# Patient Record
Sex: Male | Born: 1950 | Race: White | Hispanic: No | Smoking: Current some day smoker
Health system: Southern US, Community
[De-identification: ages and names within clinical notes are randomized; demographics above are authoritative.]

## PROBLEM LIST (undated history)

## (undated) DIAGNOSIS — M545 Low back pain, unspecified: Secondary | ICD-10-CM

## (undated) DIAGNOSIS — I2510 Atherosclerotic heart disease of native coronary artery without angina pectoris: Secondary | ICD-10-CM

## (undated) DIAGNOSIS — E7800 Pure hypercholesterolemia, unspecified: Secondary | ICD-10-CM

## (undated) DIAGNOSIS — N390 Urinary tract infection, site not specified: Secondary | ICD-10-CM

## (undated) DIAGNOSIS — E663 Overweight: Secondary | ICD-10-CM

## (undated) DIAGNOSIS — M25569 Pain in unspecified knee: Secondary | ICD-10-CM

## (undated) DIAGNOSIS — G4730 Sleep apnea, unspecified: Secondary | ICD-10-CM

## (undated) HISTORY — DX: Sleep apnea, unspecified: G47.30

## (undated) HISTORY — DX: Pain in unspecified knee: M25.569

## (undated) HISTORY — DX: Pure hypercholesterolemia, unspecified: E78.00

## (undated) HISTORY — DX: Urinary tract infection, site not specified: N39.0

## (undated) HISTORY — DX: Low back pain, unspecified: M54.50

## (undated) HISTORY — DX: Overweight: E66.3

## (undated) HISTORY — DX: Atherosclerotic heart disease of native coronary artery without angina pectoris: I25.10

## (undated) HISTORY — DX: Low back pain: M54.5

---

## 2017-07-15 DIAGNOSIS — E7800 Pure hypercholesterolemia, unspecified: Secondary | ICD-10-CM | POA: Diagnosis not present

## 2017-07-15 DIAGNOSIS — Z23 Encounter for immunization: Secondary | ICD-10-CM | POA: Diagnosis not present

## 2017-07-15 DIAGNOSIS — R0789 Other chest pain: Secondary | ICD-10-CM | POA: Diagnosis not present

## 2017-07-15 DIAGNOSIS — G4730 Sleep apnea, unspecified: Secondary | ICD-10-CM | POA: Diagnosis not present

## 2017-07-15 DIAGNOSIS — M545 Low back pain: Secondary | ICD-10-CM | POA: Diagnosis not present

## 2017-07-15 DIAGNOSIS — M542 Cervicalgia: Secondary | ICD-10-CM | POA: Diagnosis not present

## 2017-07-15 DIAGNOSIS — F17210 Nicotine dependence, cigarettes, uncomplicated: Secondary | ICD-10-CM | POA: Diagnosis not present

## 2017-07-15 DIAGNOSIS — E663 Overweight: Secondary | ICD-10-CM | POA: Diagnosis not present

## 2017-07-15 DIAGNOSIS — I2510 Atherosclerotic heart disease of native coronary artery without angina pectoris: Secondary | ICD-10-CM | POA: Diagnosis not present

## 2017-09-13 DIAGNOSIS — R112 Nausea with vomiting, unspecified: Secondary | ICD-10-CM | POA: Diagnosis not present

## 2017-09-13 DIAGNOSIS — R319 Hematuria, unspecified: Secondary | ICD-10-CM | POA: Diagnosis not present

## 2017-09-13 DIAGNOSIS — Z7982 Long term (current) use of aspirin: Secondary | ICD-10-CM | POA: Diagnosis not present

## 2017-09-13 DIAGNOSIS — Z9049 Acquired absence of other specified parts of digestive tract: Secondary | ICD-10-CM | POA: Diagnosis not present

## 2017-09-13 DIAGNOSIS — Z79899 Other long term (current) drug therapy: Secondary | ICD-10-CM | POA: Diagnosis not present

## 2017-09-13 DIAGNOSIS — K649 Unspecified hemorrhoids: Secondary | ICD-10-CM | POA: Diagnosis not present

## 2017-09-13 DIAGNOSIS — I2510 Atherosclerotic heart disease of native coronary artery without angina pectoris: Secondary | ICD-10-CM | POA: Diagnosis not present

## 2017-09-13 DIAGNOSIS — R1012 Left upper quadrant pain: Secondary | ICD-10-CM | POA: Diagnosis not present

## 2017-09-13 DIAGNOSIS — R3129 Other microscopic hematuria: Secondary | ICD-10-CM | POA: Diagnosis not present

## 2017-09-13 DIAGNOSIS — I252 Old myocardial infarction: Secondary | ICD-10-CM | POA: Diagnosis not present

## 2017-09-13 DIAGNOSIS — R197 Diarrhea, unspecified: Secondary | ICD-10-CM | POA: Diagnosis not present

## 2017-09-13 DIAGNOSIS — F17210 Nicotine dependence, cigarettes, uncomplicated: Secondary | ICD-10-CM | POA: Diagnosis not present

## 2017-09-30 ENCOUNTER — Other Ambulatory Visit: Payer: Self-pay

## 2017-09-30 NOTE — Patient Outreach (Signed)
[facility] Care Management  09/30/2017  Stephen Huerta Jan 07, 1951 [id number]   Telephone call to review health risk assessment and screen for care management needs for  [employer]. Member states he is still working and he is to see his primary care MD in March for a physical.  States he is still smoking but would like some information on the [employer] program. Plan to mail information sheet on [employer] program to member  Member assessed with no further case management needs identified. Dudley MajorMelissa Briona Korpela RN, [facility]N,CCM Care Management Coordinator [facility] Care [facility] [phone]

## 2017-10-01 DIAGNOSIS — N5201 Erectile dysfunction due to arterial insufficiency: Secondary | ICD-10-CM | POA: Diagnosis not present

## 2017-10-01 DIAGNOSIS — R3129 Other microscopic hematuria: Secondary | ICD-10-CM | POA: Diagnosis not present

## 2017-10-01 DIAGNOSIS — Z125 Encounter for screening for malignant neoplasm of prostate: Secondary | ICD-10-CM | POA: Diagnosis not present

## 2017-10-29 DIAGNOSIS — R3129 Other microscopic hematuria: Secondary | ICD-10-CM | POA: Diagnosis not present

## 2017-12-02 DIAGNOSIS — R351 Nocturia: Secondary | ICD-10-CM | POA: Insufficient documentation

## 2017-12-02 DIAGNOSIS — R3916 Straining to void: Secondary | ICD-10-CM | POA: Diagnosis not present

## 2017-12-02 DIAGNOSIS — R310 Gross hematuria: Secondary | ICD-10-CM | POA: Diagnosis not present

## 2018-01-02 DIAGNOSIS — I252 Old myocardial infarction: Secondary | ICD-10-CM | POA: Diagnosis not present

## 2018-01-02 DIAGNOSIS — N400 Enlarged prostate without lower urinary tract symptoms: Secondary | ICD-10-CM | POA: Diagnosis not present

## 2018-01-02 DIAGNOSIS — N50812 Left testicular pain: Secondary | ICD-10-CM | POA: Diagnosis not present

## 2018-01-02 DIAGNOSIS — G4730 Sleep apnea, unspecified: Secondary | ICD-10-CM | POA: Diagnosis not present

## 2018-01-02 DIAGNOSIS — F17210 Nicotine dependence, cigarettes, uncomplicated: Secondary | ICD-10-CM | POA: Diagnosis not present

## 2018-01-02 DIAGNOSIS — R9431 Abnormal electrocardiogram [ECG] [EKG]: Secondary | ICD-10-CM | POA: Diagnosis not present

## 2018-01-02 DIAGNOSIS — R1032 Left lower quadrant pain: Secondary | ICD-10-CM | POA: Diagnosis not present

## 2018-01-02 DIAGNOSIS — Z808 Family history of malignant neoplasm of other organs or systems: Secondary | ICD-10-CM | POA: Diagnosis not present

## 2018-01-02 DIAGNOSIS — Z9049 Acquired absence of other specified parts of digestive tract: Secondary | ICD-10-CM | POA: Diagnosis not present

## 2018-01-02 DIAGNOSIS — Z8349 Family history of other endocrine, nutritional and metabolic diseases: Secondary | ICD-10-CM | POA: Diagnosis not present

## 2018-01-02 DIAGNOSIS — Z8719 Personal history of other diseases of the digestive system: Secondary | ICD-10-CM | POA: Diagnosis not present

## 2018-01-02 DIAGNOSIS — Z833 Family history of diabetes mellitus: Secondary | ICD-10-CM | POA: Diagnosis not present

## 2018-01-02 DIAGNOSIS — I2510 Atherosclerotic heart disease of native coronary artery without angina pectoris: Secondary | ICD-10-CM | POA: Diagnosis not present

## 2018-01-02 DIAGNOSIS — N200 Calculus of kidney: Secondary | ICD-10-CM | POA: Diagnosis not present

## 2018-01-07 DIAGNOSIS — R310 Gross hematuria: Secondary | ICD-10-CM | POA: Diagnosis not present

## 2018-01-27 DIAGNOSIS — M545 Low back pain: Secondary | ICD-10-CM | POA: Diagnosis not present

## 2018-01-27 DIAGNOSIS — G4730 Sleep apnea, unspecified: Secondary | ICD-10-CM | POA: Diagnosis not present

## 2018-01-27 DIAGNOSIS — I2510 Atherosclerotic heart disease of native coronary artery without angina pectoris: Secondary | ICD-10-CM | POA: Diagnosis not present

## 2018-01-27 DIAGNOSIS — Z125 Encounter for screening for malignant neoplasm of prostate: Secondary | ICD-10-CM | POA: Diagnosis not present

## 2018-01-27 DIAGNOSIS — E7800 Pure hypercholesterolemia, unspecified: Secondary | ICD-10-CM | POA: Diagnosis not present

## 2018-01-27 DIAGNOSIS — M25569 Pain in unspecified knee: Secondary | ICD-10-CM | POA: Diagnosis not present

## 2018-01-27 DIAGNOSIS — F17210 Nicotine dependence, cigarettes, uncomplicated: Secondary | ICD-10-CM | POA: Diagnosis not present

## 2018-01-27 DIAGNOSIS — N390 Urinary tract infection, site not specified: Secondary | ICD-10-CM | POA: Diagnosis not present

## 2018-02-03 DIAGNOSIS — M25561 Pain in right knee: Secondary | ICD-10-CM | POA: Diagnosis not present

## 2018-02-03 DIAGNOSIS — M545 Low back pain: Secondary | ICD-10-CM | POA: Diagnosis not present

## 2018-02-05 DIAGNOSIS — M545 Low back pain: Secondary | ICD-10-CM | POA: Diagnosis not present

## 2018-02-07 DIAGNOSIS — M545 Low back pain: Secondary | ICD-10-CM | POA: Diagnosis not present

## 2018-02-11 DIAGNOSIS — M545 Low back pain: Secondary | ICD-10-CM | POA: Diagnosis not present

## 2018-02-17 DIAGNOSIS — M545 Low back pain: Secondary | ICD-10-CM | POA: Diagnosis not present

## 2018-02-19 DIAGNOSIS — M545 Low back pain: Secondary | ICD-10-CM | POA: Diagnosis not present

## 2018-02-24 DIAGNOSIS — M545 Low back pain: Secondary | ICD-10-CM | POA: Diagnosis not present

## 2018-02-25 ENCOUNTER — Ambulatory Visit: Payer: PPO | Admitting: Physician Assistant

## 2018-02-25 ENCOUNTER — Encounter: Payer: Self-pay | Admitting: Physician Assistant

## 2018-02-25 ENCOUNTER — Other Ambulatory Visit: Payer: Self-pay

## 2018-02-25 ENCOUNTER — Encounter (INDEPENDENT_AMBULATORY_CARE_PROVIDER_SITE_OTHER): Payer: Self-pay

## 2018-02-25 ENCOUNTER — Ambulatory Visit (HOSPITAL_COMMUNITY): Payer: PPO | Attending: Cardiovascular Disease

## 2018-02-25 VITALS — Ht 70.5 in

## 2018-02-25 DIAGNOSIS — Z720 Tobacco use: Secondary | ICD-10-CM | POA: Insufficient documentation

## 2018-02-25 DIAGNOSIS — G4733 Obstructive sleep apnea (adult) (pediatric): Secondary | ICD-10-CM | POA: Insufficient documentation

## 2018-02-25 DIAGNOSIS — I2510 Atherosclerotic heart disease of native coronary artery without angina pectoris: Secondary | ICD-10-CM

## 2018-02-25 DIAGNOSIS — R0600 Dyspnea, unspecified: Secondary | ICD-10-CM | POA: Diagnosis not present

## 2018-02-25 DIAGNOSIS — R600 Localized edema: Secondary | ICD-10-CM | POA: Diagnosis not present

## 2018-02-25 DIAGNOSIS — E785 Hyperlipidemia, unspecified: Secondary | ICD-10-CM | POA: Insufficient documentation

## 2018-02-25 LAB — ECHOCARDIOGRAM COMPLETE: Height: 70.5 in

## 2018-02-25 MED ORDER — FUROSEMIDE 20 MG PO TABS: 20.0000 mg | ORAL_TABLET | Freq: Every day | ORAL | 1 refills | Status: DC | PRN

## 2018-02-25 MED ORDER — ATORVASTATIN CALCIUM 40 MG PO TABS: 40.0000 mg | ORAL_TABLET | Freq: Every day | ORAL | 3 refills | Status: DC

## 2018-02-25 MED ORDER — ATORVASTATIN CALCIUM 80 MG PO TABS: 80.0000 mg | ORAL_TABLET | Freq: Every day | ORAL | 3 refills | Status: DC

## 2018-02-25 MED ORDER — NITROGLYCERIN 0.4 MG SL SUBL: 0.4000 mg | SUBLINGUAL_TABLET | SUBLINGUAL | 3 refills | Status: AC | PRN

## 2018-02-25 NOTE — Progress Notes (Signed)
Cardiology Office Note    Date:  02/25/2018   ID:  Stephen Huerta, DOB 06/18/1951, MRN [id number]  PCP:  Stephen Dills, MD Cardiologist:  New to Dr. Elberta Huerta   Chief Complaint: Establish cardiac care  History of Present Illness:   Stephen Huerta is a 67 y.o. male with hx of CAD, HLD, OSA not on CPAP, obesity and ongoing tobacco smoking referred by Dr. Nehemiah Huerta to establish cardiac care.   Personally reviewed recent lab work. Will scan. LDL 130. HDL 23 (patient says this is non fasting).  Takes lipitor 40mg  qd and ASA 81mg  qd daily.   Here today to establish cardiac care. Hx of MI x 2. First occurred in 10/2007 s/p stenting x 1 at [place]. Second 10/2012 s/p stenting x 3 at [facility]. Says mild heart muscle damage from prior MI. Records requested. No regular exercise due to R knee pain and bulging disk. Followed by Dr. Eulah Pont. Likely upcoming surgery. No chest pain, shortens of breath, palpitations, dizziness, orthopnea. He suddenly wakes up at night not able to breath. Denies edema but noted by exam. Compliant with low sodium diet. He will get CPAP next week. Has day time tiredness and fatigue. Denies symptoms similar to prior angina. Moved to [facility] 03/2017. 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   No family hx of CAD. 2 sister has hx of HLD. Currently smokes 1-1 1/2 pack cigarette/day. Had tried to quit multiple times but not successful.   Past Medical History:  Diagnosis Date  . CAD (coronary artery disease)    STENT X2  . High cholesterol   . Knee pain   . Low back pain   . Lower urinary tract infectious disease   . Overweight   . Pure hypercholesterolemia   . Sleep apnea     History reviewed. No pertinent surgical history.  Current Medications: Prior to Admission medications   Medication Sig Start Date End Date Taking? Authorizing Provider  aspirin EC 81 MG tablet Take 81 mg by mouth daily.    [provider]  atorvastatin  (LIPITOR) 40 MG tablet Take 40 mg by mouth daily.    [provider]    Allergies:   Patient has no known allergies.   Social History   Socioeconomic History  . Marital status: Divorced    Spouse name: Not on file  . Number of children: Not on file  . Years of education: Not on file  . Highest education level: Not on file  Occupational History  . Not on file  Social Needs  . Financial resource strain: Not on file  . Food insecurity:    Worry: Not on file    Inability: Not on file  . Transportation needs:    Medical: Not on file    Non-medical: Not on file  Tobacco Use  . Smoking status: Current Some Day Smoker  . Smokeless tobacco: Never Used  Substance and Sexual Activity  . Alcohol use: Yes  . Drug use: Not on file  . Sexual activity: Not on file  Lifestyle  . Physical activity:    Days per week: Not on file    Minutes per session: Not on file  . Stress: Not on file  Relationships  . Social connections:    Talks on phone: Not on file    Gets together: Not on file    Attends religious service: Not on file    Active member of club or organization: Not  on file    Attends meetings of clubs or organizations: Not on file    Relationship status: Not on file  Other Topics Concern  . Not on file  Social History Narrative  . Not on file     Family History:  The patient's family history includes Aneurysm in his father; Dementia in his mother; Hypertension in his sister. as above.   ROS:   Please see the history of present illness.    ROS All other systems reviewed and are negative.   PHYSICAL EXAM:   VS:  Ht 5' 10.5" (1.791 m)    GEN: Well nourished, well developed, in no acute distress  HEENT: normal  Neck: no JVD, carotid bruits, or masses Cardiac: RRR; no murmurs, rubs, or gallops,no edema  Respiratory:  clear to auscultation bilaterally, normal work of breathing GI: soft, nontender, nondistended, + BS MS: no deformity or atrophy  Skin: warm and  dry, no rash Neuro:  Alert and Oriented x 3, Strength and sensation are intact Psych: euthymic mood, full affect  Wt Readings from Last 3 Encounters:  No data found for Wt     Studies/Labs Reviewed:   EKG:  EKG is ordered today.  The ekg ordered today demonstrates NSR at rate of 63 bpm. TWI in lead V5 & V6.   Recent Labs: No results found for requested labs within last 8760 hours.   Lipid Panel No results found for: CHOL, TRIG, HDL, CHOLHDL, VLDL, LDLCALC, LDLDIRECT  Additional studies/ records that were reviewed today include:   As above  ASSESSMENT & PLAN:    1. CAD with prior to hx of MI in 2009 & 2014 - Denies any angina or CHF symptoms. EKG with TWI in V5 & V6. No prior EKG to compare. Given lack of angina symptoms will defer ischemic evaluation currently. Continue ASA and statin.   2. HLD - recent non fasting LDL was 130. LDL goal less than 70. Continue Lipitor 40mg  qd for now. Check fasting lipids.   3. OSA - Waiting for CPAP. He is symptomatic.   4. PND/LE edema  - Could be due to sleep apnea vs volume overload. Can not r/o diastolic dysfunction given body habituate. Will get echo. PRN lasix. Compliant with low sodium diet. Elevate legs.   5. Tobacco smoking - Previously unsuccessful on multiple trial with different regimen. Encouraged cut back and cessation. He will try.  Plan reviewed with DOD Dr. Elberta Fortisamnitz. Will send message to team MD for long term follow up.    Medication Adjustments/Labs and Tests Ordered: Current medicines are reviewed at length with the patient today.  Concerns regarding medicines are outlined above.  Medication changes, Labs and Tests ordered today are listed in the Patient Instructions below. Patient Instructions  Medication Instructions:  1.  START Lasix 20 mg taking only as needed for edema 2.  START Nitroglycerin 0.4 s/l tables USE AS DIRECTED ON THE BOTTLE  Labwork: 02/26/18:   FASTING LIPID    Testing/Procedures: Your  physician has requested that you have an echocardiogram TODAY:  ARRIVE AT 2:45 FOR REGISTRATION.  Echocardiography is a painless test that uses sound waves to create images of your heart. It provides your doctor with information about the size and shape of your heart and how well your heart's chambers and valves are working. This procedure takes approximately one hour. There are no restrictions for this procedure.   Follow-Up: Your physician recommends that you schedule a follow-up appointment in: WE WILL CONTACT YOU  Any Other Special Instructions Will Be Listed Below (If Applicable).  Echocardiogram An echocardiogram, or echocardiography, uses sound waves (ultrasound) to produce an image of your heart. The echocardiogram is simple, painless, obtained within a short period of time, and offers valuable information to your health care provider. The images from an echocardiogram can provide information such as:  Evidence of coronary artery disease (CAD).  Heart size.  Heart muscle function.  Heart valve function.  Aneurysm detection.  Evidence of a past heart attack.  Fluid buildup around the heart.  Heart muscle thickening.  Assess heart valve function.  Tell a health care provider about:  Any allergies you have.  All medicines you are taking, including vitamins, herbs, eye drops, creams, and over-the-counter medicines.  Any problems you or family members have had with anesthetic medicines.  Any blood disorders you have.  Any surgeries you have had.  Any medical conditions you have.  Whether you are pregnant or may be pregnant. What happens before the procedure? No special preparation is needed. Eat and drink normally. What happens during the procedure?  In order to produce an image of your heart, gel will be applied to your chest and a wand-like tool (transducer) will be moved over your chest. The gel will help transmit the sound waves from the transducer. The sound  waves will harmlessly bounce off your heart to allow the heart images to be captured in real-time motion. These images will then be recorded.  You may need an IV to receive a medicine that improves the quality of the pictures. What happens after the procedure? You may return to your normal schedule including diet, activities, and medicines, unless your health care provider tells you otherwise. This information is not intended to replace advice given to you by your health care provider. Make sure you discuss any questions you have with your health care provider. Document Released: 08/10/2000 Document Revised: 03/31/2016 Document Reviewed: 04/20/2013 Elsevier Interactive Patient Education  2017 [employer].   If you need a refill on your cardiac medications before your next appointment, please call your pharmacy.      Lorelei Pont, [place]  02/25/2018 10:57 AM    [facility] [address] [place], [place], [place]  [address] Phone: [phone]; Fax: [phone]

## 2018-02-25 NOTE — Patient Instructions (Addendum)
Medication Instructions:  1.  START Lasix 20 mg taking only as needed for edema 2.  START Nitroglycerin 0.4 s/l tables USE AS DIRECTED ON THE BOTTLE  Labwork: 02/26/18:   FASTING LIPID    Testing/Procedures: Your physician has requested that you have an echocardiogram TODAY:  ARRIVE AT 2:45 FOR REGISTRATION.  Echocardiography is a painless test that uses sound waves to create images of your heart. It provides your doctor with information about the size and shape of your heart and how well your heart's chambers and valves are working. This procedure takes approximately one hour. There are no restrictions for this procedure.   Follow-Up: Your physician recommends that you schedule a follow-up appointment in: WE WILL CONTACT YOU   Any Other Special Instructions Will Be Listed Below (If Applicable).  Echocardiogram An echocardiogram, or echocardiography, uses sound waves (ultrasound) to produce an image of your heart. The echocardiogram is simple, painless, obtained within a short period of time, and offers valuable information to your health care provider. The images from an echocardiogram can provide information such as:  Evidence of coronary artery disease (CAD).  Heart size.  Heart muscle function.  Heart valve function.  Aneurysm detection.  Evidence of a past heart attack.  Fluid buildup around the heart.  Heart muscle thickening.  Assess heart valve function.  Tell a health care provider about:  Any allergies you have.  All medicines you are taking, including vitamins, herbs, eye drops, creams, and over-the-counter medicines.  Any problems you or family members have had with anesthetic medicines.  Any blood disorders you have.  Any surgeries you have had.  Any medical conditions you have.  Whether you are pregnant or may be pregnant. What happens before the procedure? No special preparation is needed. Eat and drink normally. What happens during the  procedure?  In order to produce an image of your heart, gel will be applied to your chest and a wand-like tool (transducer) will be moved over your chest. The gel will help transmit the sound waves from the transducer. The sound waves will harmlessly bounce off your heart to allow the heart images to be captured in real-time motion. These images will then be recorded.  You may need an IV to receive a medicine that improves the quality of the pictures. What happens after the procedure? You may return to your normal schedule including diet, activities, and medicines, unless your health care provider tells you otherwise. This information is not intended to replace advice given to you by your health care provider. Make sure you discuss any questions you have with your health care provider. Document Released: 08/10/2000 Document Revised: 03/31/2016 Document Reviewed: 04/20/2013 Elsevier Interactive Patient Education  2017 [employer]   If you need a refill on your cardiac medications before your next appointment, please call your pharmacy.

## 2018-02-26 ENCOUNTER — Other Ambulatory Visit: Payer: Self-pay

## 2018-02-26 ENCOUNTER — Other Ambulatory Visit: Payer: PPO | Admitting: *Deleted

## 2018-02-26 DIAGNOSIS — E785 Hyperlipidemia, unspecified: Secondary | ICD-10-CM

## 2018-02-26 LAB — LIPID PANEL
CHOL/HDL RATIO: 6 ratio — AB (ref 0.0–5.0)
CHOLESTEROL TOTAL: 145 mg/dL (ref 100–199)
HDL: 24 mg/dL — AB (ref >39)
LDL CALC: 99 mg/dL (ref 0–99)
TRIGLYCERIDES: 110 mg/dL (ref 0–149)
VLDL Cholesterol Cal: 22 mg/dL (ref 5–40)

## 2018-02-26 MED ORDER — ATORVASTATIN CALCIUM 80 MG PO TABS: 80.0000 mg | ORAL_TABLET | Freq: Every day | ORAL | 3 refills | Status: AC

## 2018-02-26 NOTE — Progress Notes (Signed)
I can see him

## 2018-02-27 DIAGNOSIS — G4733 Obstructive sleep apnea (adult) (pediatric): Secondary | ICD-10-CM | POA: Diagnosis not present

## 2018-03-05 DIAGNOSIS — M545 Low back pain: Secondary | ICD-10-CM | POA: Diagnosis not present

## 2018-03-06 DIAGNOSIS — H02889 Meibomian gland dysfunction of unspecified eye, unspecified eyelid: Secondary | ICD-10-CM | POA: Diagnosis not present

## 2018-03-06 DIAGNOSIS — H35362 Drusen (degenerative) of macula, left eye: Secondary | ICD-10-CM | POA: Diagnosis not present

## 2018-03-06 DIAGNOSIS — H2513 Age-related nuclear cataract, bilateral: Secondary | ICD-10-CM | POA: Diagnosis not present

## 2018-03-06 DIAGNOSIS — H5203 Hypermetropia, bilateral: Secondary | ICD-10-CM | POA: Diagnosis not present

## 2018-03-06 DIAGNOSIS — H35361 Drusen (degenerative) of macula, right eye: Secondary | ICD-10-CM | POA: Diagnosis not present

## 2018-03-10 DIAGNOSIS — M545 Low back pain: Secondary | ICD-10-CM | POA: Diagnosis not present

## 2018-03-13 DIAGNOSIS — G4733 Obstructive sleep apnea (adult) (pediatric): Secondary | ICD-10-CM | POA: Diagnosis not present

## 2018-03-17 DIAGNOSIS — M25562 Pain in left knee: Secondary | ICD-10-CM | POA: Diagnosis not present

## 2018-03-17 DIAGNOSIS — M25561 Pain in right knee: Secondary | ICD-10-CM | POA: Diagnosis not present

## 2018-03-19 ENCOUNTER — Telehealth: Payer: Self-pay | Admitting: *Deleted

## 2018-03-19 DIAGNOSIS — M25561 Pain in right knee: Secondary | ICD-10-CM | POA: Diagnosis not present

## 2018-03-19 NOTE — Telephone Encounter (Signed)
   [facility] Pre-operative Risk Assessment    Request for surgical clearance:  1. What type of surgery is being performed? Right knee scope   2. When is this surgery scheduled? pending   3. What type of clearance is required (medical clearance vs. Pharmacy clearance to hold med vs. Both)? Medical  4. Are there any medications that need to be held prior to surgery and how long?n/a   5. Practice name and name of physician performing surgery? [facility] Percell Miller  6. What is your office phone number [phone] ext [phone] Sherri    7.   What is your office fax number [phone] Atn Sherri  8.   Anesthesia type (None, local, MAC, general) ? general   Please send recent notes EKG and labs    Janan Halter 03/19/2018, 5:00 PM  _________________________________________________________________   (provider comments below)  '

## 2018-03-21 NOTE — Telephone Encounter (Signed)
I spoke with Stephen Huerta- no cardiac complaints. OK to hold ASA pre op if needed.  [name] Vue Pavon PA-C 03/21/2018 11:16 AM

## 2018-03-21 NOTE — Telephone Encounter (Signed)
   Primary Cardiologist:  [facility]  Chart reviewed as part of pre-operative protocol coverage. Given past medical history and time since last visit, based on ACC/AHA guidelines, Stephen Huerta would be at acceptable risk for the planned procedure without further cardiovascular testing.   I will route this recommendation to the requesting party via Epic fax function and remove from pre-op pool.  Please call with questions.  [name] Nikolaos Maddocks, PA-C 03/21/2018, 11:10 AM

## 2018-03-27 DIAGNOSIS — H02889 Meibomian gland dysfunction of unspecified eye, unspecified eyelid: Secondary | ICD-10-CM | POA: Diagnosis not present

## 2018-03-27 DIAGNOSIS — M47816 Spondylosis without myelopathy or radiculopathy, lumbar region: Secondary | ICD-10-CM | POA: Diagnosis not present

## 2018-03-27 DIAGNOSIS — M5136 Other intervertebral disc degeneration, lumbar region: Secondary | ICD-10-CM | POA: Diagnosis not present

## 2018-03-27 DIAGNOSIS — M545 Low back pain: Secondary | ICD-10-CM | POA: Diagnosis not present

## 2018-03-27 DIAGNOSIS — H04129 Dry eye syndrome of unspecified lacrimal gland: Secondary | ICD-10-CM | POA: Diagnosis not present

## 2018-03-28 ENCOUNTER — Telehealth: Payer: Self-pay | Admitting: *Deleted

## 2018-03-28 DIAGNOSIS — M545 Low back pain: Secondary | ICD-10-CM | POA: Diagnosis not present

## 2018-03-28 NOTE — Telephone Encounter (Signed)
Called pt to let him know that it was ok'd by Chelsea AusVin Bhagat, PA-C for him to take Meloxicam 7.5 mg daily. Pt thanked me for the call.

## 2018-03-28 NOTE — Telephone Encounter (Signed)
-----   Message from MeromBhavinkumar Bhagat, [place] sent at 03/28/2018  1:52 PM EDT ----- Regarding: RE: new medication He can take Meloxicam.  ----- Message ----- From: Elliot CousinWitty, Tamkia Temples, RMA Sent: 03/28/2018   1:20 PM To: Manson PasseyBhavinkumar Bhagat, [place] Subject: FW: new medication                               ----- Message ----- From: Royann ShiversWalters, Larrie L Sent: 03/28/2018   1:02 PM To: Lendon KaMichalene Wilson, RN, Elliot CousinJennifer Ranetta Armacost, RMA Subject: new medication                                 Mr. Marco Colliestorino has seen Mr. Iver NestleBhagat but has not seen his new cardiologist, Dr. Tenny Crawoss.  His orthopaedic doctor, Dr. Maurice SmallIbazebo would like to know if it is okay for patient to begin taking Meloxicam 7.5mg , 1 p.o qd for 30 to 60 days.   Please f/u w/ Dr. Maurice SmallIbazebo and patient regarding this.

## 2018-04-13 DIAGNOSIS — G4733 Obstructive sleep apnea (adult) (pediatric): Secondary | ICD-10-CM | POA: Diagnosis not present

## 2018-04-17 DIAGNOSIS — G4733 Obstructive sleep apnea (adult) (pediatric): Secondary | ICD-10-CM | POA: Diagnosis not present

## 2018-04-24 ENCOUNTER — Telehealth: Payer: Self-pay | Admitting: Internal Medicine

## 2018-04-24 NOTE — Telephone Encounter (Signed)
New Message       Patient needs to clarify if he needs this appointment on  04/30/2018 for labs? He is suppose to be a new patient for Dr. Tenny Crawoss on 05/30/2018, he has also seen "Bhagat in July of this year, Pls advise.

## 2018-04-24 NOTE — Telephone Encounter (Signed)
Called patient back to let him know that his appt in September is only for lab work to check his lipid and liver due to increase in Lipitor. Encouraged patient to keep appt with Dr. Tenny Crawoss in October. Patient verbalized understanding.

## 2018-04-29 ENCOUNTER — Other Ambulatory Visit: Payer: PPO | Admitting: *Deleted

## 2018-04-29 DIAGNOSIS — R0981 Nasal congestion: Secondary | ICD-10-CM | POA: Diagnosis not present

## 2018-04-29 DIAGNOSIS — J209 Acute bronchitis, unspecified: Secondary | ICD-10-CM | POA: Diagnosis not present

## 2018-04-29 DIAGNOSIS — E785 Hyperlipidemia, unspecified: Secondary | ICD-10-CM | POA: Diagnosis not present

## 2018-04-29 DIAGNOSIS — F17210 Nicotine dependence, cigarettes, uncomplicated: Secondary | ICD-10-CM | POA: Diagnosis not present

## 2018-04-29 LAB — HEPATIC FUNCTION PANEL
ALT: 33 IU/L (ref 0–44)
AST: 20 IU/L (ref 0–40)
Albumin: 4 g/dL (ref 3.6–4.8)
Alkaline Phosphatase: 72 IU/L (ref 39–117)
BILIRUBIN TOTAL: 0.3 mg/dL (ref 0.0–1.2)
BILIRUBIN, DIRECT: 0.11 mg/dL (ref 0.00–0.40)
TOTAL PROTEIN: 6.6 g/dL (ref 6.0–8.5)

## 2018-04-29 LAB — LIPID PANEL
CHOL/HDL RATIO: 5.5 ratio — AB (ref 0.0–5.0)
CHOLESTEROL TOTAL: 116 mg/dL (ref 100–199)
HDL: 21 mg/dL — AB (ref >39)
LDL Calculated: 65 mg/dL (ref 0–99)
Triglycerides: 148 mg/dL (ref 0–149)
VLDL CHOLESTEROL CAL: 30 mg/dL (ref 5–40)

## 2018-05-08 DIAGNOSIS — S83251A Bucket-handle tear of lateral meniscus, current injury, right knee, initial encounter: Secondary | ICD-10-CM | POA: Diagnosis not present

## 2018-05-08 DIAGNOSIS — Y999 Unspecified external cause status: Secondary | ICD-10-CM | POA: Diagnosis not present

## 2018-05-08 DIAGNOSIS — S83241A Other tear of medial meniscus, current injury, right knee, initial encounter: Secondary | ICD-10-CM | POA: Diagnosis not present

## 2018-05-08 DIAGNOSIS — S83221A Peripheral tear of medial meniscus, current injury, right knee, initial encounter: Secondary | ICD-10-CM | POA: Diagnosis not present

## 2018-05-08 DIAGNOSIS — S83281A Other tear of lateral meniscus, current injury, right knee, initial encounter: Secondary | ICD-10-CM | POA: Diagnosis not present

## 2018-05-08 DIAGNOSIS — X58XXXA Exposure to other specified factors, initial encounter: Secondary | ICD-10-CM | POA: Diagnosis not present

## 2018-05-08 DIAGNOSIS — G8918 Other acute postprocedural pain: Secondary | ICD-10-CM | POA: Diagnosis not present

## 2018-05-08 DIAGNOSIS — M94261 Chondromalacia, right knee: Secondary | ICD-10-CM | POA: Diagnosis not present

## 2018-05-14 DIAGNOSIS — G4733 Obstructive sleep apnea (adult) (pediatric): Secondary | ICD-10-CM | POA: Diagnosis not present

## 2018-05-19 DIAGNOSIS — S83241D Other tear of medial meniscus, current injury, right knee, subsequent encounter: Secondary | ICD-10-CM | POA: Diagnosis not present

## 2018-05-19 DIAGNOSIS — G4733 Obstructive sleep apnea (adult) (pediatric): Secondary | ICD-10-CM | POA: Diagnosis not present

## 2018-05-19 DIAGNOSIS — S83281D Other tear of lateral meniscus, current injury, right knee, subsequent encounter: Secondary | ICD-10-CM | POA: Diagnosis not present

## 2018-05-27 DIAGNOSIS — M545 Low back pain: Secondary | ICD-10-CM | POA: Diagnosis not present

## 2018-05-29 ENCOUNTER — Other Ambulatory Visit: Payer: Self-pay | Admitting: Neurological Surgery

## 2018-05-29 DIAGNOSIS — M545 Low back pain, unspecified: Secondary | ICD-10-CM

## 2018-05-30 ENCOUNTER — Ambulatory Visit: Payer: PPO | Admitting: Internal Medicine

## 2018-06-01 ENCOUNTER — Ambulatory Visit
Admission: RE | Admit: 2018-06-01 | Discharge: 2018-06-01 | Disposition: A | Discharge status: Home / Self Care | Payer: PPO | Source: Ambulatory Visit | Attending: Neurological Surgery | Admitting: Neurological Surgery

## 2018-06-01 DIAGNOSIS — M545 Low back pain, unspecified: Secondary | ICD-10-CM

## 2018-06-02 ENCOUNTER — Other Ambulatory Visit: Payer: PPO

## 2018-06-12 DIAGNOSIS — M25551 Pain in right hip: Secondary | ICD-10-CM | POA: Diagnosis not present

## 2018-06-12 DIAGNOSIS — M25552 Pain in left hip: Secondary | ICD-10-CM | POA: Diagnosis not present

## 2018-06-12 DIAGNOSIS — M545 Low back pain: Secondary | ICD-10-CM | POA: Diagnosis not present

## 2018-06-17 DIAGNOSIS — G4733 Obstructive sleep apnea (adult) (pediatric): Secondary | ICD-10-CM | POA: Diagnosis not present

## 2018-06-19 ENCOUNTER — Telehealth: Payer: Self-pay | Admitting: Nurse Practitioner

## 2018-06-19 ENCOUNTER — Other Ambulatory Visit: Payer: Self-pay | Admitting: Neurological Surgery

## 2018-06-19 DIAGNOSIS — S83282A Other tear of lateral meniscus, current injury, left knee, initial encounter: Secondary | ICD-10-CM | POA: Diagnosis not present

## 2018-06-19 DIAGNOSIS — M545 Low back pain, unspecified: Secondary | ICD-10-CM

## 2018-06-19 DIAGNOSIS — Y999 Unspecified external cause status: Secondary | ICD-10-CM | POA: Diagnosis not present

## 2018-06-19 DIAGNOSIS — S83252A Bucket-handle tear of lateral meniscus, current injury, left knee, initial encounter: Secondary | ICD-10-CM | POA: Diagnosis not present

## 2018-06-19 DIAGNOSIS — M94262 Chondromalacia, left knee: Secondary | ICD-10-CM | POA: Diagnosis not present

## 2018-06-19 DIAGNOSIS — S83232A Complex tear of medial meniscus, current injury, left knee, initial encounter: Secondary | ICD-10-CM | POA: Diagnosis not present

## 2018-06-19 DIAGNOSIS — G8918 Other acute postprocedural pain: Secondary | ICD-10-CM | POA: Diagnosis not present

## 2018-06-19 DIAGNOSIS — S83242A Other tear of medial meniscus, current injury, left knee, initial encounter: Secondary | ICD-10-CM | POA: Diagnosis not present

## 2018-06-19 DIAGNOSIS — G8929 Other chronic pain: Secondary | ICD-10-CM

## 2018-06-19 DIAGNOSIS — X58XXXA Exposure to other specified factors, initial encounter: Secondary | ICD-10-CM | POA: Diagnosis not present

## 2018-06-19 NOTE — Telephone Encounter (Signed)
Phone call to patient to verify medication list and allergies for myelogram appointment. Pt aware he will not need to hold any medications for this procedure.

## 2018-06-23 ENCOUNTER — Encounter: Payer: Self-pay | Admitting: Internal Medicine

## 2018-06-30 DIAGNOSIS — S83242D Other tear of medial meniscus, current injury, left knee, subsequent encounter: Secondary | ICD-10-CM | POA: Diagnosis not present

## 2018-06-30 DIAGNOSIS — S83282D Other tear of lateral meniscus, current injury, left knee, subsequent encounter: Secondary | ICD-10-CM | POA: Diagnosis not present

## 2018-07-01 ENCOUNTER — Ambulatory Visit
Admission: RE | Admit: 2018-07-01 | Discharge: 2018-07-01 | Disposition: A | Discharge status: Home / Self Care | Payer: PPO | Source: Ambulatory Visit | Attending: Neurological Surgery | Admitting: Neurological Surgery

## 2018-07-01 DIAGNOSIS — G8929 Other chronic pain: Secondary | ICD-10-CM

## 2018-07-01 DIAGNOSIS — M48061 Spinal stenosis, lumbar region without neurogenic claudication: Secondary | ICD-10-CM | POA: Diagnosis not present

## 2018-07-01 DIAGNOSIS — M545 Low back pain, unspecified: Secondary | ICD-10-CM

## 2018-07-01 MED ORDER — DIAZEPAM 5 MG PO TABS
5.0000 mg | ORAL_TABLET | Freq: Once | ORAL | Status: AC
  Administered 2018-07-01: 5 mg via ORAL

## 2018-07-01 MED ORDER — IOHEXOL 180 MG/ML  SOLN
20.0000 mL | Freq: Once | INTRAMUSCULAR | Status: AC | PRN
  Administered 2018-07-01: 20 mL via INTRATHECAL

## 2018-07-01 NOTE — Discharge Instructions (Signed)

## 2018-07-07 DIAGNOSIS — M25551 Pain in right hip: Secondary | ICD-10-CM | POA: Diagnosis not present

## 2018-07-07 DIAGNOSIS — I10 Essential (primary) hypertension: Secondary | ICD-10-CM | POA: Diagnosis not present

## 2018-07-07 DIAGNOSIS — Z6841 Body Mass Index (BMI) 40.0 and over, adult: Secondary | ICD-10-CM | POA: Diagnosis not present

## 2018-07-08 ENCOUNTER — Other Ambulatory Visit: Payer: Self-pay | Admitting: Neurological Surgery

## 2018-07-08 DIAGNOSIS — M25551 Pain in right hip: Secondary | ICD-10-CM

## 2018-07-18 DIAGNOSIS — G4733 Obstructive sleep apnea (adult) (pediatric): Secondary | ICD-10-CM | POA: Diagnosis not present

## 2018-08-11 ENCOUNTER — Ambulatory Visit
Admission: RE | Admit: 2018-08-11 | Discharge: 2018-08-11 | Disposition: A | Discharge status: Home / Self Care | Payer: PPO | Source: Ambulatory Visit | Attending: Neurological Surgery | Admitting: Neurological Surgery

## 2018-08-11 DIAGNOSIS — M25551 Pain in right hip: Secondary | ICD-10-CM

## 2018-08-11 DIAGNOSIS — M47817 Spondylosis without myelopathy or radiculopathy, lumbosacral region: Secondary | ICD-10-CM | POA: Diagnosis not present

## 2018-08-11 MED ORDER — IOPAMIDOL (ISOVUE-M 200) INJECTION 41%
1.0000 mL | Freq: Once | INTRAMUSCULAR | Status: AC
  Administered 2018-08-11: 1 mL via EPIDURAL

## 2018-08-11 MED ORDER — METHYLPREDNISOLONE ACETATE 40 MG/ML INJ SUSP (RADIOLOG
120.0000 mg | Freq: Once | INTRAMUSCULAR | Status: AC
  Administered 2018-08-11: 120 mg via EPIDURAL

## 2018-08-11 NOTE — Discharge Instructions (Signed)

## 2018-08-17 DIAGNOSIS — G4733 Obstructive sleep apnea (adult) (pediatric): Secondary | ICD-10-CM | POA: Diagnosis not present

## 2018-09-17 DIAGNOSIS — G4733 Obstructive sleep apnea (adult) (pediatric): Secondary | ICD-10-CM | POA: Diagnosis not present

## 2018-09-22 ENCOUNTER — Telehealth: Payer: Self-pay | Admitting: Internal Medicine

## 2018-09-22 NOTE — Telephone Encounter (Signed)
New Message         Patient would like labs done before his appt. He was scheduled as a new patient however; he was seen by Vin in July 2019, so therefore the patient would not be new. Can you get Dr. Tenny Craw to put in a lab order please and thank you.

## 2018-09-23 NOTE — Telephone Encounter (Signed)
Spoke with patient.  Adv his lipids were check in Sept and were at goal. Adv to wait until after visit with Dr. Tenny Craw to see if she recommends other labs at that time. Pt in agreement with this plan.

## 2018-09-29 ENCOUNTER — Encounter (INDEPENDENT_AMBULATORY_CARE_PROVIDER_SITE_OTHER): Payer: Self-pay

## 2018-09-29 ENCOUNTER — Encounter: Payer: Self-pay | Admitting: Internal Medicine

## 2018-09-29 ENCOUNTER — Ambulatory Visit: Payer: PPO | Admitting: Internal Medicine

## 2018-09-29 VITALS — BP 122/66 | HR 80 | Ht 72.0 in | Wt 304.2 lb

## 2018-09-29 DIAGNOSIS — I10 Essential (primary) hypertension: Secondary | ICD-10-CM | POA: Diagnosis not present

## 2018-09-29 DIAGNOSIS — I2510 Atherosclerotic heart disease of native coronary artery without angina pectoris: Secondary | ICD-10-CM | POA: Diagnosis not present

## 2018-09-29 DIAGNOSIS — G4733 Obstructive sleep apnea (adult) (pediatric): Secondary | ICD-10-CM | POA: Diagnosis not present

## 2018-09-29 DIAGNOSIS — E785 Hyperlipidemia, unspecified: Secondary | ICD-10-CM | POA: Diagnosis not present

## 2018-09-29 NOTE — Patient Instructions (Signed)
Medication Instructions:  No changes If you need a refill on your cardiac medications before your next appointment, please call your pharmacy.   Lab work: None today If you have labs (blood work) drawn today and your tests are completely normal, you will receive your results only by: Marland Kitchen MyChart Message (if you have MyChart) OR . A paper copy in the mail If you have any lab test that is abnormal or we need to change your treatment, we will call you to review the results.  Testing/Procedures: none  Follow-Up: Addendum: Pt is planning a move out of town.   If that does not occur, Dr. Tenny Craw will see him back as needed.  Any Other Special Instructions Will Be Listed Below (If Applicable).

## 2018-09-29 NOTE — Progress Notes (Signed)
Cardiology Office Note   Date:  09/29/2018   ID:  Ruffin FrederickDavid J Schillo, DOB 14-Jan-1951, MRN [id number]  PCP:  Renford DillsPolite, Ronald, MD  Cardiologist:   Dietrich PatesPaula Arrion Burruel, MD   F/U of CAD and HTN     History of Present Illness: Ruffin FrederickDavid J Killman is a 68 y.o. male with a history of CAD, HL, OSA (not on CPAP), tobacco abuse,  Seen by B Bhagat in July     Patient has a history of MI x2 first in March 2009.  He is status post [place].  Second heart attack in March 2014, status [place]  Records were not available when he saw B Bhagat at in July.  At that July visit he was started on Lasix as needed (20 mg) for edema.  Again, only as needed.  An echocardiogram was also scheduled.  LVEF was normal  Mild diastolic dysfunction   Lipd panel in Sepbterm LDL 65  HDL was 21    Since seen he has done well from a cardiac standpoint.  Breathing is okay.  He denies chest pain.  The patient lost about 29 pounds in the fall.  Over the Christmas holidays, he regained most of this weight.  He says he feels hungry but always "full".  He says that [place] and is planning to move in the next few weeks.  He will need to establish with cardiology there. Current Meds  Medication Sig  . aspirin EC 81 MG tablet Take 81 mg by mouth daily.  Marland Kitchen. atorvastatin (LIPITOR) 80 MG tablet Take 1 tablet (80 mg total) by mouth daily.     Allergies:   Patient has no known allergies.   Past Medical History:  Diagnosis Date  . CAD (coronary artery disease)    STENT X2  . High cholesterol   . Knee pain   . Low back pain   . Lower urinary tract infectious disease   . Overweight   . Pure hypercholesterolemia   . Sleep apnea     No past surgical history on file.   Social History:  The patient  reports that he has been smoking. He has never used smokeless tobacco. He reports current alcohol use.   Family History:  The patient's family history includes Aneurysm in  his father; Dementia in his mother; Hypertension in his sister.    ROS:  Please see the history of present illness. All other systems are reviewed and  Negative to the above problem except as noted.    PHYSICAL EXAM: VS:  BP 122/66   Pulse 80   Ht 6' (1.829 m)   Wt (!) 304 lb 3.2 oz (138 kg)   SpO2 94%   BMI 41.26 kg/m   GEN: Morbidly obese and in no acute distress  HEENT: normal  Neck: JVP does not appear elevated  No, carotid bruits, or masses Cardiac: RRR; no murmurs, rubs, or gallops,no edema  Respiratory:  clear to auscultation bilaterally, normal work of breathing GI: soft, nontender, nondistended, + BS  No hepatomegaly  MS: no deformity Moving all extremities   Skin: warm and dry, no rash Neuro:  Strength and sensation are intact Psych: euthymic mood, full affect   EKG:  EKG is not  ordered today.   Lipid Panel    Component Value Date/Time   CHOL 116 04/29/2018 0832   TRIG 148 04/29/2018 0832   HDL 21 (L) 04/29/2018 25360832  CHOLHDL 5.5 (H) 04/29/2018 0832   LDLCALC 65 04/29/2018 0832      Wt Readings from Last 3 Encounters:  09/29/18 (!) 304 lb 3.2 oz (138 kg)      ASSESSMENT AND PLAN:  1.  CAD.  The patient is doing well.  I am not convinced of any signs or symptoms to suggest ischemia.  We will still try to get the records from [place].  [facility] in [place].  I can forward these when he calls with his cardiologist near [facility].  2 dyslipidemia.  Last lipid panel LDL was excellent at 60.  HDL was still low at 21 recommended he try to increase his physical activity  3 HTN   BP is good  4  morbid obesity patient dieted this past fall and had a good response.  Unfortunately his habits went back to normal and he regained the weight.  I encouraged him to increase his physical activity through swimming.  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  5  OSA   Keep up with CPAP  Follow-up will be as needed since he is planning to  move.  If things change and he stays locally we will set to see him in the future.   Current medicines are reviewed at length with the patient today.  The patient does not have concerns regarding medicines.  Signed, Dietrich Pates, MD  09/29/2018 10:49 AM    [facility] [address] [place], [place], [place]  [address] Phone: [phone]; Fax: [phone]

## 2018-10-18 DIAGNOSIS — G4733 Obstructive sleep apnea (adult) (pediatric): Secondary | ICD-10-CM | POA: Diagnosis not present

## 2018-10-28 ENCOUNTER — Other Ambulatory Visit: Payer: Self-pay | Admitting: Student

## 2018-10-28 DIAGNOSIS — G8929 Other chronic pain: Secondary | ICD-10-CM

## 2018-10-28 DIAGNOSIS — M545 Low back pain, unspecified: Secondary | ICD-10-CM

## 2018-10-29 ENCOUNTER — Ambulatory Visit
Admission: RE | Admit: 2018-10-29 | Discharge: 2018-10-29 | Disposition: A | Discharge status: Home / Self Care | Payer: PPO | Source: Ambulatory Visit | Attending: Student | Admitting: Student

## 2018-10-29 ENCOUNTER — Other Ambulatory Visit: Payer: Self-pay | Admitting: Student

## 2018-10-29 DIAGNOSIS — M545 Low back pain: Principal | ICD-10-CM

## 2018-10-29 DIAGNOSIS — G8929 Other chronic pain: Secondary | ICD-10-CM

## 2018-10-29 DIAGNOSIS — M5126 Other intervertebral disc displacement, lumbar region: Secondary | ICD-10-CM | POA: Diagnosis not present

## 2018-10-29 MED ORDER — IOPAMIDOL (ISOVUE-M 200) INJECTION 41%: 1.0000 mL | Freq: Once | INTRAMUSCULAR | Status: DC

## 2018-10-29 MED ORDER — METHYLPREDNISOLONE ACETATE 40 MG/ML INJ SUSP (RADIOLOG: 120.0000 mg | Freq: Once | INTRAMUSCULAR | Status: DC

## 2018-10-29 NOTE — Discharge Instructions (Signed)

## 2018-11-19 ENCOUNTER — Other Ambulatory Visit: Payer: Self-pay | Admitting: Neurological Surgery

## 2018-11-19 DIAGNOSIS — M545 Low back pain, unspecified: Secondary | ICD-10-CM

## 2019-01-02 DIAGNOSIS — Z1389 Encounter for screening for other disorder: Secondary | ICD-10-CM | POA: Diagnosis not present

## 2019-01-02 DIAGNOSIS — M48061 Spinal stenosis, lumbar region without neurogenic claudication: Secondary | ICD-10-CM | POA: Diagnosis not present

## 2019-01-02 DIAGNOSIS — R69 Illness, unspecified: Secondary | ICD-10-CM | POA: Diagnosis not present

## 2019-01-02 DIAGNOSIS — G4733 Obstructive sleep apnea (adult) (pediatric): Secondary | ICD-10-CM | POA: Diagnosis not present

## 2019-01-02 DIAGNOSIS — Z0000 Encounter for general adult medical examination without abnormal findings: Secondary | ICD-10-CM | POA: Diagnosis not present

## 2019-01-02 DIAGNOSIS — M545 Low back pain: Secondary | ICD-10-CM | POA: Diagnosis not present

## 2019-01-02 DIAGNOSIS — E7800 Pure hypercholesterolemia, unspecified: Secondary | ICD-10-CM | POA: Diagnosis not present

## 2019-01-02 DIAGNOSIS — R319 Hematuria, unspecified: Secondary | ICD-10-CM | POA: Diagnosis not present

## 2019-01-02 DIAGNOSIS — I2510 Atherosclerotic heart disease of native coronary artery without angina pectoris: Secondary | ICD-10-CM | POA: Diagnosis not present

## 2019-02-05 DIAGNOSIS — M5416 Radiculopathy, lumbar region: Secondary | ICD-10-CM | POA: Diagnosis not present

## 2019-02-05 DIAGNOSIS — M545 Low back pain: Secondary | ICD-10-CM | POA: Diagnosis not present

## 2019-02-13 DIAGNOSIS — M47896 Other spondylosis, lumbar region: Secondary | ICD-10-CM | POA: Diagnosis not present

## 2019-02-13 DIAGNOSIS — M5137 Other intervertebral disc degeneration, lumbosacral region: Secondary | ICD-10-CM | POA: Diagnosis not present

## 2019-02-13 DIAGNOSIS — M5136 Other intervertebral disc degeneration, lumbar region: Secondary | ICD-10-CM | POA: Diagnosis not present

## 2019-02-13 DIAGNOSIS — M5126 Other intervertebral disc displacement, lumbar region: Secondary | ICD-10-CM | POA: Diagnosis not present

## 2019-02-13 DIAGNOSIS — M48061 Spinal stenosis, lumbar region without neurogenic claudication: Secondary | ICD-10-CM | POA: Diagnosis not present

## 2019-02-13 DIAGNOSIS — M47816 Spondylosis without myelopathy or radiculopathy, lumbar region: Secondary | ICD-10-CM | POA: Diagnosis not present

## 2019-02-17 DIAGNOSIS — M47817 Spondylosis without myelopathy or radiculopathy, lumbosacral region: Secondary | ICD-10-CM | POA: Diagnosis not present

## 2019-07-18 IMAGING — CT CT L SPINE W/ CM
1 of 14 series · 1 of 14 positions shown, 2 images · non-contrast
Comparison: [HOSPITAL] MRI lumbar spine 06/01/2018. [facility], MRI lumbar spine 01/31/2016.

CLINICAL DATA: Low back pain. Symptoms worse with walking. Pain
radiates to groins and upper legs.
TECHNIQUE: Contiguous axial images were obtained through the Lumbar spine after
the intrathecal infusion of infusion. Coronal and sagittal
reconstructions were obtained of the axial image sets.

[Series 602: disc · axial · 0.45mm/px · z∈[-100,-100]mm · 1 of 17 slices shown, 2 images]
[im 1/17  soft-tissue]
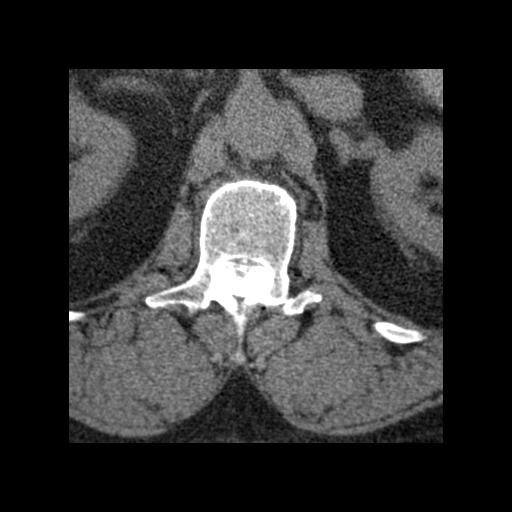
[im 1/17  bone]
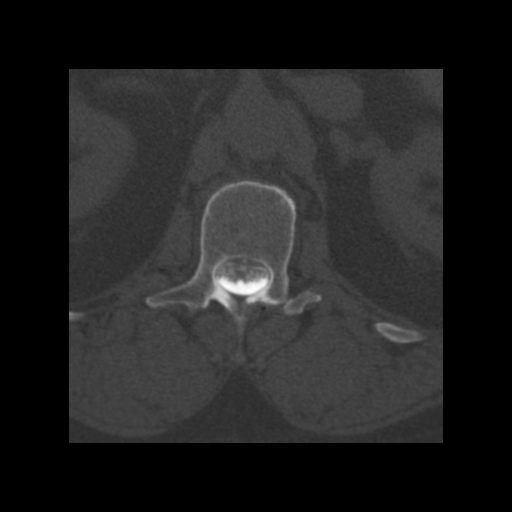

[1 of 14 positions shown; findings below may reference images not displayed]

EXAM:
LUMBAR MYELOGRAM

FLUOROSCOPY TIME:  50 seconds corresponding to a Dose Area Product
of 537.17 Gy*m2

PROCEDURE:
After thorough discussion of risks and benefits of the procedure
including bleeding, infection, injury to nerves, blood vessels,
adjacent structures as well as headache and CSF leak, written and
oral informed consent was obtained. Consent was obtained by Dr. Jose Francisco
Jourdan. Time out form was completed.

Patient was positioned prone on the fluoroscopy table. Local
anesthesia was provided with 1% lidocaine without epinephrine after
prepped and draped in the usual sterile fashion. Puncture was
performed at L3-L4 using a 5 inch 22-gauge spinal needle via midline
approach. Using a single pass through the dura, the needle was
placed within the thecal sac, with return of clear CSF. 15 mL of
Isovue B-IJJ was injected into the thecal sac, with normal
opacification of the nerve roots and cauda equina consistent with
free flow within the subarachnoid space.

I personally performed the lumbar puncture and administered the
intrathecal contrast. I also personally supervised acquisition of
the myelogram images.
FINDINGS: LUMBAR MYELOGRAM FINDINGS:

Good opacification lumbar subarachnoid space. The patient has
undergone previous LEFT L4-5 laminectomy and discectomy. The
alignment is anatomic from L3 through S1. At L1-2 and L2-3, there is
2 mm of facet mediated retrolisthesis. No worrisome osseous lesion.

LEFT L3-4 extradural defect, affecting the LEFT L4 nerve root.
Similar extradural defect at L4-5 on the RIGHT seen only on upright
radiograph. Shallow ventral defect most prominent L3-L4. Calcified
protrusion with osseous spurring projects posteriorly from L1-L2.

With patient upright, the 2 mm of retrolisthesis at L1-2 and L2-3
does not worsen between flexion or extension. However due to
ligamentum flavum infolding, there is significant stenosis at L2-3
and L3-4, and to a lesser degree at L1-2, with patient upright in
extension.

CT LUMBAR MYELOGRAM FINDINGS:

Segmentation: Normal.

Alignment: 2 mm retrolisthesis at L1-2 and L2-3 is redemonstrated
with patient recumbent for CT.

Vertebrae: No worrisome osseous lesion.

Conus medullaris: Normal in size and location, terminating opposite
L1.

Paraspinal tissues: Aortic atherosclerosis. No aneurysm formation.
Transverse diameter 29 mm.

Disc levels:

PLEASE NOTE, THE PATIENT WAS SCANNED BOTH PRONE AND SUPINE TO MORE
ACCURATELY ASSESS VENTRAL PATHOLOGY IN THE UPPER LUMBAR REGIONS.

T12-L1: Unremarkable disc space. Anomalous facet anatomy to the
RIGHT appears to represent a congenital anomaly, not an old
fracture. No subluxation. See axial series 4, image 7 and 8.

L1-L2: Mild disc space narrowing. 2 mm retrolisthesis. Central and
rightward protrusion, partially calcified, minimally eccentric to
the RIGHT. Mild to moderate stenosis. Effacement of the ventral
subarachnoid space. Borderline RIGHT L2 neural impingement.

L2-L3: Mild disc space narrowing. Annular bulge. 2 mm
retrolisthesis. Posterior element hypertrophy. Mild to moderate
stenosis. BILATERAL L3 neural impingement is likely.

L3-L4: Mild disc space narrowing. Vacuum phenomenon. Posterior
element hypertrophy. Mild to moderate stenosis. RIGHT greater than
LEFT L4 and L3 neural impingement. Moderately prominent epidural fat
at L3-4, without features of epidural lipomatosis.

L4-L5: LEFT laminectomy. Preserved disc height. Mild epidural
fibrosis. Facet arthropathy. A RIGHT-sided disc protrusion is not
observed.

L5-S1: Vacuum phenomenon. Preserved disc height. Vacuum phenomenon.
Facet arthropathy. No subarticular zone narrowing. LEFT greater than
RIGHT foraminal narrowing could affect either L5 nerve root.

Correlating with previous MR in 8204, which was a good quality
study, there appears to be progressive stenosis at L1-2 and L2-3.
Significant pathology was present at that time at L3-4, which
remains, but has not worsened or improved. Unchanged appearance at
L4-5 and L5-S1.
IMPRESSION: LUMBAR MYELOGRAM IMPRESSION:

Lumbar myelogram demonstrates slight retrolisthesis at L1-2 and
L2-3, facet mediated.

With patient standing, in extension, ligamentum flavum infolding
contributes to functional dynamic instability, leading to moderate,
to borderline severe stenosis at the L2-3 and L3-4 levels; mild to
moderate stenosis is observed in lateral extension at L1-2.

CT LUMBAR MYELOGRAM IMPRESSION:

Progressive mild-to-moderate stenosis [place]1-2 and L2-3 compared to
previous lumbar MRI imaging in 8204. This is multifactorial, related
to retrolisthesis, posterior element hypertrophy, and
bulging/protruding disc material.

Mild to moderate stenosis at L3-4, central and to the RIGHT, not
significantly changed from priors, but with significant/persistent
RIGHT greater than LEFT L4 and L3 neural impingement.

No significant recurrent disc pathology at L4-5.

Aortic Atherosclerosis (27RA3-1K2.2). Ectatic abdominal aorta
cm, at risk for aneurysm development. Recommend followup by
ultrasound in 5 years. This recommendation follows ACR consensus
guidelines: White Paper of the ACR Incidental Findings Committee II

## 2019-09-29 ENCOUNTER — Encounter: Admit: 2019-09-29 | Payer: PRIVATE HEALTH INSURANCE

## 2019-10-09 ENCOUNTER — Encounter: Admit: 2019-10-09 | Payer: PRIVATE HEALTH INSURANCE

## 2019-10-22 ENCOUNTER — Encounter: Admit: 2019-10-22 | Payer: PRIVATE HEALTH INSURANCE

## 2019-11-04 ENCOUNTER — Encounter: Admit: 2019-11-04 | Payer: PRIVATE HEALTH INSURANCE

## 2019-11-18 ENCOUNTER — Encounter: Admit: 2019-11-18 | Payer: PRIVATE HEALTH INSURANCE

## 2019-11-25 ENCOUNTER — Encounter: Admit: 2019-11-25 | Payer: PRIVATE HEALTH INSURANCE

## 2019-12-24 ENCOUNTER — Encounter: Admit: 2019-12-24 | Payer: PRIVATE HEALTH INSURANCE

## 2020-01-05 ENCOUNTER — Encounter: Admit: 2020-01-05 | Payer: PRIVATE HEALTH INSURANCE

## 2020-01-06 ENCOUNTER — Encounter: Admit: 2020-01-06 | Payer: PRIVATE HEALTH INSURANCE

## 2020-01-07 ENCOUNTER — Encounter: Admit: 2020-01-07 | Payer: PRIVATE HEALTH INSURANCE

## 2020-01-07 DIAGNOSIS — F419 Anxiety disorder, unspecified: Secondary | ICD-10-CM

## 2020-01-07 DIAGNOSIS — E291 Testicular hypofunction: Secondary | ICD-10-CM

## 2020-01-07 DIAGNOSIS — M4800 Spinal stenosis, site unspecified: Secondary | ICD-10-CM

## 2020-01-07 DIAGNOSIS — E119 Type 2 diabetes mellitus without complications: Secondary | ICD-10-CM

## 2020-01-07 DIAGNOSIS — I10 Essential (primary) hypertension: Secondary | ICD-10-CM

## 2020-01-07 DIAGNOSIS — I2510 Atherosclerotic heart disease of native coronary artery without angina pectoris: Secondary | ICD-10-CM

## 2020-01-07 DIAGNOSIS — Z720 Tobacco use: Secondary | ICD-10-CM

## 2020-01-07 DIAGNOSIS — E559 Vitamin D deficiency, unspecified: Secondary | ICD-10-CM

## 2020-01-07 DIAGNOSIS — C3490 Malignant neoplasm of unspecified part of unspecified bronchus or lung: Secondary | ICD-10-CM

## 2020-01-07 DIAGNOSIS — K219 Gastro-esophageal reflux disease without esophagitis: Secondary | ICD-10-CM

## 2020-01-07 DIAGNOSIS — E785 Hyperlipidemia, unspecified: Secondary | ICD-10-CM

## 2020-01-07 DIAGNOSIS — N400 Enlarged prostate without lower urinary tract symptoms: Secondary | ICD-10-CM

## 2020-01-07 MED ORDER — ALBUTEROL SULFATE HFA 90 MCG/ACTUATION AEROSOL INHALER
90 mcg/actuation | Status: AC
Start: 2020-01-07 — End: 2020-03-25

## 2020-01-07 MED ORDER — GABAPENTIN 300 MG CAPSULE
300 mg | Status: AC
Start: 2020-01-07 — End: 2020-04-14

## 2020-01-07 MED ORDER — ATORVASTATIN 80 MG TABLET
80 mg | Freq: Every evening | ORAL | Status: AC
Start: 2020-01-07 — End: 2020-04-08

## 2020-01-07 MED ORDER — ESCITALOPRAM 10 MG TABLET
10 mg | Status: AC
Start: 2020-01-07 — End: 2020-03-25

## 2020-01-07 MED ORDER — ANORO ELLIPTA 62.5 MCG-25 MCG/ACTUATION POWDER FOR INHALATION
Status: AC
Start: 2020-01-07 — End: 2020-03-25

## 2020-01-08 ENCOUNTER — Telehealth: Admit: 2020-01-08 | Payer: PRIVATE HEALTH INSURANCE

## 2020-01-08 NOTE — Telephone Encounter
Diagnosis: Squamous cell lung cancer, transfer New consult with: TBDReferring provider:  Self, Dr. [place] Henderson [facility], [place] [phone]) I called Paul Hunter's sister in law, Paul Hunter who is coordinating appointments in [place] on his behalf.  Introduced myself and explained my role.    Paul Hunter is a 69 yo M (+smoler) who currently lives in [place] with plans to move back to [place] to be near family. He will move into his family member's in law apartment in [place] while undergoing cancer treatment.  He's currently self sufficient and independent.Records obtained from [facility]. Outside films ( chest, PET), pathology slides from EBUS, and reports & images of MRI brain performed on 01/05/20 have been requested.According to progress notes, Paul Hunter developed shortness of breath which prompted a CXR on 09/29/19 which showed pulmonary nodules. Additional imaging including  chest on 10/22/19 and PET on 10/1019 showed numerous pulmonary nodules and adenopathy suspicious for metastatic disease.  EBUS with LN biopsy on 11/18/19 revealed squamous cell carcinoma, sample insufficient for PDL1 testing. Mediport placed. Patient received C1D1 carbo/paclitaxel/pembro on 12/25/19. He plans on receiving C2 in [place] on 5/21 then moving up to  around 5/24. He would like to establish care with a [facility] oncologist prior to C3.Paul Hunter initially requested an appointment at [facility] in [facility] and I offered to schedule an appointment with Dr Paul Hunter.  Paul Hunter would prefer a male oncologist and a friend recommended Dr. Lehman Hunter. I will send the referral and records to Dr. Luanne Hunter office for review. Paul Hunter will call Dr. Luanne Hunter office on Monday and request an appointment prior to C3. Encouraged her to call me back if she has any further questions. Verbalizes understanding.

## 2020-01-12 ENCOUNTER — Encounter: Admit: 2020-01-12 | Payer: PRIVATE HEALTH INSURANCE | Attending: Vascular and Interventional Radiology

## 2020-01-18 ENCOUNTER — Encounter: Admit: 2020-01-18 | Payer: PRIVATE HEALTH INSURANCE | Attending: Hematology

## 2020-01-18 ENCOUNTER — Inpatient Hospital Stay: Admit: 2020-01-18 | Discharge: 2020-01-18 | Payer: PRIVATE HEALTH INSURANCE

## 2020-01-18 ENCOUNTER — Ambulatory Visit: Admit: 2020-01-18 | Payer: PRIVATE HEALTH INSURANCE | Attending: Hematology

## 2020-01-18 ENCOUNTER — Telehealth: Admit: 2020-01-18 | Payer: PRIVATE HEALTH INSURANCE | Attending: Cardiovascular Disease

## 2020-01-18 DIAGNOSIS — M4800 Spinal stenosis, site unspecified: Secondary | ICD-10-CM

## 2020-01-18 DIAGNOSIS — K219 Gastro-esophageal reflux disease without esophagitis: Secondary | ICD-10-CM

## 2020-01-18 DIAGNOSIS — E119 Type 2 diabetes mellitus without complications: Secondary | ICD-10-CM

## 2020-01-18 DIAGNOSIS — F17200 Nicotine dependence, unspecified, uncomplicated: Secondary | ICD-10-CM

## 2020-01-18 DIAGNOSIS — E559 Vitamin D deficiency, unspecified: Secondary | ICD-10-CM

## 2020-01-18 DIAGNOSIS — F419 Anxiety disorder, unspecified: Secondary | ICD-10-CM

## 2020-01-18 DIAGNOSIS — I2510 Atherosclerotic heart disease of native coronary artery without angina pectoris: Secondary | ICD-10-CM

## 2020-01-18 DIAGNOSIS — N400 Enlarged prostate without lower urinary tract symptoms: Secondary | ICD-10-CM

## 2020-01-18 DIAGNOSIS — Z720 Tobacco use: Secondary | ICD-10-CM

## 2020-01-18 DIAGNOSIS — E785 Hyperlipidemia, unspecified: Secondary | ICD-10-CM

## 2020-01-18 DIAGNOSIS — C3490 Malignant neoplasm of unspecified part of unspecified bronchus or lung: Secondary | ICD-10-CM

## 2020-01-18 DIAGNOSIS — E291 Testicular hypofunction: Secondary | ICD-10-CM

## 2020-01-18 DIAGNOSIS — I10 Essential (primary) hypertension: Secondary | ICD-10-CM

## 2020-01-18 NOTE — Telephone Encounter
Jess called from Dr Luanne Bras office referral for NP, patient to start chemo on 6/10, requesting sooner appt then July. Please advise.

## 2020-01-20 MED ORDER — PROCHLORPERAZINE MALEATE 10 MG TABLET
10 mg | ORAL_TABLET | Freq: Four times a day (QID) | ORAL | 2 refills | Status: SS | PRN
Start: 2020-01-20 — End: 2020-05-10

## 2020-01-21 ENCOUNTER — Ambulatory Visit: Admit: 2020-01-21 | Payer: PRIVATE HEALTH INSURANCE | Attending: Hematology

## 2020-01-29 ENCOUNTER — Encounter: Admit: 2020-01-29 | Payer: PRIVATE HEALTH INSURANCE | Attending: Hematology

## 2020-01-29 DIAGNOSIS — E119 Type 2 diabetes mellitus without complications: Secondary | ICD-10-CM

## 2020-01-29 DIAGNOSIS — N400 Enlarged prostate without lower urinary tract symptoms: Secondary | ICD-10-CM

## 2020-01-29 DIAGNOSIS — Z720 Tobacco use: Secondary | ICD-10-CM

## 2020-01-29 DIAGNOSIS — E785 Hyperlipidemia, unspecified: Secondary | ICD-10-CM

## 2020-01-29 DIAGNOSIS — K219 Gastro-esophageal reflux disease without esophagitis: Secondary | ICD-10-CM

## 2020-01-29 DIAGNOSIS — I2510 Atherosclerotic heart disease of native coronary artery without angina pectoris: Secondary | ICD-10-CM

## 2020-01-29 DIAGNOSIS — F419 Anxiety disorder, unspecified: Secondary | ICD-10-CM

## 2020-01-29 DIAGNOSIS — E559 Vitamin D deficiency, unspecified: Secondary | ICD-10-CM

## 2020-01-29 DIAGNOSIS — I10 Essential (primary) hypertension: Secondary | ICD-10-CM

## 2020-01-29 DIAGNOSIS — E291 Testicular hypofunction: Secondary | ICD-10-CM

## 2020-01-29 DIAGNOSIS — M4800 Spinal stenosis, site unspecified: Secondary | ICD-10-CM

## 2020-01-29 NOTE — Progress Notes
Oncology Initial Consult Note    Current Presentation:     HPI:  Mr. Schrecengost is a 69yo gentleman with 75py smoking history, history of CAD s/p MIx2 and PCI, who presents for transfer of care from [place] to [place] for his stage IV NSCLC.      He noticed worsening DOE around November and December 2020.  Chest Xray done in 09/2019 showed bilateral pulmonary nodules and chest [place] done shortly after showed numerous bilateral nodules with dominant left lung nodule measuring 1.4cm, as well as bilateral axillary, hilar and mediastinal nodes.  PET [place] 11/04/19 showed hyperavid lung nodules as well as diffuse adenopathy in neck, chest and upper abdomen.      He underwent ultrasound guided biopsy of infraclavicular lymph node on 11/18/19, which showed focal atypical cells concerning for carcinoma.   He subsequently underwent EBUS with biopsy of station 7 node, which was positive for metastatic squamous cell carcinoma.  There was insufficient material for molecular testing or PD-L1 staining.      Port was placed and he was started on treatment with Carbo/Taxol/Pembrolizumab on 12/25/19 and has now completed 2 cycles.  He reports that he has tolerated treatment well thus far,  He has noticed some neuropathy in his fingers following the last cycle, which has made it slightly difficult to button his shirt, though it is not painful.  He has fatigue and DOE which has perhaps worsened slightly since starting treatment.  He has trouble walking up a flight of stairs, but can walk for moderate distances on flat ground.   He is currently undergoing cardiac workup to see if underlying heart disease may also be playing a part in his symptoms.  He has chronic back pain, which is unchanged.  He continues to smoke 1.5ppd.      Review of Systems:  Negative except as noted above in HPI.     History:     Past Medical History:  Past Medical History:   Diagnosis Date   ? Anxiety    ? CAD (coronary artery disease)    ? Diabetes mellitus (HC Code)    ? GERD (gastroesophageal reflux disease)    ? Hyperlipidemia    ? Hypertension    ? Hypertrophy of prostate without urinary obstruction and other lower urinary tract symptoms (LUTS)    ? Other testicular hypofunction    ? Spinal stenosis 09/28/2018   ? Tobacco abuse    ? Vitamin D deficiency    -OSA on CPAP    Patient reports no history of diabetes  Patient reports no history of prostate problems or testicular hypofunction     Past Surgical History:  Past Surgical History:   Procedure Laterality Date   ? CAROTID STENT  08/28/2003   ? CHOLECYSTECTOMY  11/07/2016   ? KNEE SURGERY      torn meniscus   ? LAMINECTOMY  10/07/2008   ? LUMBAR DISC SURGERY      herniated disc     Family History:  No family history of cancer     Social History:  Mr. Macchia is retired, but was in the air[employer] previously and also worked as an Hotel manager.  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  He is divorced and has 2 children who live in the area.  He also has siblings in the area as well as one in [place].  He has smoked 1.5ppd for >50 years  and continues to smoke.  Minimal EtOH, no illicits.       Medications:    Current Outpatient Medications:   ?  albuterol sulfate,   ?  Anoro Ellipta,   ?  aspirin, Take 81 mg by mouth daily.  ?  atorvastatin, TAKE 1 TABLET BY MOUTH ONCE DAILY FOR 90 DAYS  ?  escitalopram oxalate, TAKE 1 TABLET BY MOUTH ONCE DAILY FOR 90 DAYS  ?  gabapentin, TAKE 1 CAPSULE BY MOUTH ONCE DAILY AT BEDTIME  ?  prochlorperazine, Take 1 tablet (10 mg total) by mouth every 6 (six) hours as needed for nausea.    Physical Exam:     Vitals:  Vitals:    01/18/20 1309   BP: (!) 143/85   Pulse: 84   Resp: 20   Temp: 97.4 ?F (36.3 ?C)         General:  Well  Appearing gentleman, sitting in chair, in NAD  HEENT: mmm, no oropharyngeal lesions  Chest: CTAB, good air movement, no wheezing    Cor: RRR, no MRG  Abd: Soft, nontender, nondistended, +BS  Ext: Warm, no edema  GU: Deferred  Skin: No rashes  Lymph Nodes: No palpable lymphadenopathy in the cervical, supraclavicular chains  Neuro: AOx3, otherwise grossly nonfocal  Data:     Labs:  09/30/19:  Electrolytes wnl   BUN/Cre 21/1.4  LFTs wnl  CBC: 11.3>13.2<283       Pathology:  11/18/19 Infraclavicular LN biopsy:  Focal area of atypical cells seen in some slides, suspicious for carcinoma     11/25/19 EBUS station 7 LN:  Neoplastic cells which stain positive for p63, negative for TTF-1.  Consistent with metastatic squamous cell carcinoma.  Insufficient material for PD-L1 staining or molecular testing      Imaging:  [place] Chest 10/22/19:  Multifocal adenopathy and pulmonary nodules. Findings almost certainly represent metastatic disease    PET [place] 11/04/19  Head And Neck:   Prominent suprathalamic FDG activity is nonspecific, possibly first-slice artifact.  ?  Bilateral palatine and lingual tonsillar FDG activity is indeterminate (SUV max 8.4).  ?  Multiple bilateral cervical lymph nodes are variably hypermetabolic, for example:  *  Right level 5B (SUV max 6).  *  Left supraclavicular and scalene (SUV max 6.9, up to 2.2 x 1.3 cm).  *  Bilateral level 2 (SUV max 3.2).  ?  CHEST:  Multiple hypermetabolic lymph nodes are highly suspicious for metastases, for example:  *  Right lower paratracheal (up to 3.9 x 2.6 cm, SUV max 13)  *  Aortopulmonary window/left paratracheal (SUV max 8.9)  *  Prevascular (SUV max 5.5)  *  Left axillary (SUV max 7.1, 2 2.5 x 1.7 cm)  *  Left subpectoral (1.4 x 1.2 cm, SUV max 11).  *  Right retrocrural (SUV max 8.7).  ?  Multiple bilateral pulmonary nodules are suspicious for metastases, some of which are of sufficient size to be hypermetabolic, for example:  *  Right upper lobe (1.3 cm, SUV max 8 point, image 97)  *  Right lower lobe (1.3 cm, SUV max 6.8)  *  Left lower lobe (1.4 cm, SUV max 3.6, image 130)  *  Left upper lobe (1.4 x 1.4 cm, SUV max 6.6)  ?  Abdomen/pelvis:  Spleen is diffusely mildly hypermetabolic in relation to liver (SUV max 4.4, versus 4 in the liver). Diffuse gastric body and fundus FDG activity is nonspecific, commonly inflammatory (SUV max 5.5).  ?  Several hypermetabolic retroperitoneal lymph nodes are consistent with malignancy, for example:  *  Left para-aortic (cluster of nodes 2.4 x 1 cm, SUV max 12)  *  Aortocaval (SUV max 4.2).  *  Portacaval (SUV max 3.3).  ?  Anorectal FDG activity and fullness is nonspecific (SUV max 8.1).  ?  Small nodule in the lateral limb of the right adrenal is not overtly hypermetabolic, although below resolution of PET (image 158, approximately 0.8 cm). Mild left adrenal FDG activity is within physiologic norm (SUV max 2.9).  ?  Gallbladder has been resected. Right kidney contains a cyst. Normal appearance of the liver, pancreas, small bowel, colon.  ?  Musculoskeletal:   Right acromial hypodensities demonstrate sclerotic rim, and are likely degenerative, without hypermetabolism. No suspicious osseous lesion is identified.  ?  IMPRESSION:  1.  Numerous bilateral hypermetabolic pulmonary nodules are consistent with metastases.  2.  Numerous cervical, mediastinal, left axillary/subpectoral, and retroperitoneal hypermetabolic lymph nodes are consistent with malignancy, either metastases, or superimposed hematologic malignancy.  3.  Spleen is diffusely mildly hypermetabolic in relation to the liver; this may represent activation or involvement by hematologic process.  4.  Anorectal FDG activity and fullness is nonspecific. Consider direct visualization.  5.  Bilateral palatine and lingual tonsillar FDG activity is indeterminate. Consider direct visualization.    MRI Brain 01/05/20:  No evidence of metastatic disease     Assessment and Plan:     Mr. Urton is a 69yo gentleman with 75py smoking history, history of CAD s/p MIx2 and PCI, who presents for transfer of care from [place] to [place] for his stage IV NSCLC.     Mr. Dirico was diagnosed with metastatic SCC of the lung in 10/2019.  Unfortunately his first biopsy was nondiagnostic and his second only with a very small amount of material.  As such, his PD-L1 status is unknown.  He was started on treatment with Carbo/Taxol/Pembrolizumab on 12/25/19 and has undergone 2 cycles.  He has tolerated relatively well overall, but has developed some neuropathy following second cycle.      #NSCLC:  Due for 3rd cycle of Carbo/Taxol/Pem on 02/05/20. We will monitor his neuropathy and if not resolved prior to next cycle would consider dose reducing taxol versus switching to abraxane.  Plan for re-imaging follow next cycle.      #CAD with hx of MI:  Is undergoing cardiac workup in [place] to see if any contribution of heart disease to his DOE.  Stress test from 2/21 with large irreversible perfusion defect consistent with past MI and EF 39%.  He does not appear grossly volume overloaded on exam, but would likely benefit from optimization of his cardiac management (he is not on an ACEi or a BB currently).  We will refer him to cardiology at [facility].    #Spinal stenosis:  Chronic back pain which has been managed with steroid injections.  Will refer to ortho spine for ongoing management.  Continue neurontin.      #Smoking cessation:  Has been unsuccesful in the past, but wants to quit.  Will refer to smoking cessation program.     Patient was seen and discussed with Dr. Bary Castilla. Steffanie Dunn, MD  Hematology/Oncology Fellow [facility]         Attending Statement   I have seen/evaluated the patient and agree with the H&P as documented by the Fellow Dr. Steffanie Dunn.  Mr. Hoefling was recently diagnosed  with metastatic squamous cell carcinoma of the lung, unknown PD-L1 status, now s/p 2 cycles of carboplatin/taxol/pembrolizumab.  His main side effect has been neuropathy of his fingers over the last few weeks.  We will plan to proceed with cycle 3 in 2 weeks although may need to make adjustments to the taxane if neuropathy persists.  Plan for repeat imaging before the 4 cycles.  He is new to the area so we will refer him to cardiology and ortho to establish care for chronic medical issues.    Nikki Dom

## 2020-02-04 ENCOUNTER — Inpatient Hospital Stay: Admit: 2020-02-04 | Discharge: 2020-02-04 | Payer: PRIVATE HEALTH INSURANCE

## 2020-02-04 ENCOUNTER — Encounter: Admit: 2020-02-04 | Payer: PRIVATE HEALTH INSURANCE | Attending: Hematology

## 2020-02-04 ENCOUNTER — Ambulatory Visit: Admit: 2020-02-04 | Payer: PRIVATE HEALTH INSURANCE | Attending: Hematology

## 2020-02-04 DIAGNOSIS — I2510 Atherosclerotic heart disease of native coronary artery without angina pectoris: Secondary | ICD-10-CM

## 2020-02-04 DIAGNOSIS — C771 Secondary and unspecified malignant neoplasm of intrathoracic lymph nodes: Secondary | ICD-10-CM

## 2020-02-04 DIAGNOSIS — F17210 Nicotine dependence, cigarettes, uncomplicated: Secondary | ICD-10-CM

## 2020-02-04 DIAGNOSIS — J439 Emphysema, unspecified: Secondary | ICD-10-CM

## 2020-02-04 DIAGNOSIS — R0600 Dyspnea, unspecified: Secondary | ICD-10-CM

## 2020-02-04 DIAGNOSIS — T451X5A Adverse effect of antineoplastic and immunosuppressive drugs, initial encounter: Secondary | ICD-10-CM

## 2020-02-04 DIAGNOSIS — M4800 Spinal stenosis, site unspecified: Secondary | ICD-10-CM

## 2020-02-04 DIAGNOSIS — I509 Heart failure, unspecified: Secondary | ICD-10-CM

## 2020-02-04 DIAGNOSIS — F419 Anxiety disorder, unspecified: Secondary | ICD-10-CM

## 2020-02-04 DIAGNOSIS — C3490 Malignant neoplasm of unspecified part of unspecified bronchus or lung: Secondary | ICD-10-CM

## 2020-02-04 DIAGNOSIS — I110 Hypertensive heart disease with heart failure: Secondary | ICD-10-CM

## 2020-02-04 DIAGNOSIS — I10 Essential (primary) hypertension: Secondary | ICD-10-CM

## 2020-02-04 DIAGNOSIS — C7802 Secondary malignant neoplasm of left lung: Secondary | ICD-10-CM

## 2020-02-04 DIAGNOSIS — Z720 Tobacco use: Secondary | ICD-10-CM

## 2020-02-04 DIAGNOSIS — Z79899 Other long term (current) drug therapy: Secondary | ICD-10-CM

## 2020-02-04 DIAGNOSIS — C7801 Secondary malignant neoplasm of right lung: Secondary | ICD-10-CM

## 2020-02-04 DIAGNOSIS — N400 Enlarged prostate without lower urinary tract symptoms: Secondary | ICD-10-CM

## 2020-02-04 DIAGNOSIS — Z955 Presence of coronary angioplasty implant and graft: Secondary | ICD-10-CM

## 2020-02-04 DIAGNOSIS — K219 Gastro-esophageal reflux disease without esophagitis: Secondary | ICD-10-CM

## 2020-02-04 DIAGNOSIS — C770 Secondary and unspecified malignant neoplasm of lymph nodes of head, face and neck: Secondary | ICD-10-CM

## 2020-02-04 DIAGNOSIS — Z7982 Long term (current) use of aspirin: Secondary | ICD-10-CM

## 2020-02-04 DIAGNOSIS — E119 Type 2 diabetes mellitus without complications: Secondary | ICD-10-CM

## 2020-02-04 DIAGNOSIS — E785 Hyperlipidemia, unspecified: Secondary | ICD-10-CM

## 2020-02-04 DIAGNOSIS — I252 Old myocardial infarction: Secondary | ICD-10-CM

## 2020-02-04 DIAGNOSIS — G620 Drug-induced polyneuropathy: Secondary | ICD-10-CM

## 2020-02-04 DIAGNOSIS — E291 Testicular hypofunction: Secondary | ICD-10-CM

## 2020-02-04 DIAGNOSIS — E559 Vitamin D deficiency, unspecified: Secondary | ICD-10-CM

## 2020-02-04 LAB — COMPREHENSIVE METABOLIC PANEL
BKR A/G RATIO: 1.3 (ref 1.0–2.2)
BKR ALANINE AMINOTRANSFERASE (ALT): 21 U/L (ref 9–59)
BKR ALBUMIN: 3.9 g/dL (ref 3.6–4.9)
BKR ALKALINE PHOSPHATASE: 90 U/L (ref 9–122)
BKR ANION GAP: 9 (ref 7–17)
BKR ASPARTATE AMINOTRANSFERASE (AST): 15 U/L (ref 10–35)
BKR AST/ALT RATIO: 0.7
BKR BILIRUBIN TOTAL: 0.2 mg/dL (ref <=1.2)
BKR BLOOD UREA NITROGEN: 14 mg/dL (ref 8–23)
BKR BUN / CREAT RATIO: 10.4 (ref 8.0–23.0)
BKR CALCIUM: 8.7 mg/dL — ABNORMAL LOW (ref 8.8–10.2)
BKR CHLORIDE: 105 mmol/L (ref 98–107)
BKR CO2: 24 mmol/L (ref 20–30)
BKR CREATININE: 1.35 mg/dL — ABNORMAL HIGH (ref 0.40–1.30)
BKR EGFR (AFR AMER): >60 mL/min/{1.73_m2} (ref >60)
BKR EGFR (NON AFRICAN AMERICAN): 53 mL/min/{1.73_m2} (ref >60)
BKR GLOBULIN: 3.1 g/dL (ref 2.3–3.5)
BKR GLUCOSE: 97 mg/dL (ref 70–100)
BKR POTASSIUM: 4.1 mmol/L (ref 3.3–5.1)
BKR PROTEIN TOTAL: 7 g/dL (ref 6.6–8.7)
BKR SODIUM: 138 mmol/L (ref 136–144)

## 2020-02-04 LAB — CBC WITH AUTO DIFFERENTIAL
BKR WAM ABSOLUTE IMMATURE GRANULOCYTES: 0 x 1000/ÂµL (ref 0.0–0.3)
BKR WAM ABSOLUTE LYMPHOCYTE COUNT: 2 x 1000/ÂµL (ref 1.0–4.0)
BKR WAM ABSOLUTE NRBC: 0 x 1000/ÂµL (ref 0.0–0.0)
BKR WAM ANALYZER ANC: 3.5 x 1000/ÂµL (ref 1.0–11.0)
BKR WAM BASOPHIL ABSOLUTE COUNT: 0 x 1000/ÂµL (ref 0.0–0.0)
BKR WAM BASOPHILS: 0.3 % (ref 0.0–4.0)
BKR WAM EOSINOPHIL ABSOLUTE COUNT: 0 x 1000/ÂµL (ref 0.0–1.0)
BKR WAM EOSINOPHILS: 0.5 % (ref 0.0–7.0)
BKR WAM HEMATOCRIT: 35.9 % — ABNORMAL LOW (ref 37.0–52.0)
BKR WAM HEMOGLOBIN: 11.7 g/dL — ABNORMAL LOW (ref 12.0–18.0)
BKR WAM IMMATURE GRANULOCYTES: 0.5 % (ref 0.0–3.0)
BKR WAM LYMPHOCYTES: 32.6 % (ref 8.0–49.0)
BKR WAM MCH (PG): 29.3 pg (ref 27.0–31.0)
BKR WAM MCHC: 32.6 g/dL (ref 31.0–36.0)
BKR WAM MCV: 89.8 fL (ref 78.0–94.0)
BKR WAM MONOCYTE ABSOLUTE COUNT: 0.5 x 1000/ÂµL (ref 0.0–2.0)
BKR WAM MONOCYTES: 7.8 % (ref 4.0–15.0)
BKR WAM MPV: 9.3 fL (ref 6.0–11.0)
BKR WAM NEUTROPHILS: 58.3 % (ref 37.0–84.0)
BKR WAM NUCLEATED RED BLOOD CELLS: 0 % (ref 0.0–1.0)
BKR WAM PLATELETS: 174 x1000/ÂµL (ref 140–440)
BKR WAM RDW-CV: 15.6 % — ABNORMAL HIGH (ref 11.5–14.5)
BKR WAM RED BLOOD CELL COUNT: 4 M/ÂµL (ref 3.8–5.9)
BKR WAM WHITE BLOOD CELL COUNT: 6.1 x1000/ÂµL (ref 4.0–10.0)

## 2020-02-04 LAB — CK     (BH GH L LMW YH): BKR CREATINE KINASE TOTAL: 72 U/L (ref 11–204)

## 2020-02-04 LAB — TSH W/REFLEX TO FT4     (BH GH LMW Q YH): BKR THYROID STIMULATING HORMONE: 2.03 u[IU]/mL

## 2020-02-04 MED ORDER — IOHEXOL 350 MG IODINE/ML INTRAVENOUS SOLUTION
350 mg iodine/mL | Freq: Once | INTRAVENOUS | Status: CP | PRN
Start: 2020-02-04 — End: ?
  Administered 2020-02-04: 16:00:00 350 mL via INTRAVENOUS

## 2020-02-04 MED ORDER — HEPARIN, PORCINE (PF) 100 UNIT/ML IN 0.9% SODIUM CHLORIDE IV SYRINGE
100 unit/mL | Status: DC | PRN
Start: 2020-02-04 — End: 2020-02-09
  Administered 2020-02-04: 16:00:00 100 mL

## 2020-02-04 MED ORDER — SODIUM CHLORIDE 0.9 % (FLUSH) INJECTION SYRINGE
0.9 % | Status: DC | PRN
Start: 2020-02-04 — End: 2020-02-09
  Administered 2020-02-04: 16:00:00 0.9 mL

## 2020-02-04 MED ORDER — SODIUM CHLORIDE 0.9 % BOLUS (NEW BAG)
0.9 % | Freq: Once | INTRAVENOUS | Status: DC
Start: 2020-02-04 — End: 2020-02-09

## 2020-02-04 NOTE — Progress Notes
Thoracic Oncology Note    Current Presentation:     HPI:  [name] is a 69yo gentleman with 75py smoking history, history of CAD s/p MIx2 and PCI, who presents for transfer of care from [place] to [place] for his stage IV NSCLC.      He noticed worsening DOE around November and December 2020.  Chest Xray done in 09/2019 showed bilateral pulmonary nodules and chest [place] done shortly after showed numerous bilateral nodules with dominant left lung nodule measuring 1.4cm, as well as bilateral axillary, hilar and mediastinal nodes.  PET [place] 11/04/19 showed hyperavid lung nodules as well as diffuse adenopathy in neck, chest and upper abdomen.      He underwent ultrasound guided biopsy of infraclavicular lymph node on 11/18/19, which showed focal atypical cells concerning for carcinoma.   He subsequently underwent EBUS with biopsy of station 7 node, which was positive for metastatic squamous cell carcinoma.  There was insufficient material for molecular testing or PD-L1 staining.      Port was placed and he was started on treatment with Carbo/Taxol/Pembrolizumab on 12/25/19 and has now completed 2 cycles.     INTERIM HISTORY: [name] with his sister-in-law for followup and consideration of cycle 3 treatment.  He has had more fatigue recently, needs a 2 hour nap every day which is unusual for him.  He also notes worsening dyspnea on exertion, particularly with stairs, no improvement with albuterol.  Has a smokers cough, intermittent and unchanged from baseline.  No leg pain or edema.  Continues to have numbness in his fingers.  Still smoking but less than usual.  Feels that his legs have gotten weaker.  No back pain.  Was getting spine injections in [place], last time was in April.    Review of Systems:  Negative except as noted above in HPI.     Social History:  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  He is divorced and has 2 children who live in the area.  He also has siblings in the area as well as one in [place].  He has smoked 1.5ppd for >50 years and continues to smoke.  Minimal EtOH, no illicits.       Medications:    Current Outpatient Medications:   ?  albuterol sulfate,   ?  Anoro Ellipta,   ?  aspirin, Take 81 mg by mouth daily.  ?  atorvastatin, TAKE 1 TABLET BY MOUTH ONCE DAILY FOR 90 DAYS  ?  escitalopram oxalate, TAKE 1 TABLET BY MOUTH ONCE DAILY FOR 90 DAYS  ?  gabapentin, TAKE 1 CAPSULE BY MOUTH ONCE DAILY AT BEDTIME  ?  prochlorperazine, Take 1 tablet (10 mg total) by mouth every 6 (six) hours as needed for nausea.    Physical Exam:     Vitals:  Vitals:    02/04/20 0850   BP: (!) 141/73   Pulse:    Resp:    Temp: 97.5 ?F (36.4 ?C)       97% at rest, 94% on RA with ambulation  General:  Well appearing man in NAD  HEENT: No cervical or supraclavicular adenopathy  Chest: CTAB  Cor: RRR, no MRG  Abd: Soft, nontender, nondistended, +BS  Ext: Warm, no edema or calf tenderness  Skin: No rashes  Neuro: AOx3,  grossly nonfocal    Data:     Complete Blood Count:  Lab Results   Component Value Date    WBC 6.1 02/04/2020    RBC 4.0 02/04/2020    HGB 11.7 (L) 02/04/2020    HCT 35.9 (L) 02/04/2020    MCV 89.8 02/04/2020    MCH 29.3 02/04/2020    MCHC 32.6 02/04/2020    PLT 174 02/04/2020    MPV 9.3 02/04/2020     Comprehensive Metabolic Panel:  Lab Results   Component Value Date    GLU 97 02/04/2020    BUN 14 02/04/2020    CREATININE 1.35 (H) 02/04/2020    NA 138 02/04/2020    K 4.1 02/04/2020    CL 105 02/04/2020    CO2 24 02/04/2020    ALBUMIN 3.9 02/04/2020    PROT 7.0 02/04/2020    BILITOT 0.2 02/04/2020    ALKPHOS 90 02/04/2020    ALT 21 02/04/2020    ALT 14 08/14/2011    GLOB 3.1 02/04/2020    CALCIUM 8.7 (L) 02/04/2020    EGFR >60 08/14/2011         Assessment and Plan:     [name] is a 69 year old man with 75py smoking history, history of CAD s/p MIx2 and PCI, who presents for transfer of care from [place] to [place] for his stage IV NSCLC.     [name] was diagnosed with metastatic SCC of the lung in 10/2019.  His first biopsy was nondiagnostic and his second only with a very small amount of material.  As such, his PD-L1 status is unknown.  He was started on treatment with Carbo/Taxol/Pembrolizumab on 12/25/19 and has received 2 cycles.  He has tolerated treatment relatively well overall, but has developed some neuropathy following the second cycle.  He does have worsening dyspnea on exertion although has good O2 sats.  We will hold treatment today while we work this up further, particularly to rule out an inflammatory etiology.      -Hold treatment today for dyspnea  -Chest [place]/CTA  -Echo if no explanation for dyspnea on [place]  -Check CK and TSH for muscle weakness  -If workup is unremarkable will plan for treatment next week with dose reduction of taxol for neuropathy  -Refer to ortho for spinal stenosis  -Refer to cardio-oncology for history of CAD and CHF

## 2020-02-08 ENCOUNTER — Inpatient Hospital Stay: Admit: 2020-02-08 | Discharge: 2020-02-08 | Payer: PRIVATE HEALTH INSURANCE

## 2020-02-08 DIAGNOSIS — I77810 Thoracic aortic ectasia: Secondary | ICD-10-CM

## 2020-02-08 DIAGNOSIS — R0600 Dyspnea, unspecified: Secondary | ICD-10-CM

## 2020-02-08 MED ORDER — PERFLUTREN LIPID MICROSPHERES 1.3 ML/10 ML NS INJ (IN
Freq: Once | INTRAVENOUS | Status: CP | PRN
Start: 2020-02-08 — End: ?
  Administered 2020-02-08: 20:00:00 10.000 mL via INTRAVENOUS

## 2020-02-11 ENCOUNTER — Encounter: Admit: 2020-02-11 | Payer: PRIVATE HEALTH INSURANCE | Attending: Acute Care

## 2020-02-11 ENCOUNTER — Inpatient Hospital Stay: Admit: 2020-02-11 | Discharge: 2020-02-11 | Payer: PRIVATE HEALTH INSURANCE

## 2020-02-11 ENCOUNTER — Ambulatory Visit: Admit: 2020-02-11 | Payer: PRIVATE HEALTH INSURANCE | Attending: Acute Care

## 2020-02-11 DIAGNOSIS — C7802 Secondary malignant neoplasm of left lung: Secondary | ICD-10-CM

## 2020-02-11 DIAGNOSIS — F17200 Nicotine dependence, unspecified, uncomplicated: Secondary | ICD-10-CM

## 2020-02-11 DIAGNOSIS — E291 Testicular hypofunction: Secondary | ICD-10-CM

## 2020-02-11 DIAGNOSIS — I509 Heart failure, unspecified: Secondary | ICD-10-CM

## 2020-02-11 DIAGNOSIS — F419 Anxiety disorder, unspecified: Secondary | ICD-10-CM

## 2020-02-11 DIAGNOSIS — I2510 Atherosclerotic heart disease of native coronary artery without angina pectoris: Secondary | ICD-10-CM

## 2020-02-11 DIAGNOSIS — G629 Polyneuropathy, unspecified: Secondary | ICD-10-CM

## 2020-02-11 DIAGNOSIS — Z5111 Encounter for antineoplastic chemotherapy: Secondary | ICD-10-CM

## 2020-02-11 DIAGNOSIS — I252 Old myocardial infarction: Secondary | ICD-10-CM

## 2020-02-11 DIAGNOSIS — R7989 Other specified abnormal findings of blood chemistry: Secondary | ICD-10-CM

## 2020-02-11 DIAGNOSIS — C7801 Secondary malignant neoplasm of right lung: Secondary | ICD-10-CM

## 2020-02-11 DIAGNOSIS — E785 Hyperlipidemia, unspecified: Secondary | ICD-10-CM

## 2020-02-11 DIAGNOSIS — E119 Type 2 diabetes mellitus without complications: Secondary | ICD-10-CM

## 2020-02-11 DIAGNOSIS — C778 Secondary and unspecified malignant neoplasm of lymph nodes of multiple regions: Secondary | ICD-10-CM

## 2020-02-11 DIAGNOSIS — R0609 Other forms of dyspnea: Secondary | ICD-10-CM

## 2020-02-11 DIAGNOSIS — Z7982 Long term (current) use of aspirin: Secondary | ICD-10-CM

## 2020-02-11 DIAGNOSIS — Z5112 Encounter for antineoplastic immunotherapy: Secondary | ICD-10-CM

## 2020-02-11 DIAGNOSIS — M4800 Spinal stenosis, site unspecified: Secondary | ICD-10-CM

## 2020-02-11 DIAGNOSIS — C3412 Malignant neoplasm of upper lobe, left bronchus or lung: Secondary | ICD-10-CM

## 2020-02-11 DIAGNOSIS — C3490 Malignant neoplasm of unspecified part of unspecified bronchus or lung: Secondary | ICD-10-CM

## 2020-02-11 DIAGNOSIS — Z79899 Other long term (current) drug therapy: Secondary | ICD-10-CM

## 2020-02-11 DIAGNOSIS — N400 Enlarged prostate without lower urinary tract symptoms: Secondary | ICD-10-CM

## 2020-02-11 DIAGNOSIS — Z720 Tobacco use: Secondary | ICD-10-CM

## 2020-02-11 DIAGNOSIS — I10 Essential (primary) hypertension: Secondary | ICD-10-CM

## 2020-02-11 DIAGNOSIS — E559 Vitamin D deficiency, unspecified: Secondary | ICD-10-CM

## 2020-02-11 DIAGNOSIS — K219 Gastro-esophageal reflux disease without esophagitis: Secondary | ICD-10-CM

## 2020-02-11 LAB — CBC WITH AUTO DIFFERENTIAL
BKR WAM ABSOLUTE IMMATURE GRANULOCYTES: 0 x 1000/ÂµL (ref 0.0–0.3)
BKR WAM ABSOLUTE LYMPHOCYTE COUNT: 1.7 x 1000/ÂµL (ref 1.0–4.0)
BKR WAM ABSOLUTE NRBC: 0 x 1000/ÂµL (ref 0.0–0.0)
BKR WAM ANALYZER ANC: 3.7 x 1000/ÂµL (ref 1.0–11.0)
BKR WAM BASOPHIL ABSOLUTE COUNT: 0 x 1000/ÂµL (ref 0.0–0.0)
BKR WAM BASOPHILS: 0.7 % (ref 0.0–4.0)
BKR WAM EOSINOPHIL ABSOLUTE COUNT: 0.1 x 1000/ÂµL (ref 0.0–1.0)
BKR WAM EOSINOPHILS: 1 % (ref 0.0–7.0)
BKR WAM HEMATOCRIT: 36.6 % — ABNORMAL LOW (ref 37.0–52.0)
BKR WAM HEMOGLOBIN: 11.9 g/dL — ABNORMAL LOW (ref 12.0–18.0)
BKR WAM IMMATURE GRANULOCYTES: 0.2 % (ref 0.0–3.0)
BKR WAM LYMPHOCYTES: 28.8 % (ref 8.0–49.0)
BKR WAM MCH (PG): 29.5 pg (ref 27.0–31.0)
BKR WAM MCHC: 32.5 g/dL (ref 31.0–36.0)
BKR WAM MCV: 90.6 fL (ref 78.0–94.0)
BKR WAM MONOCYTE ABSOLUTE COUNT: 0.5 x 1000/ÂµL (ref 0.0–2.0)
BKR WAM MONOCYTES: 7.5 % (ref 4.0–15.0)
BKR WAM MPV: 9.1 fL (ref 6.0–11.0)
BKR WAM NEUTROPHILS: 61.8 % (ref 37.0–84.0)
BKR WAM NUCLEATED RED BLOOD CELLS: 0 % (ref 0.0–1.0)
BKR WAM PLATELETS: 220 x1000/ÂµL (ref 140–440)
BKR WAM RDW-CV: 16.2 % — ABNORMAL HIGH (ref 11.5–14.5)
BKR WAM RED BLOOD CELL COUNT: 4 M/ÂµL (ref 3.8–5.9)
BKR WAM WHITE BLOOD CELL COUNT: 6 x1000/ÂµL (ref 4.0–10.0)

## 2020-02-11 LAB — COMPREHENSIVE METABOLIC PANEL
BKR A/G RATIO: 1.1 (ref 1.0–2.2)
BKR ALANINE AMINOTRANSFERASE (ALT): 19 U/L (ref 9–59)
BKR ALBUMIN: 3.7 g/dL (ref 3.6–4.9)
BKR ALKALINE PHOSPHATASE: 92 U/L (ref 9–122)
BKR ANION GAP: 8 (ref 7–17)
BKR ASPARTATE AMINOTRANSFERASE (AST): 13 U/L (ref 10–35)
BKR AST/ALT RATIO: 0.7
BKR BILIRUBIN TOTAL: 0.3 mg/dL (ref <=1.2)
BKR BLOOD UREA NITROGEN: 14 mg/dL (ref 8–23)
BKR BUN / CREAT RATIO: 9.9 (ref 8.0–23.0)
BKR CALCIUM: 9.2 mg/dL (ref 8.8–10.2)
BKR CHLORIDE: 106 mmol/L (ref 98–107)
BKR CO2: 26 mmol/L (ref 20–30)
BKR CREATININE: 1.42 mg/dL — ABNORMAL HIGH (ref 0.40–1.30)
BKR EGFR (AFR AMER): 60 mL/min/{1.73_m2} (ref >60)
BKR EGFR (NON AFRICAN AMERICAN): 50 mL/min/{1.73_m2} (ref >60)
BKR GLOBULIN: 3.3 g/dL (ref 2.3–3.5)
BKR GLUCOSE: 104 mg/dL — ABNORMAL HIGH (ref 70–100)
BKR POTASSIUM: 4.2 mmol/L (ref 3.3–5.1)
BKR PROTEIN TOTAL: 7 g/dL (ref 6.6–8.7)
BKR SODIUM: 140 mmol/L (ref 136–144)

## 2020-02-11 LAB — T4, FREE: BKR FREE T4: 1.04 ng/dL

## 2020-02-11 LAB — TSH W/REFLEX TO FT4     (BH GH LMW Q YH): BKR THYROID STIMULATING HORMONE: 4.48 u[IU]/mL — ABNORMAL HIGH

## 2020-02-11 MED ORDER — APREPITANT 7.2 MG/ML INTRAVENOUS EMULSION
7.2 mg/mL | Freq: Once | INTRAVENOUS | Status: CP
Start: 2020-02-11 — End: ?
  Administered 2020-02-11: 13:00:00 7.2 mL via INTRAVENOUS

## 2020-02-11 MED ORDER — DIPHENHYDRAMINE 25 MG CAPSULE
25 mg | Freq: Once | ORAL | Status: CP
Start: 2020-02-11 — End: ?
  Administered 2020-02-11: 13:00:00 25 mg via ORAL

## 2020-02-11 MED ORDER — HYDROCORTISONE SODIUM SUCCINATE 100 MG SOLUTION FOR INJECTION
100 mg | Freq: Once | INTRAVENOUS | Status: DC | PRN
Start: 2020-02-11 — End: 2020-02-16

## 2020-02-11 MED ORDER — PEMBROLIZUMAB INFUSION
Freq: Once | INTRAVENOUS | Status: CP
Start: 2020-02-11 — End: ?
  Administered 2020-02-11: 13:00:00 100.000 mL/h via INTRAVENOUS

## 2020-02-11 MED ORDER — CARBOPLATIN INFUSION (BY AUC) (DOSE>=125MG)
Freq: Once | INTRAVENOUS | Status: CP
Start: 2020-02-11 — End: ?
  Administered 2020-02-11: 17:00:00 250.000 mL/h via INTRAVENOUS

## 2020-02-11 MED ORDER — PACLITAXEL NON-RANGED DOSE INFUSION
Freq: Once | INTRAVENOUS | Status: CP
Start: 2020-02-11 — End: ?
  Administered 2020-02-11: 14:00:00 500.000 mL/h via INTRAVENOUS

## 2020-02-11 MED ORDER — FAMOTIDINE 4 MG/ML IN STERILE WATER (ADULT)
Freq: Once | INTRAVENOUS | Status: DC | PRN
Start: 2020-02-11 — End: 2020-02-16

## 2020-02-11 MED ORDER — MEPERIDINE 25 MG/2.5 ML IN 0.9% SODIUM CHLORIDE
Freq: Once | INTRAVENOUS | Status: DC | PRN
Start: 2020-02-11 — End: 2020-02-16

## 2020-02-11 MED ORDER — FAMOTIDINE 20 MG TABLET
20 mg | Freq: Once | ORAL | Status: CP
Start: 2020-02-11 — End: ?
  Administered 2020-02-11: 13:00:00 20 mg via ORAL

## 2020-02-11 MED ORDER — PALONOSETRON 0.25 MG/5 ML INTRAVENOUS SOLUTION
0.25 mg/5 mL | Freq: Once | INTRAVENOUS | Status: CP
Start: 2020-02-11 — End: ?
  Administered 2020-02-11: 13:00:00 0.25 mL via INTRAVENOUS

## 2020-02-11 MED ORDER — DIPHENHYDRAMINE 50 MG/ML INJECTION SOLUTION
50 mg/mL | Freq: Once | INTRAVENOUS | Status: DC | PRN
Start: 2020-02-11 — End: 2020-02-16

## 2020-02-11 MED ORDER — DEXAMETHASONE SODIUM PHOSPHATE (PF) 10 MG/ML INJECTION SOLUTION
10 mg/mL | Freq: Once | INTRAVENOUS | Status: CP
Start: 2020-02-11 — End: ?
  Administered 2020-02-11: 13:00:00 10 mL via INTRAVENOUS

## 2020-02-11 MED ORDER — SODIUM CHLORIDE 0.9 % BOLUS (NEW BAG)
0.9 % | Freq: Once | INTRAVENOUS | Status: DC | PRN
Start: 2020-02-11 — End: 2020-02-16

## 2020-02-11 MED ORDER — EPINEPHRINE 0.3 MG/0.3 ML INJECTION, AUTO-INJECTOR
0.3 mg/ mL | INTRAMUSCULAR | Status: DC | PRN
Start: 2020-02-11 — End: 2020-02-16

## 2020-02-11 NOTE — Progress Notes
Pt arrived for C1 D1 of Pembrolizumab + Paclitaxel + Carboplatin.  Pt was seen in the lab, port accessed and labs drawn.  Pt was seen and cleared by K Medow APRN.  Consent signed.  Pt denies any complaints at this time, baseline is SOBOE, no home oxygen; CPAP at night.  Reviewed medications, pre medications, schedule of day, number to call, temp 100.4 or greater, side effects of medications; pt states he has not had any yet.  Support provided.He has received 2 cycles in [place].Pre medicated with po Benadryl and Pepcid.  IVP of Dexamethasone, Aloxi, and Cinvanti.Pembrolizumab 200 mg given onver 30 minutes, flushed per policy.  Paclitaxel 336 mg given over 3 hours.Carboplatin 500 mg given over 30 minutes.  Pt tolerated all infusions without incident, good blood return noted pre and post all infusions.Pt declined AVS, uses My Chart.  Instructed to call clinic with any changes in status or questions, verbalized understanding.

## 2020-02-11 NOTE — Other
PHARMACIST: CHEMOTHERAPY NOTEPatient Diagnosis: Stage IV NSCLC, SquamousAttending: Dr. GoldbergProtocol/Regimen Name: Cycle:  3 (First two cycles administered at outside facility)Treatment plan medications: Pembrolizumab 200mg  IV over 30 minutes (d1)Paclitaxel 130mg /m2 IV over 3 hours (d1)Carboplatin AUC 5 IV over 30 minutes (d1)Cycle repeats every 21 daysSupportive Care:?	Emesis prophylaxis: (high risk) - aprepitant 130mg  IV, palonosetron 0.25mg  IV, dexamethasone as below?	Hypersensitivity reaction prophylaxis:o	Prior to first two doses of paclitaxel: dexamethasone 10mg  IV, diphenhydramine 25mg  PO, famotidine 20mg  POo	Prior to subsequent doses if no reaction: dexamethasone 8mg  PO?	Growth factors: Primary prophylaxis not indicatedCurrent Medications Medication Sig ? albuterol sulfate 90 mcg/actuation HFA aerosol inhaler  ? ANORO ELLIPTA 62.5-25 mcg/actuation blister powder for inhalation  ? aspirin 81 MG EC tablet Take 81 mg by mouth daily. ? atorvastatin (LIPITOR) 80 mg tablet TAKE 1 TABLET BY MOUTH ONCE DAILY FOR 90 DAYS ? escitalopram oxalate (LEXAPRO) 10 mg tablet TAKE 1 TABLET BY MOUTH ONCE DAILY FOR 90 DAYS ? gabapentin (NEURONTIN) 300 mg capsule TAKE 1 CAPSULE BY MOUTH ONCE DAILY AT BEDTIME ? prochlorperazine (COMPAZINE) 10 mg tablet Take 1 tablet (10 mg total) by mouth every 6 (six) hours as needed for nausea. Drug Interaction assessment: no drug-drug interactions identified with current list of outpatient medications and chemotherapy regimen Signed consent:  Obtained 02/11/20, to be scanned into media Prior Auth: approved through 11/27/21Patient Description: 69 yo MaleWeight: 131.8 kg                Height: 182.9 cmBSA: 2.59 m2BMI: 39.40 kg/m2Comments regarding dose calculations: Actual body weight used to calculate dose of paclitaxel; Adjusted body weight used to calculate dose of carboplatin per [employer].Monitoring ParametersCBC and CMP reviewedCBC:Recent Results (from the past 8736 hour(s)) CBC auto differential  Collection Time: 02/11/20  8:09 AM Result Value Ref Range  WBC 6.0 4.0 - 10.0 x1000/?L  RBC 4.0 3.8 - 5.9 M/?L  Hemoglobin 11.9 (L) 12.0 - 18.0 g/dL  Hematocrit 16.1 (L) 09.6 - 52.0 %  MCV 90.6 78.0 - 94.0 fL  MCHC 32.5 31.0 - 36.0 g/dL  RDW-CV 04.5 (H) 40.9 - 14.5 %  Platelets 220 140 - 440 x1000/?L  MPV 9.1 6.0 - 11.0 fL  ANC (Abs Neutrophil Count) 3.7 1.0 - 11.0 x 1000/?L  Neutrophils 61.8 37.0 - 84.0 %  Lymphocytes 28.8 8.0 - 49.0 %  Absolute Lymphocyte Count 1.7 1.0 - 4.0 x 1000/?L  Monocytes 7.5 4.0 - 15.0 %  Monocyte Absolute Count 0.5 0.0 - 2.0 x 1000/?L  Eosinophils 1.0 0.0 - 7.0 %  Eosinophil Absolute Count 0.1 0.0 - 1.0 x 1000/?L  Basophil 0.7 0.0 - 4.0 %  Basophil Absolute Count 0.0 0.0 - 0.0 x 1000/?L  Immature Granulocytes 0.2 0.0 - 3.0 %  Absolute Immature Granulocyte Count 0.0 0.0 - 0.3 x 1000/?L  nRBC 0.0 0.0 - 1.0 %  Absolute nRBC 0.0 0.0 - 0.0 x 1000/?L  MCH 29.5 27.0 - 31.0 pg [id number] Results (from the past 8736 hour(s)) Comprehensive metabolic panel  Collection Time: 02/11/20  8:09 AM Result Value Ref Range  Sodium 140 136 - 144 mmol/L  Potassium 4.2 3.3 - 5.1 mmol/L  Chloride 106 98 - 107 mmol/L  CO2 26 20 - 30 mmol/L  Anion Gap 8 7 - 17  Glucose 104 (H) 70 - 100 mg/dL  BUN 14 8 - 23 mg/dL  Creatinine 9.56 (H) 2.13 - 1.30 mg/dL  Calcium 9.2 8.8 - 08.6 mg/dL  BUN/Creatinine Ratio 9.9 8.0 - 23.0  Total Protein 7.0 6.6 - 8.7 g/dL  Albumin  3.7 3.6 - 4.9 g/dL  Total Bilirubin 0.3 <=5.7 mg/dL  Alkaline Phosphatase 92 9 - 122 U/L  Alanine Aminotransferase (ALT) 19 9 - 59 U/L  Aspartate Aminotransferase (AST) 13 10 - 35 U/L  Globulin 3.3 2.3 - 3.5 g/dL  A/G Ratio 1.1 1.0 - 2.2  AST/ALT Ratio 0.7 See Comment  eGFR (Afr Amer) 60 >60 mL/min/1.43m2  eGFR (NON African-American) 50 >60 mL/min/1.78m2 eClCr =  70.6 ml/min (Cockcroft-Gault equation, wt =  99 kg (adjusted BW), SCr =  1.42mg /dL) Oncology Treatment History: - 06/2019: Noticed worsening DOE- 09/2019: CXR showed B/L pulmonary nodules and chest [place] also with dominant L lung nodule and B/L axillary, hilar and mediastinal nodes- 11/04/19: PET  with diffuse adenopathy in neck, chest, and upper abdomen- 10/2019: EBUS positive for metastatic squamous cell carcinoma- 12/25/19: s/p 2 Cycles Carboplatin/Paclitaxel + Pembrolizumab at outside facility- 02/11/20: Cycle 3 Carboplatin/Paclitaxel + PembrolizumabChemotherapy note completed [id number] Signed by Julieanne Cotton, PharmD, BCPS, BCOPJune 17, 2021

## 2020-02-11 NOTE — Patient Instructions
Carboplatin injectionWhat is this medicine?CARBOPLATIN (KAR boe pla tin) is a chemotherapy drug. It targets fast dividing cells, like cancer cells, and causes these cells to die. This medicine is used to treat ovarian cancer and many other cancers.This medicine may be used for other purposes; ask your health care provider or pharmacist if you have questions.COMPaclitaxel injectionWhat is this medicine?PACLITAXEL (PAK li TAX el) is a chemotherapy drug. It targets fast dividing cells, like cancer cells, and causes these cells to die. This medicine is used to treat ovarian cancer, breast cancer, lung cancer, Kaposi's sarcoma, and other cancers.This medicine may be used for other purposes; ask your health care provider or pharmacist if you have questions.COMMON BRAND NAME(S): Onxol, TaxolWhat should I tell my health care provider before I take this medicine?They need to know if you have any of these conditions:?	history of irregular heartbeat?	liver disease?	low blood counts, like low white cell, platelet, or red cell counts?	lung or breathing disease, like asthma?	tingling of the fingers or toes, or other nerve disorder?	an unusual or allergic reaction to paclitaxel, alcohol, polyoxyethylated castor oil, other chemotherapy, other medicines, foods, dyes, or preservatives?	pregnant or trying to get pregnant?	breast-feedingHow should I use this medicine?This drug is given as an infusion into a vein. It is administered in a hospital or clinic by a specially trained health care professional.Talk to your pediatrician regarding the use of this medicine in children. Special care may be needed.Overdosage: If you think you have taken too much of this medicine contact a poison control center or emergency room at once.NOTE: This medicine is only for you. Do not share this medicine with others.What if I miss a dose?It is important not to miss your dose. Call your doctor or health care professional if you are unable to keep an appointment.What may interact with this medicine?Do not take this medicine with any of the following medications:?	disulfiram?	metronidazoleThis medicine may also interact with the following medications:?	antiviral medicines for hepatitis, HIV or AIDS?	certain antibiotics like erythromycin and clarithromycin?	certain medicines for fungal infections like ketoconazole and itraconazole?	certain medicines for seizures like carbamazepine, phenobarbital, phenytoin?	gemfibrozil?	nefazodone?	rifampin?	St. John's wortThis list may not describe all possible interactions. Give your health care provider a list of all the medicines, herbs, non-prescription drugs, or dietary supplements you use. Also tell them if you smoke, drink alcohol, or use illegal drugs. Some items may interact with your medicine.What should I watch for while using this medicine?Your condition will be monitored carefully while you are receiving this medicine. You will need important blood work done while you are taking this medicine.This medicine can cause serious allergic reactions. To reduce your risk you will need to take other medicine(s) before treatment with this medicine. If you experience allergic reactions like skin rash, itching or hives, swelling of the face, lips, or tongue, tell your doctor or health care professional right away.In some cases, you may be given additional medicines to help with side effects. Follow all directions for their use.This drug may make you feel generally unwell. This is not uncommon, as chemotherapy can affect healthy cells as well as cancer cells. Report any side effects. Continue your course of treatment even though you feel ill unless your doctor tells you to stop.Call your doctor or health care professional for advice if you get a fever, chills or sore throat, or other symptoms of a cold or flu. Do not treat yourself. This drug decreases your body's ability to fight infections. Try to avoid being around people who are sick.This medicine may increase your risk to bruise or bleed. Call your doctor  or health care professional if you notice any unusual bleeding.Be careful brushing and flossing your teeth or using a toothpick because you may get an infection or bleed more easily. If you have any dental work done, tell your dentist you are receiving this medicine.Avoid taking products that contain aspirin, acetaminophen, ibuprofen, naproxen, or ketoprofen unless instructed by your doctor. These medicines may hide a fever.Do not become pregnant while taking this medicine. Women should inform their doctor if they wish to become pregnant or think they might be pregnant. There is a potential for serious side effects to an unborn child. Talk to your health care professional or pharmacist for more information. Do not breast-feed an infant while taking this medicine.Men are advised not to father a child while receiving this medicine.This product may contain alcohol. Ask your pharmacist or healthcare provider if this medicine contains alcohol. Be sure to tell all healthcare providers you are taking this medicine. Certain medicines, like metronidazole and disulfiram, can cause an unpleasant reaction when taken with alcohol. The reaction includes flushing, headache, nausea, vomiting, sweating, and increased thirst. The reaction can last from 30 minutes to several hours.What side effects may I notice from receiving this medicine?Side effects that you should report to your doctor or health care professional as soon as possible:?	allergic reactions like skin rash, itching or hives, swelling of the face, lips, or tongue?	breathing problems?	changes in vision?	fast, irregular heartbeat?	high or low blood pressure?	mouth sores?	pain, tingling, numbness in the hands or feet?	signs of decreased platelets or bleeding - bruising, pinpoint red spots on the skin, black, tarry stools, blood in the urine?	signs of decreased red blood cells - unusually weak or tired, feeling faint or lightheaded, falls?	signs of infection - fever or chills, cough, sore throat, pain or difficulty passing urine?	signs and symptoms of liver injury like dark yellow or brown urine; general ill feeling or flu-like symptoms; light-colored stools; loss of appetite; nausea; right upper belly pain; unusually weak or tired; yellowing of the eyes or skin?	swelling of the ankles, feet, hands?	unusually slow heartbeatSide effects that usually do not require medical attention (report to your doctor or health care professional if they continue or are bothersome):?	diarrhea?	hair loss?	loss of appetite?	muscle or joint pain?	nausea, vomiting?	pain, redness, or irritation at site where injected?	tirednessThis list may not describe all possible side effects. Call your doctor for medical advice about side effects. You may report side effects to FDA at 1-800-FDA-1088.Where should I keep my medicine?This drug is given in a hospital or clinic and will not be stored at home.NOTE: This sheet is a summary. It may not cover all possible information. If you have questions about this medicine, talk to your doctor, pharmacist, or health care provider.? 2020 [employer] Standard (2017-04-16 13:14:55)MON BRAND NAME(S): ParaplatinWhat should I tell my health care provider before I take this medicine?They need to know if you have any of these conditions:?	blood disorders?	hearing problems?	kidney disease?	recent or ongoing radiation therapy?	an unusual or allergic reaction to carboplatin, cisplatin, other chemotherapy, other medicines, foods, dyes, or preservatives?	pregnant or trying to get pregnant?	breast-feedingHow should I use this medicine?This drug is usually given as an infusion into a vein. It is administered in a hospital or clinic by a specially trained health care professional.Talk to your pediatrician regarding the use of this medicine in children. Special care may be needed.Overdosage: If you think you have taken too much of this medicine contact a poison control center or emergency room at once.NOTE: This medicine is only for you. Do not share this medicine with others.What if I miss a dose?It is important not  to miss a dose. Call your doctor or health care professional if you are unable to keep an appointment.What may interact with this medicine??	medicines for seizures?	medicines to increase blood counts like filgrastim, pegfilgrastim, sargramostim?	some antibiotics like amikacin, gentamicin, neomycin, streptomycin, tobramycin?	vaccinesTalk to your doctor or health care professional before taking any of these medicines:?	acetaminophen?	aspirin?	ibuprofen?	ketoprofen?	naproxenThis list may not describe all possible interactions. Give your health care provider a list of all the medicines, herbs, non-prescription drugs, or dietary supplements you use. Also tell them if you smoke, drink alcohol, or use illegal drugs. Some items may interact with your medicine.What should I watch for while using this medicine?Your condition will be monitored carefully while you are receiving this medicine. You will need important blood work done while you are taking this medicine.This drug may make you feel generally unwell. This is not uncommon, as chemotherapy can affect healthy cells as well as cancer cells. Report any side effects. Continue your course of treatment even though you feel ill unless your doctor tells you to stop.In some cases, you may be given additional medicines to help with side effects. Follow all directions for their use.Call your doctor or health care professional for advice if you get a fever, chills or sore throat, or other symptoms of a cold or flu. Do not treat yourself. This drug decreases your body's ability to fight infections. Try to avoid being around people who are sick.This medicine may increase your risk to bruise or bleed. Call your doctor or health care professional if you notice any unusual bleeding.Be careful brushing and flossing your teeth or using a toothpick because you may get an infection or bleed more easily. If you have any dental work done, tell your dentist you are receiving this medicine.Avoid taking products that contain aspirin, acetaminophen, ibuprofen, naproxen, or ketoprofen unless instructed by your doctor. These medicines may hide a fever.Do not become pregnant while taking this medicine. Women should inform their doctor if they wish to become pregnant or think they might be pregnant. There is a potential for serious side effects to an unborn child. Talk to your health care professional or pharmacist for more information. Do not breast-feed an infant while taking this medicine.What side effects may I notice from receiving this medicine?Side effects that you should report to your doctor or health care professional as soon as possible:?	allergic reactions like skin rash, itching or hives, swelling of the face, lips, or tongue?	signs of infection - fever or chills, cough, sore throat, pain or difficulty passing urine?	signs of decreased platelets or bleeding - bruising, pinpoint red spots on the skin, black, tarry stools, nosebleeds?	signs of decreased red blood cells - unusually weak or tired, fainting spells, lightheadedness?	breathing problems?	changes in hearing?	changes in vision?	chest pain?	high blood pressure?	low blood counts - This drug may decrease the number of white blood cells, red blood cells and platelets. You may be at increased risk for infections and bleeding.?	nausea and vomiting?	pain, swelling, redness or irritation at the injection site?	pain, tingling, numbness in the hands or feet?	problems with balance, talking, walking?	trouble passing urine or change in the amount of urineSide effects that usually do not require medical attention (report to your doctor or health care professional if they continue or are bothersome):?	hair loss?	loss of appetite?	metallic taste in the mouth or changes in tasteThis list may not describe all possible side effects. Call your doctor for medical advice about side effects. You may report side effects to FDA at 1-800-FDA-1088.Where should I keep my medicine?This drug is given in a hospital or clinic and will not be stored  at home.NOTE: This sheet is a summary. It may not cover all possible information. If you have questions about this medicine, talk to your doctor, pharmacist, or health care provider.? 2020 [employer] Standard (2007-11-18 14:38:05)Pembrolizumab injectionWhat is this medicine?PEMBROLIZUMAB (pem broe liz ue mab) is used to treat certain types of melanoma, a skin cancer. It targets specific cancer cells and stops the cancer cells from growing.This medicine may be used for other purposes; ask your health care provider or pharmacist if you have questions.COMMON BRAND NAME(S): Deedra Ehrich should I tell my health care provider before I take this medicine?They need to know if you have any of these conditions:-diabetes-immune system problems-inflammatory bowel disease-liver disease-lung or breathing disease-lupus-an unusual or allergic reaction to pembrolizumab, other medicines, foods, dyes, or preservatives-pregnant or trying to get pregnant-breast-feedingHow should I use this medicine?This medicine is for infusion into a vein. It is given by a health care professional in a hospital or clinic setting.A special MedGuide will be given to you before each treatment. Be sure to read this information carefully each time.Talk to your pediatrician regarding the use of this medicine in children. Special care may be needed.Overdosage: If you think you have taken too much of this medicine contact a poison control center or emergency room at once.NOTE: This medicine is only for you. Do not share this medicine with others.What if I miss a dose?It is important not to miss your dose. Call your doctor or health care professional if you are unable to keep an appointment.What may interact with this medicine?Interactions have not been studied.Give your health care provider a list of all the medicines, herbs, non-prescription drugs, or dietary supplements you use. Also tell them if you smoke, drink alcohol, or use illegal drugs. Some items may interact with your medicine.This list may not describe all possible interactions. Give your health care provider a list of all the medicines, herbs, non-prescription drugs, or dietary supplements you use. Also tell them if you smoke, drink alcohol, or use illegal drugs. Some items may interact with your medicine.What should I watch for while using this medicine?Your condition will be monitored carefully while you are receiving this medicine.You may need blood work done while you are taking this medicine.Do not become pregnant while taking this medicine or for 4 months after stopping it. Women should inform their doctor if they wish to become pregnant or think they might be pregnant. There is a potential for serious side effects to an unborn child. Talk to your health care professional or pharmacist for more information. Do not breast-feed an infant while taking this medicine.What side effects may I notice from receiving this medicine?Side effects that you should report to your doctor or health care professional as soon as possible:-allergic reactions like skin rash, itching or hives, swelling of the face, lips, or tongue-bloody or black, tarry stools-breathing problems-change in the amount of urine-changes in vision-chest pain-chills-dark urine-dizziness or feeling faint or lightheaded-fast or irregular heartbeat-fever-flushing-hair loss-muscle pain-muscle weakness-persistent headache-signs and symptoms of high blood sugar such as dizziness; dry mouth; dry skin; fruity breath; nausea; stomach pain; increased hunger or thirst; increased urination-signs and symptoms of liver injury like dark urine, light-colored stools, loss of appetite, nausea, right upper belly pain, yellowing of the eyes or skin-stomach pain-weight lossSide effects that usually do not require medical attention (Report these to your doctor or health care professional if they continue or are bothersome.):constipation-cough-diarrhea-joint pain-tirednessThis list may not describe all possible side effects. Call your doctor for medical advice about side effects. You may report  side effects to FDA at 1-800-FDA-1088.Where should I keep my medicine?This drug is given in a hospital or clinic and will not be stored at home.NOTE: This sheet is a summary. It may not cover all possible information. If you have questions about this medicine, talk to your doctor, pharmacist, or health care provider.? 2015, [employer] Standard. (2014-02-22 08:44:09)Famotidine tablets or gelcapsWhat is this medicine?FAMOTIDINE (fa MOE ti deen) is a type of antihistamine that blocks the release of stomach acid. It is used to treat stomach or intestinal ulcers. It can also relieve heartburn from acid reflux.This medicine may be used for other purposes; ask your health care provider or pharmacist if you have questions.COMMON BRAND NAME(S): Heartburn Relief, Pepcid, Pepcid AC, Pepcid AC Maximum StrengthWhat should I tell my health care provider before I take this medicine?They need to know if you have any of these conditions:?	kidney or liver disease?	trouble swallowing?	an unusual or allergic reaction to famotidine, other medicines, foods, dyes, or preservatives?	pregnant or trying to get pregnant?	breast-feedingHow should I use this medicine?Take this medicine by mouth with a glass of water. Follow the directions on the prescription label. If you only take this medicine once a day, take it at bedtime. Take your doses at regular intervals. Do not take your medicine more often than directed.Talk to your pediatrician regarding the use of this medicine in children. Special care may be needed.Overdosage: If you think you have taken too much of this medicine contact a poison control center or emergency room at once.NOTE: This medicine is only for you. Do not share this medicine with others.What if I miss a dose?If you miss a dose, take it as soon as you can. If it is almost time for your next dose, take only that dose. Do not take double or extra doses.What may interact with this medicine??	delavirdine?	itraconazole?	ketoconazoleThis list may not describe all possible interactions. Give your health care provider a list of all the medicines, herbs, non-prescription drugs, or dietary supplements you use. Also tell them if you smoke, drink alcohol, or use illegal drugs. Some items may interact with your medicine.What should I watch for while using this medicine?Tell your doctor or health care professional if your condition does not start to get better or if it gets worse. Finish the full course of tablets prescribed, even if you feel better.Do not take with aspirin, ibuprofen or other antiinflammatory medicines. These can make your condition worse.Do not smoke cigarettes or drink alcohol. These cause irritation in your stomach and can increase the time it will take for ulcers to heal.If you get black, tarry stools or vomit up what looks like coffee grounds, call your doctor or health care professional at once. You may have a bleeding ulcer.This medicine may cause a decrease in vitamin B12. You should make sure that you get enough vitamin B12 while you are taking this medicine. Discuss the foods you eat and the vitamins you take with your health care professional.What side effects may I notice from receiving this medicine?Side effects that you should report to your doctor or health care professional as soon as possible:?	agitation, nervousness?	confusion?	hallucinations?	skin rash, itchingSide effects that usually do not require medical attention (report to your doctor or health care professional if they continue or are bothersome):?	constipation?	diarrhea?	dizziness?	headacheThis list may not describe all possible side effects. Call your doctor for medical advice about side effects. You may report side effects to FDA at 1-800-FDA-1088.Where should I keep my medicine?Keep out of the reach of children.Store at room temperature between 15 and 30 degrees C (59 and 86  degrees F). Do not freeze. Throw away any unused medicine after the expiration date.NOTE: This sheet is a summary. It may not cover all possible information. If you have questions about this medicine, talk to your doctor, pharmacist, or health care provider.? 2020 [employer] Standard (2017-03-29 13:17:50)Diphenhydramine chewable tabletWhat is this medicine?DIPHENHYDRAMINE (dye fen HYE dra meen) is an antihistamine. It is used to treat the symptoms of an allergic reaction.This medicine may be used for other purposes; ask your health care provider or pharmacist if you have questions.COMMON BRAND NAME(S): Benadryl Allergy, Benadryl Children's Allergy, DiphenMax, Dytan, Uni-TannWhat should I tell my health care provider before I take this medicine?They need to know if you have any of these conditions:?	glaucoma?	high blood pressure?	heart disease?	liver disease?	lung or breathing disease, like asthma?	pain or difficulty passing urine?	phenylketonuria?	prostate trouble?	ulcers or other stomach problems?	an unusual or allergic reaction to diphenhydramine, sulfites, other medicines foods, dyes, or preservatives?	pregnant or trying to get pregnant?	breast-feedingHow should I use this medicine?Take this medicine by mouth. Chew it completely before swallowing. Follow the directions on the prescription label. Take your doses at regular intervals. Do not take your medicine more often than directed.Talk to your pediatrician regarding the use of this medicine in children. While this drug may be prescribed for children as young as 25 years old for selected conditions, precautions do apply.Patients over 67 years old may have a stronger reaction and need a smaller dose.Overdosage: If you think you have taken too much of this medicine contact a poison control center or emergency room at once.NOTE: This medicine is only for you. Do not share this medicine with others.What if I miss a dose?If you miss a dose, take it as soon as you can. If it is almost time for your next dose, take only that dose. Do not take double or extra doses.What may interact with this medicine?Do not take this medicine with any of the following medications:?	MAOIs like Carbex, Eldepryl, Marplan, Nardil, and ParnateThis medicine may also interact with the following medications:?	alcohol?	barbiturates, like phenobarbital?	medicines for bladder spasm like oxybutynin, tolterodine?	medicines for blood pressure?	medicines for depression, anxiety, or psychotic disturbances?	medicines for movement abnormalities or Parkinson's disease?	medicines for sleep?	other medicines for cold, cough or allergy?	some medicines for the stomach like chlordiazepoxide, dicyclomineThis list may not describe all possible interactions. Give your health care provider a list of all the medicines, herbs, non-prescription drugs, or dietary supplements you use. Also tell them if you smoke, drink alcohol, or use illegal drugs. Some items may interact with your medicine.What should I watch for while using this medicine?Visit your doctor or health care professional for regular check ups. Tell your doctor or healthcare professional if your symptoms do not start to get better or if they get worse.Your mouth may get dry. Chewing sugarless gum or sucking hard candy, and drinking plenty of water may help. Contact your doctor if the problem does not go away or is severe.This medicine may cause dry eyes and blurred vision. If you wear contact lenses you may feel some discomfort. Lubricating drops may help. See your eye doctor if the problem does not go away or is severe.You may get drowsy or dizzy. Do not drive, use machinery, or do anything that needs mental alertness until you know how this medicine affects you. Do not stand or sit up quickly, especially if you are an older patient. This reduces the risk of dizzy or fainting spells. Alcohol may interfere with the effect of this medicine. Avoid alcoholic drinks.What side effects may I notice from receiving this medicine?Side effects that you should report  to your doctor or health care professional as soon as possible:?	allergic reactions like skin rash, itching or hives, swelling of the face, lips, or tongue?	changes in vision?	confused, agitated, nervous?	irregular or fast heartbeat?	tremor?	trouble passing urine?	unusual bleeding or bruising?	unusually weak or tiredSide effects that usually do not require medical attention (report to your doctor or health care professional if they continue or are bothersome):?	constipation, diarrhea?	drowsy?	headache?	loss of appetite?	stomach upset, vomiting?	thick mucousThis list may not describe all possible side effects. Call your doctor for medical advice about side effects. You may report side effects to FDA at 1-800-FDA-1088.Where should I keep my medicine?Keep out of the reach of children.Store at room temperature between 15 and 30 degrees C (59 and 86 degrees F). Keep container closed tightly. Throw away any unused medicine after the expiration date.NOTE: This sheet is a summary. It may not cover all possible information. If you have questions about this medicine, talk to your doctor, pharmacist, or health care provider.? 2020 Elsevier/Gold Standard (2007-12-12 16:11:38)Dexamethasone injectionWhat is this medicine?DEXAMETHASONE (dex a METH a sone) is a corticosteroid. It is used to treat inflammation of the skin, joints, lungs, and other organs. Common conditions treated include asthma, allergies, and arthritis. It is also used for other conditions, like blood disorders and diseases of the adrenal glands.This medicine may be used for other purposes; ask your health care provider or pharmacist if you have questions.COMMON BRAND NAME(S): Decadron, DoubleDex, Simplist Dexamethasone, SolurexWhat should I tell my health care provider before I take this medicine?They need to know if you have any of these conditions:?	blood clotting problems?	Cushing's syndrome?	diabetes?	glaucoma?	heart problems or disease?	high blood pressure?	infection like herpes, measles, tuberculosis, or chickenpox?	kidney disease?	liver disease?	mental problems?	myasthenia gravis?	osteoporosis?	previous heart attack?	seizures?	stomach, ulcer or intestine disease including colitis and diverticulitis?	thyroid problem?	an unusual or allergic reaction to dexamethasone, corticosteroids, other medicines, lactose, foods, dyes, or preservatives?	pregnant or trying to get pregnant?	breast-feedingHow should I use this medicine?This medicine is for injection into a muscle, joint, lesion, soft tissue, or vein. It is given by a health care professional in a hospital or clinic setting.Talk to your pediatrician regarding the use of this medicine in children. Special care may be needed.Overdosage: If you think you have taken too much of this medicine contact a poison control center or emergency room at once.NOTE: This medicine is only for you. Do not share this medicine with others.What if I miss a dose?This may not apply. If you are having a series of injections over a prolonged period, try not to miss an appointment. Call your doctor or health care professional to reschedule if you are unable to keep an appointment.What may interact with this medicine?Do not take this medicine with any of the following medications:?	mifepristone, RU-486?	vaccinesThis medicine may also interact with the following medications:?	amphotericin B?	antibiotics like clarithromycin, erythromycin, and troleandomycin?	aspirin and aspirin-like drugs?	barbiturates like phenobarbital?	carbamazepine?	cholestyramine?	cholinesterase inhibitors like donepezil, galantamine, rivastigmine, and tacrine?	cyclosporine?	digoxin?	diuretics?	ephedrine?	male hormones, like estrogens or progestins and birth control pills?	indinavir?	isoniazid?	ketoconazole?	medicines for diabetes?	medicines that improve muscle tone or strength for conditions like myasthenia gravis?	NSAIDs, medicines for pain and inflammation, like ibuprofen or naproxen?	phenytoin?	rifampin?	thalidomide?	warfarinThis list may not describe all possible interactions. Give your health care provider a list of all the medicines, herbs, non-prescription drugs, or dietary supplements you use. Also tell them if you smoke, drink alcohol, or use illegal drugs. Some items may interact with your medicine.What should I watch for while using this medicine?Your condition will be monitored carefully while you are receiving this medicine.If you are taking this medicine for a long time, carry an identification card with your name and address, the type and dose of your medicine, and your doctor's  name and address.This medicine may increase your risk of getting an infection. Stay away from people who are sick. Tell your doctor or health care professional if you are around anyone with measles or chickenpox. Talk to your health care provider before you get any vaccines that you take this medicine.If you are going to have surgery, tell your doctor or health care professional that you have taken this medicine within the last twelve months.Ask your doctor or health care professional about your diet. You may need to lower the amount of salt you eat.This medicine may increase blood sugar. Ask your healthcare provider if changes in diet or medicines are needed if you have diabetes.What side effects may I notice from receiving this medicine?Side effects that you should report to your doctor or health care professional as soon as possible:?	allergic reactions like skin rash, itching or hives, swelling of the face, lips, or tongue?	black or tarry stools?	change in the amount of urine?	confusion, excitement, restlessness, a false sense of well-being?	fever, sore throat, sneezing, cough, or other signs of infection, wounds that will not heal?	hallucinations?	mental depression, mood swings, mistaken feelings of self importance or of being mistreated?	pain in hips, back, ribs, arms, shoulders, or legs?	pain, redness, or irritation at the injection site?	redness, blistering, peeling or loosening of the skin, including inside the mouth?	rounding out of face?	signs and symptoms of high blood sugar such as being more thirsty or hungry or having to urinate more than normal. You may also feel very tired or have blurry vision.?	swelling of feet or lower legs?	unusual bleeding or bruising?	wounds that do not healSide effects that usually do not require medical attention (report to your doctor or health care professional if they continue or are bothersome):?	diarrhea or constipation?	change in taste?	headache?	nausea, vomiting?	skin problems, acne, thin and shiny skin?	trouble sleeping?	unusual growth of hair on the face or body?	weight gainThis list may not describe all possible side effects. Call your doctor for medical advice about side effects. You may report side effects to FDA at 1-800-FDA-1088.Where should I keep my medicine?This drug is given in a hospital or clinic and will not be stored at home.NOTE: This sheet is a summary. It may not cover all possible information. If you have questions about this medicine, talk to your doctor, pharmacist, or health care provider.? 2020 Elsevier/Gold Standard (2018-05-14 10:35:53)Palonosetron InjectionWhat is this medicine?PALONOSETRON (pal oh NOE se tron) is used to prevent nausea and vomiting caused by chemotherapy. It also helps prevent delayed nausea and vomiting that may occur a few days after your treatment.This medicine may be used for other purposes; ask your health care provider or pharmacist if you have questions.COMMON BRAND NAME(S): AloxiWhat should I tell my health care provider before I take this medicine?They need to know if you have any of these conditions:?	an unusual or allergic reaction to palonosetron, dolasetron, granisetron, ondansetron, other medicines, foods, dyes, or preservatives?	pregnant or trying to get pregnant?	breast-feedingHow should I use this medicine?This medicine is for infusion into a vein. It is given by a health care professional in a hospital or clinic setting.Talk to your pediatrician regarding the use of this medicine in children. While this drug may be prescribed for children as young as 1 month for selected conditions, precautions do apply.Overdosage: If you think you have taken too much of this medicine contact a poison control center or emergency room at once.NOTE: This medicine is only for you. Do not share this medicine with others.What if I miss a dose?This does not apply.What may interact with this medicine??	certain medicines for depression, anxiety, or psychotic disturbances?	fentanyl?	linezolid?	MAOIs like Carbex,  Eldepryl, Marplan, Nardil, and Parnate?	methylene blue (injected into a vein)?	tramadolThis list may not describe all possible interactions. Give your health care provider a list of all the medicines, herbs, non-prescription drugs, or dietary supplements you use. Also tell them if you smoke, drink alcohol, or use illegal drugs. Some items may interact with your medicine.What should I watch for while using this medicine?Your condition will be monitored carefully while you are receiving this medicine.What side effects may I notice from receiving this medicine?Side effects that you should report to your doctor or health care professional as soon as possible:?	allergic reactions like skin rash, itching or hives, swelling of the face, lips, or tongue?	breathing problems?	confusion?	dizziness?	fast, irregular heartbeat?	fever and chills?	loss of balance or coordination?	seizures?	sweating?	swelling of the hands and feet?	tremors?	unusually weak or tiredSide effects that usually do not require medical attention (report to your doctor or health care professional if they continue or are bothersome):?	constipation or diarrhea?	headacheThis list may not describe all possible side effects. Call your doctor for medical advice about side effects. You may report side effects to FDA at 1-800-FDA-1088.Where should I keep my medicine?This drug is given in a hospital or clinic and will not be stored at home.NOTE: This sheet is a summary. It may not cover all possible information. If you have questions about this medicine, talk to your doctor, pharmacist, or health care provider.? 2020 Elsevier/Gold Standard (2013-06-19 10:38:36)Aprepitant Emulsion for injectionWhat is this medicine?APREPITANT (ap RE pi tant) is used together with other medicines to prevent and treat nausea and vomiting caused by cancer treatment (chemotherapy).This medicine may be used for other purposes; ask your health care provider or pharmacist if you have questions.COMMON BRAND NAME(S): CINVANTIWhat should I tell my health care provider before I take this medicine?They need to know if you have any of these conditions:?	liver disease?	an unusual or allergic reaction to aprepitant, fosaprepitant, other medicines, foods, dyes, or preservatives?	pregnant or trying to get pregnant?	breast-feedingHow should I use this medicine?This medicine is for injection or infusion into a vein. It is given by a health care professional in a hospital or clinic setting.Talk to your pediatrician regarding the use of this medicine in children. Special care may be needed.Overdosage: If you think you have taken too much of this medicine contact a poison control center or emergency room at once.NOTE: This medicine is only for you. Do not share this medicine with others.What if I miss a dose?This does not apply.What may interact with this medicine?Do not take this medicine with any of these medicines:?	cisapride?	flibanserin?	lomitapide?	pimozideThis medicine may also interact with the following medications:?	diltiazem?	male hormones, like estrogens or progestins and birth control pills?	medicines for fungal infections like ketoconazole and itraconazole?	medicines for HIV?	medicines for seizures or to control epilepsy like carbamazepine or phenytoin?	medicines used for sleep or anxiety disorders like alprazolam, diazepam, or midazolam?	nefazodone?	paroxetine?	ranolazine?	rifampin?	some chemotherapy medications like etoposide, ifosfamide, vinblastine, vincristine?	some antibiotics like clarithromycin, erythromycin, troleandomycin?	steroid medicines like dexamethasone or methylprednisolone?	tolbutamide?	warfarinThis list may not describe all possible interactions. Give your health care provider a list of all the medicines, herbs, non-prescription drugs, or dietary supplements you use. Also tell them if you smoke, drink alcohol, or use illegal drugs. Some items may interact with your medicine.What should I watch for while using this medicine?Do not take this medicine if you already have nausea and vomiting. Ask your health care provider what to do if you already have nausea.Birth control pills and other methods of hormonal contraception (for example, IUD or patch) may not work properly while you are taking this medicine. Use an extra method of birth control during treatment and for 1 month  after your last dose of aprepitant.This medicine should not be used continuously for a long time.Visit your doctor or health care professional for regular check-ups. This medicine may change your liver function blood test results.What side effects may I notice from receiving this medicine?Side effects that you should report to your doctor or health care professional as soon as possible:?	allergic reactions like skin rash, itching or hives, swelling of the face, lips, or tongue?	breathing problems?	changes in heart rhythm?	high or low blood pressure?	pain, redness, or irritation at site where injected?	rectal bleeding?	serious dizziness or disorientation, confusion?	sharp or severe stomach pain?	sharp pain in your legSide effects that usually do not require medical attention (report these to your doctor or health care professional if they continue or are bothersome):?	constipation or diarrhea?	hair loss?	headache?	hiccups?	loss of appetite?	nausea?	upset stomach?	tirednessThis list may not describe all possible side effects. Call your doctor for medical advice about side effects. You may report side effects to FDA at 1-800-FDA-1088.Where should I keep my medicine?This drug is given in a hospital or clinic and will not be stored at home.NOTE: This sheet is a summary. It may not cover all possible information. If you have questions about this medicine, talk to your doctor, pharmacist, or health care provider.? 2020 Elsevier/Gold Standard (2018-06-19 16:40:43)

## 2020-02-11 NOTE — Progress Notes
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 Mcleary is a 69 y.o. gentleman with 75py smoking history, history of CAD s/p MIx2 and PCI, who has now transfered care from [place] to [place] for his stage IV NSCLC.  ?He noticed worsening DOE around November and December 2020.  Chest Xray done in 09/2019 showed bilateral pulmonary nodules and chest Norco done shortly after showed numerous bilateral nodules with dominant left lung nodule measuring 1.4cm, as well as bilateral axillary, hilar and mediastinal nodes.  PET [place] 11/04/19 showed hyperavid lung nodules as well as diffuse adenopathy in neck, chest and upper abdomen.  ?He underwent ultrasound guided biopsy of infraclavicular lymph node on 11/18/19, which showed focal atypical cells concerning for carcinoma.   He subsequently underwent EBUS with biopsy of station 7 node, which was positive for metastatic squamous cell carcinoma.  There was insufficient material for molecular testing or PD-L1 staining.  - 11/28/19: Carbo/Taxol/Pembro - received 2 cycles in [place] prior to transitioning care.Current Treatment:  Carbo/Taxol/Pembro q21 days Interim History: Agam presents today for consideration of cycle 3 carbo/taxol/pembro.  Treatment was held last week for increased dyspnea.  He had echo performed with LVEF of 62%, and CTA which was unremarkable.  Today he is here with his sister-in-law, [name].  He reports that his dyspnea is no better or worse as compared to last week.  Feels it most going up stairs.  None at rest.  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  Has apt with smoking cessation tomorrow.  Continues to endorse fatigue; takes a 2h nap daily.  No leg pain or edema. Normal BMs.  Review of Systems: Pertinent items are noted in HPI.  A 12-point review of systems was reviewed and is otherwise negative except as stated in the HPI.Allergies: Patient has no known allergies.Medications: Current Outpatient Medications Medication Sig ? albuterol sulfate 90 mcg/actuation HFA aerosol inhaler  ? ANORO ELLIPTA 62.5-25 mcg/actuation blister powder for inhalation  ? aspirin 81 MG EC tablet Take 81 mg by mouth daily. ? atorvastatin (LIPITOR) 80 mg tablet TAKE 1 TABLET BY MOUTH ONCE DAILY FOR 90 DAYS ? escitalopram oxalate (LEXAPRO) 10 mg tablet TAKE 1 TABLET BY MOUTH ONCE DAILY FOR 90 DAYS ? gabapentin (NEURONTIN) 300 mg capsule TAKE 1 CAPSULE BY MOUTH ONCE DAILY AT BEDTIME ? prochlorperazine (COMPAZINE) 10 mg tablet Take 1 tablet (10 mg total) by mouth every 6 (six) hours as needed for nausea. No current facility-administered medications for this visit.  Exam: ECOG Performance Status: 1Vitals:Temp:  [97.9 ?F (36.6 ?C)] 97.9 ?F (36.6 ?C)Pulse:  [60] 60Resp:  [20] 20BP: (151)/(78) 151/78SpO2:  [98 %] 98 %Wt: 02/11/20 131.8 kg 02/04/20 131.5 kg 01/18/20 132.9 kg 08/14/11 125.6 kg 03/30/11 123.4 kg  General:  Well appearing man in NADHEENT: No cervical or supraclavicular adenopathyChest: CTABCor: RRR, no MRGAbd: Soft, nontender, nondistended, +BSExt: Warm, no edema or calf tendernessSkin: No rashesNeuro: AOx3, grossly nonfocalLaboratory Data: Results for orders placed or performed during the hospital encounter of 02/11/20 Comprehensive metabolic panel Result Value Ref Range  Sodium 140 136 - 144 mmol/L  Potassium 4.2 3.3 - 5.1 mmol/L  Chloride 106 98 - 107 mmol/L  CO2 26 20 - 30 mmol/L  Anion Gap 8 7 - 17  Glucose 104 (H) 70 - 100 mg/dL  BUN 14 8 - 23 mg/dL  Creatinine 8.84 (H) 1.66 - 1.30 mg/dL  Calcium 9.2 8.8 - 10.2 mg/dL  BUN/Creatinine Ratio 9.9 8.0 - 23.0  Total Protein 7.0 6.6 - 8.7 g/dL  Albumin 3.7 3.6 - 4.9 g/dL  Total Bilirubin 0.3 <=0.9 mg/dL  Alkaline Phosphatase 92 9 - 122 U/L  Alanine Aminotransferase (ALT) 19 9 - 59 U/L  Aspartate Aminotransferase (AST) 13 10 - 35 U/L  Globulin 3.3 2.3 - 3.5 g/dL  A/G Ratio 1.1 1.0 - 2.2  AST/ALT Ratio 0.7 See Comment  eGFR (Afr Amer) 60 >60 mL/min/1.45m2  eGFR (NON African-American) 50 >60 mL/min/1.40m2 CBC auto differential Result Value Ref Range  WBC 6.0 4.0 - 10.0 x1000/?L  RBC 4.0 3.8 - 5.9 M/?L  Hemoglobin 11.9 (L) 12.0 - 18.0 g/dL  Hematocrit 81.1 (L) 91.4 - 52.0 %  MCV 90.6 78.0 - 94.0 fL  MCHC 32.5 31.0 - 36.0 g/dL  RDW-CV 78.2 (H) 95.6 - 14.5 %  Platelets 220 140 - 440 x1000/?L  MPV 9.1 6.0 - 11.0 fL  ANC (Abs Neutrophil Count) 3.7 1.0 - 11.0 x 1000/?L  Neutrophils 61.8 37.0 - 84.0 %  Lymphocytes 28.8 8.0 - 49.0 %  Absolute Lymphocyte Count 1.7 1.0 - 4.0 x 1000/?L  Monocytes 7.5 4.0 - 15.0 %  Monocyte Absolute Count 0.5 0.0 - 2.0 x 1000/?L  Eosinophils 1.0 0.0 - 7.0 %  Eosinophil Absolute Count 0.1 0.0 - 1.0 x 1000/?L  Basophil 0.7 0.0 - 4.0 %  Basophil Absolute Count 0.0 0.0 - 0.0 x 1000/?L  Immature Granulocytes 0.2 0.0 - 3.0 %  Absolute Immature Granulocyte Count 0.0 0.0 - 0.3 x 1000/?L  nRBC 0.0 0.0 - 1.0 %  Absolute nRBC 0.0 0.0 - 0.0 x 1000/?L  MCH 29.5 27.0 - 31.0 pg  Diagnostic Imaging: None for this visit Impression: Mr. Pickerill is a 69 y.o. man with 75py smoking history, history of CAD s/p MIx2 and PCI, diagnosed with stage IV NSCLC in the fall of 2020. ?He was started on treatment with Carbo/Taxol/Pembrolizumab on 12/25/19 in [place] where he received 2 cycles.  He has tolerated treatment relatively well overall, but has developed some neuropathy following the second cycle.  Will proceed with 20% dose reduction of taxol for neuropathy.   Progressive dyspnea of unclear etiology.  Have referred to cardiology previously and will refer to pulmonology today to establish care.  Plan: - SCC: Proceed today with cycle 3 (taxol with 20% dose reduction).  Will plan on restaging after cycle 4.  RTC in 4 weeks for cycle 4 (one week delay due to planned vacation).      - Dyspnea: CTA and echo unrevealing.  Referred to pulm today.  Deconditioning may be playing a role; have encouraged increased physical activity as tolerated, and recommended that he try to limit daily napping as he is able.    - Elevated Cr: 1.42, appears around his baseline.  - Spinal stenosis: referred to ortho at last visit, scheduled for 9/8.  - h/o CAD and CHF: referred to cardiology at last visit, scheduled for 8/13.  He knows to call the office with any new or worsening symptoms.  Deidre Ala, APRN

## 2020-02-12 ENCOUNTER — Ambulatory Visit: Admit: 2020-02-12 | Payer: PRIVATE HEALTH INSURANCE

## 2020-02-12 ENCOUNTER — Encounter: Admit: 2020-02-12 | Payer: PRIVATE HEALTH INSURANCE

## 2020-02-12 MED ORDER — NICOTINE 10 MG INHALATION CARTRIDGE
10 mg | 4 refills | Status: AC
Start: 2020-02-12 — End: ?

## 2020-02-12 MED ORDER — NICOTINE 21 MG/24 HR DAILY TRANSDERMAL PATCH
21 mg | MEDICATED_PATCH | 3 refills | Status: AC
Start: 2020-02-12 — End: ?

## 2020-02-12 NOTE — Progress Notes
[facility] Tobacco CessationEncounter Date: 02/12/2020 Primary Care Provider: Loura Back, PAReferring/Attending Physician: Layla Barter MDNicotine CessationRecommendation based on medical history and patient preference: Nicotine Inhaler and Nicotine Patches.Paul Hunter has 55 yr smoking history with 1~2 packs a day, most recently 1 pack.  He's tried chantix, patches, hypnosis, acupunture etc etc.  With recent diagnosis of lung cancer, he's about 7~8 out of 10 to quit smoking.   He has 2 kids and 2 grand kids that he wants to spend more time with.  Based on our conversation, we've decided on using nicotine patches for long term nicotine replacement along with nicotine inhalers for acute craving.  We've established that he'll try to work on replacing morning coffee/tobacco with inhaler.  If he can, he'll try to replace after meal tobacco with inhaler also.   He used to enjoy backyard activities, but shortness or breath excludes outside enjoyment.   Jarryn will try to come up with how he can be mindful about boredom smoking for our follow up conversation.Follow-Up: in 2 week(s)Judi Jaffe Mickel Fuchs, PharmD Clinical Pharmacist3:22 PM 02/12/2020

## 2020-02-16 ENCOUNTER — Encounter: Admit: 2020-02-16 | Payer: PRIVATE HEALTH INSURANCE | Attending: Pulmonary Disease

## 2020-02-16 DIAGNOSIS — J449 Chronic obstructive pulmonary disease, unspecified: Secondary | ICD-10-CM

## 2020-02-25 ENCOUNTER — Encounter: Admit: 2020-02-25 | Payer: PRIVATE HEALTH INSURANCE | Attending: Hematology

## 2020-02-26 ENCOUNTER — Ambulatory Visit: Admit: 2020-02-26 | Payer: PRIVATE HEALTH INSURANCE

## 2020-02-26 ENCOUNTER — Encounter: Admit: 2020-02-26 | Payer: PRIVATE HEALTH INSURANCE

## 2020-02-26 DIAGNOSIS — C3490 Malignant neoplasm of unspecified part of unspecified bronchus or lung: Secondary | ICD-10-CM

## 2020-02-26 NOTE — Progress Notes
Oncology Nutrition AssessmentLocation of visit: [facility] NHType of visit: Initial phone Reason for consult: Pt questionsReferred by: Dr. Riley Kill: NSCLCTreatment plan: Carbo/taxol/pembroPast Medical History: reviewed available data Past Medical History: Diagnosis Date ? Anxiety  ? CAD (coronary artery disease)  ? Diabetes mellitus (HC Code)  ? GERD (gastroesophageal reflux disease)  ? Hyperlipidemia  ? Hypertension  ? Hypertrophy of prostate without urinary obstruction and other lower urinary tract symptoms (LUTS)  ? Other testicular hypofunction  ? Spinal stenosis 09/28/2018 ? Tobacco abuse  ? Vitamin D deficiency   Nutritionally relevant medications/supplements: reviewedNutritionally relevant labs: reviewed from 02/11/20.BMI: Estimated body mass index is 39.4 kg/m? as calculated from the following:  Height as of 02/04/20: 6' (1.829 m).  Weight as of 02/11/20: 131.8 kg.   IBW: 89kg/195.8# ([place]+10%)Usual body weight: 310-315# (140.9-143kg) as of Nov 2020.Weight changes and assessment: Pt estimates 18# unintentional loss (5.8%) x 6-8 months, not significant for timeframe. Current wt reflects 93% UBW. Limited Epic wt hx.Wt: 02/11/20 131.8 kg 02/04/20 131.5 kg 01/18/20 132.9 kg 08/14/11 125.6 kg 03/30/11 123.4 kg Nutrition Focused Physical Findings: Unable to assess by phone.Nutritional Needs: 2225-2670 calories (25-30kcal/kg), 107-133g pro (1.2-1.5g protein/kg ) Based on CA with treatment, desired wt loss, IBW 89kg ([place] +10%). Diet history: Typical intake reviewed, includes yogurt with fresh fruit and homemade granola, pasta with meat sauce and small salad, enjoys desserts like ice cream sundaes and italian cakes; Diet appears adequate for wt maintenance.Food Allergies/intolerances or aversions: NKFA, avoids milk to drink for suspected intolerance (Lactaid, yogurt, ice cream okay).Cultural/lifestyle/family needs: Discussed [place] food heritage. Support system: Sons, family present and supportiveActivity level and/or physical limitations: Ambulatory but limited by dyspnea, muscle weakness, fatigue.Nutrition assessment: Met with pt by phone. Pt denies most side effects during chemo, but reports reduced appetite, dyspnea, and fatigue that interfere with intake ~6 months. Pt reports 2 substantial meals/day (late breakfast and dinner) and occasional snacks. Pt admits inadequate vegetable intake, and describes enjoying frequent desserts after dinner. Pt drinks >6 cups/day caffeinated coffee (denies interference with sleep), seltzer water, vitamin waters. Pt has really enjoyed smoothies in the past. Pt c/o muscle weakness in legs that interferes with taking walks or stairs, increasing activity level, although knows this may help improve energy and appetite per discussions with Dr Layla Barter and team. Discussed estimated kcal and pro needs, optimal diet during tx, all questions addressed. Nutrition Diagnosis: Food and nutrition-related knowledge deficit related to healthy appropriate diet during tx aeb pt questions.Nutrition Intervention: General/healthful diet, Modify distribution, type, or amount of food and nutrients, Specific foods/beverages or groups, Commercial beverage, Nutrition relationship to health/disease.- Provided daily targets for estimated kcal and pro needs per pt request.- Discussed benefits of trialing slightly lower carbohydrate dietary pattern to see if it helps reduce respiratory effort (lower respiratory quotient), reviewed examples using MyPlate method to reduce portions of starches and desserts, and increase portions of lean proteins, healthy fats, vegetables and fruits, pt receptive.- Discussed smoothies with protein as nutrient dense snack to incorporate as alternative to skipped meals iso low appetite per pt preferences.- Suggested protein waters like Isopure or Protein 2o to promote hydration and protein intake with fewer carbohydrates vs juice, other protein shakes. - Reinforced importance of nutrition and hydration to maintain wt, preserve muscle mass, and tolerate treatment. Goals by next nutrition visit:- Pt to trial nutrient dense smoothie with protein on 4 of 7 days of the week.- Pt to name +3 nutrient dense protein sources in next recall.Education Materials provided: [employer].Nutrition monitoring and  evaluation: Liquid meal replacement or supplement , Amount of food, Weight.Learning assessment barriers: none identifiedComprehension level: good, pt demonstrated via questions, teachback, and engaged in discussion.Expected compliance: good, as symptoms allow, and pt verbalized understanding of above plan.Follow up:As needed: Provided contact information and encouraged to call with questions and consult via team.Length of time spent with pt:  	45 minutesSeen ZO:[id number] Signed by Chancy Milroy, RD, July 2, 2021DESK [phone]

## 2020-03-10 ENCOUNTER — Ambulatory Visit: Admit: 2020-03-10 | Payer: PRIVATE HEALTH INSURANCE | Attending: Hematology

## 2020-03-10 ENCOUNTER — Encounter: Admit: 2020-03-10 | Payer: PRIVATE HEALTH INSURANCE | Attending: Hematology

## 2020-03-10 ENCOUNTER — Ambulatory Visit: Admit: 2020-03-10 | Payer: PRIVATE HEALTH INSURANCE

## 2020-03-10 ENCOUNTER — Inpatient Hospital Stay: Admit: 2020-03-10 | Discharge: 2020-03-10 | Payer: PRIVATE HEALTH INSURANCE

## 2020-03-10 DIAGNOSIS — Z5111 Encounter for antineoplastic chemotherapy: Secondary | ICD-10-CM

## 2020-03-10 DIAGNOSIS — I2510 Atherosclerotic heart disease of native coronary artery without angina pectoris: Secondary | ICD-10-CM

## 2020-03-10 DIAGNOSIS — Z79899 Other long term (current) drug therapy: Secondary | ICD-10-CM

## 2020-03-10 DIAGNOSIS — Z5112 Encounter for antineoplastic immunotherapy: Secondary | ICD-10-CM

## 2020-03-10 DIAGNOSIS — C3490 Malignant neoplasm of unspecified part of unspecified bronchus or lung: Secondary | ICD-10-CM

## 2020-03-10 DIAGNOSIS — N400 Enlarged prostate without lower urinary tract symptoms: Secondary | ICD-10-CM

## 2020-03-10 DIAGNOSIS — I509 Heart failure, unspecified: Secondary | ICD-10-CM

## 2020-03-10 DIAGNOSIS — M4800 Spinal stenosis, site unspecified: Secondary | ICD-10-CM

## 2020-03-10 DIAGNOSIS — Z87891 Personal history of nicotine dependence: Secondary | ICD-10-CM

## 2020-03-10 DIAGNOSIS — E559 Vitamin D deficiency, unspecified: Secondary | ICD-10-CM

## 2020-03-10 DIAGNOSIS — E119 Type 2 diabetes mellitus without complications: Secondary | ICD-10-CM

## 2020-03-10 DIAGNOSIS — F419 Anxiety disorder, unspecified: Secondary | ICD-10-CM

## 2020-03-10 DIAGNOSIS — G629 Polyneuropathy, unspecified: Secondary | ICD-10-CM

## 2020-03-10 DIAGNOSIS — Z720 Tobacco use: Secondary | ICD-10-CM

## 2020-03-10 DIAGNOSIS — Z716 Tobacco abuse counseling: Secondary | ICD-10-CM

## 2020-03-10 DIAGNOSIS — R0600 Dyspnea, unspecified: Secondary | ICD-10-CM

## 2020-03-10 DIAGNOSIS — M19049 Primary osteoarthritis, unspecified hand: Secondary | ICD-10-CM

## 2020-03-10 DIAGNOSIS — E785 Hyperlipidemia, unspecified: Secondary | ICD-10-CM

## 2020-03-10 DIAGNOSIS — Z7951 Long term (current) use of inhaled steroids: Secondary | ICD-10-CM

## 2020-03-10 DIAGNOSIS — I252 Old myocardial infarction: Secondary | ICD-10-CM

## 2020-03-10 DIAGNOSIS — E291 Testicular hypofunction: Secondary | ICD-10-CM

## 2020-03-10 DIAGNOSIS — M2550 Pain in unspecified joint: Secondary | ICD-10-CM

## 2020-03-10 DIAGNOSIS — I10 Essential (primary) hypertension: Secondary | ICD-10-CM

## 2020-03-10 DIAGNOSIS — Z7982 Long term (current) use of aspirin: Secondary | ICD-10-CM

## 2020-03-10 DIAGNOSIS — K219 Gastro-esophageal reflux disease without esophagitis: Secondary | ICD-10-CM

## 2020-03-10 LAB — CBC WITH AUTO DIFFERENTIAL
BKR WAM ABSOLUTE IMMATURE GRANULOCYTES: 0 x 1000/??L (ref 0.0–0.3)
BKR WAM ABSOLUTE LYMPHOCYTE COUNT: 1.7 x 1000/??L (ref 1.0–4.0)
BKR WAM ABSOLUTE NRBC: 0 x 1000/ÂµL (ref 0.0–0.0)
BKR WAM ANALYZER ANC: 5 x 1000/ÂµL (ref 1.0–11.0)
BKR WAM BASOPHIL ABSOLUTE COUNT: 0.1 x 1000/ÂµL — ABNORMAL HIGH (ref 0.0–0.0)
BKR WAM EOSINOPHIL ABSOLUTE COUNT: 0.1 x 1000/??L (ref 0.0–1.0)
BKR WAM EOSINOPHILS: 1.2 % (ref 0.0–7.0)
BKR WAM HEMATOCRIT: 34.6 % — ABNORMAL LOW (ref 37.0–52.0)
BKR WAM HEMOGLOBIN: 11.8 g/dL — ABNORMAL LOW (ref 12.0–18.0)
BKR WAM IMMATURE GRANULOCYTES: 0.3 % (ref 0.0–3.0)
BKR WAM LYMPHOCYTES: 22.9 % (ref 8.0–49.0)
BKR WAM MCH (PG): 31.1 pg — ABNORMAL HIGH (ref 27.0–31.0)
BKR WAM MCHC: 34.1 g/dL (ref 31.0–36.0)
BKR WAM MCV: 91.1 fL (ref 78.0–94.0)
BKR WAM MONOCYTE ABSOLUTE COUNT: 0.7 x 1000/ÂµL (ref 0.0–2.0)
BKR WAM MONOCYTES: 9 % (ref 4.0–15.0)
BKR WAM MPV: 10.4 fL (ref 6.0–11.0)
BKR WAM NEUTROPHILS: 65.9 % — ABNORMAL LOW (ref 37.0–84.0)
BKR WAM NUCLEATED RED BLOOD CELLS: 0 % (ref 0.0–1.0)
BKR WAM RDW-CV: 17 % — ABNORMAL HIGH (ref 11.5–14.5)
BKR WAM RED BLOOD CELL COUNT: 3.8 M/??L (ref 3.8–5.9)
BKR WAM WHITE BLOOD CELL COUNT: 7.6 x1000/??L (ref 4.0–10.0)

## 2020-03-10 LAB — COMPREHENSIVE METABOLIC PANEL
BKR A/G RATIO: 1.2 % (ref 1.0–2.2)
BKR ALANINE AMINOTRANSFERASE (ALT): 19 U/L (ref 9–59)
BKR ALBUMIN: 3.8 g/dL (ref 3.6–4.9)
BKR ALKALINE PHOSPHATASE: 85 U/L (ref 9–122)
BKR ANION GAP: 8 % — ABNORMAL HIGH (ref 7–17)
BKR ASPARTATE AMINOTRANSFERASE (AST): 13 U/L (ref 10–35)
BKR AST/ALT RATIO: 0.7
BKR BILIRUBIN TOTAL: 0.2 mg/dL (ref <=1.2)
BKR BLOOD UREA NITROGEN: 18 mg/dL (ref 8–23)
BKR BUN / CREAT RATIO: 12.9 % (ref 8.0–23.0)
BKR CALCIUM: 8.5 mg/dL — ABNORMAL LOW (ref 8.8–10.2)
BKR CHLORIDE: 104 mmol/L (ref 98–107)
BKR CO2: 23 mmol/L (ref 20–30)
BKR CREATININE: 1.39 mg/dL — ABNORMAL HIGH (ref 0.40–1.30)
BKR EGFR (AFR AMER): >60 mL/min/{1.73_m2} (ref >60)
BKR EGFR (NON AFRICAN AMERICAN): 51 mL/min/{1.73_m2} (ref >60)
BKR GLOBULIN: 3.2 g/dL — ABNORMAL HIGH (ref 2.3–3.5)
BKR GLUCOSE: 103 mg/dL — ABNORMAL HIGH (ref 70–100)
BKR POTASSIUM: 4.1 mmol/L — ABNORMAL LOW (ref 3.3–5.1)
BKR PROTEIN TOTAL: 7 g/dL (ref 6.6–8.7)
BKR SODIUM: 135 mmol/L — ABNORMAL LOW (ref 136–144)
BKR WAM BASOPHILS: 13 U/L (ref 10–35)
BKR WAM PLATELETS: 103 mg/dL — ABNORMAL HIGH (ref 70–100)

## 2020-03-10 MED ORDER — LOPERAMIDE 2 MG CAPSULE
2 mg | Freq: Once | ORAL | Status: DC
Start: 2020-03-10 — End: 2020-03-13

## 2020-03-10 MED ORDER — PALONOSETRON 0.25 MG/5 ML INTRAVENOUS SOLUTION
0.25 mg/5 mL | Freq: Once | INTRAVENOUS | Status: CP
Start: 2020-03-10 — End: ?
  Administered 2020-03-10: 14:00:00 0.25 mL via INTRAVENOUS

## 2020-03-10 MED ORDER — MULTIVITAMIN CAPSULE
Freq: Every day | ORAL | Status: AC
Start: 2020-03-10 — End: 2020-06-03

## 2020-03-10 MED ORDER — DIPHENHYDRAMINE 50 MG/ML INJECTION SOLUTION
50 mg/mL | Freq: Once | INTRAVENOUS | Status: DC | PRN
Start: 2020-03-10 — End: 2020-03-10

## 2020-03-10 MED ORDER — MEPERIDINE 25 MG/2.5 ML IN 0.9% SODIUM CHLORIDE
Freq: Once | INTRAVENOUS | Status: DC | PRN
Start: 2020-03-10 — End: 2020-03-10

## 2020-03-10 MED ORDER — FAMOTIDINE 4 MG/ML IN STERILE WATER (ADULT)
Freq: Once | INTRAVENOUS | Status: DC | PRN
Start: 2020-03-10 — End: 2020-03-10

## 2020-03-10 MED ORDER — PEMBROLIZUMAB INFUSION
Freq: Once | INTRAVENOUS | Status: CP
Start: 2020-03-10 — End: ?
  Administered 2020-03-10: 14:00:00 100.000 mL/h via INTRAVENOUS

## 2020-03-10 MED ORDER — CARBOPLATIN INFUSION (BY AUC) (DOSE>=125MG)
Freq: Once | INTRAVENOUS | Status: CP
Start: 2020-03-10 — End: ?
  Administered 2020-03-10: 18:00:00 250.000 mL/h via INTRAVENOUS

## 2020-03-10 MED ORDER — PACLITAXEL NON-RANGED DOSE INFUSION
Freq: Once | INTRAVENOUS | Status: CP
Start: 2020-03-10 — End: ?
  Administered 2020-03-10: 15:00:00 500.000 mL/h via INTRAVENOUS

## 2020-03-10 MED ORDER — DIPHENHYDRAMINE 25 MG CAPSULE
25 mg | Freq: Once | ORAL | Status: CP
Start: 2020-03-10 — End: ?
  Administered 2020-03-10: 14:00:00 25 mg via ORAL

## 2020-03-10 MED ORDER — EPINEPHRINE 0.3 MG/0.3 ML INJECTION, AUTO-INJECTOR
0.30.3 mg/ mL | INTRAMUSCULAR | Status: DC | PRN
Start: 2020-03-10 — End: 2020-03-10

## 2020-03-10 MED ORDER — FAMOTIDINE 20 MG TABLET
20 mg | Freq: Once | ORAL | Status: CP
Start: 2020-03-10 — End: ?
  Administered 2020-03-10: 14:00:00 20 mg via ORAL

## 2020-03-10 MED ORDER — SODIUM CHLORIDE 0.9 % BOLUS (NEW BAG)
0.9 % | Freq: Once | INTRAVENOUS | Status: DC | PRN
Start: 2020-03-10 — End: 2020-03-10

## 2020-03-10 MED ORDER — APREPITANT 7.2 MG/ML INTRAVENOUS EMULSION
7.2 mg/mL | Freq: Once | INTRAVENOUS | Status: CP
Start: 2020-03-10 — End: ?
  Administered 2020-03-10: 14:00:00 7.2 mL via INTRAVENOUS

## 2020-03-10 MED ORDER — TUMERIC 100 MG-GINGER 150 MG-OLIVE 50 MG-OREG 150 MG-CAPRYLATE CAPSULE
100 mg-150 mg- 50 mg-150 mg | ORAL | Status: AC
Start: 2020-03-10 — End: 2020-05-19

## 2020-03-10 MED ORDER — DEXAMETHASONE SODIUM PHOSPHATE (PF) 10 MG/ML INJECTION SOLUTION
10 mg/mL | Freq: Once | INTRAVENOUS | Status: CP
Start: 2020-03-10 — End: ?
  Administered 2020-03-10: 14:00:00 10 mL via INTRAVENOUS

## 2020-03-10 MED ORDER — HYDROCORTISONE SODIUM SUCCINATE 100 MG SOLUTION FOR INJECTION
100 mg | Freq: Once | INTRAVENOUS | Status: DC | PRN
Start: 2020-03-10 — End: 2020-03-10

## 2020-03-10 NOTE — Progress Notes
Pt arrived for C4 D1 of Pembrolizumab + Paclitaxel + Carboplatin.  Pt was seen in the lab, port accessed and labs drawn.  Pt was seen and cleared by provider..  Pt denies any complaints at this time, baseline is SOBOE, no home oxygen; CPAP at night.  Pre medicated with po Benadryl and Pepcid.  IVP of Dexamethasone, Aloxi, and Cinvanti.Pembrolizumab 200 mg given onver 30 minutes, flushed per policy.  Paclitaxel 336 mg given over 3 hours.Carboplatin 500 mg given over 30 minutes.  Pt tolerated all infusions without incident, good blood return noted pre and post all infusions.Pt declined AVS, uses My Chart.  Instructed to call clinic with any changes in status or questions, verbalized understanding.?

## 2020-03-11 ENCOUNTER — Encounter: Admit: 2020-03-11 | Payer: PRIVATE HEALTH INSURANCE | Attending: Family

## 2020-03-11 ENCOUNTER — Telehealth: Admit: 2020-03-11 | Payer: PRIVATE HEALTH INSURANCE | Attending: Hematology

## 2020-03-11 DIAGNOSIS — Z01818 Encounter for other preprocedural examination: Secondary | ICD-10-CM

## 2020-03-11 MED ORDER — TRAMADOL 50 MG TABLET
50 mg | ORAL_TABLET | Freq: Four times a day (QID) | ORAL | 1 refills | Status: SS | PRN
Start: 2020-03-11 — End: 2020-05-10

## 2020-03-11 NOTE — Telephone Encounter
RTC to patient.  He states that he has extreme muscle and joint pain.  He states that it happens after every treatment, but the SE's came quicker this time.  He is extremely exhausted along with the pain.  He is unable to take Tylenol or Motrin secondary to his kidney function and has no other pain medication at home.  Discussed with Dr. Layla Barter and Gabriel Rung, APRN.  Decision was made to have the patient take Clariten to start and if this does not help he can take Tramadol.  Prescription was sent in for the Tramadol.  Patient is in agreement with the plan of care.  Counseled to call if he does not get relief or if he has any other issues or concerns.  Informed him that there is an on call person 24/7 .

## 2020-03-11 NOTE — Telephone Encounter
Onalee Hua called, he had chemo yesterday & has some concerns, please adviseProvider is Dr Lehman Prom

## 2020-03-13 ENCOUNTER — Encounter: Admit: 2020-03-13 | Payer: PRIVATE HEALTH INSURANCE | Attending: Hematology

## 2020-03-13 DIAGNOSIS — E785 Hyperlipidemia, unspecified: Secondary | ICD-10-CM

## 2020-03-13 DIAGNOSIS — F419 Anxiety disorder, unspecified: Secondary | ICD-10-CM

## 2020-03-13 DIAGNOSIS — Z720 Tobacco use: Secondary | ICD-10-CM

## 2020-03-13 DIAGNOSIS — E559 Vitamin D deficiency, unspecified: Secondary | ICD-10-CM

## 2020-03-13 DIAGNOSIS — I10 Essential (primary) hypertension: Secondary | ICD-10-CM

## 2020-03-13 DIAGNOSIS — M4800 Spinal stenosis, site unspecified: Secondary | ICD-10-CM

## 2020-03-13 DIAGNOSIS — N400 Enlarged prostate without lower urinary tract symptoms: Secondary | ICD-10-CM

## 2020-03-13 DIAGNOSIS — E291 Testicular hypofunction: Secondary | ICD-10-CM

## 2020-03-13 DIAGNOSIS — E119 Type 2 diabetes mellitus without complications: Secondary | ICD-10-CM

## 2020-03-13 DIAGNOSIS — K219 Gastro-esophageal reflux disease without esophagitis: Secondary | ICD-10-CM

## 2020-03-13 DIAGNOSIS — I2510 Atherosclerotic heart disease of native coronary artery without angina pectoris: Secondary | ICD-10-CM

## 2020-03-13 NOTE — Progress Notes
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 gentleman with 75py smoking history, history of CAD s/p MIx2 and PCI, who has now transfered care from [place] to [place] for his stage IV NSCLC.  ?He noticed worsening DOE around November and December 2020.  Chest Xray done in 09/2019 showed bilateral pulmonary nodules and chest Allen done shortly after showed numerous bilateral nodules with dominant left lung nodule measuring 1.4cm, as well as bilateral axillary, hilar and mediastinal nodes.  PET [facility] 11/04/19 showed hyperavid lung nodules as well as diffuse adenopathy in neck, chest and upper abdomen.  ?He underwent ultrasound guided biopsy of infraclavicular lymph node on 11/18/19, which showed focal atypical cells concerning for carcinoma.   He subsequently underwent EBUS with biopsy of station 7 node, which was positive for metastatic squamous cell carcinoma.  There was insufficient material for molecular testing or PD-L1 staining.  - 11/28/19: Carbo/Taxol/Pembro - received 2 cycles in [place] prior to transitioning care.Current Treatment:  Carbo/Taxol/Pembro q21 days Interim History: Paul Hunter presents today for consideration of cycle 4 carbo/taxol/pembro. He is accompanied by his sister-in-law, [name]. He is overall feeling pretty well. He states that in the week following his last cycle, he experienced intermittent, severe full-body bone pains, which subsequently resolved after 4-5 days. The pain initially started the day following chemotherapy, peaked in intensity on day 3, then, then self-resolved around day 5. He did not take any pain medications for the pains. He does continue to tire easily, particularly in the week following therapy. His shortness of breath with exertion is overall stable. He continues to use his CPAP nightly. 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 He does feel his fingers throbbing by the end of the day. He was seen by the Smoking Cessation team, though states that there was a $500 copay with NRTs. He is awaiting a callback about whether further insurance coverage/financial assistance was approved. Down to 1.5>1ppd on his own. Also met with Nutrition, and has modified his diet for weight management. Denies anorexia, nausea, vomiting, CP, abdominal pain, constipation, diarrhea, or rashes. Review of Systems: Pertinent items are noted in HPI.  A 12-point review of systems was reviewed and is otherwise negative except as stated in the HPI.Allergies: Patient has no known allergies.Medications: Current Outpatient Medications Medication Sig ? aspirin 81 MG EC tablet Take 81 mg by mouth daily. ? atorvastatin (LIPITOR) 80 mg tablet TAKE 1 TABLET BY MOUTH ONCE DAILY FOR 90 DAYS ? escitalopram oxalate (LEXAPRO) 10 mg tablet TAKE 1 TABLET BY MOUTH ONCE DAILY FOR 90 DAYS ? gabapentin (NEURONTIN) 300 mg capsule TAKE 1 CAPSULE BY MOUTH ONCE DAILY AT BEDTIME ? multivitamin capsule Take 1 capsule by mouth daily. ? tumeric-ging-olive-oreg-capryl 100 mg-150 mg- 50 mg-150 mg Cap Take by mouth. ? albuterol sulfate 90 mcg/actuation HFA aerosol inhaler  ? ANORO ELLIPTA 62.5-25 mcg/actuation blister powder for inhalation  ? nicotine (NICODERM CQ) 21 mg transdermal patch Apply 1 patch daily. Rotate patch sites. Leave on for 24 hours unless otherwise directed. (Patient not taking: Reported on 03/10/2020) ? nicotine (NICOTROL) 10 mg inhaler Use 1 cartridge every 1-2 hours as needed. DON'T INHALE INTO LUNGS. Continuously puff in short breaths for 20 minutes. Max 16 cartridges/day (Patient not taking: Reported on 03/10/2020) ? prochlorperazine (COMPAZINE) 10 mg tablet Take 1 tablet (  10 mg total) by mouth every 6 (six) hours as needed for nausea. (Patient not taking: Reported on 03/10/2020) No current facility-administered medications for this visit.  Exam: ECOG Performance Status: 1Vitals:Temp:  [97.8 ?F (36.6 ?C)] 97.8 ?F (36.6 ?C)Pulse:  [71] 71Resp:  [18] 18BP: (154)/(95) 154/95SpO2:  [97 %] 97 %Wt: 03/10/20 132 kg 02/11/20 131.8 kg 02/04/20 131.5 kg 01/18/20 132.9 kg 08/14/11 125.6 kg 03/30/11 123.4 kg Physical ExamConstitutional:     General: He is not in acute distress.   Appearance: Normal appearance. He is obese. He is not ill-appearing. Eyes:    General: No scleral icterus.   Extraocular Movements: Extraocular movements intact. Cardiovascular:    Rate and Rhythm: Normal rate and regular rhythm.    Heart sounds: No murmur. Pulmonary:    Effort: Pulmonary effort is normal. No respiratory distress.    Breath sounds: Normal breath sounds. No stridor. No wheezing or rhonchi. Abdominal:    General: There is no distension.    Palpations: There is no mass.    Tenderness: There is no abdominal tenderness. Musculoskeletal:       General: No swelling. Neurological:    General: No focal deficit present.    Mental Status: He is alert.    Gait: Gait abnormal.    Comments: Mild antalgic gait when transferring from chair to table; ambulates with cane Laboratory Data: Results for orders placed or performed during the hospital encounter of 03/10/20 CBC auto differential Result Value Ref Range  WBC 7.6 4.0 - 10.0 x1000/?L  RBC 3.8 3.8 - 5.9 M/?L  Hemoglobin 11.8 (L) 12.0 - 18.0 g/dL  Hematocrit 65.7 (L) 84.6 - 52.0 %  MCV 91.1 78.0 - 94.0 fL  MCHC 34.1 31.0 - 36.0 g/dL  RDW-CV 96.2 (H) 95.2 - 14.5 %  Platelets 163 140 - 440 x1000/?L  MPV 10.4 6.0 - 11.0 fL  ANC (Abs Neutrophil Count) 5.0 1.0 - 11.0 x 1000/?L  Neutrophils 65.9 37.0 - 84.0 %  Lymphocytes 22.9 8.0 - 49.0 % Absolute Lymphocyte Count 1.7 1.0 - 4.0 x 1000/?L  Monocytes 9.0 4.0 - 15.0 %  Monocyte Absolute Count 0.7 0.0 - 2.0 x 1000/?L  Eosinophils 1.2 0.0 - 7.0 %  Eosinophil Absolute Count 0.1 0.0 - 1.0 x 1000/?L  Basophil 0.7 0.0 - 4.0 %  Basophil Absolute Count 0.1 (H) 0.0 - 0.0 x 1000/?L  Immature Granulocytes 0.3 0.0 - 3.0 %  Absolute Immature Granulocyte Count 0.0 0.0 - 0.3 x 1000/?L  nRBC 0.0 0.0 - 1.0 %  Absolute nRBC 0.0 0.0 - 0.0 x 1000/?L  MCH 31.1 (H) 27.0 - 31.0 pg Comprehensive metabolic panel Result Value Ref Range  Sodium 135 (L) 136 - 144 mmol/L  Potassium 4.1 3.3 - 5.1 mmol/L  Chloride 104 98 - 107 mmol/L  CO2 23 20 - 30 mmol/L  Anion Gap 8 7 - 17  Glucose 103 (H) 70 - 100 mg/dL  BUN 18 8 - 23 mg/dL  Creatinine 8.41 (H) 3.24 - 1.30 mg/dL  Calcium 8.5 (L) 8.8 - 10.2 mg/dL  BUN/Creatinine Ratio 40.1 8.0 - 23.0  Total Protein 7.0 6.6 - 8.7 g/dL  Albumin 3.8 3.6 - 4.9 g/dL  Total Bilirubin 0.2 <=0.2 mg/dL  Alkaline Phosphatase 85 9 - 122 U/L  Alanine Aminotransferase (ALT) 19 9 - 59 U/L  Aspartate Aminotransferase (AST) 13 10 - 35 U/L  Globulin 3.2 2.3 - 3.5 g/dL  A/G Ratio 1.2 1.0 - 2.2  AST/ALT Ratio 0.7 See Comment  eGFR (Afr Amer) >  60 >60 mL/min/1.4m2  eGFR (NON African-American) 51 >60 mL/min/1.38m2  Diagnostic Imaging: None for this visit Impression: Mr. Chabot is a 69 y.o. man with 75py smoking history, history of CAD s/p MIx2 and PCI, diagnosed with stage IV NSCLC in the fall of 2020. ?He was started on treatment with Carbo/Taxol/Pembrolizumab on 12/25/19 in [place] where he received 2 cycles.  He has tolerated treatment relatively well overall, but has developed some neuropathy following the second cycle. Taxol was dose reduced by 20% starting with cycle 3 due to neuropathy.He has overall tolerated chemotherapy well. He is scheduled to see Pulmonary to evaluate his ongoing exertional dyspnea, which has remained stable. His prior cardiac work-up was unrevealing. The etiology of his self-limited, full body bone pains are unclear as they are not typical of his systemic therapy regimen. His hand neuropathy was improved with the taxol dose reduction and remains tolerable. He does also describe a separate hand pain, which may be treatment related (?autoimmune arthritis). If progressive or persistent, we discussed a trial of low-dose prednisone.We will plan to proceed with cycle 4 carbo/taxol/pembro today and will repeat imaging prior to his next visit. Plan: - SCC: Proceed today with cycle 4 (taxol with 20% dose reduction). 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 Currently grade 1. Can trial low-dose prednisone if persistent wor worsening, though patient deferred at this time. - Bone pains, self-limited: Unclear etiology and atypical for carbo/taxol/pembro. If recurs with this cycle, patient knows to call for further evaluation. - Tobacco cessation: Seen by Smoking Cessation. Has not yet started NRT due to high copays but has made progress with behavioral modifications. Will ask Smoking Cessation team to follow-up on progress of financial support application to cover NRT. - Dyspnea: CTA and echo unrevealing. PFTs and Pulm consult scheduled.  - Spinal stenosis: Referred to ortho, currently scheduled for 9/8. Will see if the appointment can be moved up, as he was previously receiving q56mo epidural CSI and was due at the end of June. - h/o CAD and CHF: referred to cardiology at last visit, scheduled for 8/13.  He knows to call the office with any new or worsening symptoms.  Seen and discussed with attending, Dr. Zykiria Bruening.___________________Benjamin Jeannie Fend. Lu, MDClinical Fellow, PGY-6Division of Medical OncologySmilow Cancer HospitalAttending Statement I have seen/evaluated the patient and agree with the H&P as documented by the Fellow Dr. Reynolds Bowl.  Mr. Guadamuz is feeling well today although has mild ongoing pain in his hands, worse by the end of the day, and different from the neuropathy that he developed after the first 2 cycles of chemo (which has improved).  May be related to [facility], can consider low-dose pred if it worsens.  He also describes severe diffuse pain for a few days after the last treatment with self-resolved without intervention. Etiology of this is unclear but may be from taxol.  If it recurs with this cycle could consider opioids as needed.  We will proceed with cycle 4 treatment and he will return in 3 weeks for pembro alone with repeat scans prior.Nikki Dom

## 2020-03-15 ENCOUNTER — Telehealth: Admit: 2020-03-15 | Payer: PRIVATE HEALTH INSURANCE | Attending: Pain Medicine

## 2020-03-15 NOTE — Telephone Encounter
Patient is scheduled for an appointment with Dr. Margaretann Loveless for back pain. No recent imaging found in chart. Previous imaging was completed at [facility] and will be requested by this RN. Imaging to be sent to [facility] via powershare. Reports to be faxed to office.

## 2020-03-16 ENCOUNTER — Telehealth: Admit: 2020-03-16 | Payer: PRIVATE HEALTH INSURANCE | Attending: Hematology

## 2020-03-16 NOTE — Telephone Encounter
RTC to pt who is c/o of increased neuropathies.  Pt is currently receiving taxol/carbo.  Pt is on gabapentin for spinal stenosis.  Pt also c/o dizziness per pt he feels like he is drinking enough and stating, I have to be very careful.  I offered pt a visit tomorrow and he agreed.  Pt will have B/W with IV start and see APRN, I have also requested a hydration chair.

## 2020-03-16 NOTE — Telephone Encounter
Recvd call from [place]. Says that his neuropathy has now traveled down to him feet primly the balls of his feet and a little in the toes. Says he is also getting very dizzy when he goes from sitting to standing. He is not sure if its from the Claritin or is med related.Marland Kitchen Has not taking any of the pain meds prescribed. Also questioning meds ordered .Would like a call back.Dvid can be reached [phone]

## 2020-03-17 ENCOUNTER — Inpatient Hospital Stay: Admit: 2020-03-17 | Discharge: 2020-03-17 | Payer: PRIVATE HEALTH INSURANCE

## 2020-03-17 ENCOUNTER — Encounter: Admit: 2020-03-17 | Payer: PRIVATE HEALTH INSURANCE | Attending: Acute Care

## 2020-03-17 ENCOUNTER — Ambulatory Visit: Admit: 2020-03-17 | Payer: PRIVATE HEALTH INSURANCE | Attending: Acute Care

## 2020-03-17 ENCOUNTER — Telehealth: Admit: 2020-03-17 | Payer: PRIVATE HEALTH INSURANCE | Attending: Oncology

## 2020-03-17 DIAGNOSIS — Z7982 Long term (current) use of aspirin: Secondary | ICD-10-CM

## 2020-03-17 DIAGNOSIS — I2510 Atherosclerotic heart disease of native coronary artery without angina pectoris: Secondary | ICD-10-CM

## 2020-03-17 DIAGNOSIS — R630 Anorexia: Secondary | ICD-10-CM

## 2020-03-17 DIAGNOSIS — R944 Abnormal results of kidney function studies: Secondary | ICD-10-CM

## 2020-03-17 DIAGNOSIS — E871 Hypo-osmolality and hyponatremia: Secondary | ICD-10-CM

## 2020-03-17 DIAGNOSIS — R42 Dizziness and giddiness: Secondary | ICD-10-CM

## 2020-03-17 DIAGNOSIS — E559 Vitamin D deficiency, unspecified: Secondary | ICD-10-CM

## 2020-03-17 DIAGNOSIS — R197 Diarrhea, unspecified: Secondary | ICD-10-CM

## 2020-03-17 DIAGNOSIS — I509 Heart failure, unspecified: Secondary | ICD-10-CM

## 2020-03-17 DIAGNOSIS — K219 Gastro-esophageal reflux disease without esophagitis: Secondary | ICD-10-CM

## 2020-03-17 DIAGNOSIS — N400 Enlarged prostate without lower urinary tract symptoms: Secondary | ICD-10-CM

## 2020-03-17 DIAGNOSIS — C3490 Malignant neoplasm of unspecified part of unspecified bronchus or lung: Secondary | ICD-10-CM

## 2020-03-17 DIAGNOSIS — E119 Type 2 diabetes mellitus without complications: Secondary | ICD-10-CM

## 2020-03-17 DIAGNOSIS — E785 Hyperlipidemia, unspecified: Secondary | ICD-10-CM

## 2020-03-17 DIAGNOSIS — Z79899 Other long term (current) drug therapy: Secondary | ICD-10-CM

## 2020-03-17 DIAGNOSIS — E291 Testicular hypofunction: Secondary | ICD-10-CM

## 2020-03-17 DIAGNOSIS — M4800 Spinal stenosis, site unspecified: Secondary | ICD-10-CM

## 2020-03-17 DIAGNOSIS — I10 Essential (primary) hypertension: Secondary | ICD-10-CM

## 2020-03-17 DIAGNOSIS — G629 Polyneuropathy, unspecified: Secondary | ICD-10-CM

## 2020-03-17 DIAGNOSIS — R0600 Dyspnea, unspecified: Secondary | ICD-10-CM

## 2020-03-17 DIAGNOSIS — F419 Anxiety disorder, unspecified: Secondary | ICD-10-CM

## 2020-03-17 DIAGNOSIS — Z720 Tobacco use: Secondary | ICD-10-CM

## 2020-03-17 LAB — CBC WITH AUTO DIFFERENTIAL
BKR WAM ABSOLUTE IMMATURE GRANULOCYTES: 0 x 1000/ÂµL (ref 0.0–0.3)
BKR WAM ABSOLUTE LYMPHOCYTE COUNT: 2.3 x 1000/ÂµL (ref 1.0–4.0)
BKR WAM ANALYZER ANC: 2.9 x 1000/??L — ABNORMAL HIGH (ref 1.0–11.0)
BKR WAM BASOPHIL ABSOLUTE COUNT: 0 x 1000/ÂµL (ref 0.0–0.0)
BKR WAM BASOPHILS: 0.5 % (ref 0.0–4.0)
BKR WAM EOSINOPHIL ABSOLUTE COUNT: 0.2 x 1000/??L (ref 0.0–1.0)
BKR WAM EOSINOPHILS: 2.8 % (ref 0.0–7.0)
BKR WAM HEMATOCRIT: 36.3 % — ABNORMAL LOW (ref 37.0–52.0)
BKR WAM HEMOGLOBIN: 11.9 g/dL — ABNORMAL LOW (ref 12.0–18.0)
BKR WAM IMMATURE GRANULOCYTES: 0.4 % (ref 0.0–3.0)
BKR WAM LYMPHOCYTES: 41.6 % (ref 8.0–49.0)
BKR WAM MCH (PG): 30.4 pg (ref 27.0–31.0)
BKR WAM MCHC: 32.8 g/dL (ref 31.0–36.0)
BKR WAM MCV: 92.6 fL (ref 78.0–94.0)
BKR WAM MONOCYTE ABSOLUTE COUNT: 0.2 x 1000/ÂµL (ref 0.0–2.0)
BKR WAM MONOCYTES: 3.6 % — ABNORMAL LOW (ref 4.0–15.0)
BKR WAM MPV: 9.5 fL (ref 6.0–11.0)
BKR WAM NEUTROPHILS: 51.1 % — ABNORMAL LOW (ref 37.0–84.0)
BKR WAM NUCLEATED RED BLOOD CELLS: 0 % (ref 0.0–1.0)
BKR WAM PLATELETS: 210 x1000/??L (ref 140–440)
BKR WAM RDW-CV: 16.5 % — ABNORMAL HIGH (ref 11.5–14.5)
BKR WAM RED BLOOD CELL COUNT: 3.9 M/??L (ref 3.8–5.9)
BKR WAM WHITE BLOOD CELL COUNT: 5.6 x1000/??L (ref 4.0–10.0)

## 2020-03-17 LAB — COMPREHENSIVE METABOLIC PANEL
BKR A/G RATIO: 1.3 (ref 1.0–2.2)
BKR ALANINE AMINOTRANSFERASE (ALT): 41 U/L (ref 9–59)
BKR ALBUMIN: 4 g/dL — ABNORMAL LOW (ref 3.6–4.9)
BKR ALKALINE PHOSPHATASE: 78 U/L (ref 9–122)
BKR ANION GAP: 9 (ref 7–17)
BKR ASPARTATE AMINOTRANSFERASE (AST): 26 U/L (ref 10–35)
BKR AST/ALT RATIO: 0.6
BKR BILIRUBIN TOTAL: 0.3 mg/dL (ref <=1.2)
BKR BLOOD UREA NITROGEN: 20 mg/dL (ref 8–23)
BKR BUN / CREAT RATIO: 14.8 (ref 8.0–23.0)
BKR CALCIUM: 8.5 mg/dL — ABNORMAL LOW (ref 8.8–10.2)
BKR CHLORIDE: 104 mmol/L (ref 98–107)
BKR CO2: 20 mmol/L (ref 20–30)
BKR CREATININE: 1.35 mg/dL — ABNORMAL HIGH (ref 0.40–1.30)
BKR EGFR (AFR AMER): >60 mL/min/{1.73_m2} (ref >60)
BKR EGFR (NON AFRICAN AMERICAN): 53 mL/min/{1.73_m2} (ref >60)
BKR GLOBULIN: 3.1 g/dL (ref 2.3–3.5)
BKR GLUCOSE: 87 mg/dL (ref 70–100)
BKR POTASSIUM: 4.5 mmol/L (ref 3.3–5.1)
BKR PROTEIN TOTAL: 7.1 g/dL (ref 6.6–8.7)
BKR SODIUM: 133 mmol/L — ABNORMAL LOW (ref 136–144)
BKR WAM ABSOLUTE NRBC: 53 mL/min/{1.73_m2} (ref >60)

## 2020-03-17 LAB — TSH W/REFLEX TO FT4     (BH GH LMW Q YH): BKR THYROID STIMULATING HORMONE: 2.21 ??IU/mL

## 2020-03-17 MED ORDER — LOPERAMIDE 2 MG CAPSULE
2 mg | Freq: Once | ORAL | Status: CP
Start: 2020-03-17 — End: ?
  Administered 2020-03-17: 21:00:00 2 mg via ORAL

## 2020-03-17 MED ORDER — SODIUM CHLORIDE 0.9 % BOLUS (NEW BAG)
0.9 % | Freq: Once | INTRAVENOUS | Status: CP
Start: 2020-03-17 — End: ?
  Administered 2020-03-17: 19:00:00 0.9 mL/h via INTRAVENOUS

## 2020-03-17 NOTE — Progress Notes
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 gentleman with 75py smoking history, history of CAD s/p MIx2 and PCI, who has now transfered care from [place] to [place] for his stage IV NSCLC.  ?He noticed worsening DOE around November and December 2020.  Chest Xray done in 09/2019 showed bilateral pulmonary nodules and chest [place] done shortly after showed numerous bilateral nodules with dominant left lung nodule measuring 1.4cm, as well as bilateral axillary, hilar and mediastinal nodes.  PET [place] 11/04/19 showed hyperavid lung nodules as well as diffuse adenopathy in neck, chest and upper abdomen.  ?He underwent ultrasound guided biopsy of infraclavicular lymph node on 11/18/19, which showed focal atypical cells concerning for carcinoma.   He subsequently underwent EBUS with biopsy of station 7 node, which was positive for metastatic squamous cell carcinoma.  There was insufficient material for molecular testing or PD-L1 staining.  - 11/28/19: Carbo/Taxol/Pembro - received 2 cycles in [place] prior to transitioning care.Current Treatment:  Carbo/Taxol/Pembro q21 days Interim History: Paul Hunter presents today for PRIORITY VISIT. He received cycle 4 carbo/taxol/pembro on 7/15.He endorses worsening dyspnea, mostly with exertion, over the last week.  No cough or fevers.  Has apt with pulm next week.  No leg swelling.  Also with poor PO intake since Monday.  Has not eaten since 6pm last night.  Minimal fluids.  Endorses dizziness with position changes; no LOC or falls.  Minimal nausea.  Reports diarrhea starting last night, at least 5 episodes of watery diarrhea.  Took two imodium this morning with benefit.  No blood or mucus.  Thinks it was something he eat.  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  Previously only had in hands.    Again experienced severe body pain following infusion.  Took Claritin with good result.  Did not require Tramadol.Review of Systems: Pertinent items are noted in HPI.  A 12-point review of systems was reviewed and is otherwise negative except as stated in the HPI.Allergies: Patient has no known allergies.Medications: Current Outpatient Medications Medication Sig ? albuterol sulfate 90 mcg/actuation HFA aerosol inhaler  ? ANORO ELLIPTA 62.5-25 mcg/actuation blister powder for inhalation  ? aspirin 81 MG EC tablet Take 81 mg by mouth daily. ? atorvastatin (LIPITOR) 80 mg tablet TAKE 1 TABLET BY MOUTH ONCE DAILY FOR 90 DAYS ? escitalopram oxalate (LEXAPRO) 10 mg tablet TAKE 1 TABLET BY MOUTH ONCE DAILY FOR 90 DAYS ? gabapentin (NEURONTIN) 300 mg capsule TAKE 1 CAPSULE BY MOUTH ONCE DAILY AT BEDTIME ? multivitamin capsule Take 1 capsule by mouth daily. ? prochlorperazine (COMPAZINE) 10 mg tablet Take 1 tablet (10 mg total) by mouth every 6 (six) hours as needed for nausea. (Patient not taking: Reported on 03/10/2020) ? traMADoL (ULTRAM) 50 mg tablet Take 1 tablet (50 mg total) by mouth every 6 (six) hours as needed. ? tumeric-ging-olive-oreg-capryl 100 mg-150 mg- 50 mg-150 mg Cap Take by mouth. No current facility-administered medications for this visit.  Exam: ECOG Performance Status: 1Vitals:Pulse:  [77] 77Resp:  [20] 20BP: (111)/(63) 111/63SpO2:  [54 %] 98 % SpO2 with ambulation: 98%Wt: 03/10/20 132 kg 02/11/20 131.8 kg 02/04/20 131.5 kg 01/18/20 132.9 kg 08/14/11 125.6 kg 03/30/11 123.4 kg  General:  Fatigued appearing, intermittently tearful through visit HEENT: No cervical or supraclavicular adenopathyChest: CTABCor:  RRR, no MRGAbd: Soft, nontender, mildly distended, +BSExt: Warm, no edema or calf tendernessSkin: No rashesNeuro: AOx3, grossly nonfocalLaboratory Data: Results for orders placed or performed during the hospital encounter of 03/17/20 CBC auto differential Result Value Ref Range  WBC 5.6 4.0 - 10.0 x1000/?L  RBC 3.9 3.8 - 5.9 M/?L  Hemoglobin 11.9 (L) 12.0 - 18.0 g/dL  Hematocrit 16.1 (L) 09.6 - 52.0 %  MCV 92.6 78.0 - 94.0 fL  MCHC 32.8 31.0 - 36.0 g/dL  RDW-CV 04.5 (H) 40.9 - 14.5 %  Platelets 210 140 - 440 x1000/?L  MPV 9.5 6.0 - 11.0 fL  ANC (Abs Neutrophil Count) 2.9 1.0 - 11.0 x 1000/?L  Neutrophils 51.1 37.0 - 84.0 %  Lymphocytes 41.6 8.0 - 49.0 %  Absolute Lymphocyte Count 2.3 1.0 - 4.0 x 1000/?L  Monocytes 3.6 (L) 4.0 - 15.0 %  Monocyte Absolute Count 0.2 0.0 - 2.0 x 1000/?L  Eosinophils 2.8 0.0 - 7.0 %  Eosinophil Absolute Count 0.2 0.0 - 1.0 x 1000/?L  Basophil 0.5 0.0 - 4.0 %  Basophil Absolute Count 0.0 0.0 - 0.0 x 1000/?L  Immature Granulocytes 0.4 0.0 - 3.0 %  Absolute Immature Granulocyte Count 0.0 0.0 - 0.3 x 1000/?L  nRBC 0.0 0.0 - 1.0 %  Absolute nRBC 0.0 0.0 - 0.0 x 1000/?L  MCH 30.4 27.0 - 31.0 pg Comprehensive metabolic panel Result Value Ref Range  Sodium 133 (L) 136 - 144 mmol/L  Potassium 4.5 3.3 - 5.1 mmol/L  Chloride 104 98 - 107 mmol/L  CO2 20 20 - 30 mmol/L  Anion Gap 9 7 - 17  Glucose 87 70 - 100 mg/dL  BUN 20 8 - 23 mg/dL  Creatinine 8.11 (H) 9.14 - 1.30 mg/dL  Calcium 8.5 (L) 8.8 - 10.2 mg/dL  BUN/Creatinine Ratio 78.2 8.0 - 23.0  Total Protein 7.1 6.6 - 8.7 g/dL  Albumin 4.0 3.6 - 4.9 g/dL  Total Bilirubin 0.3 <=9.5 mg/dL  Alkaline Phosphatase 78 9 - 122 U/L  Alanine Aminotransferase (ALT) 41 9 - 59 U/L  Aspartate Aminotransferase (AST) 26 10 - 35 U/L  Globulin 3.1 2.3 - 3.5 g/dL  A/G Ratio 1.3 1.0 - 2.2  AST/ALT Ratio 0.6 See Comment  eGFR (Afr Amer) >60 >60 mL/min/1.28m2  eGFR (NON African-American) 53 >60 mL/min/1.18m2  Diagnostic Imaging: None for this visit Impression: Mr. Chiquito is a 69 y.o. man with 75py smoking history, history of CAD s/p MIx2 and PCI, diagnosed with stage IV NSCLC in the fall of 2020. ?He was started on treatment with Carbo/Taxol/Pembrolizumab on 12/25/19 in [place] where he received?2 cycles. ?He tolerated treatment?relatively well overall, but has developed some neuropathy following?the?second cycle. Taxol was dose reduced by 20% starting with cycle 3 due to neuropathy.  He completed cycle 4 on 7/15 with plan for restaging then proceeding with maintenance pembro monotherapy.  Today he presents feeling poorly, with progressive DOE, anorexia, dizziness and worsening neuropathies.    Plan: - SCC: now s/p cycle 4, plan for restaging then maintenance pembro.- Dyspnea: Urgent CTA ordered to rule out PE vs. pneumonitis.  PFTs and Pulm consult scheduled.     - Hyponatremia: likely in setting of poor po intake.  Will give 1L IVF today.  Encouraged increased PO intake.   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 Is on gabapentin for spinal stenosis, and we can uptitrate if needed or consider Cymbalta.  Should stabilize now that he is done with taxol.  Will continue to  monitor.      - Elevated Cr: 1.35, appears around his baseline.  - Spinal stenosis: ortho scheduled for 9/8.  - h/o CAD and CHF: cardiology scheduled for 8/13.  He knows to call the office with any new or worsening symptoms.  Deidre Ala, APRN

## 2020-03-17 NOTE — Progress Notes
Quinlin presented to clinic today for hydration. Pt had labs drawn and visit with Gabriel Rung APRN after calling clinic yesterday with complaints of dizziness, shortness of breath, worsening neuropathy, diarrhea and fatigue after chemo on 7/15. 1L NS hydration given over 2 hours through POC w/o issue. Pt also given imodium x1 for diarrhea. Pt scheduled for [place] tomorrow in [place], pt aware and has transportation scheduled. POC flushed, hep locked and deaccessed, dry drsg applied. Pt left clinic in stable condition by wheelchair, pt's sister to drive him home.

## 2020-03-18 ENCOUNTER — Telehealth: Admit: 2020-03-18 | Payer: PRIVATE HEALTH INSURANCE

## 2020-03-18 ENCOUNTER — Ambulatory Visit: Admit: 2020-03-18 | Payer: PRIVATE HEALTH INSURANCE | Attending: Pain Medicine

## 2020-03-18 ENCOUNTER — Inpatient Hospital Stay: Admit: 2020-03-18 | Discharge: 2020-03-18 | Payer: PRIVATE HEALTH INSURANCE

## 2020-03-18 DIAGNOSIS — C7800 Secondary malignant neoplasm of unspecified lung: Secondary | ICD-10-CM

## 2020-03-18 DIAGNOSIS — C3490 Malignant neoplasm of unspecified part of unspecified bronchus or lung: Secondary | ICD-10-CM

## 2020-03-18 DIAGNOSIS — R0600 Dyspnea, unspecified: Secondary | ICD-10-CM

## 2020-03-18 MED ORDER — SODIUM CHLORIDE 0.9 % BOLUS (NEW BAG)
0.9 % | Freq: Once | INTRAVENOUS | Status: CP | PRN
Start: 2020-03-18 — End: ?
  Administered 2020-03-18: 15:00:00 0.9 mL/h via INTRAVENOUS

## 2020-03-18 MED ORDER — IOHEXOL 350 MG IODINE/ML INTRAVENOUS SOLUTION
350 mg iodine/mL | Freq: Once | INTRAVENOUS | Status: CP | PRN
Start: 2020-03-18 — End: ?
  Administered 2020-03-18: 15:00:00 350 mL via INTRAVENOUS

## 2020-03-18 NOTE — Telephone Encounter
Left message to confirm NEW patient appointment with Dr. Erlene Quan in the COMP PULM clinic 7/27. Instructed to call back and ask for Paul Hunter if any questions.

## 2020-03-19 ENCOUNTER — Inpatient Hospital Stay: Admit: 2020-03-19 | Discharge: 2020-03-19 | Payer: PRIVATE HEALTH INSURANCE

## 2020-03-19 DIAGNOSIS — Z01812 Encounter for preprocedural laboratory examination: Secondary | ICD-10-CM

## 2020-03-19 DIAGNOSIS — Z01818 Encounter for other preprocedural examination: Secondary | ICD-10-CM

## 2020-03-19 DIAGNOSIS — Z20822 Contact with and (suspected) exposure to covid-19: Secondary | ICD-10-CM

## 2020-03-19 LAB — COVID-19 CLEARANCE OR FOR PLACEMENT ONLY: BKR SARS-COV-2 RNA (COVID-19) (YH): NOT DETECTED x 1000/??L (ref 0.0–0.3)

## 2020-03-21 ENCOUNTER — Telehealth: Admit: 2020-03-21 | Payer: PRIVATE HEALTH INSURANCE | Attending: Hematology

## 2020-03-21 NOTE — Telephone Encounter
TC to patient.  Follow up to see how he is feeling.  He states that the aches and pains in his body are better, but he still feels SOB which causes exhaustion.  He has an appointment with pulmonary tomorrow.  Counseled to call with any issues, concerns or questions that he may have.

## 2020-03-22 ENCOUNTER — Ambulatory Visit: Admit: 2020-03-22 | Payer: PRIVATE HEALTH INSURANCE | Attending: Critical Care Medicine

## 2020-03-22 ENCOUNTER — Inpatient Hospital Stay: Admit: 2020-03-22 | Discharge: 2020-03-22 | Payer: PRIVATE HEALTH INSURANCE

## 2020-03-22 DIAGNOSIS — J449 Chronic obstructive pulmonary disease, unspecified: Secondary | ICD-10-CM

## 2020-03-23 ENCOUNTER — Encounter: Admit: 2020-03-23 | Payer: PRIVATE HEALTH INSURANCE | Attending: Hematology

## 2020-03-23 ENCOUNTER — Encounter: Admit: 2020-03-23 | Payer: PRIVATE HEALTH INSURANCE | Attending: Acute Care

## 2020-03-23 ENCOUNTER — Ambulatory Visit: Admit: 2020-03-23 | Payer: PRIVATE HEALTH INSURANCE

## 2020-03-23 DIAGNOSIS — C3490 Malignant neoplasm of unspecified part of unspecified bronchus or lung: Secondary | ICD-10-CM

## 2020-03-25 ENCOUNTER — Ambulatory Visit: Admit: 2020-03-25 | Payer: PRIVATE HEALTH INSURANCE | Attending: Pulmonary Disease

## 2020-03-25 ENCOUNTER — Encounter: Admit: 2020-03-25 | Payer: PRIVATE HEALTH INSURANCE | Attending: Internal Medicine

## 2020-03-25 ENCOUNTER — Ambulatory Visit: Admit: 2020-03-25 | Payer: PRIVATE HEALTH INSURANCE

## 2020-03-25 DIAGNOSIS — E291 Testicular hypofunction: Secondary | ICD-10-CM

## 2020-03-25 DIAGNOSIS — I2510 Atherosclerotic heart disease of native coronary artery without angina pectoris: Secondary | ICD-10-CM

## 2020-03-25 DIAGNOSIS — R0609 Other forms of dyspnea: Secondary | ICD-10-CM

## 2020-03-25 DIAGNOSIS — E119 Type 2 diabetes mellitus without complications: Secondary | ICD-10-CM

## 2020-03-25 DIAGNOSIS — I10 Essential (primary) hypertension: Secondary | ICD-10-CM

## 2020-03-25 DIAGNOSIS — F419 Anxiety disorder, unspecified: Secondary | ICD-10-CM

## 2020-03-25 DIAGNOSIS — E559 Vitamin D deficiency, unspecified: Secondary | ICD-10-CM

## 2020-03-25 DIAGNOSIS — Z720 Tobacco use: Secondary | ICD-10-CM

## 2020-03-25 DIAGNOSIS — M4800 Spinal stenosis, site unspecified: Secondary | ICD-10-CM

## 2020-03-25 DIAGNOSIS — C3490 Malignant neoplasm of unspecified part of unspecified bronchus or lung: Secondary | ICD-10-CM

## 2020-03-25 DIAGNOSIS — N400 Enlarged prostate without lower urinary tract symptoms: Secondary | ICD-10-CM

## 2020-03-25 DIAGNOSIS — E785 Hyperlipidemia, unspecified: Secondary | ICD-10-CM

## 2020-03-25 DIAGNOSIS — K219 Gastro-esophageal reflux disease without esophagitis: Secondary | ICD-10-CM

## 2020-03-25 DIAGNOSIS — J439 Emphysema, unspecified: Secondary | ICD-10-CM

## 2020-03-25 MED ORDER — ESCITALOPRAM 10 MG TABLET
10 mg | ORAL_TABLET | Freq: Every evening | ORAL | 4 refills | Status: AC
Start: 2020-03-25 — End: ?

## 2020-03-25 MED ORDER — TIOTROPIUM BROMIDE 18 MCG CAPSULE WITH INHALATION DEVICE
18 mcg | ORAL_CAPSULE | Freq: Every day | RESPIRATORY_TRACT | 3 refills | Status: SS
Start: 2020-03-25 — End: 2020-05-12

## 2020-03-26 ENCOUNTER — Inpatient Hospital Stay: Admit: 2020-03-26 | Discharge: 2020-03-26 | Payer: PRIVATE HEALTH INSURANCE

## 2020-03-26 DIAGNOSIS — C3490 Malignant neoplasm of unspecified part of unspecified bronchus or lung: Secondary | ICD-10-CM

## 2020-03-26 DIAGNOSIS — G4730 Sleep apnea, unspecified: Secondary | ICD-10-CM

## 2020-03-26 DIAGNOSIS — R0600 Dyspnea, unspecified: Secondary | ICD-10-CM

## 2020-03-26 DIAGNOSIS — J439 Emphysema, unspecified: Secondary | ICD-10-CM

## 2020-03-26 MED ORDER — SODIUM CHLORIDE 0.9 % BOLUS (NEW BAG)
0.9 % | Freq: Once | INTRAVENOUS | Status: DC
Start: 2020-03-26 — End: 2020-03-31

## 2020-03-26 MED ORDER — IOHEXOL 350 MG IODINE/ML INTRAVENOUS SOLUTION
350 mg iodine/mL | Freq: Once | INTRAVENOUS | Status: CP | PRN
Start: 2020-03-26 — End: ?
  Administered 2020-03-26: 15:00:00 350 mL via INTRAVENOUS

## 2020-03-28 NOTE — Progress Notes
Comprehensive Pulmonary Medicine ProgramPCCSM Fellows' ClinicSection of Pulmonary, Critical Care and Sleep MedicineWinchester Center for Lung [facility], [facility] [address] [place] [phone], fax [phone] Name:  Paul Garay AstorinoDate of Birth:  Jul 30, 1952EPIC MRN:  [id number] Date of Service: 7/30/2021I had the pleasure of meeting Paul Hunter in the [facility] today in consultation for shortness of breath as requested by Deidre Ala, APRN. 	History of Present Illness:This is a 69 y.o.-year-old male with a chief complaint of dyspnea and pulmonary history notable for Stage IV Non-Small Cell Lung Cancer on Carbo/Taxol/Pembro every three weeks (last dose 7/15), TUD (active smoking, ~55 pack yrs), and OSA on CPAP. Additionally he has a history of CAD/MI x 2 with PCI and depression.[name] was in his USOH until the fall on 2020 when he began to develop mild dyspnea on exertion. He was getting care in [place] and saw a pulmonologist who prescribed Anora. This did not help the patient's symptoms so he stopped taking the medication. Early this year in March, he had chest imaging for this dyspnea and it showed numerous bilateral nodules and adenopathy. He underwent EBUS and his stage 7 node was positive for SCC. PDL1 could not be tested due to insufficient material. He was connected to thoracic oncology and was started on Carbo/Taxol/Pembro. Over the last few months, his dyspnea has progressively worsened. Last week he was sent for a CTA to rule out PE and it showed no evidence of pulmonary embolism. Mild peribronchovascular groundglass is similar to the prior study, likely inflammatory, without evidence of new groundglass or consolidation, as well as an enlarged pulmonary artery. Additionally, he had a recent TTE which showed a normal EF but moderate concentric LV hypertrophy. He has set up a visit with cardiology in the near future. Of note, he is an active smoker and is connected with the smoking cessation clinic. He is contemplative about quitting and using NRT. Has tried chantix and welbutrin in the past but was unsuccessful.Denied any fevers, chills, weight loss, orthopnea, PND. Review of Systems: All other systems were reviewed and are negative.Past Medical History:Relevant PMH is as discussed in initial consult and HPI, and was otherwise reviewed in EMR.Social History: He currently smokes 1PPD with approximately 55 total pack years. There is no significant alcohol or drug use history.Family History:Family history was reviewed in EMR.Allergies/ADRs:He has No Known Allergies.Medications:He has a current medication list which includes the following prescription(s): aspirin, atorvastatin, escitalopram oxalate, gabapentin, multivitamin, prochlorperazine, tiotropium, tramadol, tumeric-ging-olive-oreg-capryl, and albuterol sulfate. Physical Examination:Blood pressure 109/62, pulse 74, temperature 98.2 ?F (36.8 ?C), temperature source Temporal, resp. rate (!) 28, height 5' 10.28 (1.785 m), weight 133.1 kg, SpO2 99 %. On room airGen: Obese male in NADHEENT: MMMCV: clear s1s2, no mrgsPulm: good air movement, some prolonged expiration, no wheezes or cracklesExt: No edemaPulmonary Function Tests: FEV1/FVC FVC FEV1 TLC RV/TLC DLCOHb FeNO 03/22/2020 75% 94% 93% 84% 86% 68% NA Personal Interpretation with attending MD: Normal spirometry. Normal TLC. Reduced ERV which may be due to body habitus. Mild decrease in diffusion capacity. Diagnostic Imaging: (interval imaging was personally reviewed with attending physician)CTA Chest 7/23/21IMPRESSION:No evidence of pulmonary embolism. Mild peribronchovascular groundglass is similar to the prior study, likely inflammatory, without evidence of new groundglass or consolidation.Stable size of pulmonary metastases, several of which have increased central cavitation since June 2021. Stable mediastinal/hilar lymphadenopathy.Laboratory Data:Notable for Cr 1.35 (BL).Otherwise interval data reviewed in EMR.Impression and Recommendations:[name] is a 69 yo male with a pulmonary history notable  for Stage IV Non-Small Cell Lung Cancer on Carboplatin/Taxol/Pembrolizumab every three weeks (last dose 03/10/20), TUD (active smoking, ~55 pack yrs), and OSA on CPAP who presents with progressive dyspnea on exertion.The differential for his dyspnea includes heart failure with preserved ejection fraction, pulmonary hypertension, and less likely checkpoint inhibitor toxicity. Of note, his dyspnea is out of proportion to his [place] findings and PFTs which show mild ground glass (although accentuated due to contrast administration) and mild diffusion impairment respectively.He has a significant cardiac history and although a recent echo showed a normal EF, he may have diastolic dysfunction. Perhaps his enlarged PA on imaging is related to pulmonary hypertension due to left heart/diastolic disease. However, there is no clear evidence of volume overload on exam. He does wear CPAP diligently but still wakes up un-refreshed which could infer that he would need titration (perhaps nocturnal desaturation). We will connect him with the Sleep clinic here. Finally, this is unlikely to be checkpoint inhibitor pneumonitis as he has minimal ground glass and these symptoms preceded his therapy. For treatment, we will trial a SABA and LAMA to see if he benefits at all, although he in non-obstructed on PFTs, he has emphysema on imaging.Recommendations:- Cardiology initial appointment coming up which will be important to exclude other contributors- Start albuterol prn for rescue and Spiriva one puff daily; if no impact on symptoms would stop after 1 month- Referral to sleep clinic, may warrant repeat PSG- Continue to follow with smoking cessation clinic through [facility] Screening:?	Tobacco counseling: patient counseled and connected in smoking cessation clinic?	Lung cancer screening: diagnosed with [facility] 2021?	Immunization administration: COVID vaccine x 2 2021, needs pneumococcalIt is a pleasure to participate in the care of this patient. Patient will follow up at the [facility] for [facility] in 2 months. Drinda Butts, MDFellow, Pulmonary, Critical Care, and Sleep MedicineElectronically Signed by Drinda Butts, MD, July 30, 2021Attending Addendum (Dr. Possick):Patient of Dr. Luanne Bras referred for dyspnea. Mr. Limehouse was seen and evaluated with Dr. Quillian Quince and I concur with history, findings, assessment and plan. 68yo with history of NSCLC on carboplatin/pemetrexed/pembrolizumab since March 2021, last given in July. He has DOE that has become more pronounced. He is still actively smoking. PFTs are normal with exception of very mildly reduced diffusion capacity. His most recent [place] does not seem overwhelming for pneumonitis but will follow up the repeat study this week and if GGO is more pronounced would advise holding IO therapy. Reassuringly his sats are normal and diffusion is minimally decreased consistent with his emphysema. Agree that a trial of COPD therapy is reasonable but would not continue this if no impact on his symptoms as he has no obstruction on PFT. He will otherwise follow up with smoking cessation clinic for ongoing support in this regard and has a cardiology consult already scheduled. WIll see him back in about 8 weeks. J.Possick, MD

## 2020-03-31 ENCOUNTER — Encounter: Admit: 2020-03-31 | Payer: PRIVATE HEALTH INSURANCE | Attending: Hematology

## 2020-03-31 ENCOUNTER — Inpatient Hospital Stay: Admit: 2020-03-31 | Discharge: 2020-03-31 | Payer: PRIVATE HEALTH INSURANCE

## 2020-03-31 ENCOUNTER — Ambulatory Visit: Admit: 2020-03-31 | Payer: PRIVATE HEALTH INSURANCE | Attending: Hematology

## 2020-03-31 DIAGNOSIS — E291 Testicular hypofunction: Secondary | ICD-10-CM

## 2020-03-31 DIAGNOSIS — R0600 Dyspnea, unspecified: Secondary | ICD-10-CM

## 2020-03-31 DIAGNOSIS — Z720 Tobacco use: Secondary | ICD-10-CM

## 2020-03-31 DIAGNOSIS — F419 Anxiety disorder, unspecified: Secondary | ICD-10-CM

## 2020-03-31 DIAGNOSIS — E559 Vitamin D deficiency, unspecified: Secondary | ICD-10-CM

## 2020-03-31 DIAGNOSIS — I10 Essential (primary) hypertension: Secondary | ICD-10-CM

## 2020-03-31 DIAGNOSIS — C3490 Malignant neoplasm of unspecified part of unspecified bronchus or lung: Secondary | ICD-10-CM

## 2020-03-31 DIAGNOSIS — I2510 Atherosclerotic heart disease of native coronary artery without angina pectoris: Secondary | ICD-10-CM

## 2020-03-31 DIAGNOSIS — E119 Type 2 diabetes mellitus without complications: Secondary | ICD-10-CM

## 2020-03-31 DIAGNOSIS — N400 Enlarged prostate without lower urinary tract symptoms: Secondary | ICD-10-CM

## 2020-03-31 DIAGNOSIS — M4800 Spinal stenosis, site unspecified: Secondary | ICD-10-CM

## 2020-03-31 DIAGNOSIS — K219 Gastro-esophageal reflux disease without esophagitis: Secondary | ICD-10-CM

## 2020-03-31 DIAGNOSIS — E785 Hyperlipidemia, unspecified: Secondary | ICD-10-CM

## 2020-03-31 LAB — COMPREHENSIVE METABOLIC PANEL
BKR A/G RATIO: 1.4 % (ref 1.0–2.2)
BKR ALANINE AMINOTRANSFERASE (ALT): 20 U/L (ref 9–59)
BKR ALBUMIN: 3.8 g/dL (ref 3.6–4.9)
BKR ALKALINE PHOSPHATASE: 84 U/L (ref 9–122)
BKR ANION GAP: 11 (ref 7–17)
BKR ASPARTATE AMINOTRANSFERASE (AST): 13 U/L (ref 10–35)
BKR AST/ALT RATIO: 0.7
BKR BILIRUBIN TOTAL: 0.2 mg/dL (ref <=1.2)
BKR BLOOD UREA NITROGEN: 17 mg/dL (ref 8–23)
BKR BUN / CREAT RATIO: 10.9 % (ref 8.0–23.0)
BKR CALCIUM: 9 mg/dL (ref 8.8–10.2)
BKR CHLORIDE: 103 mmol/L (ref 98–107)
BKR CO2: 25 mmol/L (ref 20–30)
BKR CREATININE: 1.56 mg/dL — ABNORMAL HIGH (ref 0.40–1.30)
BKR EGFR (AFR AMER): 54 mL/min/{1.73_m2} (ref >60)
BKR EGFR (NON AFRICAN AMERICAN): 45 mL/min/{1.73_m2} (ref >60)
BKR GLOBULIN: 2.8 g/dL (ref 2.3–3.5)
BKR GLUCOSE: 109 mg/dL — ABNORMAL HIGH (ref 70–100)
BKR POTASSIUM: 4.5 mmol/L — ABNORMAL LOW (ref 3.5–5.3)
BKR PROTEIN TOTAL: 6.6 g/dL (ref 6.6–8.7)
BKR SODIUM: 139 mmol/L (ref 136–144)

## 2020-03-31 LAB — CBC WITH AUTO DIFFERENTIAL
BKR WAM ABSOLUTE IMMATURE GRANULOCYTES: 0 x 1000/??L (ref 0.0–0.3)
BKR WAM ABSOLUTE LYMPHOCYTE COUNT: 2.2 x 1000/ÂµL (ref 1.0–4.0)
BKR WAM ABSOLUTE NRBC: 0 x 1000/??L (ref 0.0–0.0)
BKR WAM ANALYZER ANC: 4.2 x 1000/ÂµL (ref 1.0–11.0)
BKR WAM BASOPHIL ABSOLUTE COUNT: 0 x 1000/??L (ref 0.0–0.0)
BKR WAM BASOPHILS: 0.6 % (ref 0.0–4.0)
BKR WAM EOSINOPHIL ABSOLUTE COUNT: 0.1 x 1000/??L (ref 0.0–1.0)
BKR WAM EOSINOPHILS: 0.8 % (ref 0.0–7.0)
BKR WAM HEMATOCRIT: 35.7 % — ABNORMAL LOW (ref 37.0–52.0)
BKR WAM HEMOGLOBIN: 11.5 g/dL — ABNORMAL LOW (ref 12.0–18.0)
BKR WAM IMMATURE GRANULOCYTES: 0.4 % (ref 0.0–3.0)
BKR WAM LYMPHOCYTES: 31.2 % (ref 8.0–49.0)
BKR WAM MCH (PG): 30.7 pg (ref 27.0–31.0)
BKR WAM MCHC: 32.2 g/dL (ref 31.0–36.0)
BKR WAM MCV: 95.2 fL — ABNORMAL HIGH (ref 78.0–94.0)
BKR WAM MONOCYTE ABSOLUTE COUNT: 0.6 x 1000/ÂµL (ref 0.0–2.0)
BKR WAM MONOCYTES: 7.8 % (ref 4.0–15.0)
BKR WAM MPV: 9.6 fL (ref 6.0–11.0)
BKR WAM NEUTROPHILS: 59.2 % (ref 37.0–84.0)
BKR WAM NUCLEATED RED BLOOD CELLS: 0 % (ref 0.0–1.0)
BKR WAM PLATELETS: 198 x1000/??L — ABNORMAL HIGH (ref 140–440)
BKR WAM RDW-CV: 16.7 % — ABNORMAL HIGH (ref 11.5–14.5)
BKR WAM RED BLOOD CELL COUNT: 3.8 M/??L (ref 3.8–5.9)
BKR WAM WHITE BLOOD CELL COUNT: 7.2 x1000/??L (ref 4.0–10.0)
LIPOPROTEIN (A): 30.7 pg (ref 27.0–31.0)

## 2020-03-31 LAB — TROPONIN T     (Q): BKR TROPONIN T: <0.01 ng/mL (ref <0.01)

## 2020-03-31 LAB — SEDIMENTATION RATE (ESR): BKR SEDIMENTATION RATE, ERYTHROCYTE: 51 mm/hr — ABNORMAL HIGH (ref 0–20)

## 2020-03-31 LAB — CORTISOL: BKR CORTISOL, PLASMA: 9.5 ug/dL

## 2020-03-31 LAB — CK     (BH GH L LMW YH): BKR CREATINE KINASE TOTAL: 67 U/L (ref 11–204)

## 2020-03-31 LAB — TSH W/REFLEX TO FT4     (BH GH LMW Q YH): BKR THYROID STIMULATING HORMONE: 5.4 ??IU/mL — ABNORMAL HIGH

## 2020-03-31 LAB — T4, FREE: BKR FREE T4: 0.84 ng/dL

## 2020-03-31 LAB — NT-PROBNPE: BKR B-TYPE NATRIURETIC PEPTIDE, PRO (PROBNP): 207 pg/mL — ABNORMAL HIGH (ref <125.0)

## 2020-03-31 MED ORDER — POLYETHYLENE GLYCOL ORAL POWDER (BOWEL PREP)
17 gram/dose | ORAL | Status: SS
Start: 2020-03-31 — End: 2020-05-22

## 2020-03-31 MED ORDER — FEXOFENADINE 180 MG TABLET
180 mg | Freq: Every day | ORAL | Status: AC | PRN
Start: 2020-03-31 — End: ?

## 2020-04-01 ENCOUNTER — Telehealth: Admit: 2020-04-01 | Payer: PRIVATE HEALTH INSURANCE | Attending: Internal Medicine

## 2020-04-01 NOTE — Telephone Encounter
Spoke with Liborio Nixon. Needed clinical diagnosis code, given and tried and failed meds, Asked if he had tried Atrovent, incruse ellipta. She stated that it would be under medical review and the case # is [id number] pharmacist came on line to ask additional questions. Approved for one year.

## 2020-04-01 NOTE — Telephone Encounter
Lauren from [employer] called. She has questions about Barnetta Chapel [phone]

## 2020-04-03 ENCOUNTER — Encounter: Admit: 2020-04-03 | Payer: PRIVATE HEALTH INSURANCE | Attending: Hematology

## 2020-04-03 DIAGNOSIS — E291 Testicular hypofunction: Secondary | ICD-10-CM

## 2020-04-03 DIAGNOSIS — E559 Vitamin D deficiency, unspecified: Secondary | ICD-10-CM

## 2020-04-03 DIAGNOSIS — Z720 Tobacco use: Secondary | ICD-10-CM

## 2020-04-03 DIAGNOSIS — F419 Anxiety disorder, unspecified: Secondary | ICD-10-CM

## 2020-04-03 DIAGNOSIS — M4800 Spinal stenosis, site unspecified: Secondary | ICD-10-CM

## 2020-04-03 DIAGNOSIS — I2510 Atherosclerotic heart disease of native coronary artery without angina pectoris: Secondary | ICD-10-CM

## 2020-04-03 DIAGNOSIS — K219 Gastro-esophageal reflux disease without esophagitis: Secondary | ICD-10-CM

## 2020-04-03 DIAGNOSIS — I10 Essential (primary) hypertension: Secondary | ICD-10-CM

## 2020-04-03 DIAGNOSIS — N400 Enlarged prostate without lower urinary tract symptoms: Secondary | ICD-10-CM

## 2020-04-03 DIAGNOSIS — E119 Type 2 diabetes mellitus without complications: Secondary | ICD-10-CM

## 2020-04-03 DIAGNOSIS — E785 Hyperlipidemia, unspecified: Secondary | ICD-10-CM

## 2020-04-03 NOTE — Progress Notes
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 Binion is a 69 y.o. gentleman with 75py smoking history, history of CAD s/p MIx2 and PCI, who has now transfered care from [place] to [place] for his stage IV NSCLC.  ?He noticed worsening DOE around November and December 2020.  Chest Xray done in 09/2019 showed bilateral pulmonary nodules and chest [place] done shortly after showed numerous bilateral nodules with dominant left lung nodule measuring 1.4cm, as well as bilateral axillary, hilar and mediastinal nodes.  PET  11/04/19 showed hyperavid lung nodules as well as diffuse adenopathy in neck, chest and upper abdomen.  ?He underwent ultrasound guided biopsy of infraclavicular lymph node on 11/18/19, which showed focal atypical cells concerning for carcinoma.   He subsequently underwent EBUS with biopsy of station 7 node, which was positive for metastatic squamous cell carcinoma.  There was insufficient material for molecular testing or PD-L1 staining.  - 11/28/19: Carbo/Taxol/Pembro - received 2 cycles in [place] prior to transitioning care.Current Treatment:  Carbo/Taxol/Pembro q21 days Interim History: Kaidyn presents today for followup.  He received cycle 4 carbo/taxol/pembro on 7/15.Since his last visit, he was seen in the [facility], where his PFTs were notable only for a mildly decreased diffusion capacity. Was recommended cardiac and sleep work-up, and will in the interim undergo a trial of bronchodilators for his emphysema. States that has yet to start his LAMA (had a considerable copay but now has the medication) and was unable to pick-up the SABA (pharmacy did not have the rx). He also states he has become significantly more constipated over the last 2 weeks. Will move his bowels q3 days but requires significant straining. Taking only a fiber supplement, but is planning to start Miralax.In this setting, has had 3 episodes of post-prandial NBNB emesis/spit-up. Denies abd pain, nausea, vomiting, or diarrhea. Has not experienced previously. Continues to have some hand pains, which are worst at the end of the day. Neuropathy is improved on gabapentin. Continues to have a burning sensation in his feet.Review of Systems: Pertinent items are noted in HPI.  A 12-point review of systems was reviewed and is otherwise negative except as stated in the HPI.Allergies: Patient has no known allergies.Medications: Current Outpatient Medications Medication Sig ? aspirin 81 MG EC tablet Take 81 mg by mouth daily. ? atorvastatin (LIPITOR) 80 mg tablet TAKE 1 TABLET BY MOUTH ONCE DAILY FOR 90 DAYS ? escitalopram oxalate (LEXAPRO) 10 mg tablet Take 1 tablet (10 mg total) by mouth nightly. ? gabapentin (NEURONTIN) 300 mg capsule TAKE 1 CAPSULE BY MOUTH ONCE DAILY AT BEDTIME ? multivitamin capsule Take 1 capsule by mouth daily. ? prochlorperazine (COMPAZINE) 10 mg tablet Take 1 tablet (10 mg total) by mouth every 6 (six) hours as needed for nausea. (Patient not taking: Reported on 03/10/2020) ? tiotropium (SPIRIVA WITH HANDIHALER) 18 mcg capsule for inhalation Inhale 1 capsule (18 mcg total) into the lungs daily. ? traMADoL (ULTRAM) 50 mg tablet Take 1 tablet (50 mg total) by mouth every 6 (six) hours as needed. (Patient not taking: Reported on 03/25/2020) ? tumeric-ging-olive-oreg-capryl 100 mg-150 mg- 50 mg-150 mg Cap Take by mouth. No current facility-administered medications for this visit.  Exam: ECOG Performance Status: 1Vitals:Temp:  [98 ?F (36.7 ?C)] 98 ?F (36.7 ?C)Pulse:  [64] 64Resp:  [18] 18BP: (139)/(78) 139/78SpO2:  [99 %] 99 % SpO2 with  ambulation: 98%Wt: 03/31/20 134.3 kg 03/25/20 133.1 kg 03/22/20 130.9 kg 03/10/20 132 kg 02/11/20 131.8 kg 02/04/20 131.5 kg Physical ExamConstitutional:     General: He is not in acute distress.   Appearance: Normal appearance. He is obese. He is not toxic-appearing. HENT:    Nose: Nose normal.    Mouth/Throat:    Mouth: Mucous membranes are dry.    Pharynx: Oropharynx is clear. Eyes:    General: No scleral icterus.   Extraocular Movements: Extraocular movements intact.    Pupils: Pupils are equal, round, and reactive to light. Cardiovascular:    Rate and Rhythm: Regular rhythm.    Heart sounds: Normal heart sounds. No murmur. Pulmonary:    Effort: Pulmonary effort is normal. No respiratory distress.    Breath sounds: Normal breath sounds.    Comments: Transient expiratory wheeze in the L anterior lung fieldAbdominal:    General: There is no distension.    Palpations: Abdomen is soft. There is no mass.    Tenderness: There is no abdominal tenderness. There is no guarding. Musculoskeletal: Normal range of motion.       General: No swelling or tenderness. Neurological:    General: No focal deficit present.    Mental Status: He is alert. Laboratory Data: Results for orders placed or performed during the hospital encounter of 03/22/20 Pulmonary Function Test [facility]) Result Value Ref Range  FVC-Actual Pre-BD 4.13 L  FVC-Pre-BD % of Predicted 94 %  FVC-Predicted 4.38 L  FVC-L.L.N 3.27 L  FVC-Pre-BD Z-score -0.37 %  FEV1-Actual Pre-BD 3.09 L  FEV1-Pre-BD % of Predicted 93 %  FEV1-Predicted 3.33 L  FEV1-L.L.N 2.43 L  FEV1-Pre-BD Z-Score -0.45 %  FEV1/FVC-Actual Pre-BD 75 %  FEV1/FVC-Pre-BD % of Predicted 99 %  FEV1/FVC-Predicted 76 %  FEV1/FVC-L.L.N 63 %  FEV1/FVC-Pre-BD Z-Score -0.13 %  FEF25-75-Actual Pre-BD 2.47 L/s  FEF25-75-Pre-BD % of Predicted 96 %  FEF25-75-Predicted 2.58 L/s  FEF25-75-L.L.N 1.16 L/s  FEF25-75-Pre-BD Z-Score -0.11 %  DLCO (Hb)-Actual Pre-BD 18.04 mL/min/mmHg  DLCO (Hb)-Pre-BD % of Predicted 68 %  DLCO (Hb)-L.L.N 19.65 %  DLCO (Hb)- Predicted 26.55 mL/min/mmHg  DLCO (Hb)-Pre-BD Z-Score -2.08 %  DLCO/VA-Actual Pre-BD 3.02 mL/min/mmHg/L  DLCO/VA-Pre-BD % of Predicted 73 %  DLCO/VA-L.L.N 3.07 %  DLCO/VA- Predicted 4.11 mL/min/mmHg/L  DLCO/VA-Pre-BD Z-Score -1.73 %  FRC- Pre-BD % of Predicted 70 %  FRC-Pre-BD Z-Score -1.29 %  FRC-Actual Pre-BD 2.63 L  FRC-Predicted 3.77 L  FRC-L.L.N 2.31 L  TLC-Pre-BD 6.00 L  TLC-Pre-BD % of Predicted 84 %  TLC-Pre-BD Z-Score -1.13 %  TLC-Predicted 7.11 L  TLC-L.L.N 5.50 L  RV- Actual Pre-BD 1.79 L  RV-Pre-BD % of Predicted 74 %  RV-Pre-BD Z-Score -1.39 %  RV- Predicted 2.43 L  RV-L.L.N 1.67 L  RV/TLC-Actual Pre-BD 30 %  RV/TLC Pre-BD % of Predicted 86 %  RV/TLC-Pre-BD Z-Score -1.14 %  RV/TLC-Predicted 35 %  RV/TLC-L.L.N 28 %  VC-Actual Pre-BD 4.21 L  VC-Pre-BD % of Predicted 91 %  VC-Pre-BD Z-Score -0.63 %  VC-Predicted 4.64 L  VC-L.L.N 3.52 L  IC-Pre-BD 3.37 L  IC-Pre-BD % of Predicted 102 %  IC-Predicted 3.30 L  ERV-Pre-BD 0.84 L  ERV-Pre-BD % of Predicted 63 %  ERV-Predicted 1.34 L  Diagnostic Imaging: [place] Chest w IV contrast: 7/31/2021No significant change in tumor evidenced by unchanged bilateral pulmonary metastases, mediastinal, hilar and axillary lymphadenopathy. Some pulmonary metastases are cavitary.Vaughn Abd/Pelv: 7/31/2021Decrease in size of retroperitoneal/retrocrural lymph nodes which now all measure less than 1 cm.Impression: Mr. Hunt is  a 68 y.o. man with a 75py smoking history, history of CAD s/p MIx2 and PCI, diagnosed with stage IV NSCLC in the fall of 2020. ?He was started on treatment with Carbo/Taxol/Pembrolizumab on 12/25/19 in [place] where he received?2 cycles. ?He tolerated treatment?relatively well overall, but has developed some neuropathy following?the?second cycle. Taxol was dose reduced by 20% starting with cycle 3 due to neuropathy. He completed cycle 4 on 7/15. Today, he presents for scan review, which overall shows stable/improving disease, including improvements in his RP LAD. However, he continues to experience significant DOE of unclear etiology. Is undergoing empiric bronchodilator therapy and is scheduled to see Cardio-Onc next week. We will perform the following additional evaluation, as outlined below. We will defer starting his pembro maintenance therapy pending his work-up. Plan: - SCC: now s/p cycle 4. Restaging scans show disease response, overall SD. Will plan for pembro maintenance therapy, though will defer today while undergoing cardiac work-up. - Dyspnea: Evaluated by Pulm and undergoing empiric LAMA/SABA trial for emphysema. We made Pulm team aware of issues with his bronchodilators. Is also scheduled to see Cardio Onc. Will obtain ECG, trop, and CK today to evaluate for immune-related myocarditis. If elevated, might consider a cardiac MRI, though will defer to cardio-onc team for recommendations.- Constipation: Planning to start Miralax. Reviewed administration directions. 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 Is on gabapentin for spinal stenosis, though will recommend uptitrating to 600mg  qhs for additional benefit of hand neuropathy. Would anticipate neuropathy to improve now that he has completed taxol. Will additionally send endocrine studies (cortisol, TSH) and inflammatory markers to assess for possible autoimmune toxicities. - Elevated Cr: Mildly elevated compared to recent baseline ~1.3-1.4 due to poor appetite. - Spinal stenosis: Ortho scheduled for 9/8.  - H/o CAD and CHF: cardiology scheduled for 8/13.  He knows to call the office with any new or worsening symptoms.  Seen and discussed with attending, Dr. Xyon Lukasik.___________________Benjamin Jeannie Fend. Lu, MDClinical Fellow, PGY-6Division of Medical OncologySmilow Cancer HospitalAttending Statement I have seen/evaluated the patient and agree with the H&P as documented by the Fellow Dr. Reynolds Bowl.  Ms. Circle continues to have dyspnea on exertion, stable over the last few weeks but worse than baseline.  He also has neuropathy which has improved recently and bilateral hand pain which persists.  Scans show improvement in disease.  We recommended holding pembro today given ongoing dyspnea of unclear etiology.  We will check an EKG, troponin and BNP today, and he is going to see the cardio-oncology team next week for further evaluation.  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  Will increase dose of neurontin for neuropathy.  We will see him back in 2 weeks for consideration of starting maintenance pembro at that time.Nikki Dom

## 2020-04-08 ENCOUNTER — Inpatient Hospital Stay: Admit: 2020-04-08 | Discharge: 2020-04-08 | Payer: PRIVATE HEALTH INSURANCE

## 2020-04-08 ENCOUNTER — Encounter: Admit: 2020-04-08 | Payer: PRIVATE HEALTH INSURANCE | Attending: Cardiovascular Disease

## 2020-04-08 ENCOUNTER — Ambulatory Visit: Admit: 2020-04-08 | Payer: PRIVATE HEALTH INSURANCE | Attending: Cardiovascular Disease

## 2020-04-08 ENCOUNTER — Encounter
Admit: 2020-04-08 | Payer: PRIVATE HEALTH INSURANCE | Attending: Student in an Organized Health Care Education/Training Program

## 2020-04-08 DIAGNOSIS — M4800 Spinal stenosis, site unspecified: Secondary | ICD-10-CM

## 2020-04-08 DIAGNOSIS — E785 Hyperlipidemia, unspecified: Secondary | ICD-10-CM

## 2020-04-08 DIAGNOSIS — I10 Essential (primary) hypertension: Secondary | ICD-10-CM

## 2020-04-08 DIAGNOSIS — K219 Gastro-esophageal reflux disease without esophagitis: Secondary | ICD-10-CM

## 2020-04-08 DIAGNOSIS — I2510 Atherosclerotic heart disease of native coronary artery without angina pectoris: Secondary | ICD-10-CM

## 2020-04-08 DIAGNOSIS — E559 Vitamin D deficiency, unspecified: Secondary | ICD-10-CM

## 2020-04-08 DIAGNOSIS — E119 Type 2 diabetes mellitus without complications: Secondary | ICD-10-CM

## 2020-04-08 DIAGNOSIS — E291 Testicular hypofunction: Secondary | ICD-10-CM

## 2020-04-08 DIAGNOSIS — Z720 Tobacco use: Secondary | ICD-10-CM

## 2020-04-08 DIAGNOSIS — F419 Anxiety disorder, unspecified: Secondary | ICD-10-CM

## 2020-04-08 DIAGNOSIS — E1159 Type 2 diabetes mellitus with other circulatory complications: Secondary | ICD-10-CM

## 2020-04-08 DIAGNOSIS — N400 Enlarged prostate without lower urinary tract symptoms: Secondary | ICD-10-CM

## 2020-04-08 LAB — NT-PROBNPE: BKR B-TYPE NATRIURETIC PEPTIDE, PRO (PROBNP): 154 pg/mL — ABNORMAL HIGH (ref <125.0)

## 2020-04-08 LAB — LIPID PANEL
BKR CHOLESTEROL/HDL RATIO: 5.5 — ABNORMAL HIGH (ref 0.0–5.0)
BKR CHOLESTEROL: 115 mg/dL
BKR HDL CHOLESTEROL: 21 mg/dL — ABNORMAL LOW (ref >=40)
BKR LDL CHOLESTEROL CALCULATED: 63 mg/dL
BKR TRIGLYCERIDES: 155 mg/dL — ABNORMAL HIGH

## 2020-04-08 LAB — HEMOGLOBIN A1C
BKR ESTIMATED AVERAGE GLUCOSE: 114 mg/dL
BKR HEMOGLOBIN A1C: 5.6 % (ref 4.0–5.6)

## 2020-04-08 LAB — TROPONIN T     (Q): BKR TROPONIN T: <0.01 ng/mL — ABNORMAL HIGH (ref <0.01)

## 2020-04-08 MED ORDER — ICOSAPENT ETHYL 1 GRAM CAPSULE
1 gram | ORAL_CAPSULE | Freq: Two times a day (BID) | ORAL | 7 refills | Status: SS
Start: 2020-04-08 — End: 2020-05-12

## 2020-04-08 MED ORDER — ATORVASTATIN 80 MG TABLET
80 mg | ORAL_TABLET | Freq: Every evening | ORAL | 7 refills | Status: AC
Start: 2020-04-08 — End: ?

## 2020-04-08 NOTE — Patient Instructions
Get cMRIGet PET stressGet labs done todayContinue current medicationsDo blood pressure logFollow up in 6-8 weeks

## 2020-04-08 NOTE — Other
Trop negative, a1c ok,bnp slightly elevated, could be from phtn or mild diastolic dysfunction --> use cpap elevated TG at 154 fasting, given CAD hx, in addition to diet modifications (whole food plant based diet), consider starting vascepa 2g twice a daily given pt is at max dose atorvastatin and the MACE reductions shown for those with CVD with this agent, will see if his insurance will cover it

## 2020-04-08 NOTE — Progress Notes
[place] Cardio-Oncology Evaluation      Subjective:     Chief Complaint: Paul Hunter comes in today for a new evaluation of hx of CAD s/p PCI x4 with new diagnosis of metastatic lung cancer followed by Dr. Layla Barter. OSH EF of 39% on stress test. referral to establish care with cardiooncology    History of Present Illness:    69 y.o. M with current tobacco use (1ppd for years), emphysema, history of CAD s/p MIx2 (1996, felt arm pain/fatigue) (2014 felt anxious, no CP/DOE) and PCI x4, HFrEF (LVEF 39% in spect 09/2019) with recovery with recent TTE showing LVEF of 62% 02/08/20, transfered care from [place] to [place] for his stage IV NSCLC (chest and abdomen) started on Carbo/Taxol/Pembro 11/28/19 s/p 2 cycles, and 2 more cycles at [facility] most recent 03/10/20 referred to cardio-onc to est care given hx of CAD and MI.  ?s/p 4 cycles of chemo with planned pembro therapy continuation    Pt developed worsening SOB/DOB early last yr, can only walk about 10 steps, cannot climb 1 flight of stairs w/o stopping. No CP/orthopnea, LE edema +LE pain with exertion +burning sensation/throbbing started after chemotherapy    No carotid stent    Father had a possible brain aneurysm  No MI, HF, SCD in family      Medical History:     PMH PSH   Past Medical History:   Diagnosis Date   ? Anxiety    ? CAD (coronary artery disease)    ? Diabetes mellitus (HC Code)    ? GERD (gastroesophageal reflux disease)    ? Hyperlipidemia    ? Hypertension    ? Hypertrophy of prostate without urinary obstruction and other lower urinary tract symptoms (LUTS)    ? Other testicular hypofunction    ? Spinal stenosis 09/28/2018   ? Tobacco abuse    ? Vitamin D deficiency      +lumbar spinal stenosis Past Surgical History:   Procedure Laterality Date   ? CAROTID STENT  08/28/2003   ? CHOLECYSTECTOMY  11/07/2016   ? KNEE SURGERY      torn meniscus   ? LAMINECTOMY  10/07/2008   ? LUMBAR DISC SURGERY      herniated disc        Social History Family History   He  reports that he has been smoking cigarettes. He does not have any smokeless tobacco history on file. No history on file for alcohol and drug.     No family history on file.         Medications   Current Outpatient Medications   Medication Sig Dispense Refill   ? aspirin 81 MG EC tablet Take 81 mg by mouth daily.     ? atorvastatin (LIPITOR) 80 mg tablet TAKE 1 TABLET BY MOUTH ONCE DAILY FOR 90 DAYS     ? escitalopram oxalate (LEXAPRO) 10 mg tablet Take 1 tablet (10 mg total) by mouth nightly. 90 tablet 3   ? fexofenadine (ALLEGRA) 180 mg tablet Take 180 mg by mouth daily as needed.     ? gabapentin (NEURONTIN) 300 mg capsule 600 mg.      ? multivitamin capsule Take 1 capsule by mouth daily.     ? polyethylene glycol (GLYCOLAX; MIRALAX) 17 gram/dose powder Take by mouth.     ? prochlorperazine (COMPAZINE) 10 mg tablet Take 1 tablet (10 mg total) by mouth every 6 (six) hours as needed for nausea. (Patient not taking: Reported on  03/10/2020) 60 tablet 1   ? tiotropium (SPIRIVA WITH HANDIHALER) 18 mcg capsule for inhalation Inhale 1 capsule (18 mcg total) into the lungs daily. (Patient not taking: Reported on 03/31/2020) 30 capsule 2   ? traMADoL (ULTRAM) 50 mg tablet Take 1 tablet (50 mg total) by mouth every 6 (six) hours as needed. (Patient not taking: Reported on 03/25/2020) 15 tablet 0   ? tumeric-ging-olive-oreg-capryl 100 mg-150 mg- 50 mg-150 mg Cap Take by mouth.       No current facility-administered medications for this visit.             Allergies   No Known Allergies       Review of Systems:   ROS  as per HPI    The remainder of the 12 point review of systems was reviewed with the patient and is negative or noncontributory.    Objective:     Vital Signs:  SBP 109-150s  HR 56-80s  Vitals:    04/08/20 0856   BP: 114/72   Pulse: 66   Resp: 20   Temp: 98 ?F (36.7 ?C)   TempSrc: Oral   SpO2: 99%   Weight: 132.4 kg   Height: 5' 10.28 (1.785 m)     BMI Readings from Last 3 Encounters:   04/08/20 41.55 kg/m?   03/31/20 42.13 kg/m?   03/25/20 41.77 kg/m?     Weight   lbs.  Wt Readings from Last 3 Encounters:   03/31/20 134.3 kg   03/25/20 133.1 kg   03/22/20 130.9 kg     Last 3 weights 03/31/2020 03/25/2020 03/22/2020   Weight (lb) 296 lb 293 lb 6.9 oz 288 lb 9.3 oz   Weight (kg) 134.265 kg 133.1 kg 130.9 kg     Waist circumference:  Last 3 blood pressures and heart rates:    Physical Exam:   Physical Exam   Gen: NAD  Heent: NCAT  CV: RRR no MRG  Pulm: CTAB  Abd: soft ntnd, obese  Ext: WWP, pulses present in UE and LE bl  Neuro: nonfocal AOx3  Skin: no rashes, bruising    Data:    Na/K/Mg/bicarb/Cr: 139/4.5/25/1.56 03/31/20  AST/ALT: 13/20 03/31/20  HgbA1c: 5.7 09/2010  TSH/free T4: 5.4H/0.84 wnl 03/31/20  NTproBNP: 207 03/31/20  Lipids: LDL 56, tg 124 hdl 24  total 105 09/29/2019 OSH  Lipoprotein (a): none  Trop T <0.01 03/31/20    03/31/20  hbg 11.5  plt 198    Imaging:    TTE: 02/08/20   * Normal left ventricular size and systolic function. Moderate concentric left ventricular hypertrophy.  No regional wall motion abnormalities.  LVEF calculated by biplane Simpson's was 62%.  * Longitudinal strain did not track well. No thrombus visualized in the left ventricle  * Normal right ventricular cavity size and systolic function.  * Left atrium is mildly dilated.  No interatrial shunt by color Doppler.  Right atrium is normal in size.  * No significant valvular abnormalities.  * Dilated sinuses of Valsalva with a diameter of 4.0 cm and dilated ascending aorta with a diameter of 4.4 cm.  * IVC diameter < 2.1 cm that collapses > 50% with a sniff suggests normal RAP (0-5 mmHg, mean 3 mmHg).  * No evidence of pericardial effusion  * No prior study available for comparison.    Cardiac MRI:  None    Stress test:  None here    10/09/19        Holter/event monitor:  None here    ECG:  03/31/20 nsr, q III      [place] 02/2020  Heart and Vessels: The cardiac chambers appear normal in size. No pericardial effusion. Main pulmonary artery is enlarged measuring 4 cm, nonspecific can be seen with pulmonary hypertension. Ascending aorta is dilated measuring 4.5 cm, unchanged. Three-vessel coronary artery calcifications. There is fat deposition along the left ventricle, likely sequela of prior infarct.  ?                      Assessment and Plan:   69 y.o. M with  current tobacco use (1ppd now, 1-2ppd for 50 years, tried to quit multiple times), HTN, OSA on CPAP, history of CAD s/p MIx2 1996 (1 stent at [facility] area), 2014  (3 stents in [facility] hospital at [facility]) and PCI x4 (on review of [place] chest, likely RCA and LAD stents), HFrEF? LVEF 39% (OSH spect 09/2019), inferior wall hypokinesis with rega stress test 10/09/19 showing inferior infarct with recovery? with recent TTE 01/2020 showing LVEF of 62%, moderate LVH, no WMA on 02/08/20, transfered care from [place] to [place] for his stage IV NSCLC started on Carbo/Taxol/Pembro 11/28/19 s/p 4 cycles, most recent 03/10/20 referred to cardio-onc to est care given hx of CAD and MI.  Pt with poor functional capacity, progressive DOE over the past year, even prior to chemotherapy, slightly different sx from prior MI. Euvolemic on exam. [place] chest 02/2020 showed ascending dilated aorta of 4.5cm and dilated PA to 4cm?    Obtain records from OSH re [facility]  Continue asa 81, atorvastatin 80mg   Consider adding lisinopril 2.5 given hx of cad and possible CM    Get cMRI  Get PET stress   Obtain lipid panel, a1c, bnp, troponin  Log BP and bring for next visit  Counseled on smoking cessation  Repeat CTA in 1 yr to monitor aortic aneurysm 02/2021    Exercise prescription: as tolerated    RTC in 6 wks w/Jessica Coviello APRN  D/w Dr Romie Levee      Signed:    Milta Deiters MD PhD  Clinical Cardio-oncology fellow    Electronically Signed by Ferne Reus, MD, April 07, 2020      Pt seen and examined with Dr. Bryson Ha. I agree with the findings as above, and all data reviewed by me. I agree with the assessment and plan as above and this has been discussed with the patient. This is a 69 yo M with hx of CAD s/p 2 prior MIs with stent placement x 4 ('96 and '14, anatomy unknown), current smoker, HTN, OSA on CPAP, recent LVEF 62%, with worsening DOE for 6-12 months, stress SPECT in Feb '21 with fixed inferior infarct and no ischemia, pt dx with NSCLC stage IV April '21, now on carbo/taxol/pembro. Given that pt with hx of CAD with mult prior PCIs and not with classic hx of angina with prior MIs, and with persistent symptoms which could be concerning for anginal equivalent, plan for PET stress. Medical management as above. CMR for assessment on ICI tx.    Kara Pacer, MD

## 2020-04-14 ENCOUNTER — Encounter: Admit: 2020-04-14 | Payer: PRIVATE HEALTH INSURANCE

## 2020-04-14 ENCOUNTER — Inpatient Hospital Stay: Admit: 2020-04-14 | Discharge: 2020-04-14 | Payer: PRIVATE HEALTH INSURANCE

## 2020-04-14 ENCOUNTER — Ambulatory Visit: Admit: 2020-04-14 | Payer: PRIVATE HEALTH INSURANCE

## 2020-04-14 ENCOUNTER — Encounter: Admit: 2020-04-14 | Payer: PRIVATE HEALTH INSURANCE | Attending: Acute Care

## 2020-04-14 ENCOUNTER — Ambulatory Visit: Admit: 2020-04-14 | Payer: PRIVATE HEALTH INSURANCE | Attending: Acute Care

## 2020-04-14 DIAGNOSIS — M4800 Spinal stenosis, site unspecified: Secondary | ICD-10-CM

## 2020-04-14 DIAGNOSIS — C3490 Malignant neoplasm of unspecified part of unspecified bronchus or lung: Secondary | ICD-10-CM

## 2020-04-14 DIAGNOSIS — E785 Hyperlipidemia, unspecified: Secondary | ICD-10-CM

## 2020-04-14 DIAGNOSIS — E119 Type 2 diabetes mellitus without complications: Secondary | ICD-10-CM

## 2020-04-14 DIAGNOSIS — I2510 Atherosclerotic heart disease of native coronary artery without angina pectoris: Secondary | ICD-10-CM

## 2020-04-14 DIAGNOSIS — K219 Gastro-esophageal reflux disease without esophagitis: Secondary | ICD-10-CM

## 2020-04-14 DIAGNOSIS — E559 Vitamin D deficiency, unspecified: Secondary | ICD-10-CM

## 2020-04-14 DIAGNOSIS — Z720 Tobacco use: Secondary | ICD-10-CM

## 2020-04-14 DIAGNOSIS — I10 Essential (primary) hypertension: Secondary | ICD-10-CM

## 2020-04-14 DIAGNOSIS — E291 Testicular hypofunction: Secondary | ICD-10-CM

## 2020-04-14 DIAGNOSIS — F419 Anxiety disorder, unspecified: Secondary | ICD-10-CM

## 2020-04-14 DIAGNOSIS — N400 Enlarged prostate without lower urinary tract symptoms: Secondary | ICD-10-CM

## 2020-04-14 LAB — COMPREHENSIVE METABOLIC PANEL
BKR A/G RATIO: 1.2 x 1000/??L (ref 1.0–2.2)
BKR ALANINE AMINOTRANSFERASE (ALT): 23 U/L (ref 9–59)
BKR ALBUMIN: 4 g/dL (ref 3.6–4.9)
BKR ALKALINE PHOSPHATASE: 95 U/L (ref 9–122)
BKR ANION GAP: 9 (ref 7–17)
BKR ASPARTATE AMINOTRANSFERASE (AST): 16 U/L (ref 10–35)
BKR AST/ALT RATIO: 0.7
BKR BILIRUBIN TOTAL: 0.3 mg/dL (ref <=1.2)
BKR BLOOD UREA NITROGEN: 17 mg/dL (ref 8–23)
BKR BUN / CREAT RATIO: 11.9 % (ref 8.0–23.0)
BKR CALCIUM: 9.1 mg/dL (ref 8.8–10.2)
BKR CHLORIDE: 103 mmol/L (ref 98–107)
BKR CO2: 23 mmol/L (ref 20–30)
BKR CREATININE: 1.43 mg/dL — ABNORMAL HIGH (ref 0.40–1.30)
BKR EGFR (AFR AMER): 60 mL/min/{1.73_m2} (ref >60)
BKR EGFR (NON AFRICAN AMERICAN): 49 mL/min/{1.73_m2} (ref >60)
BKR GLOBULIN: 3.3 g/dL (ref 2.3–3.5)
BKR GLUCOSE: 119 mg/dL — ABNORMAL HIGH (ref 70–100)
BKR POTASSIUM: 4.1 mmol/L (ref 3.3–5.1)
BKR PROTEIN TOTAL: 7.3 g/dL (ref 6.6–8.7)
BKR SODIUM: 135 mmol/L — ABNORMAL LOW (ref 136–144)
BKR WAM MCHC: 9 g/dL (ref 7–17)
BKR WAM PLATELETS: 17 mg/dL (ref 8–23)

## 2020-04-14 LAB — CBC WITH AUTO DIFFERENTIAL
BKR WAM ABSOLUTE IMMATURE GRANULOCYTES: 0 x 1000/??L (ref 0.0–0.3)
BKR WAM ABSOLUTE LYMPHOCYTE COUNT: 1.8 x 1000/??L (ref 1.0–4.0)
BKR WAM ABSOLUTE NRBC: 0 x 1000/??L (ref 0.0–0.0)
BKR WAM ANALYZER ANC: 4.3 x 1000/??L (ref 1.0–11.0)
BKR WAM BASOPHIL ABSOLUTE COUNT: 0 x 1000/ÂµL (ref 0.0–0.0)
BKR WAM BASOPHILS: 0.4 % (ref 0.0–4.0)
BKR WAM EOSINOPHIL ABSOLUTE COUNT: 0.1 x 1000/??L (ref 0.0–1.0)
BKR WAM EOSINOPHILS: 1.9 % (ref 0.0–7.0)
BKR WAM HEMATOCRIT: 35.8 % — ABNORMAL LOW (ref 37.0–52.0)
BKR WAM HEMOGLOBIN: 12 g/dL (ref 12.0–18.0)
BKR WAM IMMATURE GRANULOCYTES: 0.4 % (ref 0.0–3.0)
BKR WAM LYMPHOCYTES: 26.3 % (ref 8.0–49.0)
BKR WAM MCH (PG): 31.4 pg — ABNORMAL HIGH (ref 27.0–31.0)
BKR WAM MCV: 93.7 fL (ref 78.0–94.0)
BKR WAM MONOCYTE ABSOLUTE COUNT: 0.5 x 1000/??L (ref 0.0–2.0)
BKR WAM MONOCYTES: 6.8 % (ref 4.0–15.0)
BKR WAM MPV: 9.4 fL — ABNORMAL HIGH (ref 6.0–11.0)
BKR WAM NEUTROPHILS: 64.2 % (ref 37.0–84.0)
BKR WAM NUCLEATED RED BLOOD CELLS: 0 % (ref 0.0–1.0)
BKR WAM RDW-CV: 16.2 % — ABNORMAL HIGH (ref 11.5–14.5)
BKR WAM RED BLOOD CELL COUNT: 3.8 M/??L — ABNORMAL LOW (ref 3.8–5.9)
BKR WAM WHITE BLOOD CELL COUNT: 6.7 x1000/??L (ref 4.0–10.0)

## 2020-04-14 LAB — TSH W/REFLEX TO FT4     (BH GH LMW Q YH): BKR THYROID STIMULATING HORMONE: 1.85 ??IU/mL

## 2020-04-14 MED ORDER — GABAPENTIN 300 MG CAPSULE
300 mg | ORAL_CAPSULE | Freq: Every evening | ORAL | 1 refills | Status: SS
Start: 2020-04-14 — End: 2020-05-25

## 2020-04-14 NOTE — Progress Notes
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 gentleman with 75py smoking history, history of CAD s/p MIx2 and PCI, who has now transfered care from [place] to [place] for his stage IV NSCLC.  ?He noticed worsening DOE around November and December 2020.  Chest Xray done in 09/2019 showed bilateral pulmonary nodules and chest Bell done shortly after showed numerous bilateral nodules with dominant left lung nodule measuring 1.4cm, as well as bilateral axillary, hilar and mediastinal nodes.  PET Roland 11/04/19 showed hyperavid lung nodules as well as diffuse adenopathy in neck, chest and upper abdomen.  ?He underwent ultrasound guided biopsy of infraclavicular lymph node on 11/18/19, which showed focal atypical cells concerning for carcinoma.   He subsequently underwent EBUS with biopsy of station 7 node, which was positive for metastatic squamous cell carcinoma.  There was insufficient material for molecular testing or PD-L1 staining.  - 11/28/19 - 03/10/20: Carbo/Taxol/Pembro - received 2 cycles in [place] prior to transitioning care.Current Treatment:  Transition to Pembro maintenance (on hold)Interim History: Paul Hunter presents today for followup.  He received cycle 4 carbo/taxol/pembro on 7/15.  Treatment was held at his last visit in favor of cardiac work up given his progressive dyspnea, and concern for IO-induced myocarditis.He was evaluated by cardio-oncology on 8/13 with plan for cMRI and PET stress.  His breathing is about the same, though he does feel that it might be minimally better since starting Spiriva about two weeks ago.  Still quite fatigued.  Sleep is poor.   Was referred to Sleep Clinic a few months ago but this was never scheduled.Constipation comes and goes, now taking Miralax QOD which is working fairly well.  Neuropathy unchanged.  At last visit we recommended increasing his nightly gabapentin, but he did not start this yet.   ?Denies abd pain, nausea, vomiting, or diarrhea. Review of Systems: Pertinent items are noted in HPI.  A 12-point review of systems was reviewed and is otherwise negative except as stated in the HPI.Allergies: Patient has no known allergies.Medications: Current Outpatient Medications Medication Sig ? aspirin 81 MG EC tablet Take 81 mg by mouth daily. ? atorvastatin (LIPITOR) 80 mg tablet Take 1 tablet (80 mg total) by mouth nightly. ? escitalopram oxalate (LEXAPRO) 10 mg tablet Take 1 tablet (10 mg total) by mouth nightly. ? fexofenadine (ALLEGRA) 180 mg tablet Take 180 mg by mouth daily as needed. ? gabapentin (NEURONTIN) 300 mg capsule 600 mg.  ? icosapent ethyL (VASCEPA) 1 gram capsule Take 2 capsules (2 g total) by mouth 2 (two) times daily (0800, 1800). ? multivitamin capsule Take 1 capsule by mouth daily. ? polyethylene glycol (GLYCOLAX; MIRALAX) 17 gram/dose powder Take by mouth. ? prochlorperazine (COMPAZINE) 10 mg tablet Take 1 tablet (10 mg total) by mouth every 6 (six) hours as needed for nausea. (Patient not taking: Reported on 03/10/2020) ? tiotropium (SPIRIVA WITH HANDIHALER) 18 mcg capsule for inhalation Inhale 1 capsule (18 mcg total) into the lungs daily. (Patient not taking: Reported on 03/31/2020) ? traMADoL (ULTRAM) 50 mg tablet Take 1 tablet (50 mg total) by mouth every 6 (six) hours as needed. (Patient not taking: Reported on 03/25/2020) ? tumeric-ging-olive-oreg-capryl 100 mg-150 mg- 50 mg-150 mg Cap Take by mouth. No current facility-administered medications for this visit. Exam: ECOG Performance Status: 1Vitals:Temp:  [98.1 ?F (36.7 ?C)] 98.1 ?F (36.7 ?C)Pulse:  [53]  53Resp:  [20] 20BP: (117)/(70) 117/70SpO2:  [97 %] 97 % Wt: 04/14/20 132.2 kg 04/08/20 132.4 kg 03/31/20 134.3 kg 03/25/20 133.1 kg 03/22/20 130.9 kg 03/10/20 132 kg  General:  No acute distress HEENT: No cervical or supraclavicular adenopathyChest: CTA throughout Cor: RRR, no MRGAbd: Soft, nontender, mildly distended, +BSExt: Warm, no edema or calf tendernessSkin: No rashesNeuro: AOx3, grossly nonfocalLaboratory Data: Results for orders placed or performed in visit on 04/14/20 CBC auto differential Result Value Ref Range  WBC 6.7 4.0 - 10.0 x1000/?L  RBC 3.8 3.8 - 5.9 M/?L  Hemoglobin 12.0 12.0 - 18.0 g/dL  Hematocrit 13.2 (L) 44.0 - 52.0 %  MCV 93.7 78.0 - 94.0 fL  MCHC 33.5 31.0 - 36.0 g/dL  RDW-CV 10.2 (H) 72.5 - 14.5 %  Platelets 250 140 - 440 x1000/?L  MPV 9.4 6.0 - 11.0 fL  ANC (Abs Neutrophil Count) 4.3 1.0 - 11.0 x 1000/?L  Neutrophils 64.2 37.0 - 84.0 %  Lymphocytes 26.3 8.0 - 49.0 %  Absolute Lymphocyte Count 1.8 1.0 - 4.0 x 1000/?L  Monocytes 6.8 4.0 - 15.0 %  Monocyte Absolute Count 0.5 0.0 - 2.0 x 1000/?L  Eosinophils 1.9 0.0 - 7.0 %  Eosinophil Absolute Count 0.1 0.0 - 1.0 x 1000/?L  Basophil 0.4 0.0 - 4.0 %  Basophil Absolute Count 0.0 0.0 - 0.0 x 1000/?L  Immature Granulocytes 0.4 0.0 - 3.0 %  Absolute Immature Granulocyte Count 0.0 0.0 - 0.3 x 1000/?L  nRBC 0.0 0.0 - 1.0 %  Absolute nRBC 0.0 0.0 - 0.0 x 1000/?L  MCH 31.4 (H) 27.0 - 31.0 pg Comprehensive metabolic panel Result Value Ref Range  Sodium 135 (L) 136 - 144 mmol/L  Potassium 4.1 3.3 - 5.1 mmol/L  Chloride 103 98 - 107 mmol/L  CO2 23 20 - 30 mmol/L  Anion Gap 9 7 - 17  Glucose 119 (H) 70 - 100 mg/dL  BUN 17 8 - 23 mg/dL  Creatinine 3.66 (H) 4.40 - 1.30 mg/dL  Calcium 9.1 8.8 - 34.7 mg/dL  BUN/Creatinine Ratio 42.5 8.0 - 23.0  Total Protein 7.3 6.6 - 8.7 g/dL  Albumin 4.0 3.6 - 4.9 g/dL  Total Bilirubin 0.3 <=9.5 mg/dL  Alkaline Phosphatase 95 9 - 122 U/L  Alanine Aminotransferase (ALT) 23 9 - 59 U/L  Aspartate Aminotransferase (AST) 16 10 - 35 U/L  Globulin 3.3 2.3 - 3.5 g/dL  A/G Ratio 1.2 1.0 - 2.2  AST/ALT Ratio 0.7 See Comment  eGFR (Afr Amer) 60 >60 mL/min/1.22m2  eGFR (NON African-American) 49 >60 mL/min/1.70m2  Diagnostic Imaging: [place] Chest w IV contrast: 7/31/2021No significant change in tumor evidenced by unchanged bilateral pulmonary metastases, mediastinal, hilar and axillary lymphadenopathy. Some pulmonary metastases are cavitary.? Abd/Pelv: 7/31/2021Decrease in size of retroperitoneal/retrocrural lymph nodes which now all measure less than 1 cm.Impression: Paul Hunter is a 69 y.o. man with 75py smoking history, history of CAD s/p MIx2 and PCI, diagnosed with stage IV NSCLC in the fall of 2020. ?He was started on treatment with Carbo/Taxol/Pembrolizumab on 12/25/19 in [place] where he received?2 cycles. ?He tolerated treatment?relatively well overall, but has developed some neuropathy following?the?second cycle.?Taxol was dose reduced by 20% starting with cycle 3 due to neuropathy. He completed cycle 4 on 7/15. ?Scans from 7/31 overall shows stable/improving disease, including improvements in his RP LAD. However, he continues to experience significant DOE of unclear etiology. Is undergoing empiric bronchodilator therapy with minimal improvement.  Is now followed by cardio-oncology with plan for cardiac MRI and PET  stress.  We will continue to defer starting his pembro maintenance therapy pending his work-up. Plan: - SCC: now s/p cycle 4. Restaging scans show disease response, overall SD. HOLD pembro maintenance therapy while undergoing cardiac work-up.?- Dyspnea: Evaluated by Pulm and undergoing empiric LAMA/SABA trial for emphysema. Is also now followed by Cardio Onc (see below).  Encouraged him to schedule with Sleep Clinic as previously recommended by Pulm. 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 Is on gabapentin for spinal stenosis, though will recommend uptitrating to 600mg  qhs for additional benefit of hand neuropathy. Refill sent at his request.  Would anticipate neuropathy to improve now that he has completed taxol. ?- Elevated Cr:  At  Baseline today  ~1.3-1.4 ?- Spinal stenosis: Ortho scheduled for 10/13.  He will call to see about getting this moved up.      ?- H/o CAD and CHF: Seen by cardio-oncology on 8/13 with plan for cardiac MRI and PET stress.  Need to rule out IO-induced cardio-myopathy before continuing treatment with Pembro.  Will see about moving up cardiac MRI (currently scheduled for October).   He knows to call the office with any new or worsening symptoms.  Deidre Ala, APRN

## 2020-04-19 ENCOUNTER — Inpatient Hospital Stay: Admit: 2020-04-19 | Discharge: 2020-04-19 | Payer: PRIVATE HEALTH INSURANCE

## 2020-04-19 ENCOUNTER — Encounter
Admit: 2020-04-19 | Payer: PRIVATE HEALTH INSURANCE | Attending: Student in an Organized Health Care Education/Training Program

## 2020-04-19 DIAGNOSIS — E1159 Type 2 diabetes mellitus with other circulatory complications: Secondary | ICD-10-CM

## 2020-04-19 DIAGNOSIS — E785 Hyperlipidemia, unspecified: Secondary | ICD-10-CM

## 2020-04-19 DIAGNOSIS — I514 Myocarditis, unspecified: Secondary | ICD-10-CM

## 2020-04-19 DIAGNOSIS — I10 Essential (primary) hypertension: Secondary | ICD-10-CM

## 2020-04-19 DIAGNOSIS — I77810 Thoracic aortic ectasia: Secondary | ICD-10-CM

## 2020-04-19 DIAGNOSIS — I2510 Atherosclerotic heart disease of native coronary artery without angina pectoris: Secondary | ICD-10-CM

## 2020-04-19 MED ORDER — PREDNISONE 10 MG TABLET
10 mg | ORAL_TABLET | ORAL | 1 refills | Status: SS
Start: 2020-04-19 — End: 2020-05-30

## 2020-04-19 MED ORDER — LISINOPRIL 2.5 MG TABLET
2.5 mg | ORAL_TABLET | Freq: Every day | ORAL | 12 refills | Status: SS
Start: 2020-04-19 — End: 2020-05-12

## 2020-04-19 MED ORDER — GADOTERATE MEGLUMINE 0.5 MMOL/ML (376.9 MG/ML) INTRAVENOUS SOLUTION
0.5 mmol/mL (376.9 mg/mL) | Freq: Once | INTRAVENOUS | Status: CP | PRN
Start: 2020-04-19 — End: ?
  Administered 2020-04-19: 15:00:00 0.5 mL via INTRAVENOUS

## 2020-04-20 ENCOUNTER — Ambulatory Visit: Admit: 2020-04-20 | Payer: PRIVATE HEALTH INSURANCE | Attending: Critical Care Medicine

## 2020-04-20 ENCOUNTER — Inpatient Hospital Stay: Admit: 2020-04-20 | Discharge: 2020-04-20 | Payer: PRIVATE HEALTH INSURANCE

## 2020-04-20 ENCOUNTER — Ambulatory Visit: Admit: 2020-04-20 | Payer: PRIVATE HEALTH INSURANCE | Attending: Pulmonary Disease

## 2020-04-20 DIAGNOSIS — I514 Myocarditis, unspecified: Secondary | ICD-10-CM

## 2020-04-20 DIAGNOSIS — G4730 Sleep apnea, unspecified: Secondary | ICD-10-CM

## 2020-04-20 DIAGNOSIS — I2510 Atherosclerotic heart disease of native coronary artery without angina pectoris: Secondary | ICD-10-CM

## 2020-04-20 DIAGNOSIS — G4733 Obstructive sleep apnea (adult) (pediatric): Secondary | ICD-10-CM

## 2020-04-20 NOTE — Progress Notes
[facility] [address] [place], [place] [id number] [address], [address], [place], [place] [address]QQVZD: [phone]         Fax: [phone] Jarold Motto, MD - Hermine Messick, MBA, PA-C - Steffanie Dunn, MD - Lorenza Evangelist, MD, PhDBrian Pixie Casino, MD - Jolayne Haines, MD - Vahid Mohsenin, [facility] [place], PsyD - Wynona Neat, MD - Donovan Kail, MD, MS, Medical Director Valora Piccolo, MD, MPH, DirectorVIDEO TELEHEALTH VISIT: This clinician is part of the telehealth program and is conducting this visit in a currently approved location. For this visit the clinician and patient were present via interactive audio & video telecommunications system that permits real-time communications.Consent was signed via the Patient Acknowledgement and Financial Authorization Form and the Ambulatory Telehealth Consent Form. State patient is located in: [place]The clinician is appropriately licensed in the above state to provide care for this visit. Other individuals present during the telehealth encounter and their role/relation: noneIf billing based on time, please complete (Not required if billing based on MDM):                           Total time spent in medical video consultation: 15 min; Total time spent by the provider on the day of service, which includes time spent on chart review, medical video consultation, education, coordination of care/services and counseling Because this visit was completed over video, a hands-on physical exam was not performed.  Patient/parent or guardian understands and knows to call back if condition changes.Patient Name:  Paul Baranek AstorinoDate of Birth:  1952/12/22Date of Service: 8/25/2021EPIC MRN:  [id number] Provider:  Jolayne Haines, MDREASON FOR VISIT No chief complaint on file. Follow-up to see how patient is doing on treatment for sleep apnea.THE VISITThe patient is on treatment for sleep apnea which was diagnosed in 2018 in [place]. His last known Apnea Hypopnea index is unknown, but the patient states that he was diagnosed with severe sleep apnea. The patient is on CPAP.He was prescribed Lexapro because he was having vivid dreams. The Lexapro helped but about a month ago he started having vivid dreams again.He was diagnosed with Stage IV Non-Small Cell Lung Cancer in February 2021. He states that he is short of breath all the time.The patient is currently using PAP 7 of 7 nights and for 12-14 hours each 24 hour period. The patient has been having problems with excessive daytime sleepiness and waking up 2-3 times a night, however he states that he also has back problems which may be waking him up during the night.Adherence indicates: 100%. Efficacy data AHI = unknown.His data is not available to me. His DME is not local.Clinically the patient is stable.PAP therapy:The patient is fully compliant with PAP therapy per Medicare guidelines.The patient is benefiting from PAP therapy with improved symptoms of sleep apnea.The patient is not having difficulty tolerating PAP therapy.Review of Systems:Is as above.Otherwise negative in full system review.Immunization History Administered Date(s) Administered ? COVID-19 Vaccine - PFIZER 10/29/2019, 11/26/2019 ? Influenza, injectable, quadrivalent, preservative free 05/29/2019 ? Td 11/05/2008 ? ZOSTER RECOMBINANT (Shingrix) 05/29/2019 Past Medical History :He  has a past medical history of Anxiety, CAD (coronary artery disease), Diabetes mellitus (HC Code), GERD (gastroesophageal reflux disease), Hyperlipidemia, Hypertension, Hypertrophy of prostate without urinary obstruction and other lower urinary tract symptoms (LUTS), Other testicular hypofunction, Spinal stenosis (09/28/2018), Tobacco abuse, and Vitamin D deficiency. Social History:  He  reports that  he has been smoking cigarettes. He does not have any smokeless tobacco history on file. No history on file for alcohol use and drug use.Family History:  He family history is not on file.Allergies:He has No Known Allergies.Medications:Outpatient Encounter Medications as of 04/20/2020 Medication Sig Dispense Refill ? aspirin 81 MG EC tablet Take 81 mg by mouth daily.   ? atorvastatin (LIPITOR) 80 mg tablet Take 1 tablet (80 mg total) by mouth nightly. 30 tablet 6 ? escitalopram oxalate (LEXAPRO) 10 mg tablet Take 1 tablet (10 mg total) by mouth nightly. 90 tablet 3 ? fexofenadine (ALLEGRA) 180 mg tablet Take 180 mg by mouth daily as needed.   ? gabapentin (NEURONTIN) 300 mg capsule Take 2 capsules (600 mg total) by mouth at bedtime. 60 capsule 0 ? icosapent ethyL (VASCEPA) 1 gram capsule Take 2 capsules (2 g total) by mouth 2 (two) times daily (0800, 1800). 60 capsule 6 ? lisinopriL (PRINIVIL,ZESTRIL) 2.5 mg tablet Take 1 tablet (2.5 mg total) by mouth daily. 30 tablet 11 ? multivitamin capsule Take 1 capsule by mouth daily.   ? polyethylene glycol (GLYCOLAX; MIRALAX) 17 gram/dose powder Take by mouth.   ? predniSONE (DELTASONE) 10 mg tablet Take 13 tablets (130 mg total) by mouth daily for 7 days, THEN 12 tablets (120 mg total) daily for 7 days, THEN 11 tablets (110 mg total) daily for 7 days, THEN 10 tablets (100 mg total) daily for 7 days, THEN 9 tablets (90 mg total) daily for 7 days, THEN 8 tablets (80 mg total) daily for 7 days, THEN 7 tablets (70 mg total) daily for 7 days, THEN 6 tablets (60 mg total) daily for 7 days, THEN 5 tablets (50 mg total) daily for 7 days, THEN 4 tablets (40 mg total) daily for 7 days. Take with food.. 595 tablet 0 ? [START ON 06/28/2020] predniSONE (DELTASONE) 10 mg tablet Take 3 tablets (30 mg total) by mouth daily for 7 days, THEN 2 tablets (20 mg total) daily for 7 days, THEN 1 tablet (10 mg total) daily for 7 days, THEN 0.5 tablets (5 mg total) daily for 7 days, THEN 0.5 tablets (5 mg total) daily for 7 days. Take with food.. 49 tablet 0 ? prochlorperazine (COMPAZINE) 10 mg tablet Take 1 tablet (10 mg total) by mouth every 6 (six) hours as needed for nausea. (Patient not taking: Reported on 03/10/2020) 60 tablet 1 ? tiotropium (SPIRIVA WITH HANDIHALER) 18 mcg capsule for inhalation Inhale 1 capsule (18 mcg total) into the lungs daily. (Patient not taking: Reported on 03/31/2020) 30 capsule 2 ? traMADoL (ULTRAM) 50 mg tablet Take 1 tablet (50 mg total) by mouth every 6 (six) hours as needed. (Patient not taking: Reported on 03/25/2020) 15 tablet 0 ? tumeric-ging-olive-oreg-capryl 100 mg-150 mg- 50 mg-150 mg Cap Take by mouth.   ? [DISCONTINUED] albuterol sulfate 90 mcg/actuation HFA aerosol inhaler    No facility-administered encounter medications on file as of 04/20/2020. Physical Examination:Limited physical examination due to video visit.  Unable to obtain vitals due to video visit.  General: NAD REVIEW OF DATA:No flowsheet data found.No flowsheet data found.No flowsheet data found.Otherwise reviewed in EMR.Impression/Plan:This is a 69 y.o.-year-old male with sleep apnea.I asked the patient to find his last sleep study results and the last data download from his CPAP machine. I have also ordered a mask acclimation clinic to try to obtain adherence and . Efficacy data.I also asked the patient to talk to his PCP about decreasing the dose of Lexapro, as this  may be contributing to his daytime sleepiness.This patient has OSA on CPAP and is adherent to treatment per CMS guidelines and benefiting from treatment with improved alertness. To return in one year.It is a pleasure to participate in the care of this patient.  Please feel free to contact me with any questions or concerns.Sincerely,Otis Burress MD (signed electronically in [facility] Epic)NPI [id number] August 25, 202110:04 AMScribed for Jolayne Haines, MD by Merlene Pulling, medical scribe August 25, 2021The documentation recorded by the scribe accurately reflects the services I personally performed and the decisions made by me. I reviewed and confirmed all material entered and/or pre-charted by the scribe.

## 2020-04-21 ENCOUNTER — Telehealth: Admit: 2020-04-21 | Payer: PRIVATE HEALTH INSURANCE | Attending: Medical

## 2020-04-21 LAB — NT-PROBNPE: BKR B-TYPE NATRIURETIC PEPTIDE, PRO (PROBNP): 317 pg/mL — ABNORMAL HIGH (ref <125.0)

## 2020-04-21 LAB — TROPONIN T     (Q): BKR TROPONIN T: <0.01 ng/mL (ref <0.01)

## 2020-04-21 NOTE — Telephone Encounter
APP ON CALL TELEPHONE ENCOUNTERCalled by patient due to concern for abnormal cardiac laboratories. Returned call and [facility] for patient to return call to discuss. Note routed to primary team to follow up.Lupe Carney PA-C

## 2020-04-21 NOTE — Other
Trop T most recently still negative, BNP went up slightly as FYI

## 2020-04-22 ENCOUNTER — Encounter: Admit: 2020-04-22 | Payer: PRIVATE HEALTH INSURANCE | Attending: Cardiovascular Disease

## 2020-04-22 NOTE — Progress Notes
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  Will get a troponin tomorrow and trend serial troponins (starting weekly, then space out if negative) and start on steroids (1mg /kg) (130mg  start) and taper 10mg /week (monitor trop and echo to make sure lvef is stable/trop neg) and repeat cMRI in 6-8 weeks to see if those findings improve/resolve. Will repeat echo now as well for correlation.He has a PET stress coming up 9/10. If troponins are negative, can still consider doing the pet stress, but if there are +troponins, will consider canceling and doing LHC instead; given the cMRI does not reveal any obvious enhancement at rv septum or t2 positivity there, yield for a embx will likely be low.For cardiomyopathy, will start lisinopril 2.5mg  daily, repeat chem next weekCalled pt to let him know and answered his questions.cMRI impression1. Mildly enlarged left ventricular size. Mildly depressed left ventricular systolic function. LVEF: 44%. There is basal to mid inferoseptal/inferior wall hypokinesis and thinning. There is moderate asymmetric myocardial hypertrophy with the basal to mid septum measuring the thickest, up to 16mm at the basal anteroseptum and 16mm at the mid interoseptum without SAM.  There is fatty replacement of myocardium in the mid apical anteroseptum and mid inferolateral wall. Global Native T1 average is reduced at 819 +/- .   T2-weighted imaging demonstrates possible high signal intensity that is heterogeneous in the basal lateral/inferolateral wall and mid inferior wall suggestive of the presence of myocardial edema.  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 Findings compatible with possible myocarditis (and in the right clinical context, checkpoint inhibitor myocarditis) and known prior inferior/inferoseptal infarct2. Normal right ventricular size. Mildly depressed right ventricular systolic function.  RVEF: 48%. No right ventricular delayed myocardial enhancement.3. No significant valvular abnormalities.4. Moderate biatrial enlargement5. There is ascending aorta dilatation measuring 40mm and main pulmonary artery enlargement measuring 41mm    Consider repeat cMRI in 6-8 weeks for monitoring

## 2020-04-25 ENCOUNTER — Telehealth: Admit: 2020-04-25 | Payer: PRIVATE HEALTH INSURANCE | Attending: Cardiovascular Disease

## 2020-04-25 ENCOUNTER — Encounter: Admit: 2020-04-25 | Payer: PRIVATE HEALTH INSURANCE | Attending: Hematology

## 2020-04-25 ENCOUNTER — Telehealth: Admit: 2020-04-25 | Payer: PRIVATE HEALTH INSURANCE | Attending: Hematology

## 2020-04-25 ENCOUNTER — Inpatient Hospital Stay: Admit: 2020-04-25 | Discharge: 2020-04-25 | Payer: PRIVATE HEALTH INSURANCE

## 2020-04-25 DIAGNOSIS — I2510 Atherosclerotic heart disease of native coronary artery without angina pectoris: Secondary | ICD-10-CM

## 2020-04-25 DIAGNOSIS — R310 Gross hematuria: Secondary | ICD-10-CM

## 2020-04-25 DIAGNOSIS — C3490 Malignant neoplasm of unspecified part of unspecified bronchus or lung: Secondary | ICD-10-CM

## 2020-04-25 DIAGNOSIS — E1159 Type 2 diabetes mellitus with other circulatory complications: Secondary | ICD-10-CM

## 2020-04-25 DIAGNOSIS — J910 Malignant pleural effusion: Secondary | ICD-10-CM

## 2020-04-25 NOTE — Telephone Encounter
RTC to pt r/t sudden onset of hematuria which pt states has happened in past.  Pt denies fever/chills/back or abdominal pain.  Pt stated the hematuria happened this AM and twice since his morning void.  Pt will go for urine U/A and culture.

## 2020-04-25 NOTE — Telephone Encounter
Recvd call from [place].he had 2 episodes of blood and blood clots in his urine w/i the last 2 hours. No pain. Would like a call back. Aristide [phone]

## 2020-04-25 NOTE — Telephone Encounter
Called pt to let him know of the Trop T result and bnp, will repeat troponin this week and if still negative, pt will do his pet stress as planned; he is taking his steroids as rx. Denies any new CP/SOB/palpitations other than usual baseline SOB, no wheezing.Pt also endorsed some frank blood in his urine, painless, that has been going on and off for past 3 yrs, said he had a cystoscopy in the past and they did not find anything; advised him to let his oncologist know and he said he did and referred to urology. Advised pt to go to the ED should it recur to make sure he is not getting too anemic. Pt denies any new lightheadedness.

## 2020-04-25 NOTE — Telephone Encounter
Patient requesting a call back regarding blood work results.

## 2020-04-26 ENCOUNTER — Telehealth: Admit: 2020-04-26 | Payer: PRIVATE HEALTH INSURANCE

## 2020-04-26 LAB — URINE MICROSCOPIC     (BH GH LMW YH)
BKR HYALINE CASTS, UA INSTRUMENT (NUMERIC): 0 /LPF (ref 0–3)
BKR PH, UA: 0 /LPF (ref 0–3)
BKR RBC/HPF INSTRUMENT: 31 /HPF — ABNORMAL HIGH (ref 0–2)
BKR WBC/HPF INSTRUMENT: 7 /HPF — ABNORMAL HIGH (ref 0–5)

## 2020-04-26 LAB — URINALYSIS-MACROSCOPIC W/REFLEX MICROSCOPIC
BKR BILIRUBIN, UA: NEGATIVE
BKR GLUCOSE, UA: NEGATIVE /HPF — ABNORMAL HIGH
BKR KETONES, UA: NEGATIVE
BKR LEUKOCYTE ESTERASE, UA: NEGATIVE
BKR NITRITE, UA: NEGATIVE
BKR SPECIFIC GRAVITY, UA: 1.027 /LPF (ref 1.005–1.030)
BKR UROBILINOGEN, UA: <2 EU/dL (ref <=2.0)

## 2020-04-26 LAB — URINE CULTURE: BKR URINE CULTURE, ROUTINE: NO GROWTH

## 2020-04-26 NOTE — Telephone Encounter
1st attempt LM for patient to call and confirm appt for 9/3 @ 9:30AM for hematuria please assist in confirming appt and link referral thank you

## 2020-04-27 NOTE — Other
Negative for infection.  Referred to urology for intermittent hematuria.

## 2020-04-29 ENCOUNTER — Ambulatory Visit: Admit: 2020-04-29 | Payer: PRIVATE HEALTH INSURANCE | Attending: Urology

## 2020-04-29 ENCOUNTER — Encounter: Admit: 2020-04-29 | Payer: PRIVATE HEALTH INSURANCE | Attending: Urology

## 2020-04-29 DIAGNOSIS — E559 Vitamin D deficiency, unspecified: Secondary | ICD-10-CM

## 2020-04-29 DIAGNOSIS — R319 Hematuria, unspecified: Secondary | ICD-10-CM

## 2020-04-29 DIAGNOSIS — F419 Anxiety disorder, unspecified: Secondary | ICD-10-CM

## 2020-04-29 DIAGNOSIS — J910 Malignant pleural effusion: Secondary | ICD-10-CM

## 2020-04-29 DIAGNOSIS — E1159 Type 2 diabetes mellitus with other circulatory complications: Secondary | ICD-10-CM

## 2020-04-29 DIAGNOSIS — E291 Testicular hypofunction: Secondary | ICD-10-CM

## 2020-04-29 DIAGNOSIS — E119 Type 2 diabetes mellitus without complications: Secondary | ICD-10-CM

## 2020-04-29 DIAGNOSIS — Z720 Tobacco use: Secondary | ICD-10-CM

## 2020-04-29 DIAGNOSIS — I2510 Atherosclerotic heart disease of native coronary artery without angina pectoris: Secondary | ICD-10-CM

## 2020-04-29 DIAGNOSIS — E785 Hyperlipidemia, unspecified: Secondary | ICD-10-CM

## 2020-04-29 DIAGNOSIS — N400 Enlarged prostate without lower urinary tract symptoms: Secondary | ICD-10-CM

## 2020-04-29 DIAGNOSIS — I10 Essential (primary) hypertension: Secondary | ICD-10-CM

## 2020-04-29 DIAGNOSIS — M4800 Spinal stenosis, site unspecified: Secondary | ICD-10-CM

## 2020-04-29 DIAGNOSIS — K219 Gastro-esophageal reflux disease without esophagitis: Secondary | ICD-10-CM

## 2020-04-29 LAB — URINALYSIS-MACROSCOPIC W/REFLEX MICROSCOPIC
BKR BILIRUBIN, UA: NEGATIVE /HPF — AB
BKR LEUKOCYTE ESTERASE, UA: POSITIVE — AB
BKR NITRITE, UA: NEGATIVE
BKR PH, UA: 5.5 (ref 5.5–7.5)
BKR SPECIFIC GRAVITY, UA: 1.031 — ABNORMAL HIGH (ref 1.005–1.030)
BKR UROBILINOGEN, UA: 2 EU/dL (ref <=2.0)

## 2020-04-29 LAB — URINE MICROSCOPIC     (BH GH LMW YH)

## 2020-04-29 NOTE — Progress Notes
New Patient Paul Hunter is a 69 y.o. male  Who was referred to Urology by Dr. Lehman Prom for gross hematuria. Over the last 3 years he has had intermittent hematuria with clots and sloughing of tissue. Clots have varied in size from BB size to larger than dimes. He has been seen previously by Dr. Modesto Charon and Dr. Concha Pyo of [facility] more than 3 years ago for the same thing. Underwent a complete diagnostic exam including cystoscopy with no significant pathology at that time. He has previously presented for gross hematuria in the past as well on a few occasions and was treated at that time for a UTI.At present he denies any dysuria, gross hematuria, flank pain, recent infections, incontinence, straining fever, chills or rigors. Symptoms have a sudden onset and typically last 3 days in duration resolving spontaneously.Hx smokingDaily ASA 81mg Hx of Kidney stonesStage IV squamous cell carcinoma of the lungFinished 4 chemo cocktails, began Keytruda but on hold for cardiac concerns.Results for Paul Hunter, Paul Hunter (MRN [id number]) as of 04/29/2020 10:31 04/29/2020 09:35 Color, urine yellow Ketone, urine Positive Specific Gravity, urine 1.030 Protein, urine Positive Nitrates, urine Negative Leukocytes, urine Negative Blood, urine 3+ Results for Paul Hunter, Paul Hunter (MRN [id number]) as of 04/29/2020 10:31 04/25/2020 16:04 Clarity, UA Cloudy (A) Specific Gravity, UA 1.027 pH, UA 5.5 Protein, UA 1+ (A) Glucose, UA Negative Ketones, UA Negative Blood, UA 3+ (A) Bilirubin, UA Negative Leukocytes, UA Negative Nitrite, UA Negative Hyaline Casts, UA 0 WBC/HPF 7 (H) RBC/HPF 31 (H) Bacteria, UA None Color, UA Yellow Urobilinogen, UA <2.0 Epithelial Cells None Past Medical HistoryPast Medical History: Diagnosis Date ? Anxiety  ? CAD (coronary artery disease)  ? Diabetes mellitus (HC Code)  ? GERD (gastroesophageal reflux disease)  ? Hyperlipidemia  ? Hypertension  ? Hypertrophy of prostate without urinary obstruction and other lower urinary tract symptoms (LUTS)  ? Other testicular hypofunction  ? Spinal stenosis 09/28/2018 ? Tobacco abuse  ? Vitamin D deficiency  Past Surgical HistoryPast Surgical History: Procedure Laterality Date ? CAROTID STENT  08/28/2003 ? CHOLECYSTECTOMY  11/07/2016 ? KNEE SURGERY    torn meniscus ? LAMINECTOMY  10/07/2008 ? LUMBAR DISC SURGERY    herniated disc AllergiesNo Known AllergiesMedicationsOutpatient Encounter Medications as of 04/29/2020 Medication Sig Dispense Refill ? aspirin 81 MG EC tablet Take 81 mg by mouth daily.   ? atorvastatin (LIPITOR) 80 mg tablet Take 1 tablet (80 mg total) by mouth nightly. 30 tablet 6 ? escitalopram oxalate (LEXAPRO) 10 mg tablet Take 1 tablet (10 mg total) by mouth nightly. 90 tablet 3 ? fexofenadine (ALLEGRA) 180 mg tablet Take 180 mg by mouth daily as needed.   ? gabapentin (NEURONTIN) 300 mg capsule Take 2 capsules (600 mg total) by mouth at bedtime. 60 capsule 0 ? icosapent ethyL (VASCEPA) 1 gram capsule Take 2 capsules (2 g total) by mouth 2 (two) times daily (0800, 1800). 60 capsule 6 ? lisinopriL (PRINIVIL,ZESTRIL) 2.5 mg tablet Take 1 tablet (2.5 mg total) by mouth daily. 30 tablet 11 ? multivitamin capsule Take 1 capsule by mouth daily.   ? polyethylene glycol (GLYCOLAX; MIRALAX) 17 gram/dose powder Take by mouth.   ? predniSONE (DELTASONE) 10 mg tablet Take 13 tablets (130 mg total) by mouth daily for 7 days, THEN 12 tablets (120 mg total) daily for 7 days, THEN 11 tablets (110 mg total) daily for 7 days, THEN 10 tablets (100 mg total) daily for 7 days, THEN 9 tablets (90 mg total) daily for 7 days, THEN  8 tablets (80 mg total) daily for 7 days, THEN 7 tablets (70 mg total) daily for 7 days, THEN 6 tablets (60 mg total) daily for 7 days, THEN 5 tablets (50 mg total) daily for 7 days, THEN 4 tablets (40 mg total) daily for 7 days. Take with food.. 595 tablet 0 ? [START ON 06/28/2020] predniSONE (DELTASONE) 10 mg tablet Take 3 tablets (30 mg total) by mouth daily for 7 days, THEN 2 tablets (20 mg total) daily for 7 days, THEN 1 tablet (10 mg total) daily for 7 days, THEN 0.5 tablets (5 mg total) daily for 7 days, THEN 0.5 tablets (5 mg total) daily for 7 days. Take with food.. 49 tablet 0 ? prochlorperazine (COMPAZINE) 10 mg tablet Take 1 tablet (10 mg total) by mouth every 6 (six) hours as needed for nausea. 60 tablet 1 ? tiotropium (SPIRIVA WITH HANDIHALER) 18 mcg capsule for inhalation Inhale 1 capsule (18 mcg total) into the lungs daily. 30 capsule 2 ? traMADoL (ULTRAM) 50 mg tablet Take 1 tablet (50 mg total) by mouth every 6 (six) hours as needed. 15 tablet 0 ? tumeric-ging-olive-oreg-capryl 100 mg-150 mg- 50 mg-150 mg Cap Take by mouth.   ? [DISCONTINUED] albuterol sulfate 90 mcg/actuation HFA aerosol inhaler    No facility-administered encounter medications on file as of 04/29/2020.  Social HistorySocial History Tobacco Use ? Smoking status: Current Every Day Smoker   Types: Cigarettes Substance Use Topics ? Alcohol use: Not on file ? Drug use: Not on file  Family History No family history on file.Review of Systems No Symptoms other than those noted in the HPI and PMH - all other systems negativePhysical ExamBP 134/80 (Site: r a, Position: Sitting, Cuff Size: Large)  - Pulse 83  - Temp 97.4 ?F (36.3 ?C)  - Ht 6' (1.829 m)  - Wt 131.5 kg  - SpO2 96%  - BMI 39.33 kg/m? No results found for: UCOLOR, UGLUCOSE, UKETONE, USPECGRAVITY, UPH, UPROTEIN, UNITRATES, UBLOOD, ULEUKOCYTES  No results found for: PVRPOCTPhysical Exam Appearance: Normal appearing male HENT: normocephalic and atraumatic.Neck. Neck is supple Chest: Normal respiratory effort without distress. Abdomen: Soft, nontender without masses.DRE: Deferred.Musculoskeletal: Normal range of motion. Skin: Skin is warm without obvious lesions. Mental status: Awake and alert.Assessment & Plan:Paul Hunter is a 69 y.o. male with a history of recurrent gross hematuira with a complex medical history and a significant smoking hx. He has previously been evaluated for hematuria but the workup has been benign in the past. He has developed ongoing intermittent gross hematuria with varied frequency that cannot be attributed to any specific precipitating factors. He has a Hx of Kidney stones but has not experienced any flank pain or associated symptoms typical of his presentation. He denies any dysuria or frequency suspicious for UTI and urine in office did not show any leukocytes or nitrates further supporting this. 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 In office POCT showed +3 blood and was positive for proteins and ketones. He is currently being managed for his lung Ca by Dr. Layla Barter. Plan:UACytologyCT NephrogramCystoFollow ZO:XWRU available provider for cysto after [place] NephrogramEncounter Diagnoses Name SNOMED [place](R) Primary? ? Hematuria, unspecified type BLOOD IN URINE Yes Tawni Pummel, APRN

## 2020-04-30 ENCOUNTER — Encounter: Admit: 2020-04-30 | Payer: PRIVATE HEALTH INSURANCE | Attending: Urology

## 2020-04-30 DIAGNOSIS — I2510 Atherosclerotic heart disease of native coronary artery without angina pectoris: Secondary | ICD-10-CM

## 2020-04-30 DIAGNOSIS — R319 Hematuria, unspecified: Secondary | ICD-10-CM

## 2020-04-30 DIAGNOSIS — C3490 Malignant neoplasm of unspecified part of unspecified bronchus or lung: Secondary | ICD-10-CM

## 2020-04-30 DIAGNOSIS — R310 Gross hematuria: Secondary | ICD-10-CM

## 2020-05-03 ENCOUNTER — Encounter: Admit: 2020-05-03 | Payer: PRIVATE HEALTH INSURANCE | Attending: Hematology

## 2020-05-04 ENCOUNTER — Ambulatory Visit: Admit: 2020-05-04 | Payer: PRIVATE HEALTH INSURANCE | Attending: Pain Medicine

## 2020-05-04 LAB — URINE CULTURE

## 2020-05-05 ENCOUNTER — Telehealth: Admit: 2020-05-05 | Payer: PRIVATE HEALTH INSURANCE | Attending: Urology

## 2020-05-05 NOTE — Telephone Encounter
Called and spoke with PT regarding cytology and UC. He is aware of results. He reports having another episode of gross hematuria over night that again resolved spontaneously. At the moment he feels well, no lightheadedness or dizziness, no CP or SOB, urinating freely without dysuria.

## 2020-05-06 ENCOUNTER — Encounter
Admit: 2020-05-06 | Payer: PRIVATE HEALTH INSURANCE | Attending: Student in an Organized Health Care Education/Training Program

## 2020-05-06 ENCOUNTER — Inpatient Hospital Stay: Admit: 2020-05-06 | Discharge: 2020-05-06 | Payer: PRIVATE HEALTH INSURANCE

## 2020-05-06 DIAGNOSIS — E785 Hyperlipidemia, unspecified: Secondary | ICD-10-CM

## 2020-05-06 DIAGNOSIS — I10 Essential (primary) hypertension: Secondary | ICD-10-CM

## 2020-05-06 DIAGNOSIS — J439 Emphysema, unspecified: Secondary | ICD-10-CM

## 2020-05-06 DIAGNOSIS — E1159 Type 2 diabetes mellitus with other circulatory complications: Secondary | ICD-10-CM

## 2020-05-06 DIAGNOSIS — I2510 Atherosclerotic heart disease of native coronary artery without angina pectoris: Secondary | ICD-10-CM

## 2020-05-06 DIAGNOSIS — Z9221 Personal history of antineoplastic chemotherapy: Secondary | ICD-10-CM

## 2020-05-06 MED ORDER — REGADENOSON 0.4 MG/5 ML INTRAVENOUS SYRINGE
0.4 mg/5 mL | Freq: Once | INTRAVENOUS | Status: CP
Start: 2020-05-06 — End: ?
  Administered 2020-05-06: 14:00:00 0.4 mL via INTRAVENOUS

## 2020-05-06 MED ORDER — CLOPIDOGREL 75 MG TABLET
75 mg | ORAL_TABLET | Freq: Every day | ORAL | 7 refills | Status: SS
Start: 2020-05-06 — End: 2020-05-20

## 2020-05-06 MED ORDER — RUBIDIUM RB-82, DIAGNOSTIC
Freq: Once | INTRAVENOUS | Status: CP | PRN
Start: 2020-05-06 — End: ?
  Administered 2020-05-06: 14:00:00 via INTRAVENOUS

## 2020-05-06 MED ORDER — AMINOPHYLLINE 250 MG/10 ML INTRAVENOUS SOLUTION
250 mg/10 mL | Freq: Once | INTRAVENOUS | Status: CP | PRN
Start: 2020-05-06 — End: ?
  Administered 2020-05-06: 15:00:00 250 mL via INTRAVENOUS

## 2020-05-06 MED ORDER — AMINOPHYLLINE 250 MG/10 ML INTRAVENOUS SOLUTION
250 mg/10 mL | Status: CP
Start: 2020-05-06 — End: ?

## 2020-05-06 MED ORDER — SODIUM CHLORIDE 0.9 % (FLUSH) INJECTION SYRINGE
0.9 % | Freq: Once | INTRAVENOUS | Status: CP
Start: 2020-05-06 — End: ?
  Administered 2020-05-06: 14:00:00 0.9 mL via INTRAVENOUS

## 2020-05-06 NOTE — Other
Pt had high risk abnormal PET stress with 2 reversible perfusion defects (large sized, moderate to severe intensity, reversible perfusion defect in the basal to apical inferior and inferolateral walls consistent with ischemia; and a medium to large sized, moderate intensity, reversible perfusion defect in the mid to apical anterior and anterolateral walls consistent with ischemia), with known prior CAD w/4 stents in the past. 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 Cystoscopy (10/20) and [place] abd (9/28) plannedI let pt know of the stress result and will trial plavix to see if he can tolerate, will get trop, cbc and repeat echoIf he can tolerate plavix can set him up with a left heart cath given he has what sounds like worsening symptoms; he has f/u with Shanda Bumps on 9/24; ideally can move his cystoscopy up.

## 2020-05-06 NOTE — Other
Pt had high risk abnormal PET stress with 2 reversible perfusion defects (large sized, moderate to severe intensity, reversible perfusion defect in the basal to apical inferior and inferolateral walls consistent with ischemia; and a medium to large sized, moderate intensity, reversible perfusion defect in the mid to apical anterior and anterolateral walls consistent with ischemia), with known prior CAD w/4 stents in the past. 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 Cystoscopy (10/20) and [place] abd (9/28) plannedI let pt know of the stress result and will trial plavix to see if he can tolerate, will get trop, cbc and repeat echoIf he can tolerate plavix can set him up with a left heart cath given he has what sounds like worsening symptoms; he has f/u with Shanda Bumps on 9/24; ideally can move his cystoscopy up.

## 2020-05-08 NOTE — Other
[place] chest for attenuation correction shows possible progression of disease and recommends consideration of nonurgent chest  for evaluation. Will defer that to the oncology team to order, thank you

## 2020-05-09 ENCOUNTER — Encounter: Admit: 2020-05-09 | Payer: PRIVATE HEALTH INSURANCE | Attending: Urology

## 2020-05-09 ENCOUNTER — Telehealth: Admit: 2020-05-09 | Payer: PRIVATE HEALTH INSURANCE

## 2020-05-09 ENCOUNTER — Telehealth: Admit: 2020-05-09 | Payer: PRIVATE HEALTH INSURANCE | Attending: Urology

## 2020-05-09 ENCOUNTER — Inpatient Hospital Stay
Admit: 2020-05-09 | Discharge: 2020-05-12 | Payer: PRIVATE HEALTH INSURANCE | Source: Home / Self Care | Admitting: Internal Medicine

## 2020-05-09 ENCOUNTER — Ambulatory Visit: Admit: 2020-05-09 | Payer: PRIVATE HEALTH INSURANCE | Attending: Hematology

## 2020-05-09 ENCOUNTER — Encounter: Admit: 2020-05-09 | Payer: PRIVATE HEALTH INSURANCE | Attending: Hematology

## 2020-05-09 ENCOUNTER — Inpatient Hospital Stay: Admit: 2020-05-09 | Discharge: 2020-05-09 | Payer: PRIVATE HEALTH INSURANCE

## 2020-05-09 DIAGNOSIS — C3490 Malignant neoplasm of unspecified part of unspecified bronchus or lung: Secondary | ICD-10-CM

## 2020-05-09 DIAGNOSIS — I10 Essential (primary) hypertension: Secondary | ICD-10-CM

## 2020-05-09 DIAGNOSIS — M4800 Spinal stenosis, site unspecified: Secondary | ICD-10-CM

## 2020-05-09 DIAGNOSIS — E291 Testicular hypofunction: Secondary | ICD-10-CM

## 2020-05-09 DIAGNOSIS — Z720 Tobacco use: Secondary | ICD-10-CM

## 2020-05-09 DIAGNOSIS — K219 Gastro-esophageal reflux disease without esophagitis: Secondary | ICD-10-CM

## 2020-05-09 DIAGNOSIS — R0600 Dyspnea, unspecified: Secondary | ICD-10-CM

## 2020-05-09 DIAGNOSIS — E785 Hyperlipidemia, unspecified: Secondary | ICD-10-CM

## 2020-05-09 DIAGNOSIS — R0609 Other forms of dyspnea: Secondary | ICD-10-CM

## 2020-05-09 DIAGNOSIS — F419 Anxiety disorder, unspecified: Secondary | ICD-10-CM

## 2020-05-09 DIAGNOSIS — E559 Vitamin D deficiency, unspecified: Secondary | ICD-10-CM

## 2020-05-09 DIAGNOSIS — I2510 Atherosclerotic heart disease of native coronary artery without angina pectoris: Secondary | ICD-10-CM

## 2020-05-09 DIAGNOSIS — N400 Enlarged prostate without lower urinary tract symptoms: Secondary | ICD-10-CM

## 2020-05-09 DIAGNOSIS — E119 Type 2 diabetes mellitus without complications: Secondary | ICD-10-CM

## 2020-05-09 LAB — COMPREHENSIVE METABOLIC PANEL
BKR A/G RATIO: 1.3 (ref 1.0–2.2)
BKR ALANINE AMINOTRANSFERASE (ALT): 47 U/L — ABNORMAL LOW (ref 9–59)
BKR ALBUMIN: 3.4 g/dL — ABNORMAL LOW (ref 3.6–4.9)
BKR ALKALINE PHOSPHATASE: 52 U/L (ref 9–122)
BKR ANION GAP: 9 % (ref 7–17)
BKR ASPARTATE AMINOTRANSFERASE (AST): 16 U/L (ref 10–35)
BKR AST/ALT RATIO: 0.3 % (ref 0.0–4.0)
BKR BILIRUBIN TOTAL: 0.3 mg/dL (ref <=1.2)
BKR BLOOD UREA NITROGEN: 32 mg/dL — ABNORMAL HIGH (ref 8–23)
BKR BUN / CREAT RATIO: 23.5 fL — ABNORMAL HIGH (ref 8.0–23.0)
BKR CALCIUM: 8.5 mg/dL — ABNORMAL LOW (ref 8.8–10.2)
BKR CHLORIDE: 103 mmol/L (ref 98–107)
BKR CO2: 24 mmol/L (ref 20–30)
BKR EGFR (AFR AMER): >60 mL/min/{1.73_m2} (ref >60)
BKR EGFR (NON AFRICAN AMERICAN): 52 mL/min/{1.73_m2} (ref >60)
BKR GLOBULIN: 2.6 g/dL (ref 2.3–3.5)
BKR GLUCOSE: 134 mg/dL — ABNORMAL HIGH (ref 70–100)
BKR POTASSIUM: 4.8 mmol/L — ABNORMAL HIGH (ref 3.3–5.1)
BKR PROTEIN TOTAL: 6 g/dL — ABNORMAL LOW (ref 6.6–8.7)
BKR SODIUM: 136 mmol/L (ref 136–144)

## 2020-05-09 LAB — CBC WITH AUTO DIFFERENTIAL
BKR WAM ABSOLUTE IMMATURE GRANULOCYTES: 0.2 x 1000/??L (ref 0.0–0.3)
BKR WAM ABSOLUTE LYMPHOCYTE COUNT: 1 x 1000/ÂµL (ref 1.0–4.0)
BKR WAM ABSOLUTE NRBC: 0 x 1000/??L (ref 0.0–0.0)
BKR WAM ANALYZER ANC: 13.8 x 1000/??L — ABNORMAL HIGH (ref 1.0–11.0)
BKR WAM BASOPHIL ABSOLUTE COUNT: 0 x 1000/??L (ref 0.0–0.0)
BKR WAM BASOPHILS: 0.1 % (ref 0.0–4.0)
BKR WAM EOSINOPHIL ABSOLUTE COUNT: 0 x 1000/??L (ref 0.0–1.0)
BKR WAM EOSINOPHILS: 0 % (ref 0.0–7.0)
BKR WAM HEMATOCRIT: 37.9 % (ref 37.0–52.0)
BKR WAM HEMOGLOBIN: 12.7 g/dL (ref 12.0–18.0)
BKR WAM IMMATURE GRANULOCYTES: 1.2 % (ref 0.0–3.0)
BKR WAM LYMPHOCYTES: 6.3 % — ABNORMAL LOW (ref 8.0–49.0)
BKR WAM MCH (PG): 31.8 pg — ABNORMAL HIGH (ref 27.0–31.0)
BKR WAM MCHC: 33.5 g/dL (ref 31.0–36.0)
BKR WAM MCV: 94.8 fL — ABNORMAL HIGH (ref 78.0–94.0)
BKR WAM MONOCYTE ABSOLUTE COUNT: 0.5 x 1000/ÂµL (ref 0.0–2.0)
BKR WAM MONOCYTES: 3.1 % — ABNORMAL LOW (ref 4.0–15.0)
BKR WAM MPV: 9.8 fL (ref 6.0–11.0)
BKR WAM NEUTROPHILS: 89.3 % — ABNORMAL HIGH (ref 37.0–84.0)
BKR WAM NUCLEATED RED BLOOD CELLS: 0 % (ref 0.0–1.0)
BKR WAM PLATELETS: 227 x1000/ÂµL (ref 140–440)
BKR WAM RDW-CV: 16 % — ABNORMAL HIGH (ref 11.5–14.5)
BKR WAM RED BLOOD CELL COUNT: 4 M/ÂµL (ref 3.8–5.9)
BKR WAM WHITE BLOOD CELL COUNT: 15.5 x1000/ÂµL — ABNORMAL HIGH (ref 4.0–10.0)

## 2020-05-09 LAB — TROPONIN T     (Q)
BKR TROPONIN T: 0.02 ng/mL — ABNORMAL HIGH (ref <0.01)
BKR TROPONIN T: <0.01 ng/mL (ref <0.01)

## 2020-05-09 LAB — NT-PROBNPE
BKR B-TYPE NATRIURETIC PEPTIDE, PRO (PROBNP): 1031 pg/mL — ABNORMAL HIGH (ref <125.0)
BKR B-TYPE NATRIURETIC PEPTIDE, PRO (PROBNP): 1120 pg/mL — ABNORMAL HIGH (ref <125.0)
BKR CREATININE: 1031 pg/mL — ABNORMAL HIGH (ref <125.0)

## 2020-05-09 LAB — SARS COV-2 (COVID-19) RNA-~~LOC~~ LABS (BH GH LMW YH): BKR SARS-COV-2 RNA (COVID-19) (YH): NEGATIVE

## 2020-05-09 LAB — MAGNESIUM: BKR MAGNESIUM: 2 mg/dL (ref 1.7–2.4)

## 2020-05-09 MED ORDER — ZZ IMS TEMPLATE
Freq: Once | ORAL | Status: CP
Start: 2020-05-09 — End: ?
  Administered 2020-05-10: 02:00:00 1 mg via ORAL

## 2020-05-09 MED ORDER — ZZ IMS TEMPLATE
Freq: Every day | ORAL | Status: DC
Start: 2020-05-09 — End: 2020-05-10

## 2020-05-09 MED ORDER — FUROSEMIDE 20 MG TABLET
20 mg | ORAL_TABLET | Freq: Every day | ORAL | 1 refills | Status: SS
Start: 2020-05-09 — End: 2020-05-12

## 2020-05-09 NOTE — Telephone Encounter
-----   Message from Ferne Reus, MD sent at 05/06/2020  4:43 PM EDT -----Regarding: Possible to move up cystoscopyHello,We have a mutual patient; he just had a positive stress test with 2 areas with large perfusion defects with progressive symptoms. Would it be possible to move up his cystoscopy to an earlier date? In case he needs a cath and stent, he would have to be on dual antiplatelet therapy and in order to do that, we need to figure out why he is having hematuria.Feel free to call with questions/suggestionsThank you,JenniferCardio-onc fellow

## 2020-05-09 NOTE — Telephone Encounter
Spoke with the patient.  Cystoscopy rescheduled for this Thursday 05/12/20 at 130 PM.   Patient verbalized understanding and repeated it back correctly. Electronically Signed by Ardeen Fillers, RN, May 09, 2020

## 2020-05-09 NOTE — Telephone Encounter
Please schedule this patient for cystoscopy this coming Thursday,  September 17th either 1:30 or 1:45.  RDV

## 2020-05-09 NOTE — Patient Instructions
Please help schedule echocardiogram ASAP, preferably within a weekLabs todaySchedule scans within 2 weeksReturn to clinic in 2 weeks after scans with labs same day

## 2020-05-10 ENCOUNTER — Encounter: Admit: 2020-05-10 | Payer: PRIVATE HEALTH INSURANCE

## 2020-05-10 DIAGNOSIS — K219 Gastro-esophageal reflux disease without esophagitis: Secondary | ICD-10-CM

## 2020-05-10 DIAGNOSIS — I2510 Atherosclerotic heart disease of native coronary artery without angina pectoris: Secondary | ICD-10-CM

## 2020-05-10 DIAGNOSIS — I10 Essential (primary) hypertension: Secondary | ICD-10-CM

## 2020-05-10 DIAGNOSIS — F419 Anxiety disorder, unspecified: Secondary | ICD-10-CM

## 2020-05-10 DIAGNOSIS — Z720 Tobacco use: Secondary | ICD-10-CM

## 2020-05-10 DIAGNOSIS — N400 Enlarged prostate without lower urinary tract symptoms: Secondary | ICD-10-CM

## 2020-05-10 DIAGNOSIS — E559 Vitamin D deficiency, unspecified: Secondary | ICD-10-CM

## 2020-05-10 DIAGNOSIS — E291 Testicular hypofunction: Secondary | ICD-10-CM

## 2020-05-10 DIAGNOSIS — M4800 Spinal stenosis, site unspecified: Secondary | ICD-10-CM

## 2020-05-10 DIAGNOSIS — E785 Hyperlipidemia, unspecified: Secondary | ICD-10-CM

## 2020-05-10 DIAGNOSIS — E119 Type 2 diabetes mellitus without complications: Secondary | ICD-10-CM

## 2020-05-10 LAB — CBC WITH AUTO DIFFERENTIAL
BKR ANION GAP: 13.1 g/dL (ref 12.0–18.0)
BKR BUN / CREAT RATIO: 234 x1000/??L (ref 140–440)
BKR WAM ABSOLUTE IMMATURE GRANULOCYTES: 0.2 x 1000/ÂµL (ref 0.0–0.3)
BKR WAM ABSOLUTE LYMPHOCYTE COUNT: 1.2 x 1000/??L (ref 1.0–4.0)
BKR WAM ABSOLUTE NRBC: 0 x 1000/ÂµL (ref 0.0–0.0)
BKR WAM ANALYZER ANC: 14 x 1000/??L — ABNORMAL HIGH (ref 1.0–11.0)
BKR WAM BASOPHIL ABSOLUTE COUNT: 0 x 1000/??L (ref 0.0–0.0)
BKR WAM BASOPHILS: 0.1 % (ref 0.0–4.0)
BKR WAM EOSINOPHIL ABSOLUTE COUNT: 0 x 1000/??L (ref 0.0–1.0)
BKR WAM EOSINOPHILS: 0 % (ref 0.0–7.0)
BKR WAM HEMATOCRIT: 39.9 % — ABNORMAL HIGH (ref 37.0–52.0)
BKR WAM HEMOGLOBIN: 13.1 g/dL (ref 12.0–18.0)
BKR WAM IMMATURE GRANULOCYTES: 1.1 % (ref 0.0–3.0)
BKR WAM LYMPHOCYTES: 7.5 % — ABNORMAL LOW (ref 8.0–49.0)
BKR WAM MCH (PG): 31.2 pg — ABNORMAL HIGH (ref 27.0–31.0)
BKR WAM MCHC: 32.8 g/dL — ABNORMAL HIGH (ref 31.0–36.0)
BKR WAM MCV: 95 fL — ABNORMAL HIGH (ref 78.0–94.0)
BKR WAM MONOCYTE ABSOLUTE COUNT: 0.5 x 1000/??L (ref 0.0–2.0)
BKR WAM MONOCYTES: 3.1 % — ABNORMAL LOW (ref 4.0–15.0)
BKR WAM MPV: 10.3 fL (ref 6.0–11.0)
BKR WAM NEUTROPHILS: 88.2 % — ABNORMAL HIGH (ref 37.0–84.0)
BKR WAM NUCLEATED RED BLOOD CELLS: 0 % (ref 0.0–1.0)
BKR WAM PLATELETS: 234 x1000/ÂµL (ref 140–440)
BKR WAM RDW-CV: 16.4 % — ABNORMAL HIGH (ref 11.5–14.5)
BKR WAM RED BLOOD CELL COUNT: 4.2 M/??L (ref 3.8–5.9)
BKR WAM WHITE BLOOD CELL COUNT: 15.9 x1000/??L — ABNORMAL HIGH (ref 4.0–10.0)

## 2020-05-10 LAB — BASIC METABOLIC PANEL
BKR BLOOD UREA NITROGEN: 31 mg/dL — ABNORMAL HIGH (ref 8–23)
BKR CALCIUM: 8.8 mg/dL — ABNORMAL HIGH (ref 8.8–10.2)
BKR CHLORIDE: 99 mmol/L (ref 98–107)
BKR CO2: 26 mmol/L (ref 20–30)
BKR CREATININE: 1.48 mg/dL — ABNORMAL HIGH (ref 0.40–1.30)
BKR EGFR (AFR AMER): 57 mL/min/{1.73_m2} (ref >60)
BKR EGFR (NON AFRICAN AMERICAN): 47 mL/min/{1.73_m2} (ref >60)
BKR GLUCOSE: 174 mg/dL — ABNORMAL HIGH (ref 70–100)
BKR POTASSIUM: 4.7 mmol/L (ref 3.3–5.1)
BKR SODIUM: 137 mmol/L (ref 136–144)

## 2020-05-10 LAB — NT-PROBNPE: BKR B-TYPE NATRIURETIC PEPTIDE, PRO (PROBNP): 1153 pg/mL — ABNORMAL HIGH (ref <125.0)

## 2020-05-10 MED ORDER — SODIUM CHLORIDE 0.9 % (FLUSH) INJECTION SYRINGE
0.9 % | Status: DC | PRN
Start: 2020-05-10 — End: 2020-05-13

## 2020-05-10 MED ORDER — FUROSEMIDE 10 MG/ML INJECTION SOLUTION
10 mg/mL | Freq: Once | INTRAVENOUS | Status: CP
Start: 2020-05-10 — End: ?
  Administered 2020-05-10: 17:00:00 10 mL via INTRAVENOUS

## 2020-05-10 MED ORDER — SENNOSIDES 8.6 MG TABLET
8.6 mg | Freq: Every day | ORAL | Status: DC
Start: 2020-05-10 — End: 2020-05-13
  Administered 2020-05-11 – 2020-05-12 (×2): 8.6 mg via ORAL

## 2020-05-10 MED ORDER — SODIUM CHLORIDE 0.9 % (FLUSH) INJECTION SYRINGE
0.9 % | Status: DC | PRN
Start: 2020-05-10 — End: 2020-05-13
  Administered 2020-05-10: 09:00:00 0.9 mL

## 2020-05-10 MED ORDER — CLOPIDOGREL 75 MG TABLET
75 mg | Freq: Every day | ORAL | Status: DC
Start: 2020-05-10 — End: 2020-05-13
  Administered 2020-05-10 – 2020-05-12 (×3): 75 mg via ORAL

## 2020-05-10 MED ORDER — ESCITALOPRAM 10 MG TABLET
10 mg | Freq: Every evening | ORAL | Status: DC
Start: 2020-05-10 — End: 2020-05-13
  Administered 2020-05-11 – 2020-05-12 (×2): 10 mg via ORAL

## 2020-05-10 MED ORDER — FUROSEMIDE 10 MG/ML INJECTION SOLUTION
10 mg/mL | Freq: Once | INTRAVENOUS | Status: CP
Start: 2020-05-10 — End: ?
  Administered 2020-05-10: 07:00:00 10 mL via INTRAVENOUS

## 2020-05-10 MED ORDER — NICOTINE 7 MG/24 HR DAILY TRANSDERMAL PATCH
7 mg | Freq: Every day | TRANSDERMAL | Status: DC
Start: 2020-05-10 — End: 2020-05-13

## 2020-05-10 MED ORDER — CARBOXYMETHYLCELLULOSE SODIUM 0.5 % EYE DROPS IN A DROPPERETTE
0.5 % | Freq: Three times a day (TID) | OPHTHALMIC | Status: AC | PRN
Start: 2020-05-10 — End: ?

## 2020-05-10 MED ORDER — SODIUM CHLORIDE 0.9 % (FLUSH) INJECTION SYRINGE
0.9 % | INTRAVENOUS | Status: DC | PRN
Start: 2020-05-10 — End: 2020-05-13

## 2020-05-10 MED ORDER — GABAPENTIN 300 MG CAPSULE
300 mg | Freq: Every evening | ORAL | Status: DC
Start: 2020-05-10 — End: 2020-05-13
  Administered 2020-05-11 – 2020-05-12 (×2): 300 mg via ORAL

## 2020-05-10 MED ORDER — ASPIRIN 81 MG TABLET,DELAYED RELEASE
81 mg | Freq: Every day | ORAL | Status: DC
Start: 2020-05-10 — End: 2020-05-13
  Administered 2020-05-10 – 2020-05-12 (×3): 81 mg via ORAL

## 2020-05-10 MED ORDER — HEPARIN, PORCINE (PF) 100 UNIT/ML IN 0.9% SODIUM CHLORIDE IV SYRINGE
100 unit/mL | Status: DC | PRN
Start: 2020-05-10 — End: 2020-05-13
  Administered 2020-05-12: 19:00:00 100 mL

## 2020-05-10 MED ORDER — ACETAMINOPHEN 650 MG/20.3 ML ORAL SUSPENSION
650 mg/20.3 mL | Freq: Four times a day (QID) | ORAL | Status: DC | PRN
Start: 2020-05-10 — End: 2020-05-13

## 2020-05-10 MED ORDER — ENOXAPARIN 40 MG/0.4 ML SUBCUTANEOUS SYRINGE
40 mg/0.4 mL | Freq: Two times a day (BID) | SUBCUTANEOUS | Status: DC
Start: 2020-05-10 — End: 2020-05-13
  Administered 2020-05-10 – 2020-05-12 (×5): 40 mg/0.4 mL via SUBCUTANEOUS

## 2020-05-10 MED ORDER — POLYETHYLENE GLYCOL 3350 17 GRAM ORAL POWDER PACKET
17 gram | Freq: Every day | ORAL | Status: DC
Start: 2020-05-10 — End: 2020-05-13
  Administered 2020-05-10 – 2020-05-12 (×3): 17 gram via ORAL

## 2020-05-10 MED ORDER — NICOTINE (POLACRILEX) 2 MG BUCCAL MINI LOZENGE
2 mg | ORAL | Status: DC | PRN
Start: 2020-05-10 — End: 2020-05-13
  Administered 2020-05-10 – 2020-05-11 (×2): 2 mg via ORAL

## 2020-05-10 MED ORDER — ROSUVASTATIN 40 MG TABLET
40 mg | Freq: Every day | ORAL | 7 refills | Status: DC
Start: 2020-05-10 — End: 2020-05-13
  Administered 2020-05-10 – 2020-05-12 (×3): 40 mg via ORAL

## 2020-05-10 MED ORDER — SODIUM CHLORIDE 0.9 % (FLUSH) INJECTION SYRINGE
0.9 % | Freq: Three times a day (TID) | Status: DC
Start: 2020-05-10 — End: 2020-05-10

## 2020-05-10 MED ORDER — ZZ IMS TEMPLATE
Freq: Every day | ORAL | Status: DC
Start: 2020-05-10 — End: 2020-05-13
  Administered 2020-05-10 – 2020-05-12 (×3): 1 mg via ORAL

## 2020-05-10 MED ORDER — SODIUM CHLORIDE 0.9 % (FLUSH) INJECTION SYRINGE
0.9 % | Freq: Three times a day (TID) | INTRAVENOUS | Status: DC
Start: 2020-05-10 — End: 2020-05-10

## 2020-05-10 NOTE — Progress Notes
Pt referred to [facility] for volume overload.  Pt arrived to clinic via wheelchair, accompanied by wife. A&O x4, very pleasant. VSS, no c/o pain. Pt reports mild SOB/DOE at baseline but recently pt unable to walk w/out significant SOB and needing to stop/rest. Pt also w/ drastic weight-gain w/in last 2 weeks (pt estimates ~30lbs). Plan for pt to be admitted per primary onc team for diuresis and cardiac w/u. Pt's port accessed and labs obtained. Covid swab collected. EKG performed. Pt awaiting admission bed.2100: Pt ambulated to BR independently, steady gait. Ambulated on RA and upon returning to room and sitting, pt w/ significant SOB. O2 sat 94-95% but pt started on 2L NC for comfort. Bed assigned on EP 7-5. 2130: Nursing report given to [facility] on 7-5. Transport requested. 2155: Pt transported to EP 7-5

## 2020-05-10 NOTE — Plan of Care
Problem: Adult Inpatient Plan of CareGoal: Plan of Care ReviewOutcome: Interventions implemented as appropriate Plan of Care Overview/ Patient Status    A+Ox4. VSS. NSR in the 60's-70's on tele. LS diminished bilaterally on 2L NCV, SOB on exertion. +B/Sx4, last B/M 05/09/20. Denies any pain or discomfort. +# edema to BLE. Able to ambulate independently, steady gate. Skin intact. Accessed right chest port. On prednisone taper. Bed set to lowest position, call bell with in reach.

## 2020-05-10 NOTE — Progress Notes
Cardio-oncology plan of care68 y.o.?M with  current tobacco use (1ppd now, 1-2ppd for 50 years, tried to quit multiple times), HTN, OSA on CPAP, history of CAD s/p MIx2 1996 (1 stent at [facility] area), 2014  (3 stents in [facility] hospital at [facility]) and PCI x4 (on review of [place] chest, likely RCA and LAD stents), HFrEF? LVEF 39% (OSH spect 09/2019), inferior wall hypokinesis with rega stress test 10/09/19 showing inferior infarct with recovery of LVEF? with recent TTE 01/2020 showing LVEF of 62%, moderate LVH, no WMA on 02/08/20, transfered care from [place] to [place] for his stage IV NSCLC started on Carbo/Taxol/Pembro 11/28/19 s/p 4 cycles, most recent 03/10/20 referred to cardio-onc to est care given hx of CAD and MI.  Pt with poor functional capacity, progressive DOE over the past year, even prior to chemotherapy, slightly different sx from prior MI. Euvolemic on exam in cardio onc outpt visit. 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 Pt underwent PET stress 05/06/20 that was high risk, w/+TID, 2 large reversible perfusion defects, LVEF of 40% at rest, drop to 36% on stress with [place] chest showing possible progression of disease. Plavix started cautiously without bolus due to recent hematuria complaints to see if he would tolerate DAPT. Today, pt f/u with oncology and noted 30lb weight gain (some could be due to steroids), no new O2 requirement at rest (no O2 done with exertion), but worsening SOB/DOE and troponin T now + 0.02. Plan to admit to medicine on telemetry with cards, onc on consult. For intermittent gross hematuria as an outpt, pt is being planned for outpt cystoscopy. 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 Workup and monitoring recommended as below:Get repeat ecgTrend serial troponin until peak, then troponin monitor dailyObtain BNP level, TSH, procalcitonin level, lipid panelICI has been on hold since July 2021 dose, continue holding for nowGet echo on admission to evaluate function/WMA/IVCRepeat cMRI while inpt once more euvolemic (able to do breath holds and lay on 1-2 pillows)DiureseDaily weights, strict Is and OsContinue asa and plavix, statin, vascepa, lisinoprilContinue current steroid dose (may consider pulse dose steroids should troponin go up)Monitor on telemetryConsult urology to see if they can do his cystoscopy while inpt Pt defers CABG even if there are findings of multi-vessel diseaseSmoking cessationOther workup per oncology ([place] scans etc)ReferencesICI use can increase risk of NSTEMIHttps://www.ahajournals.org/doi/full/10.1161/CIRCULATIONAHA.120.049981 ICI NotebookDistributors.[email or web address] Oretha Milch MD PhDCardio-oncology fellow  ?

## 2020-05-10 NOTE — Utilization Review (ED)
UM Status: Commercial - IP

## 2020-05-10 NOTE — Plan of Care
Plan of Care Overview/ Patient Status    0700-1900Pt is alert and oriented x 4. VSS. HTN in after 160's, MD Elmon Kirschner notified, denies HA, blurred vision. Tele, HR 80's, PVC's, team notified.  Afebrile. Denies SOB, RA, weaned from 2L O2. Last BM 9/14. Pt reported feeling constipated,  miralax given, pt declined senna. Voids spontaneously. I/O's monitored. Ambulates independently. Skin intact. BLE edema 2+ to 3+. Denies pain. Call bell within reach. Bed alarm on. 5:31 PM nicotine lozenge given. Problem: Adult Inpatient Plan of CareGoal: Plan of Care ReviewOutcome: Interventions implemented as appropriateGoal: Patient-Specific Goal (Individualized)Outcome: Interventions implemented as appropriateGoal: Absence of Hospital-Acquired Illness or InjuryOutcome: Interventions implemented as appropriateGoal: Optimal Comfort and WellbeingOutcome: Interventions implemented as appropriateGoal: Readiness for Transition of CareOutcome: Interventions implemented as appropriate Problem: Fall Injury RiskGoal: Absence of Fall and Fall-Related InjuryOutcome: Interventions implemented as appropriate

## 2020-05-10 NOTE — Progress Notes
Re: Paul Berger AstorinoMRN: [id number] DOB: 1952/06/23Referring Provider: Wolfgang Phoenix Provider: [facility] Provider Progress NoteChief Complaint: Patient [facility] with Chief Complaint Patient presents with ? Admission History of Present Illness/Oncology History:The patient is a 69 y.o. male who was diagnosed with metastatic NSCLC. He presents to the [facility] for admission for expedited cardio-oncology workup. Of note, patient was seen today for evaluation of worsening DOE. He is currently on treatment with carbo/taxol/pembro every 3 weeks. While in the [facility], he reports ongoing DOE which started in February but noticed worsening symptoms for the past 1-2 months. He continues to have bloody urine, startes this began 3 years ago. Case reviewed with admitting hospitalist. Treatment Plan Information Giddens, Overton Samberg   OP Pembrolizumab + Paclitaxel + Carboplatin (AUC 6) x 4 cycles, then Pembrolizumab Maintenance   Current Cycle Treatment Dates Line of Treatment Treatment Goal Treatment Plan Provider Treatment Department Status Auth Status  3 of 7 cycles 02/11/2020 to 09/08/2020 D. First Line Disease Control/Palliate Symptoms Elder Negus, MD [facility] Oncology Infusion at [address] Active   Created By Created On Updated By Updated On  Elder Negus, MD 01/20/2020  9:58 AM Acquanetta Belling 03/31/2020 10:19 AM  Assessment/Plan/OECC Course:# Shortness of breath # Hx myocarditis --elevated trop, negative once in ECC --EKG unremarkable --plan to admit with cardOnc consult in the morning, he will require repeat echo # Hematuria --Consult urology once admitted # Metastatic SCC--primary oncology care by Dr Layla Barter --Please see full note from today's visit with Dr Macarthur Critchley performance status at the time of the visit: 2Past Medical History: Diagnosis Date ? Anxiety ? CAD (coronary artery disease)  ? Diabetes mellitus (HC Code)  ? GERD (gastroesophageal reflux disease)  ? Hyperlipidemia  ? Hypertension  ? Hypertrophy of prostate without urinary obstruction and other lower urinary tract symptoms (LUTS)  ? Other testicular hypofunction  ? Spinal stenosis 09/28/2018 ? Tobacco abuse  ? Vitamin D deficiency  Past Surgical History: Procedure Laterality Date ? CAROTID STENT  08/28/2003 ? CHOLECYSTECTOMY  11/07/2016 ? KNEE SURGERY    torn meniscus ? LAMINECTOMY  10/07/2008 ? LUMBAR DISC SURGERY    herniated disc No Known AllergiesCurrent Outpatient Medications: ?  aspirin 81 MG EC tablet, Take 81 mg by mouth daily., Disp: , Rfl: ?  atorvastatin (LIPITOR) 80 mg tablet, Take 1 tablet (80 mg total) by mouth nightly., Disp: 30 tablet, Rfl: 6?  clopidogreL (PLAVIX) 75 mg tablet, Take 1 tablet (75 mg total) by mouth daily., Disp: 30 tablet, Rfl: 6?  escitalopram oxalate (LEXAPRO) 10 mg tablet, Take 1 tablet (10 mg total) by mouth nightly., Disp: 90 tablet, Rfl: 3?  fexofenadine (ALLEGRA) 180 mg tablet, Take 180 mg by mouth daily as needed., Disp: , Rfl: ?  furosemide (LASIX) 20 mg tablet, Take 2 tablets (40 mg total) by mouth daily., Disp: 60 tablet, Rfl: 0?  gabapentin (NEURONTIN) 300 mg capsule, Take 2 capsules (600 mg total) by mouth at bedtime., Disp: 60 capsule, Rfl: 0?  icosapent ethyL (VASCEPA) 1 gram capsule, Take 2 capsules (2 g total) by mouth 2 (two) times daily (0800, 1800)., Disp: 60 capsule, Rfl: 6?  lisinopriL (PRINIVIL,ZESTRIL) 2.5 mg tablet, Take 1 tablet (2.5 mg total) by mouth daily., Disp: 30 tablet, Rfl: 11?  multivitamin capsule, Take 1 capsule by mouth daily., Disp: , Rfl: ?  polyethylene glycol (GLYCOLAX; MIRALAX) 17 gram/dose powder, Take by mouth., Disp: , Rfl: ?  predniSONE (DELTASONE) 10 mg tablet, Take  13 tablets (130 mg total) by mouth daily for 7 days, THEN 12 tablets (120 mg total) daily for 7 days, THEN 11 tablets (110 mg total) daily for 7 days, THEN 10 tablets (100 mg total) daily for 7 days, THEN 9 tablets (90 mg total) daily for 7 days, THEN 8 tablets (80 mg total) daily for 7 days, THEN 7 tablets (70 mg total) daily for 7 days, THEN 6 tablets (60 mg total) daily for 7 days, THEN 5 tablets (50 mg total) daily for 7 days, THEN 4 tablets (40 mg total) daily for 7 days. Take with food.., Disp: 595 tablet, Rfl: 0?  [START ON 06/28/2020] predniSONE (DELTASONE) 10 mg tablet, Take 3 tablets (30 mg total) by mouth daily for 7 days, THEN 2 tablets (20 mg total) daily for 7 days, THEN 1 tablet (10 mg total) daily for 7 days, THEN 0.5 tablets (5 mg total) daily for 7 days, THEN 0.5 tablets (5 mg total) daily for 7 days. Take with food.., Disp: 49 tablet, Rfl: 0?  prochlorperazine (COMPAZINE) 10 mg tablet, Take 1 tablet (10 mg total) by mouth every 6 (six) hours as needed for nausea., Disp: 60 tablet, Rfl: 1?  tiotropium (SPIRIVA WITH HANDIHALER) 18 mcg capsule for inhalation, Inhale 1 capsule (18 mcg total) into the lungs daily., Disp: 30 capsule, Rfl: 2?  traMADoL (ULTRAM) 50 mg tablet, Take 1 tablet (50 mg total) by mouth every 6 (six) hours as needed., Disp: 15 tablet, Rfl: 0?  tumeric-ging-olive-oreg-capryl 100 mg-150 mg- 50 mg-150 mg Cap, Take by mouth., Disp: , Rfl: No current facility-administered medications for this encounter.Review of Systems Constitutional: Positive for fatigue. Negative for appetite change, chills, diaphoresis and fever. HENT:   Negative for mouth sores, sore throat and trouble swallowing.  Respiratory: Positive for shortness of breath. Negative for cough and wheezing.  Cardiovascular: Positive for leg swelling. Negative for chest pain. Gastrointestinal: Negative for abdominal distention, abdominal pain, blood in stool, constipation, diarrhea, nausea and vomiting. Genitourinary: Positive for hematuria. Negative for bladder incontinence, dysuria and frequency.  Musculoskeletal: Negative for arthralgias, back pain, gait problem and neck pain. Skin: Negative for itching, rash and wound. Neurological: Negative for dizziness, extremity weakness, gait problem, headaches, light-headedness and numbness. Hematological: Does not bruise/bleed easily. Psychiatric/Behavioral: Negative for confusion. The patient is not nervous/anxious.  Physical ExamConstitutional:     General: He is not in acute distress.   Appearance: He is not diaphoretic. HENT:    Head: Normocephalic and atraumatic. Eyes:    Conjunctiva/sclera: Conjunctivae normal.    Pupils: Pupils are equal, round, and reactive to light. Cardiovascular:    Rate and Rhythm: Normal rate and regular rhythm.    Heart sounds: No murmur heard. No friction rub. Pulmonary:    Effort: Pulmonary effort is normal. No respiratory distress.    Breath sounds: Normal breath sounds. No wheezing. Abdominal:    General: Bowel sounds are normal. There is no distension.    Palpations: Abdomen is soft. There is no mass.    Tenderness: There is no abdominal tenderness. There is no guarding. Musculoskeletal:       General: Normal range of motion.    Cervical back: Normal range of motion and neck supple.    Right lower leg: Edema present.    Left lower leg: Edema present. Skin:   General: Skin is warm and dry.    Coloration: Skin is not pale.    Findings: No erythema or rash. Neurological:    Mental Status: He is alert  and oriented to person, place, and time. Psychiatric:       Mood and Affect: Affect normal.       Judgment: Judgment normal. OECC Labs/Diagnostic Imaging/Procedures:Invalid input(s): <1>Complete Blood CountResults in Past 7 DaysResult Component Current Result Hematocrit 37.9 (05/09/2020) Hemoglobin 12.7 (05/09/2020) MCH 31.8 (H) (05/09/2020) MCHC 33.5 (05/09/2020) MCV 94.8 (H) (05/09/2020) MPV 9.8 (05/09/2020) Platelets 227 (05/09/2020) RBC 4.0 (05/09/2020) WBC 15.5 (H) (05/09/2020) Imaging impressions last 24 hours:05/09/2020 CXR: Multiple pulmonary metastases redemonstrated. There are superimposed bilateral mid and lower lung airspace opacities, concerning for multifocal pneumonia.Consults called: N/ADischarge Diagnosis:Cardiopulmonary Complications: Shortness of Breath NOSOECC Disposition:Admit-Inpatient floor Massie Kluver, APRN

## 2020-05-10 NOTE — Other
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  -  1 - Mobile Heartbeat message sent to [facility] at 11:25 AM. Received response at 1129.  Casimiro Needle Alven Alverio  05/10/2020  11:25 AM  Consult Connect [facility] [phone]

## 2020-05-10 NOTE — Progress Notes
Post Rounds Addendum68 y.o.?man with PMHx of NSCLC (currently on carbo/taxol/pembro q 3 weeks, currently on cycle 4), CAD (2 MIs, PCIx4 in 2014) and sleep apnea on CPAP who presents for acute on chronic SOB w/ LEE c/f first heart failure exacerbation 2/2 to ischemia vs pembrolizumab induced myocarditis. Agree with excellent H&P by Dr. Jamse Arn; physical exam as below.Physical Exam:Gen: pleasant gentleman in NADHEENT: due to O2 sat of 95%, NC removed while in room, EOMICV: rrr, no m/r/gPulm: nl WOB on RA, CTAB in anterior and posterior fieldsAbd: non-distended, soft, non-tender, compressibleExt: +1 pitting edemaNeuro: CN II-XII grossly intactSkin: no rashes; some scattered bruising on upper extremities Agree with current plan; only modification is additional dose of 40mg  IV lasix. Will ctm BMP during hospital stay iso diuresis. Plan to consult card/onc and coordinate imaging.- Gwynn Burly, MDInternal Medicine PGY-1

## 2020-05-10 NOTE — Telephone Encounter
Y ACCESS COMMUNICATION?	Referring provider/institution: [facility]?	Call back number for referring provider:?	Brief Clinical Summary:68 with PMH lung CA, CAD & HFrEF followed by cardio-oncology, presented to ECC with SOB and wt gain. Noted to be volume overloaded.  Admit for diuresis and serial troponins- see cardiology Dr. Army Melia note.?	Critical labs/vitals/imaging/exam findings:Hypertensive, on RABNP 1031Trop 0.02CXR to be ordered by Oncology?	Specific testing/consults/treatment recommended upon arrival:Cardiology-oncology consult?	Consults already called:Nikolette Reindl Yolande Jolly, MD

## 2020-05-11 ENCOUNTER — Inpatient Hospital Stay: Admit: 2020-05-11 | Payer: PRIVATE HEALTH INSURANCE

## 2020-05-11 ENCOUNTER — Telehealth
Admit: 2020-05-11 | Payer: PRIVATE HEALTH INSURANCE | Attending: Student in an Organized Health Care Education/Training Program

## 2020-05-11 DIAGNOSIS — R0600 Dyspnea, unspecified: Secondary | ICD-10-CM

## 2020-05-11 DIAGNOSIS — R0609 Other forms of dyspnea: Secondary | ICD-10-CM

## 2020-05-11 LAB — LIPID PANEL
BKR CHOLESTEROL/HDL RATIO: 3.6 (ref 0.0–5.0)
BKR CHOLESTEROL: 141 mg/dL
BKR HDL CHOLESTEROL: 39 mg/dL — ABNORMAL LOW (ref >=40)
BKR LDL CHOLESTEROL CALCULATED: 75 mg/dL
BKR TRIGLYCERIDES: 133 mg/dL

## 2020-05-11 LAB — BASIC METABOLIC PANEL
BKR ANION GAP: 11 (ref 7–17)
BKR BLOOD UREA NITROGEN: 39 mg/dL — ABNORMAL HIGH (ref 8–23)
BKR BUN / CREAT RATIO: 25.2 % — ABNORMAL HIGH (ref 8.0–23.0)
BKR CALCIUM: 8.6 mg/dL — ABNORMAL LOW (ref 8.8–10.2)
BKR CHLORIDE: 103 mmol/L (ref 98–107)
BKR CO2: 28 mmol/L (ref 20–30)
BKR CREATININE: 1.55 mg/dL — ABNORMAL HIGH (ref 0.40–1.30)
BKR EGFR (AFR AMER): 54 mL/min/{1.73_m2} (ref >60)
BKR EGFR (NON AFRICAN AMERICAN): 45 mL/min/1.73m2 (ref >60)
BKR GLUCOSE: 99 mg/dL (ref 70–100)
BKR POTASSIUM: 4.2 mmol/L (ref 3.3–5.1)
BKR SODIUM: 142 mmol/L (ref 136–144)

## 2020-05-11 LAB — CBC WITH AUTO DIFFERENTIAL
BKR WAM ABSOLUTE IMMATURE GRANULOCYTES: 0.2 x 1000/??L (ref 0.0–0.3)
BKR WAM ABSOLUTE LYMPHOCYTE COUNT: 2.6 x 1000/ÂµL (ref 1.0–4.0)
BKR WAM ABSOLUTE NRBC: 0 x 1000/??L (ref 0.0–0.0)
BKR WAM ANALYZER ANC: 10.5 x 1000/ÂµL (ref 1.0–11.0)
BKR WAM BASOPHIL ABSOLUTE COUNT: 0 x 1000/??L (ref 0.0–0.0)
BKR WAM BASOPHILS: 0.1 % (ref 0.0–4.0)
BKR WAM EOSINOPHIL ABSOLUTE COUNT: 0.1 x 1000/??L (ref 0.0–1.0)
BKR WAM EOSINOPHILS: 0.4 % (ref 0.0–7.0)
BKR WAM HEMATOCRIT: 39.1 % (ref 37.0–52.0)
BKR WAM HEMOGLOBIN: 13 g/dL (ref 12.0–18.0)
BKR WAM IMMATURE GRANULOCYTES: 1.1 % (ref 0.0–3.0)
BKR WAM LYMPHOCYTES: 18.1 % (ref 8.0–49.0)
BKR WAM MCH (PG): 31.3 pg — ABNORMAL HIGH (ref 27.0–31.0)
BKR WAM MCHC: 33.2 g/dL (ref 31.0–36.0)
BKR WAM MCV: 94 fL (ref 78.0–94.0)
BKR WAM MONOCYTE ABSOLUTE COUNT: 0.9 x 1000/??L (ref 0.0–2.0)
BKR WAM MONOCYTES: 6.6 % (ref 4.0–15.0)
BKR WAM MPV: 9.9 fL (ref 6.0–11.0)
BKR WAM NEUTROPHILS: 73.7 % (ref 37.0–84.0)
BKR WAM NUCLEATED RED BLOOD CELLS: 0 % (ref 0.0–1.0)
BKR WAM PLATELETS: 200 x1000/??L (ref 140–440)
BKR WAM RDW-CV: 16.2 % — ABNORMAL HIGH (ref 11.5–14.5)
BKR WAM RED BLOOD CELL COUNT: 4.2 M/??L (ref 3.8–5.9)
BKR WAM WHITE BLOOD CELL COUNT: 14.3 x1000/??L — ABNORMAL HIGH (ref 4.0–10.0)

## 2020-05-11 LAB — MAGNESIUM: BKR MAGNESIUM: 2.2 mg/dL (ref 1.7–2.4)

## 2020-05-11 LAB — PROCALCITONIN     (BH GH LMW Q YH): BKR PROCALCITONIN: <0.06 ng/mL

## 2020-05-11 LAB — TROPONIN T     (Q)
BKR TROPONIN T: 0.02 ng/mL — ABNORMAL HIGH (ref <0.01)
BKR TROPONIN T: 0.02 ng/mL — ABNORMAL HIGH (ref <0.01)

## 2020-05-11 LAB — TSH W/REFLEX TO FT4     (BH GH LMW Q YH): BKR THYROID STIMULATING HORMONE: 0.902 u[IU]/mL

## 2020-05-11 MED ORDER — PERFLUTREN LIPID MICROSPHERES 1.3 ML/10 ML NS INJ (IN
Freq: Once | INTRAVENOUS | Status: CP | PRN
Start: 2020-05-11 — End: ?
  Administered 2020-05-11: 15:00:00 10.000 mL via INTRAVENOUS

## 2020-05-11 MED ORDER — METOPROLOL TARTRATE 10 MG/ML ORAL SUSPENSION
10 mg/mL | Freq: Two times a day (BID) | ORAL | Status: DC
Start: 2020-05-11 — End: 2020-05-13
  Administered 2020-05-12 (×2): 10 mL via ORAL

## 2020-05-11 MED ORDER — FUROSEMIDE 40 MG TABLET
40 mg | Freq: Once | ORAL | Status: CP
Start: 2020-05-11 — End: ?
  Administered 2020-05-11: 13:00:00 40 mg via ORAL

## 2020-05-11 NOTE — Plan of Care
Plan of Care Overview/ Patient Status    CM Assessment and Discharge Planning    Most Recent Value Case Management Assessment Patient Requires Care Coordination Intervention Due To discharge planning needs/concerns, change in physical function, resumption of services prior to admission Arrived from prior to admission home Bed Hold  n/a Services Prior to Admission chemotherapy/radiation therapy, infusion therapy, outpatient Name of Infusion therapy, outpatient: hydration Name of chemotherapy/radiation therapy: Pembrolizumab + Paclitaxel + Carboplatin Type of Home Care Services None Equipment Currently Used at Home cane, straight, CPAP/BiPAP  [CPAP] Living Environment  Lives With Other (see comments) Current Living Arrangements home/apartment/condo Transportation Available car, family or friend will provide Home Safety Feels Safe Living In Home unable to assess Source of Clinical History Patient's clinical history has been reviewed and source of Information is: Medical record Completed Assessment Role: Discharge Planner CM/SW Attestation: Choose which ONE is appropriate for you I have reviewed the medical record and completed the above evaluation with the following recommendations. Yes Discharge Planning Coordination Recommendations Discharge Planning Coordination Recommendations Needs not determined at this time RN reviewed plan of care/ continuum of care need's with  Transitional care rounds, Interdisciplinary Team Discharge Planning Concerns to be Addressed care coordination/care conferences, discharge planning Referral(s) placed for: None    This is a 69 y.o. with PMHx of NSCLC (currently on carbo/taxol/pembro every 3 weeks, currently on cycle 4), CAD (2 Mis, 3 stents) and sleep apnea on CPAP who presents for acute HF, currently attributed to pembrolizumab-induced immunotoxicity.CM unable to reach patient by cell or room phone.  Per Epic patient appears to reside with a roommate.  No home care services found in EMR.  Patient appears active with infusion and oncology treatments.  DME: straight cane & CPAP.  On discharge family to transport home.CM will continue to follow pt towards discharge. Marvetta Gibbons, RNYale [facility] HospitalCare CoordinatorCell: [phone]: [phone]

## 2020-05-11 NOTE — Plan of Care
Plan of Care Overview/ Patient Status    0700-1900Pt is alert and oriented x 4. VSS. Tele, HR 80's, sinus rhythm. Afebrile. Denies SOB, RA. Last BM 9/14, miralax and senna given.  Voids spontaneously, light yellow. I/O's monitored. Ambulates independently. Skin intact. BLE edema  3+. Denies pain. Call bell within reach. Pt off unit for echo in morning. 1700: voided bloody urine, one small clot (<3mm) present. Team notified.Problem: Adult Inpatient Plan of CareGoal: Plan of Care ReviewOutcome: Interventions implemented as appropriateGoal: Patient-Specific Goal (Individualized)Outcome: Interventions implemented as appropriateGoal: Absence of Hospital-Acquired Illness or InjuryOutcome: Interventions implemented as appropriateGoal: Optimal Comfort and WellbeingOutcome: Interventions implemented as appropriateGoal: Readiness for Transition of CareOutcome: Interventions implemented as appropriate Problem: Fall Injury RiskGoal: Absence of Fall and Fall-Related InjuryOutcome: Interventions implemented as appropriate

## 2020-05-11 NOTE — Telephone Encounter
Patient needs:MD Visit: Within 7 - 10 days. In Person Visit / Must be Face to Face.  with Dr.DeVito for office cystoscopy. Patient still admitted and will not be able to make his appointment scheduled for tomorrow. Future Appointments     Provider Department Center  05/12/2020 1:30 PM Devito, Daralene Milch., MD [facility]rology at [address] [facility] CAD  05/17/2020 12:15 PM ASD [place] H; [facility] [place] PRE EXAM RM [facility] [place] Scan [facility] [place]  05/20/2020 9:00 AM Shank-Coviello, Shanda Bumps, APRN [facility] Onco-Oncology Program at [facility] M  05/20/2020 2:30 PM [facility] [place] G1 [facility] [place] Scan [facility] [place]  05/23/2020 9:00 AM Nikki Dom, MD [facility] Thoracic Oncology Program at [facility] at [address] [place] [facility] [place] M  05/27/2020 8:00 AM Huston, [place], MD [facility] CAD  06/08/2020 8:00 AM Weston Brass, DO [facility] Spine Center at [address] [facility] Rad  16/05/9603 9:00 AM [facility] MR MRC 2 [facility] MRI [facility] Rad

## 2020-05-11 NOTE — Care Coordination-Inpatient
TRANSITION CARE MANAGER NOTE: Chart reviewed; Patient has been identified as appropriate for the Transitional Care Program, CHF.Once discharged if patient remains appropriate a Transition Care Manager will make telephone contact to patient within 24-48 hours post discharge for the purpose of reviewing discharge instructions, medications, risk factors, red flags, and confirm follow-up MD appointments. If patient agrees to enroll in the Transitional Care Program the patient will periodically receive calls for 30 days post discharge to assess condition, discharge plan adherence  and to reinforce follow up. Dian Queen, RN, [phone]

## 2020-05-11 NOTE — Plan of Care
Problem: Adult Inpatient Plan of CareGoal: Plan of Care ReviewOutcome: Interventions implemented as appropriate Plan of Care Overview/ Patient Status     A+Ox4. VSS. NSR on Tele. LS diminished bilaterally, on RA. +B/Sx4. Voiding straw colored urine. Denies any pain or discomfort. Able to ambulate independently, steady gate, good safety awareness. Bed set to lowest position call bell with in reach.

## 2020-05-11 NOTE — Other
-    CONSULT  REQUEST  DOCUMENTATION  -  [facility] NOTE  -  Type of consult: [facility]   -  New Consult: [id number] Onalee Hua J Purnell /Location: 7627/7627-B / Brief Clinical Question: hx of hematuria, admitted for dyspnea, cardio onc requesting cystoscopy while inpatientReason for Consult? hematuria/Callback Cell Phone: covering [phone] / Please confirm receipt of this message by texting back ?OK?  -  2 - Smartweb Page sent to Urology [facility] Days 2343 at 9:20 AM.  -  Trudee Grip IV, PCT  05/11/2020  9:19 AM  Consult Connect [facility] [phone]

## 2020-05-11 NOTE — Progress Notes
[facility]    [facility]     Daily Progress Note    Attending Provider: Melene Muller., MD    Subjective:   Joeseph Amor:  Admitted to [facility]   Restarted lexipro  Declined cpap  40mg  IV lasix x2    Today:  - Dyspnea feels greatly improved from yesterday, swelling decreased with excellent UOP   - continue diuresis with 40mg  lasix  - cystoscopy deferred to outpatient  - TTE  - lasix 40mg  PO     Meds:   Scheduled Meds:  Current Facility-Administered Medications   Medication Dose Route Frequency Provider Last Rate Last Admin   ? aspirin EC delayed release tablet 81 mg  81 mg Oral Daily Nikolakopoulos, Ilias, MD   81 mg at 05/10/20 0835   ? clopidogreL (PLAVIX) tablet 75 mg  75 mg Oral Daily Nikolakopoulos, Ilias, MD   75 mg at 05/10/20 0836   ? enoxaparin (LOVENOX) syringe 40 mg  40 mg Subcutaneous Q12H Nikolakopoulos, Ilias, MD   40 mg at 05/10/20 2022   ? escitalopram oxalate (LEXAPRO) tablet 10 mg  10 mg Oral Nightly Josepha Pigg, MD   10 mg at 05/10/20 2314   ? gabapentin (NEURONTIN) capsule 600 mg  600 mg Oral Nightly Nikolakopoulos, Ilias, MD   600 mg at 05/10/20 2023   ? nicotine (NICODERM CQ) transdermal patch 24 hr 21 mg  21 mg Transdermal Daily Nikolakopoulos, Ilias, MD   21 mg at 05/10/20 0837   ? polyethylene glycol (MIRALAX) packet 17 g  17 g Oral Daily Laurena Bering, MD   17 g at 05/10/20 1727   ? predniSONE (DELTASONE) tablet 110 mg  110 mg Oral Daily Nikolakopoulos, Ilias, MD   110 mg at 05/10/20 0835   ? rosuvastatin (CRESTOR) tablet 40 mg  40 mg Oral Daily Nikolakopoulos, Ilias, MD   40 mg at 05/10/20 0836   ? senna (SENOKOT) tablet 8.6 mg  1 tablet Oral Daily Laurena Bering, MD         Continuous Infusions:  PRN Meds:acetaminophen, heparin PF in 0.9 % sodium chloride, nicotine (polacrilex), sodium chloride, sodium chloride, sodium chloride    Objective:     Vitals:  Temp:  [97.7 ?F (36.5 ?C)-98.5 ?F (36.9 ?C)] 98.5 ?F (36.9 ?C)  Pulse:  [65-89] 82  Resp:  [18-20] 20  BP: (135-165)/(80-90) 138/80  SpO2:  [95 %-97 %] 96 %  Device (Oxygen Therapy): room air  O2 Flow (L/min):  [1-2] 1 on  I/O's:    Intake/Output Summary (Last 24 hours) at 05/11/2020 0644  Last data filed at 05/11/2020 0000  Gross per 24 hour   Intake 460 ml   Output 4200 ml   Net -3740 ml        Physical Exam:  Gen: pleasant gentleman in NAD  HEENT: NCAT, EOMI  CV: rrr, no m/r/g  Pulm: nl WOB on RA, CTAB in anterior and posterior fields  Abd: mildly distended, soft, non-tender, compressible  Ext: +1 pitting edema  Neuro: CN II-XII grossly intact  Skin: no rashes; some scattered bruising on upper extremities     Labs:     Recent Labs   Lab 05/09/20  1116 05/10/20  0507 05/11/20  0447   WBC 15.5* 15.9* 14.3*   HGB 12.7 13.1 13.0   HCT 37.9 39.9 39.1   PLT 227 234 200    Recent Labs   Lab 05/09/20  1116 05/10/20  0507 05/11/20  0447   NEUTROPHILS 89.3* 88.2* 73.7      Recent Labs   Lab 05/09/20  1116 05/10/20  0507 05/11/20  0447   NA 136 137 142   K 4.8 4.7 4.2   CL 103 99 103   CO2 24 26 28    BUN 32* 31* 39*   CREATININE 1.36* 1.48* 1.55*   GLU 134* 174* 99   ANIONGAP 9 12 11     Recent Labs   Lab 05/09/20  1116 05/10/20  0507 05/11/20  0447   CALCIUM 8.5* 8.8 8.6*   MG 2.0  --  2.2      Recent Labs   Lab 05/09/20  1116   ALT 47   AST 16   ALKPHOS 52   BILITOT 0.3    No results for input(s): PTT, LABPROT, INR in the last 168 hours.     Micro:  9/14 COVID-19: Negative     Imaging & Diagnostics:  9/13 CXR: Multiple pulmonary metastases redemonstrated. There are superimposed bilateral mid and lower lung airspace opacities, concerning for multifocal pneumonia.    9/10 Cardiac Pet: * Highly abnormal PET myocardial perfusion study following pharmacologic vasodilation with regadenoson and at rest with high risk features.  Perfusion imaging was abnormal showing a large sized, moderate to severe intensity, reversible perfusion defect in the basal to apical inferior and inferolateral walls consistent with ischemia; and a medium to large sized, moderate intensity, reversible perfusion defect in the mid to apical anterior and anterolateral walls consistent with ischemia.  There is visual transient ischemic dilation  There was transient post-stress LV dysfunction, resting ejection fraction 40% and stress ejection fraction 36% with abnormal regional wall motion showing global hypokinesis and inferior akinesis.  The left ventricle was severely enlarged in size.  Stress electrocardiogram was abnormal due to ischemic ECG changes.  [place] performed for attenuation correction showed coronary artery calcification in the left main artery (moderate), left anterior descending artery (severe), diagonal artery (severe), left circumflex artery (mild), and right coronary artery (mild).    Non-cardiac findings of the [place] are described in a separate report from Radiology.  No prior study available for comparison.  * Global myocardial blood flow was reduced at 0.78 ml/g/min during stress and normal at 0.68 ml/g/min during rest.  Global myocardial flow reserve was reduced at 1.15.  * These findings highly suspicious for multi-vessel CAD    ECG/Tele Events:   9/13 NSR, old posterior infarct QTc 424    Assessment:   69 y.o. man with PMHx of NSCLC (currently on carbo/taxol/pembro q 3 weeks, currently on cycle 4), CAD (2 MIs, PCIx4 in 2014) and sleep apnea on CPAP who presents for acute on chronic SOB w/ LEE c/f first heart failure exacerbation vs pembrolizumab induced myocarditis.     Plan:     #AHF: cardiac PET 9/10 c/f for multi-vessel ischemic disease; also consider myocarditis 2/2 immune therapy   - diuresis (goal -2L); exceed goal, will transition to 40mg  PO lasix from IV  - cards onc following, appreciate recs  - troponin increased to 0.02 from 0.01, will continue to trend  - will try for repeat cardiac MR while inpatient  - c/w current steroid dose 110mg  daily per cards-onc  - c/w asa and plavix, statin  - cardiac cath deferred at present until hematuria evaluated  - on tele w/frequent PVCs  ?  #Hematuria: chronic, likely 2/2urolithiasis; cannot perform surveillance cystoscopy inpatient given equipment needs  - repeat cystoscopy scheduled to r/o malignancy;  will reschedule as outpatient  ?  #NSCLC  - Needs repeat scans, Dr. Layla Barter is following.  ?  #Chronic  #CAD  -c/w DAPT, statin  ?  #Tobacco use  -nicotine patch and lozenge   ?  #Carbo neuropathy  - c/w gabapentin  ?  FEN: Cardiac diet  PPx: lovenox sq  Lines/Tubes: piv, port  DISPO:  Floor pending diagnosis and management  CODE STATUS: Full Code   Primary Emergency ContactVerdis Frederickson, Home Phone: [phone]    Plan discussed with patient and Attending Physician.  Addendum to follow.     Signed:  Laurena Bering, MD  [facility], PGY-1  Reachable through [facility].    May 11, 2020    Hospitalist Medicine Attending Addendum:  Patient seen and examined, reviewed today's note by K. Elmon Kirschner, agree with exam findings and A/P as outlined/discussed.    Improved SOB, feels improved.     CTA B/L, no rrw  +1 edema B/L    Acute on Chronic Systolic CHF, CAD:  Persists. 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   -f/u TTE.  -Strict IOs, Wts.  -Switch to Lasix 40 mg po daily.   -Cont Prednisone 110 mg daily.   -Cont ASA, Plavix, Statin.   -Cardiology consulting.   Signed:  Ammie Dalton, MD   05/11/2020  10:22 AM

## 2020-05-12 ENCOUNTER — Ambulatory Visit: Admit: 2020-05-12 | Payer: PRIVATE HEALTH INSURANCE | Attending: Urology

## 2020-05-12 ENCOUNTER — Inpatient Hospital Stay: Admit: 2020-05-12 | Payer: PRIVATE HEALTH INSURANCE

## 2020-05-12 DIAGNOSIS — T451X5A Adverse effect of antineoplastic and immunosuppressive drugs, initial encounter: Secondary | ICD-10-CM

## 2020-05-12 DIAGNOSIS — G4730 Sleep apnea, unspecified: Secondary | ICD-10-CM

## 2020-05-12 DIAGNOSIS — R319 Hematuria, unspecified: Secondary | ICD-10-CM

## 2020-05-12 DIAGNOSIS — F17210 Nicotine dependence, cigarettes, uncomplicated: Secondary | ICD-10-CM

## 2020-05-12 DIAGNOSIS — I493 Ventricular premature depolarization: Secondary | ICD-10-CM

## 2020-05-12 DIAGNOSIS — Z79899 Other long term (current) drug therapy: Secondary | ICD-10-CM

## 2020-05-12 DIAGNOSIS — Z7982 Long term (current) use of aspirin: Secondary | ICD-10-CM

## 2020-05-12 DIAGNOSIS — I514 Myocarditis, unspecified: Secondary | ICD-10-CM

## 2020-05-12 DIAGNOSIS — Z7952 Long term (current) use of systemic steroids: Secondary | ICD-10-CM

## 2020-05-12 DIAGNOSIS — F419 Anxiety disorder, unspecified: Secondary | ICD-10-CM

## 2020-05-12 DIAGNOSIS — Z8673 Personal history of transient ischemic attack (TIA), and cerebral infarction without residual deficits: Secondary | ICD-10-CM

## 2020-05-12 DIAGNOSIS — Z9049 Acquired absence of other specified parts of digestive tract: Secondary | ICD-10-CM

## 2020-05-12 DIAGNOSIS — I252 Old myocardial infarction: Secondary | ICD-10-CM

## 2020-05-12 DIAGNOSIS — G629 Polyneuropathy, unspecified: Secondary | ICD-10-CM

## 2020-05-12 DIAGNOSIS — N400 Enlarged prostate without lower urinary tract symptoms: Secondary | ICD-10-CM

## 2020-05-12 DIAGNOSIS — E785 Hyperlipidemia, unspecified: Secondary | ICD-10-CM

## 2020-05-12 DIAGNOSIS — Z6841 Body Mass Index (BMI) 40.0 and over, adult: Secondary | ICD-10-CM

## 2020-05-12 DIAGNOSIS — E559 Vitamin D deficiency, unspecified: Secondary | ICD-10-CM

## 2020-05-12 DIAGNOSIS — R0600 Dyspnea, unspecified: Secondary | ICD-10-CM

## 2020-05-12 DIAGNOSIS — E669 Obesity, unspecified: Secondary | ICD-10-CM

## 2020-05-12 DIAGNOSIS — Z7902 Long term (current) use of antithrombotics/antiplatelets: Secondary | ICD-10-CM

## 2020-05-12 DIAGNOSIS — Z955 Presence of coronary angioplasty implant and graft: Secondary | ICD-10-CM

## 2020-05-12 DIAGNOSIS — I110 Hypertensive heart disease with heart failure: Secondary | ICD-10-CM

## 2020-05-12 DIAGNOSIS — Z20822 Contact with and (suspected) exposure to covid-19: Secondary | ICD-10-CM

## 2020-05-12 DIAGNOSIS — K219 Gastro-esophageal reflux disease without esophagitis: Secondary | ICD-10-CM

## 2020-05-12 DIAGNOSIS — C3490 Malignant neoplasm of unspecified part of unspecified bronchus or lung: Secondary | ICD-10-CM

## 2020-05-12 DIAGNOSIS — I5023 Acute on chronic systolic (congestive) heart failure: Secondary | ICD-10-CM

## 2020-05-12 DIAGNOSIS — I2510 Atherosclerotic heart disease of native coronary artery without angina pectoris: Secondary | ICD-10-CM

## 2020-05-12 DIAGNOSIS — R0609 Other forms of dyspnea: Secondary | ICD-10-CM

## 2020-05-12 LAB — CBC WITH AUTO DIFFERENTIAL
BKR CREATININE: 10.4 x 1000/??L — ABNORMAL HIGH (ref 1.0–11.0)
BKR SODIUM: 13.4 g/dL (ref 12.0–18.0)
BKR WAM ABSOLUTE IMMATURE GRANULOCYTES: 0.2 x 1000/ÂµL (ref 0.0–0.3)
BKR WAM ABSOLUTE LYMPHOCYTE COUNT: 2.4 x 1000/ÂµL (ref 1.0–4.0)
BKR WAM ANALYZER ANC: 10.4 x 1000/ÂµL (ref 1.0–11.0)
BKR WAM BASOPHIL ABSOLUTE COUNT: 0 x 1000/ÂµL (ref 0.0–0.0)
BKR WAM BASOPHILS: 0.2 % (ref 0.0–4.0)
BKR WAM EOSINOPHIL ABSOLUTE COUNT: 0 x 1000/??L (ref 0.0–1.0)
BKR WAM EOSINOPHILS: 0.3 % (ref 0.0–7.0)
BKR WAM HEMATOCRIT: 40.5 % (ref 37.0–52.0)
BKR WAM HEMOGLOBIN: 13.4 g/dL (ref 12.0–18.0)
BKR WAM IMMATURE GRANULOCYTES: 1.1 % (ref 0.0–3.0)
BKR WAM LYMPHOCYTES: 17.5 % — ABNORMAL HIGH (ref 8.0–49.0)
BKR WAM MCH (PG): 31.5 pg — ABNORMAL HIGH (ref 27.0–31.0)
BKR WAM MCHC: 33.1 g/dL (ref 31.0–36.0)
BKR WAM MCV: 95.1 fL — ABNORMAL HIGH (ref 78.0–94.0)
BKR WAM MONOCYTE ABSOLUTE COUNT: 0.7 x 1000/??L (ref 0.0–2.0)
BKR WAM MONOCYTES: 5 % (ref 4.0–15.0)
BKR WAM MPV: 9.9 fL (ref 6.0–11.0)
BKR WAM NEUTROPHILS: 75.9 % — ABNORMAL LOW (ref 37.0–84.0)
BKR WAM NUCLEATED RED BLOOD CELLS: 0 % (ref 0.0–1.0)
BKR WAM PLATELETS: 191 x1000/??L — ABNORMAL HIGH (ref 140–440)
BKR WAM RDW-CV: 16.1 % — ABNORMAL HIGH (ref 11.5–14.5)
BKR WAM RED BLOOD CELL COUNT: 4.3 M/??L (ref 3.8–5.9)
BKR WAM WHITE BLOOD CELL COUNT: 13.8 x1000/??L — ABNORMAL HIGH (ref 4.0–10.0)

## 2020-05-12 LAB — BASIC METABOLIC PANEL
BKR ANION GAP: 11 (ref 7–17)
BKR BLOOD UREA NITROGEN: 39 mg/dL — ABNORMAL HIGH (ref 8–23)
BKR BUN / CREAT RATIO: 25.8 — ABNORMAL HIGH (ref 8.0–23.0)
BKR CALCIUM: 8.5 mg/dL — ABNORMAL LOW (ref 8.8–10.2)
BKR CHLORIDE: 102 mmol/L — ABNORMAL HIGH (ref 98–107)
BKR CO2: 25 mmol/L (ref 20–30)
BKR EGFR (AFR AMER): 56 mL/min/1.73m2 (ref >60)
BKR EGFR (NON AFRICAN AMERICAN): 46 mL/min/{1.73_m2} (ref >60)
BKR GLUCOSE: 180 mg/dL — ABNORMAL HIGH (ref 70–100)
BKR POTASSIUM: 4.6 mmol/L (ref 3.3–5.1)

## 2020-05-12 LAB — TROPONIN T     (Q): BKR TROPONIN T: 0.01 ng/mL — ABNORMAL HIGH (ref <0.01)

## 2020-05-12 LAB — MAGNESIUM: BKR MAGNESIUM: 2.3 mg/dL (ref 1.7–2.4)

## 2020-05-12 LAB — COVID-19 CLEARANCE OR FOR PLACEMENT ONLY: BKR SARS-COV-2 RNA (COVID-19) (YH): NEGATIVE

## 2020-05-12 MED ORDER — FUROSEMIDE 40 MG TABLET
40 mg | ORAL_TABLET | Freq: Every day | ORAL | 3 refills | Status: SS
Start: 2020-05-12 — End: 2020-05-30

## 2020-05-12 MED ORDER — FUROSEMIDE 40 MG TABLET
40 mg | Freq: Every day | ORAL | Status: DC
Start: 2020-05-12 — End: 2020-05-13
  Administered 2020-05-12: 14:00:00 40 mg via ORAL

## 2020-05-12 MED ORDER — METOPROLOL SUCCINATE ER 25 MG TABLET,EXTENDED RELEASE 24 HR
25 mg | ORAL_TABLET | Freq: Every day | ORAL | 1 refills | Status: SS
Start: 2020-05-12 — End: 2020-06-23

## 2020-05-12 MED ORDER — GADOTERATE MEGLUMINE 0.5 MMOL/ML (376.9 MG/ML) INTRAVENOUS SOLUTION
0.5 mmol/mL (376.9 mg/mL) | Freq: Once | INTRAVENOUS | Status: CP | PRN
Start: 2020-05-12 — End: ?
  Administered 2020-05-12: 17:00:00 0.5 mL via INTRAVENOUS

## 2020-05-12 NOTE — Other
PHARMACY-ASSISTED MEDICATION REPORT    Pharmacist review of the best possible medication history obtained by the pharmacy medication history technician has been performed.      I have updated the home medication list and identified the following information that may be relevant to this admission.      NOTES/RECOMMENDATIONS   No recommendations at this time                        Prior to Admission Medications     Medication Name Sig Taking? Patient Reported     Discontinued: 03/25/2020 11:39 AM  Last dose:  --   Last Medication Note: >> Ernst Bowler   Tue May 10, 2020  2:38 PM  MedHx Tech(Keana Conyers, CPHT): FLAG FOR REMOVAL - patient wants off list, no longer taking    Entered by Ernst Bowler, CPHT Tue May 10, 2020 1438   Yes       aspirin 81 MG EC tablet  Last dose: 05/08/2020 at Unknown time Take 81 mg by mouth daily. Yes Yes       atorvastatin (LIPITOR) 80 mg tablet  Last dose: 05/08/2020 at Unknown time Take 1 tablet (80 mg total) by mouth nightly. Yes         carboxymethylcellulose (REFRESH PLUS) 0.5 % ophthalmic solution in dropperette  Last dose:  --  Place 1 drop into both eyes 3 (three) times daily as needed. Yes Yes       clopidogreL (PLAVIX) 75 mg tablet  Last dose: 05/09/2020 at Unknown time Take 1 tablet (75 mg total) by mouth daily. Yes         escitalopram oxalate (LEXAPRO) 10 mg tablet  Last dose:  --  Take 1 tablet (10 mg total) by mouth nightly. Yes         fexofenadine (ALLEGRA) 180 mg tablet  Last dose: Not Taking at Unknown time  Last Medication Note: >> Ernst Bowler   Tue May 10, 2020  2:38 PM  MedHx Tech(Keana Conyers, CPHT): FLAG FOR REMOVAL -  Patient started and stopped reported OTC/Vitamin on their own    Entered by Ernst Bowler, CPHT Tue May 10, 2020 1438 Take 180 mg by mouth daily as needed.  Patient not taking: Reported on 05/10/2020   Yes    **Flagged for Removal**     furosemide (LASIX) 20 mg tablet  Last dose:  --  Take 2 tablets (40 mg total) by mouth daily. Yes gabapentin (NEURONTIN) 300 mg capsule  Last dose: 05/08/2020 at Unknown time Take 2 capsules (600 mg total) by mouth at bedtime. Yes         icosapent ethyL (VASCEPA) 1 gram capsule  Last dose: Not Taking at Unknown time  Last Medication Note: >> Ernst Bowler   Tue May 10, 2020  2:46 PM  MedHx Tech(Keana Conyers, CPHT): FLAG FOR REMOVAL -  patient reports never taking    Entered by Ernst Bowler, CPHT Tue May 10, 2020 1446 Take 2 capsules (2 g total) by mouth 2 (two) times daily (0800, 1800).  Patient not taking: Reported on 05/10/2020           lisinopriL (PRINIVIL,ZESTRIL) 2.5 mg tablet  Last dose: Not Taking at Unknown time  Last Medication Note: >> Ernst Bowler   Tue May 10, 2020  2:40 PM  MedHx Tech (Keana [place], CPHT): patient reports never taking  Entered by Ernst Bowler, CPHT Tue May 10, 2020 1440 Take 1 tablet (2.5  mg total) by mouth daily.  Patient not taking: Reported on 05/10/2020           metoprolol succinate XL (TOPROL-XL) 25 mg 24 hr tablet  Last dose:  --  Take 0.5 tablets (12.5 mg total) by mouth daily. Take with or immediately following a meal.           multivitamin capsule  Last dose: Not Taking at Unknown time  Last Medication Note: >> Ernst Bowler   Tue May 10, 2020  2:40 PM  MedHx Tech(Keana Conyers, CPHT): FLAG FOR REMOVAL -  Patient started and stopped reported OTC/Vitamin on their own    Entered by Ernst Bowler, CPHT Tue May 10, 2020 1440 Take 1 capsule by mouth daily.  Patient not taking: Reported on 05/10/2020   Yes    **Flagged for Removal**     polyethylene glycol (GLYCOLAX; MIRALAX) 17 gram/dose powder  Last dose:  --   Last Medication Note: >> Ernst Bowler   Tue May 10, 2020  2:40 PM  MedHx Tech (Keana [place], [place]): patient reports prn use  Entered by Ernst Bowler, CPHT Tue May 10, 2020 1440 Take by mouth. Yes Yes       predniSONE (DELTASONE) 10 mg tablet  Last dose: 05/09/2020 at Unknown time Take 13 tablets (130 mg total) by mouth daily for 7 days, THEN 12 tablets (120 mg total) daily for 7 days, THEN 11 tablets (110 mg total) daily for 7 days, THEN 10 tablets (100 mg total) daily for 7 days, THEN 9 tablets (90 mg total) daily for 7 days, THEN 8 tablets (80 mg total) daily for 7 days, THEN 7 tablets (70 mg total) daily for 7 days, THEN 6 tablets (60 mg total) daily for 7 days, THEN 5 tablets (50 mg total) daily for 7 days, THEN 4 tablets (40 mg total) daily for 7 days. Take with food.. Yes         predniSONE (DELTASONE) 10 mg tablet  Last dose: 05/09/2020 at Unknown time Take 3 tablets (30 mg total) by mouth daily for 7 days, THEN 2 tablets (20 mg total) daily for 7 days, THEN 1 tablet (10 mg total) daily for 7 days, THEN 0.5 tablets (5 mg total) daily for 7 days, THEN 0.5 tablets (5 mg total) daily for 7 days. Take with food.. Yes         tiotropium (SPIRIVA WITH HANDIHALER) 18 mcg capsule for inhalation  Last dose:  --   Last Medication Note: >> Ernst Bowler   Tue May 10, 2020  2:45 PM  MedHx Tech(Keana Conyers, CPHT): FLAG FOR REMOVAL -  patient wants removed from list, has not taken this and will not be taking this in future.    Entered by Ernst Bowler, CPHT Tue May 10, 2020 1445 Inhale 1 capsule (18 mcg total) into the lungs daily.           tumeric-ging-olive-oreg-capryl 100 mg-150 mg- 50 mg-150 mg Cap  Last dose:  --   Last Medication Note: >> Ernst Bowler   Tue May 10, 2020  2:45 PM  MedHx Tech(Keana Conyers, CPHT): FLAG FOR REMOVAL -  patient reports never took    Entered by Ernst Bowler, CPHT Tue May 10, 2020 1445 Take by mouth.   Yes       Prior to admission medications last reviewed by Burlene Arnt on Tue May 10, 2020 1448         Thank you,  Wendie Simmer, PharmD  05/12/2020  2:50 PM  Phone: [facility]

## 2020-05-12 NOTE — Discharge Instructions
You were admitted to [facility] for shortness of breath and swelling in your legs.  We suspect this occurred either in relation to her chemotherapy medication or due to the less oxygen going to your heart.  While you were here, you initially required small amounts of oxygen through a nasal canula; we gave you medication to help your pee in order to decrease the fluid built up in your body and help you breath more easily. You also had an echocardiogram performed as well as a cardiac MRI; your cardiologist will further discuss these findings when you see them in the office. In regard to the blood in your urine, you have a follow-up appointment scheduled with urology to assess.When you go home, please make sure to take your weight at the same time everyday. Please continue to take the 40mg  of lasix everyday and continue a low salt diet.  If you notice increase in leg swelling or increase in your weight please contact your provider.  If you experience shortness of breath or chest pain please contact your provider and present to the emergency department for care. If notice increased blood or new clots in your urine, please contact your provider and do not hesitant to present to the Emergency Department.

## 2020-05-12 NOTE — Plan of Care
Problem: Adult Inpatient Plan of CareGoal: Plan of Care ReviewOutcome: Interventions implemented as appropriate Problem: Adult Inpatient Plan of CareGoal: Optimal Comfort and WellbeingOutcome: Interventions implemented as appropriate Problem: Fall Injury RiskGoal: Absence of Fall and Fall-Related InjuryOutcome: Interventions implemented as appropriate Plan of Care Overview/ Patient Status    1900-0700Pt A&Ox4, glasses at bedside. Pt denies pain, denies SOB. Refused CPAP this shift. On RA, ox sats WNL. Tele monitored in NSR, HTN noted, WCTM. On Cardiac diet, noted to be NPO at midnight. Continent of B&B, urinal in bathroom, output documented and noted to be bright red, provider notified and aware. LBM 9/16. Edema noted to legs bilaterally. R SL chest port c/d/i.  T&R in and OOB independently. BA refused, fall and safety precautions discussed. Call bell placed within reach, frequent monitoring performed. WCTM.[05:50] Pt reported having two small bowls of grape nuts with milk around 2am. Provider notified and aware. WCTM.Electronically Signed by Barbarann Ehlers, RN, May 12, 2020 8:47 AM

## 2020-05-12 NOTE — Discharge Summary
[facility]    Med/Surg Discharge Summary    Patient Data:    Patient Name: Paul Hunter Admit date: 05/09/2020   Age: 69 y.o. Discharge date: 05/12/2020   DOB: 07/31/1951  Discharge Attending Physician: Melene Muller., MD    MRN: [id number]  Discharged Condition: good   PCP: Loura Back, PA Disposition: Home      Principal Diagnosis: acute decompensated heart failure  Secondary Diagnoses: hematuria      Issues to be Addressed Post Discharge:     Issues to be Addressed Post Discharge:  1. Cardio-oncology follow-up with discussion of cardiac MRI results and if patient needs a heart cath  2. Outpatient cystoscopy with urology (urology to help coordinate with Dr. Meryl Dare)  3. Dual anti-platelet therapy pending cystoscopy results  4. Diuretic dosing for heart failure    Relevant Medications on Discharge:  Other: No new medications, continue Lasix 40mg     .    Pending Labs and Tests:       Follow-up Information:  ENROLLED IN TRANSITIONAL CARE PROGRAM          Lehman Prom MD.  [address]   [address]  [place], [place].  [phone]   On 05/26/2020  Primary Care follow up appointment is scheduled for 10:00am    Devito, Daralene Milch., MD  [address]  [address]  [place] [address]  [phone]      Urology office will contact the patient directly to schedule a follow up appointment upon discharge.    Arvil Persons, APRN  [address]  [address]  [place] [address]  [phone]    Go on 05/20/2020  9AM       Future Appointments   Date Time Provider Department Center   05/17/2020 12:15 PM YTH [place] PRE EXAM RM YTH [place] ASD [place]   05/20/2020  9:00 AM Shank-Coviello, Shanda Bumps, APRN SMIL CARD ON [facility] [place] M   05/20/2020  2:30 PM ASD [place] G1 YTS  [facility] [place]   05/23/2020  9:00 AM Nikki Dom, MD SM THOR NOHA [facility] [place] M   05/26/2020 10:00 AM Nikki Dom, MD SM THOR NOHA [facility] [place] M   05/27/2020  8:00 AM Abe People, MD [facility] DEVN YM CAD   06/08/2020  8:00 AM Weston Brass, DO SLW [facility] Rad   06/23/2020  9:00 AM Y[facility] MR MRC 2 [facility] MRI [place] [facility] Rad       Hospital Course:     Hospital Course:   [name] is a 69 y.o. with PMHx of NSCLC (currently on carbo/taxol/pembro every 3 weeks, currently on cycle 4), CAD (2 Mis, 3 stents) and sleep apnea on CPAP who presents for acute on chronic dyspnea.     He started having SOB on December 2020 (able to walk >2 blocks without SOB). Two months prior to presentation, SOB progressed.  ?  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 ?Trop and BNP normal.?Started on prednisone 130mg  daily and has been tapered (now on 110 until 9/15). ?PET stress with findings suspicious for multi-vessel CAD, resting EF 40%-->stress 36%. ?Therefore, was started on plavix per cardio-onc.  ?  Of note patient mentioned recently hematuria when started on Plavix. Says that hematuria has been going on intermittently for three years and that he was at some point diagnosed with  urinary stones as well.  ?  In the ECC:  Evidence of worsening CHF syndrome with weight gain (30 lbs in 10 days), significant acute LE pitting edema, and worsening dyspnea with exertion. ?Labs sent today show troponin 0.02 with ntproBNP 1031 up from 317 on 8/25. Per cardio-onc and primary oncologist Dr. Layla Barter, decided for hospitalization and workup of his acute heart failure.    Hospital Course by Issue:    #Acute decompensated heart failure - thought to be multifactorial in the setting of myocarditis from [facility] and possibly related to ischemia. 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 Patient also given IV Lasix for diuresis with good effect and ultimately discharged on Lasix 40mg  PO, net negative 7L since admission. He was able to be weaned off oxygen. The cardiac MRI was ordered while patient was an inpatient and more euvolemic. He had a TTE while inpatient which showed likely a diastolic dysfunction exacerbation, with no drop in LVEF and previously present wall motion abnormalities. He was started on metoprolol 6.25mg  BID and discharged on metop succinate 12.5 mg daily for ectopy. Cardiology oncology follow up was scheduled for him.     #Hematuria - Patient reported hematuria intermittently for three years and was scheduled for outpatient cystoscopy. Urology was contacted and was to make arrangements for outpatient cystoscopy, the week following discharge.       Inpatient Consultants and summary of recommendations:  Urology (as above)  Cardio-oncology (as above)    Pertinent Procedures or Surgeries:     None    Pertinent lab findings and test results:   Objective:     Recent Labs   Lab 05/10/20  0507 05/11/20  0447 05/12/20  0446   WBC 15.9* 14.3* 13.8*   HGB 13.1 13.0 13.4   HCT 39.9 39.1 40.5   PLT 234 200 191    Recent Labs   Lab 05/10/20  0507 05/11/20  0447 05/12/20  0446   NEUTROPHILS 88.2* 73.7 75.9      Recent Labs   Lab 05/10/20  0507 05/11/20  0447 05/12/20  0446   NA 137 142 138   K 4.7 4.2 4.6   CL 99 103 102   CO2 26 28 25    BUN 31* 39* 39*   CREATININE 1.48* 1.55* 1.51*   GLU 174* 99 180*   ANIONGAP 12 11 11     Recent Labs   Lab 05/10/20  0507 05/11/20  0447 05/12/20  0446   CALCIUM 8.8 8.6* 8.5*   MG  --  2.2 2.3      Recent Labs   Lab 05/09/20  1116   ALT 47   AST 16   ALKPHOS 52   BILITOT 0.3    No results for input(s): PTT, LABPROT, INR in the last 168 hours.     Culture Information:  No results for input(s): LABBLOO, LABURIN, LOWERRESPIRA in the last 168 hours.    Imaging:   Imaging results last 1 week:  XR Chest PA or AP    Result Date: 05/09/2020  Multiple pulmonary metastases redemonstrated. There are superimposed bilateral mid and lower lung airspace opacities, concerning for multifocal pneumonia. Reported And Signed By: Cori Razor, MD    PET/[place] Stress [place] Read    Result Date: 05/07/2020  Non-diagnostic [place] with limited chest coverage performed for attenuation correction. 1. No acute abnormality. 2. On limited comparison, findings concerning for progression of pulmonary metastatic disease from 03/26/2020 (consider repeated nonurgent diagnostic chest ). 3.  Other findings as above. A critical alert Yellow message has been communicated to this patient's provider via the Nuance Actionable Findings application on 05/07/2020 10:49 PM, Message ID [id number]. Reported And Signed By: Heidi Dach, MD      Diet:  Regular diet  Mobility: Highest Level of mobility - ACTUAL: Mobility Level 8, Walk 250+ feet AM PAC 24              Physical Exam     Discharge vitals:   Temp:  [97.5 ?F (36.4 ?C)-98.2 ?F (36.8 ?C)] 97.5 ?F (36.4 ?C)  Pulse:  [67-86] 72  Resp:  [16-19] 19  BP: (123-147)/(68-87) 147/87  SpO2:  [94 %-96 %] 96 %  Device (Oxygen Therapy): room air   Pertinent Findings of Physical Exam: Unremarkable  Cognitive Status at Discharge: Baseline    Discharge Physical Exam:  Physical Exam    Gen: pleasant gentleman in NAD  HEENT: NCAT, EOMI  CV: rrr, no m/r/g  Pulm: nl WOB on RA, CTAB in anterior and posterior fields  Abd: mildly distended, soft, non-tender, compressible  Ext: +1 pitting edema  Neuro: CN II-XII grossly intact  Skin: no rashes; some scattered bruising on upper extremities     Allergies   No Known Allergies     PMH PSH   Past Medical History:   Diagnosis Date   ? Anxiety    ? CAD (coronary artery disease)    ? Diabetes mellitus (HC Code)    ? GERD (gastroesophageal reflux disease)    ? Hyperlipidemia    ? Hypertension    ? Hypertrophy of prostate without urinary obstruction and other lower urinary tract symptoms (LUTS)    ? Other testicular hypofunction    ? Spinal stenosis 09/28/2018   ? Tobacco abuse    ? Vitamin D deficiency       Past Surgical History:   Procedure Laterality Date   ? CAROTID STENT  08/28/2003   ? CHOLECYSTECTOMY  11/07/2016   ? KNEE SURGERY      torn meniscus   ? LAMINECTOMY  10/07/2008   ? LUMBAR DISC SURGERY      herniated disc        Social History Family History   Social History     Tobacco Use   ? Smoking status: Current Every Day Smoker     Types: Cigarettes   Substance Use Topics   ? Alcohol use: Not on file      History reviewed. No pertinent family history.       Discharge Medications:   Discharge:   Current Discharge Medication List      START taking these medications    Details   metoprolol succinate XL (TOPROL-XL) 25 mg 24 hr tablet Take 0.5 tablets (12.5 mg total) by mouth daily. Take with or immediately following a meal.  Qty: 30 tablet, Refills: 0  Start date: 05/12/2020         CONTINUE these medications which have NOT CHANGED    Details   aspirin 81 MG EC tablet Take 81 mg by mouth daily.      atorvastatin (LIPITOR) 80 mg tablet Take 1 tablet (80 mg total) by mouth nightly.  Qty: 30 tablet, Refills: 6      carboxymethylcellulose (REFRESH PLUS) 0.5 % ophthalmic solution in dropperette Place 1 drop into both eyes 3 (three) times daily as needed.      clopidogreL (PLAVIX) 75 mg tablet Take 1 tablet (75 mg total) by mouth daily.  Qty: 30 tablet, Refills: 6      escitalopram oxalate (LEXAPRO) 10 mg tablet Take 1 tablet (10 mg total) by mouth nightly.  Qty: 90 tablet, Refills: 3      furosemide (LASIX) 20 mg tablet Take 2 tablets (40 mg total) by mouth daily.  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  Qty: 60 capsule, Refills: 0    Associated Diagnoses: Spinal stenosis, unspecified spinal region      polyethylene glycol (GLYCOLAX; MIRALAX) 17 gram/dose powder Take by mouth.      !! predniSONE (DELTASONE) 10 mg tablet Take 13 tablets (130 mg total) by mouth daily for 7 days, THEN 12 tablets (120 mg total) daily for 7 days, THEN 11 tablets (110 mg total) daily for 7 days, THEN 10 tablets (100 mg total) daily for 7 days, THEN 9 tablets (90 mg total) daily for 7 days, THEN 8 tablets (80 mg total) daily for 7 days, THEN 7 tablets (70 mg total) daily for 7 days, THEN 6 tablets (60 mg total) daily for 7 days, THEN 5 tablets (50 mg total) daily for 7 days, THEN 4 tablets (40 mg total) daily for 7 days. Take with food.Rob Bunting: 595 tablet, Refills: 0      !! predniSONE (DELTASONE) 10 mg tablet Take 3 tablets (30 mg total) by mouth daily for 7 days, THEN 2 tablets (20 mg total) daily for 7 days, THEN 1 tablet (10 mg total) daily for 7 days, THEN 0.5 tablets (5 mg total) daily for 7 days, THEN 0.5 tablets (5 mg total) daily for 7 days. Take with food.Rob Bunting: 49 tablet, Refills: 0  Start date: 06/28/2020, End date: 08/02/2020      fexofenadine (ALLEGRA) 180 mg tablet Take 180 mg by mouth daily as needed.      icosapent ethyL (VASCEPA) 1 gram capsule Take 2 capsules (2 g total) by mouth 2 (two) times daily (0800, 1800).  Qty: 60 capsule, Refills: 6      lisinopriL (PRINIVIL,ZESTRIL) 2.5 mg tablet Take 1 tablet (2.5 mg total) by mouth daily.  Qty: 30 tablet, Refills: 11      multivitamin capsule Take 1 capsule by mouth daily.      tiotropium (SPIRIVA WITH HANDIHALER) 18 mcg capsule for inhalation Inhale 1 capsule (18 mcg total) into the lungs daily.  Qty: 30 capsule, Refills: 2      tumeric-ging-olive-oreg-capryl 100 mg-150 mg- 50 mg-150 mg Cap Take by mouth.       !! - Potential duplicate medications found. Please discuss with provider.      STOP taking these medications       albuterol sulfate 90 mcg/actuation HFA aerosol inhaler        prochlorperazine (COMPAZINE) 10 mg tablet        traMADoL (ULTRAM) 50 mg tablet              There was at total of approximately 60 minutes spent on the discharge of this patient    Electronically Signed:  Novella Olive, MD   05/12/2020   12:37 PM  Best Contact Information: [facility]  Hospitalist Attending Addendum:   Pt seen and examined.  Discharge documentation reviewed, discharge Home today, Prescriptions, and W10 completed.    Improved SOB overall.  ?  CTA B/L, no rrw  +1 Trace edema B/L  ?  Acute on Chronic Systolic CHF, CAD:  Persists. 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   -Strict IOs, Wts.  -Cont 40 mg po daily.   -Cont Prednisone 110 mg daily.   -Cont ASA, Plavix, Statin.   -f/u with Cardiology.  Discharge x 40 minutes.  Electronic Signature:  Ammie Dalton, MD   05/12/2020  2:12 PM

## 2020-05-12 NOTE — Care Coordination-Inpatient
I identified my role as CM or TC from Care Management department .      Medicare Important Message explained to patient over the phone.      Patient stated understanding the notice and was in agreement with the discharge plan.      Patient advised of the right to call the [employer] [facility]) at [phone] if not in agreement with the discharge from the hospital.      Copy mailed to patient after confirming address.    Patient has a ride picking him up at 4 pm. Pt med ready to d/c he is awaiting lunch before leaving.    Paul Hunter [facility]    Transition Coordinator  [address] [facility]  Ep-7-5 and Np-10  [phone]) [phone]  (M) :[phone]  (F) [phone]

## 2020-05-12 NOTE — Care Coordination-Inpatient
CM Discharge Planning    Most Recent Value Discharge Planning Concerns to be Addressed no discharge needs identified Referral(s) placed for: None Patient goals/treatment preference for discharge are:  home with MD follow up Home Health Care Services Required N/A Patient is considered homebound due to: (he/she requires considerable and taxing effort to leave their residence for medical reasons or religious services OR infrequently OR of short duration for other reasons) n/a Equipment Needed After Discharge none Mode of Transportation  Private car  (add comment for special considerations) Patient to be accompanied by family member Is the patient discharging to their home address Yes CM D/C Readiness PASRR completed and approved N/A Authorization number obtained, if required N/A Is there a 3 day INPATIENT Qualifying stay for Medicare Patients? N/A Medicare IM- signed, dated, timed and scanned, if required Yes DME Authorized/Delivered N/A No needs identified/ follow up with PCP/MD Yes Post acute care services secured W10 complete N/A Pri Completed and Accepted  N/A Finalized Plan Expected Discharge Date 05/12/20 Discharge Disposition Home or Self Care Post acute care services secured W10 complete N/A Physician documentation required AVS (Patient Instructions), Prescriptions

## 2020-05-12 NOTE — Telephone Encounter
[facility] called to schedule a 7 day cystoscopy appointment with Dr. Meryl Dare. [facility] would like the secretary to contact patient to schedule. Patient is being discharged today. Thank you.

## 2020-05-12 NOTE — Plan of Care
Cardio-onc plan of care f/u note    Pt endorses 1 week of weight gain, worsening DOE that started on 9/5, then noted LE edema on 9/19.  No chest pain, +orthopnea +LE edema +DOE, no palpitations, LH  Still smoking, doesn't think he would be able to quit      Pt has been weaned off O2, net neg 6L today, lost ~15lbs (but one is bed weight and today is standing) but likely close given 1L = 2.2Lbs  Pt feels much better in terms of his resp status, can ambulate the hallways without SOB/DOE/CP  Echo done this AM      SBP 130-160s  HR50-80s  o2 96-97 on RA    Tele shows sinus rhythm, frequent, polymorphic PVCs and NSVT  Trop T 0.02 x2 9/15  BNP elevated  154 8/13 to 1120 9/13  procal neg  Creatinine 1.4-1.5  LDL 75, TG 133  HDL 39  TSH wnl    ECG 05/11/20 NSR, on ecg, normal qtc, q in III avf    Plan for cMRI tomorrow    Vitals:    05/10/20 2020 05/11/20 0805 05/11/20 0900 05/11/20 1200   BP: 138/80 139/73     Pulse: 82 (!) 49 81    Resp: 20 18     Temp: 98.5 ?F (36.9 ?C) 97.9 ?F (36.6 ?C)     TempSrc: Oral Oral     SpO2: 96% 97%     Weight:    (!) 139.1 kg   Height:         BMI Readings from Last 3 Encounters:   05/11/20 41.59 kg/m?   05/09/20 43.77 kg/m?   04/29/20 39.33 kg/m?     Wt:   05/11/20 (!) 139.1 kg   05/09/20 (!) 146.4 kg   04/29/20 131.5 kg   04/14/20 132.2 kg   04/08/20 132.4 kg   03/31/20 134.3 kg     On exam:   Occ extra beats on exam  Lungs with distant lung sounds  Minimal crackles in mid lung bases, +HJR and JVP to 9cm  pt still has 2+ pitting edema R>L    TTE 05/11/20   * Normal left ventricular size, systolic function and wall motion. Moderate concentric left ventricular hypertrophy.  LVEF calculated by biplane Simpson's was 68%.  Abnormal tissue Doppler suggestive of abnormal diastolic function.  The inferior and inferoseptal wall are hypokinetic  * Normal right ventricular cavity size, systolic function and wall motion.  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  Previous study ascending aorta size was 4.4cm  * Inferior vena cava was not well visualized.  * Compared with the prior study, dated 02/08/2020, there are changes noted.  Wall motion in inferior and inferoseptal walls noted on current study, though compared to prior image, no significant change in wall motion abnormality.    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 Pt w/frequent PVCs on tele. Workup and monitoring recommended as below:      NYHA class III --> II, stage C, likely diastolic dysfunction exacerbation given no drop in LVEF, wall motion abnormalities were previously present.  Get cMRI before d/c to see if we need to do pulse dose steroids  Daily ecg, troponin  Continue to diurese, aim for net neg 2L  Transition to PO diuretics once close to euvolemic to maintain euvolemia  Continue asa and plavix, statin, vascepa, lisinopril 2.5mg   Start metoprolol 6.25 bid, can convert to metop succ 12.5 daily on d/c  Get BNP on discharge   Won't be able to do his cystoscopy while inpt, will aim to d/c on Fri and get cystoscopy early next week  F/u with transitional care for HF  F/u with cardio onc in 05/20/20  K>4, Mg>2  outpt holter monitor  F/u with cardio-onc on 9/24

## 2020-05-13 ENCOUNTER — Encounter: Admit: 2020-05-13 | Payer: PRIVATE HEALTH INSURANCE

## 2020-05-13 NOTE — Progress Notes
Transition Care Manager post discharge follow up call Brief Summary:Hospital Course: Paul Hunter is a 69 y.o.?with PMHx of NSCLC (currently on carbo/taxol/pembro every 3 weeks, currently on cycle 4), CAD (2 Mis, 3 stents) and sleep apnea on CPAP who presents for acute on chronic dyspnea. 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 ?Trop and BNP normal.?Started on prednisone 130mg  daily and has been tapered (now on 110 until 9/15). ?PET stress with findings suspicious for multi-vessel CAD, resting EF 40%-->stress 36%. ?Therefore, was started on plavix per cardio-onc.?Of note patient mentioned recently hematuria when started on Plavix. Says that hematuria has been going on intermittently for three years and that he was at some point diagnosed with urinary stones as well.?In the [id number] of worsening CHF syndrome with weight gain (30 lbs in 10 days), significant acute LE pitting edema, and worsening dyspnea with exertion. ?Labs sent today show troponin 0.02 with ntproBNP 1031 up from 317 on 8/25. Per cardio-onc and primary oncologist Paul. Layla Hunter, decided for hospitalization and workup of his acute heart failure.?Hospital Course by Issue:?#Acute decompensated heart failure - thought to be multifactorial in the setting of myocarditis from [facility] and possibly related to ischemia. 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 Patient also given IV Lasix for diuresis with good effect and ultimately discharged on Lasix 40mg  PO, net negative 7L since admission. He was able to be weaned off oxygen. The cardiac MRI was ordered while patient was an inpatient and more euvolemic. He had a TTE while inpatient which showed likely a diastolic dysfunction exacerbation, with no drop in LVEF and previously present wall motion abnormalities. He was started on metoprolol 6.25mg  BID and discharged on metop succinate 12.5 mg daily for ectopy. Cardiology oncology follow up was scheduled for him. ?#Hematuria - Patient reported hematuria intermittently for three years and was scheduled for outpatient cystoscopy. Urology was contacted and was to make arrangements for outpatient cystoscopy, the week following discharge. ?Call made JW:[id number] Hospital: [facility] Diagnosis: (or reason for hospitalization):Hospital Admission Date: 09/13/21Hospital Discharge Date: 09/16/21Disposition: homeAdditional Services on D/C?  NO        DME: NA      Have you experienced any new symptoms or have your previous symptoms worsened? NoIs the patient able to identify symptoms to report to MD? Yes Did you receive new prescriptions & have you picked them up and started?   YESPt was discharged on metoprolol 12.5mg  po qd , Medications reviewed and changes noted. Yes  Prednisone taper discussed, Pt was on prednisone. As noted in MD discharged summary Started on prednisone 130mg  daily and has been tapered (now on 110 until 9/15). ?  Pt is currently on 100mg . predniSONE (DELTASONE) 10 mg tablet Take 13 tablets (130 mg total) by mouth daily for 7 days, THEN 12 tablets (120 mg total) daily for 7 days, THEN 11 tablets (110 mg total) daily for 7 days, THEN 10 tablets (100 mg total) daily for 7 days, THEN 9 tablets (90 mg total) daily for 7 days, THEN 8 tablets (80 mg total) daily for 7 days, THEN 7 tablets (70 mg total) daily for 7 days, THEN 6 tablets (60 mg total) daily for 7 days, THEN 5 tablets (50 mg total) daily for 7 days, THEN 4 tablets (40 mg total) daily for 7 days. Take with food.. Do you have any questions regarding your medications?  NO Are you  a smoker?    YES If yes Recommend  Smoking Cessation  Do you have lab work to be drawn?   YES   NO   When will you be getting lab work done? Scheduled PCP visit on:Other scheduled MD Appointment:[place] Abdomen Pelvis without IV ContrastTuesday May 17, 2020 12:15 PMCystoscopy on 09/23 @ 2:30pm with   Paul Hunter Paul Hunter, APRNFriday May 20, 2020 9:00 Paul Hunter, [facility] May 23, 2020 9:00 AMDo you have transportation to appointment?     YES  Any other Barriers identified: No Any Questions: No Signs and symptoms to report to MD reviewed as well as when to seek out immediate medical attention and call 911 TCM Action: TCM called and spoke with pt Very pleasant, States he is feeling good, He denies any SOB or chest pain, No dizziness or lightheadedness, No complaints of palpitations, He does report he has a bad back and has some discomfort this morning.  We reviewed medications.He has them all and reports an understanding. He was discharged  on prednisone taper. He uses a pill box to remain organized.  He reports he is voiding in good amounts, States the Lasix is working He does not have a scale but plans to purchase one, Until he purchases a scale TCM advised him to monitor for fluid retention, If he notes increased swelling in his feet ankles legs or abdomen or increased SOB tcm advised him to contact MD. Once he gets a scale TCM instructed him to weigh himself every morning after using the bathroom and before eating or drinking. Advised to contact MD if he gains 2-3 lbs in 1 day or 5 or more lbs in 1 week. He states he does have swelling in his legs and feet but has definitely improved since admission.Also advised to contact MD if SOB increases. He is knowledgeable to eat low sodium heart healthy diet. He states he likes to cook and uses very little salt. TCM reviewed foods to avoid as well as good food choices. Appointment dates reviewed with pt.   Signs and symptoms to report to MD reviewed. Pt agrees to another TCM call:     YES  TCM name and number provided and advised to call with any concernsTCM end date 06/11/20

## 2020-05-13 NOTE — Telephone Encounter
Called pt and scheduled Cysto with Dr. Meryl Dare per message below-The pt confirmed the appt date and time.

## 2020-05-13 NOTE — Plan of Care
Plan of Care Overview/ Patient Status    Pt AOx4, VSS on RA. Meds reconciled for home. Safety maintained.Paul Hunter was discharged via [employer] accompanied by [employer].  Verbalized understanding of discharge instructionsand recommended follow up care as per the after visit summary.  Written discharge instructions provided. Denies any further questions. Vital signs    Vitals:  05/11/20 2106 05/12/20 0808 05/12/20 1300 05/12/20 1450 BP: (!) 141/73 (!) 147/87  122/68 Pulse: 79 72  87 Resp:  19  20 Temp:  97.5 ?F (36.4 ?C)  97.9 ?F (36.6 ?C) TempSrc:  Oral  Oral SpO2: 94% 96%  95% Weight:   (!) 138.3 kg  Height:

## 2020-05-14 ENCOUNTER — Inpatient Hospital Stay: Admit: 2020-05-14 | Discharge: 2020-05-14 | Payer: PRIVATE HEALTH INSURANCE

## 2020-05-14 ENCOUNTER — Telehealth: Admit: 2020-05-14 | Payer: PRIVATE HEALTH INSURANCE | Attending: Family

## 2020-05-14 DIAGNOSIS — C3490 Malignant neoplasm of unspecified part of unspecified bronchus or lung: Secondary | ICD-10-CM

## 2020-05-14 DIAGNOSIS — Z20822 Contact with and (suspected) exposure to covid-19: Secondary | ICD-10-CM

## 2020-05-14 LAB — COMPREHENSIVE METABOLIC PANEL
BKR A/G RATIO: 1.4 (ref 1.0–2.2)
BKR ALANINE AMINOTRANSFERASE (ALT): 59 U/L (ref 9–59)
BKR ALBUMIN: 3.4 g/dL — ABNORMAL LOW (ref 3.6–4.9)
BKR ALKALINE PHOSPHATASE: 57 U/L (ref 9–122)
BKR ANION GAP: 11 (ref 7–17)
BKR ASPARTATE AMINOTRANSFERASE (AST): 23 U/L (ref 10–35)
BKR AST/ALT RATIO: 0.4
BKR BILIRUBIN TOTAL: 0.3 mg/dL (ref <=1.2)
BKR BLOOD UREA NITROGEN: 42 mg/dL — ABNORMAL HIGH (ref 8–23)
BKR BUN / CREAT RATIO: 26.8 — ABNORMAL HIGH (ref 8.0–23.0)
BKR CALCIUM: 8.5 mg/dL — ABNORMAL LOW (ref 8.8–10.2)
BKR CHLORIDE: 105 mmol/L (ref 98–107)
BKR CO2: 25 mmol/L (ref 20–30)
BKR CREATININE: 1.57 mg/dL — ABNORMAL HIGH (ref 0.40–1.30)
BKR EGFR (AFR AMER): 54 mL/min/{1.73_m2} (ref >60)
BKR EGFR (NON AFRICAN AMERICAN): 44 mL/min/1.73m2 (ref >60)
BKR GLOBULIN: 2.5 g/dL (ref 2.3–3.5)
BKR GLUCOSE: 117 mg/dL — ABNORMAL HIGH (ref 70–100)
BKR POTASSIUM: 4.3 mmol/L (ref 3.3–5.1)
BKR PROTEIN TOTAL: 5.9 g/dL — ABNORMAL LOW (ref 6.6–8.7)
BKR SODIUM: 141 mmol/L — AB (ref 136–144)

## 2020-05-14 LAB — CBC WITH AUTO DIFFERENTIAL
BKR EPITHELIAL CELLS MANUAL: 33.6 g/dL — ABNORMAL HIGH (ref 31.0–36.0)
BKR UROBILINOGEN, UA: 11.9 g/dL — ABNORMAL LOW (ref 12.0–18.0)
BKR WAM ABSOLUTE IMMATURE GRANULOCYTES: 0.1 x 1000/??L (ref 0.0–0.3)
BKR WAM ABSOLUTE LYMPHOCYTE COUNT: 1 x 1000/ÂµL (ref 1.0–4.0)
BKR WAM ABSOLUTE NRBC: 0 x 1000/??L (ref 0.0–0.0)
BKR WAM ANALYZER ANC: 10 x 1000/ÂµL (ref 1.0–11.0)
BKR WAM BASOPHIL ABSOLUTE COUNT: 0 x 1000/ÂµL (ref 0.0–0.0)
BKR WAM BASOPHILS: 0.1 % (ref 0.0–4.0)
BKR WAM EOSINOPHIL ABSOLUTE COUNT: 0 x 1000/ÂµL (ref 0.0–1.0)
BKR WAM EOSINOPHILS: 0 % (ref 0.0–7.0)
BKR WAM HEMATOCRIT: 35.4 % — ABNORMAL LOW (ref 37.0–52.0)
BKR WAM HEMOGLOBIN: 11.9 g/dL — ABNORMAL LOW (ref 12.0–18.0)
BKR WAM IMMATURE GRANULOCYTES: 1 % (ref 0.0–3.0)
BKR WAM LYMPHOCYTES: 8.4 % (ref 8.0–49.0)
BKR WAM MCH (PG): 31.7 pg — ABNORMAL HIGH (ref 27.0–31.0)
BKR WAM MCHC: 33.6 g/dL (ref 31.0–36.0)
BKR WAM MONOCYTE ABSOLUTE COUNT: 0.3 x 1000/ÂµL (ref 0.0–2.0)
BKR WAM MONOCYTES: 2.6 % — ABNORMAL LOW (ref 4.0–15.0)
BKR WAM MPV: 10 fL (ref 6.0–11.0)
BKR WAM NEUTROPHILS: 87.9 % — ABNORMAL HIGH (ref 37.0–84.0)
BKR WAM NUCLEATED RED BLOOD CELLS: 0 % (ref 0.0–1.0)
BKR WAM PLATELETS: 169 x1000/ÂµL (ref 140–440)
BKR WAM RDW-CV: 15.8 % — ABNORMAL HIGH (ref 11.5–14.5)
BKR WAM RED BLOOD CELL COUNT: 3.8 M/??L (ref 3.8–5.9)
BKR WAM WHITE BLOOD CELL COUNT: 11.4 x1000/??L — ABNORMAL HIGH (ref 4.0–10.0)

## 2020-05-14 LAB — URINALYSIS-MACROSCOPIC W/REFLEX MICROSCOPIC
BKR BILIRUBIN, UA: NEGATIVE mmol/L (ref 3.3–5.1)
BKR GLUCOSE, UA: NEGATIVE
BKR KETONES, UA: NEGATIVE pg — ABNORMAL HIGH (ref 27.0–31.0)
BKR LEUKOCYTE ESTERASE, UA: POSITIVE — AB
BKR NITRITE, UA: NEGATIVE
BKR PH, UA: 6 x 1000/??L (ref 5.5–7.5)
BKR SPECIFIC GRAVITY, UA: 1.02 (ref 1.005–1.030)
BKR WAM ABSOLUTE NRBC: NEGATIVE x 1000/??L (ref 0.0–0.0)

## 2020-05-14 LAB — URINE MICROSCOPIC     (BH GH LMW YH)

## 2020-05-14 LAB — SARS COV-2 (COVID-19) RNA-~~LOC~~ LABS (BH GH LMW YH): BKR SARS-COV-2 RNA (COVID-19) (YH): NEGATIVE

## 2020-05-14 MED ORDER — LIDOCAINE-PRILOCAINE 2.5 %-2.5 % TOPICAL CREAM
2.5-2.5 % | TOPICAL | 1 refills | Status: SS | PRN
Start: 2020-05-14 — End: 2020-05-20

## 2020-05-14 MED ORDER — LIDOCAINE 2 % MUCOSAL JELLY IN APPLICATOR
2 % | Freq: Once | URETHRAL | 1 refills | Status: AC
Start: 2020-05-14 — End: ?

## 2020-05-14 MED ORDER — OXYBUTYNIN CHLORIDE ER 5 MG TABLET,EXTENDED RELEASE 24 HR
5 mg | ORAL_TABLET | Freq: Every day | ORAL | 1 refills | Status: AC
Start: 2020-05-14 — End: ?

## 2020-05-14 NOTE — Telephone Encounter
[place] Answering Service:Paul Hunter is a 69 y.o.?with PMHx of NSCLC (currently on carbo/taxol/pembro every 3 weeks, currently on cycle 4), CAD (2 Mis, 3 stents) and sleep apnea.  Recently discharged from the hospital with heart failure currently on diuretics.  Although he has intermittently had blood in his urine, today he notes that he is having difficulty going, that here is a lot of pain and burning which is new.  He does not have a fever. He does not have back pain.  The ECC has accepted him for evaluation.Elenore Rota, APRN?

## 2020-05-15 ENCOUNTER — Encounter: Admit: 2020-05-15 | Payer: PRIVATE HEALTH INSURANCE | Attending: Hematology

## 2020-05-15 DIAGNOSIS — N400 Enlarged prostate without lower urinary tract symptoms: Secondary | ICD-10-CM

## 2020-05-15 DIAGNOSIS — I10 Essential (primary) hypertension: Secondary | ICD-10-CM

## 2020-05-15 DIAGNOSIS — E119 Type 2 diabetes mellitus without complications: Secondary | ICD-10-CM

## 2020-05-15 DIAGNOSIS — E291 Testicular hypofunction: Secondary | ICD-10-CM

## 2020-05-15 DIAGNOSIS — F419 Anxiety disorder, unspecified: Secondary | ICD-10-CM

## 2020-05-15 DIAGNOSIS — E559 Vitamin D deficiency, unspecified: Secondary | ICD-10-CM

## 2020-05-15 DIAGNOSIS — I2510 Atherosclerotic heart disease of native coronary artery without angina pectoris: Secondary | ICD-10-CM

## 2020-05-15 DIAGNOSIS — E785 Hyperlipidemia, unspecified: Secondary | ICD-10-CM

## 2020-05-15 DIAGNOSIS — Z720 Tobacco use: Secondary | ICD-10-CM

## 2020-05-15 DIAGNOSIS — M4800 Spinal stenosis, site unspecified: Secondary | ICD-10-CM

## 2020-05-15 DIAGNOSIS — K219 Gastro-esophageal reflux disease without esophagitis: Secondary | ICD-10-CM

## 2020-05-15 LAB — URINE CULTURE
BKR COLOR, UA: NO GROWTH x 1000/??L — AB (ref 0.0–0.0)
BKR URINE CULTURE, ROUTINE: NO GROWTH

## 2020-05-15 NOTE — Progress Notes
Philip Kotlyar Delis arrived to [facility] in wheelchair accompanied by family. A&O x4. BP 138/79  - Pulse 65  - Temp 98.6 ?F (37 ?C)  - Resp 20  - SpO2 98%  on RA. He is here for urinary symptoms of retention, blood in the urine, pain in the urethra, and general discomfort in th bladder. Bladder scan done for 110cc. He was unable to void for UA/UC specimens. R single lumen port accessed, flushes well with good blood return. Labs and covid swab obtained. 85fr foley placed per APP verbal order. Immediate return of bloody urine, roughly 400ccs. Urology at bedside (MD Press) to irrigate catheter. H&H stable. Awaiting return of urology. Dispo home likely as urology f/u already scheduled for this week.Urology back to bedside to clear Elmon Else Ellefson for d/c home. Roughly 20 minutes spent with Elmon Else Moeckel by RN performing education on foley care/maintenance. He was given an additional leg bag and over night bag. Port flushed and de-accessed without issue. Meds ordered by APP to preferred pharmacy. Hever Castilleja Festa left the ECC in wheelchair accompanied by family.

## 2020-05-15 NOTE — Other
[facility]    [facility]     Urology Consult History and Physical    Reason for Consult: gross hematuria  Requested by: [facility]  Urology Attending: Dr. Luan Pulling    Patient exam or treatment required medical chaperone.  The sensitive parts of the examination were performed with chaperone present: Yes; Chaperone Name, Role/Title: Keenan Bachelor, RN    HPI:   Paul Hunter. Duma is a very pleasant 69 year old male with a history of NSCLC (currently on Carbo/Taxol/Pembro every 3 weeks, on cycle 4), CAD (2 Mis, 3 stents, on Aspirin/Plavix), sleep apnea on CPAP, nephrolithiasis and recurrent hematuria (with multiple prior negative work-ups, most recently in 2019) who presents to the [facility] with symptomatic urinary retention since this morning. He was unable to void and provide a urine sample so he was catheterized. Urine was noted to be bloody prompting urologic consultation. Denies dizziness, lightheadedness, fevers, chills. Reports that his suprapubic pressure has resolved and that he only notes pain at the tip of his penis likely related to the catheter. Denies flank pain.    Of note he was seen as a new patient on September 3rd for gross hematuria. Scheduled for outpatient [place] Urogram on 9/24 as well as cystoscopy with Dr. Meryl Dare on 9/23. Recently hospitalized with decompensated heart failure from 9/13-16 where he had intermittent hematuria.    Afebrile and hemodynamically stable on presentation. Labs notable for WBC 11.4 (13.8 on discharge). H/H 11.9/35.4 (13.4/40.5 on discharge), Cr 1.57 (1.51 on discharge).    On evaluation he has a 16 Fr foley in place draining light pink urine. Manual irrigation was performed without return of clots and clearing of urine color. After 1 hour his urine remained light pink and cleared with manual irrigation.    ROS: A 10 system ROS is negative except per HPI    PMH:  has a past medical history of Anxiety, CAD (coronary artery disease), Diabetes mellitus (HC Code), GERD (gastroesophageal reflux disease), Hyperlipidemia, Hypertension, Hypertrophy of prostate without urinary obstruction and other lower urinary tract symptoms (LUTS), Other testicular hypofunction, Spinal stenosis (09/28/2018), Tobacco abuse, and Vitamin D deficiency.  PSH:  has a past surgical history that includes Carotid stent (08/28/2003); Cholecystectomy (11/07/2016); Knee surgery; Laminectomy (10/07/2008); and Lumbar disc surgery.  Allergies: has No Known Allergies.  FH: family history is not on file.  SH:  reports that he has been smoking cigarettes. He does not have any smokeless tobacco history on file. No history on file for alcohol use and drug use.    Medications: albuterol sulfate, aspirin, atorvastatin, carboxymethylcellulose, clopidogreL, escitalopram oxalate, fexofenadine, furosemide, gabapentin, metoprolol succinate XL, multivitamin, polyethylene glycol, predniSONE, and tumeric-ging-olive-oreg-capryl    Vitals:  Temp:  [98.6 ?F (37 ?C)] 98.6 ?F (37 ?C)  Pulse:  [65] 65  Resp:  [20] 20  BP: (138)/(79) 138/79  SpO2:  [98 %] 98 %  Device (Oxygen Therapy): room air    Exam:  Gen: Alert, oriented, NAD  CV: RR  Pulm: Non-labored breathing  Abd: soft, NT/ND  Ext: WWP  GU:  Normal phallus, testes descended bilaterally, no suprapubic tenderness, no CVA tenderness. Foley in place draining clear light pink urine    Labs:  WBC/Hgb/Hct/Plts:  11.4/11.9/35.4/169 (09/18 1738)  Na/K/Cl/CO2:  141/4.3/105/25 (09/18 1738)  BUN/Cr/GLU/ALT/AST/AMYLASE/LIPASE:  42/1.57/117/59/23/--/-- (09/18 1738)     UA:  Lab Results   Component Value Date    SPECGRAV 1.020 05/14/2020    PHUR 6.0 05/14/2020    KETONESU Negative 05/14/2020  PROTEINUA 2+ (A) 05/14/2020    BLOODU 3+ (A) 05/14/2020    LEUKOCYTESUR Positive (A) 05/14/2020    NITRITE Negative 05/14/2020     Microbiology:   Reviewed    Radiology:   No results found.    ASSESSMENT/RECOMMENDATIONS:  69 year old male with a history of NSCLC (currently on Carbo/Taxol/Pembro every 3 weeks, on cycle 4), CAD (2 Mis, 3 stents, on Aspirin/Plavix), sleep apnea on CPAP, nephrolithiasis and recurrent hematuria (with multiple prior negative work-ups, most recently in 2019) who presents to the [facility] with symptomatic urinary retention and gross hematuria. Does not appear to be in clot retention. Urine cleared easily with manual irrigation which was able to be done very easily. Has very close follow-up with Urology.    - OK to discharge from GU perspective  - Discussed upsizing foley catheter which patient would prefer not to do so. Given that catheter has been draining well since placement this is not unreasonable.  - Pyridium 100 mg TID for bladder pain  - Urojet as needed for urethral discomfort  - Maintain foley catheter until appointment next Thursday with Dr. Meryl Dare  - ED precautions discussed including worsening of hematuria and if the catheter stops draining    Discussed with attending Dr. Luan Pulling  ________________  [name], MD  05/14/2020  6:39 PM   [facility] Floor Pager (for primary patients): [phone]  [facility] Consult Pager: [phone] from 6am-6pm  [facility] Urology Pager: [facility] Dynamic Role from 6am-6pm  Amion for nights/weekends

## 2020-05-15 NOTE — Progress Notes
Re: Paul Fiene AstorinoMRN: [id number] DOB: 09/13/1952Referring Provider: Wolfgang Phoenix Provider: [facility] Provider Progress NoteChief Complaint: Patient [facility] with Chief Complaint Patient presents with ? Male GU Problem History of Present Illness/Oncology History:The patient is a 69 y.o. male who was diagnosed with lung cancer, currently on treatment with carbo/taxol/pembro. He presents to the [facility] with urinary frequency/urgency and hematuria. Of note, patient's hematuria is not new, he was last seen in [facility] for cardioOnc workup admission- he was noted to have hematuria at that time. While in the [facility], patient reports pain in his penis, endorses urgency and frequency. He denies other acute findings including fevers/chills.  Treatment Plan Information Perfect, Kishawn Hutcherson   OP Pembrolizumab + Paclitaxel + Carboplatin (AUC 6) x 4 cycles, then Pembrolizumab Maintenance   Current Cycle Treatment Dates Line of Treatment Treatment Goal Treatment Plan Provider Treatment Department Status Auth Status  3 of 7 cycles 02/11/2020 to 09/08/2020 D. First Line Disease Control/Palliate Symptoms Elder Negus, MD [facility] Medical Oncology Infusion at [address] Active   Created By Created On Updated By Updated On  Elder Negus, MD 01/20/2020  9:58 AM Acquanetta Belling 03/31/2020 10:19 AM  Assessment/Plan/OECC Course:# Hematuria --hx nephrolithiasis and recurrent hematuria --Scheduled for OP urogram and cystoscopy 09/23-24--Labs stable while in ECC --16 Fr Foley placed in [facility], urology consulted- manual irrigation performed --Patient d/c with leg bag and overnight bag for foley--F/C education provided by [facility] RN --RX for urojet sent to patient's pharmacy # NSCLC  --Primary oncology care by Dr Layla Barter --He will follow up with his primary oncology team ECOG performance status at the time of the visit: 2Past Medical History: Diagnosis Date ? Anxiety  ? CAD (coronary artery disease)  ? Diabetes mellitus (HC Code)  ? GERD (gastroesophageal reflux disease)  ? Hyperlipidemia  ? Hypertension  ? Hypertrophy of prostate without urinary obstruction and other lower urinary tract symptoms (LUTS)  ? Other testicular hypofunction  ? Spinal stenosis 09/28/2018 ? Tobacco abuse  ? Vitamin D deficiency  Past Surgical History: Procedure Laterality Date ? CAROTID STENT  08/28/2003 ? CHOLECYSTECTOMY  11/07/2016 ? KNEE SURGERY    torn meniscus ? LAMINECTOMY  10/07/2008 ? LUMBAR DISC SURGERY    herniated disc No Known AllergiesCurrent Outpatient Medications: ?  aspirin 81 MG EC tablet, Take 81 mg by mouth daily., Disp: , Rfl: ?  atorvastatin (LIPITOR) 80 mg tablet, Take 1 tablet (80 mg total) by mouth nightly., Disp: 30 tablet, Rfl: 6?  carboxymethylcellulose (REFRESH PLUS) 0.5 % ophthalmic solution in dropperette, Place 1 drop into both eyes 3 (three) times daily as needed., Disp: , Rfl: ?  clopidogreL (PLAVIX) 75 mg tablet, Take 1 tablet (75 mg total) by mouth daily., Disp: 30 tablet, Rfl: 6?  escitalopram oxalate (LEXAPRO) 10 mg tablet, Take 1 tablet (10 mg total) by mouth nightly., Disp: 90 tablet, Rfl: 3?  fexofenadine (ALLEGRA) 180 mg tablet, Take 180 mg by mouth daily as needed. (Patient not taking: Reported on 05/10/2020), Disp: , Rfl: ?  furosemide (LASIX) 40 mg tablet, Take 1 tablet (40 mg total) by mouth daily., Disp: 30 tablet, Rfl: 2?  gabapentin (NEURONTIN) 300 mg capsule, Take 2 capsules (600 mg total) by mouth at bedtime., Disp: 60 capsule, Rfl: 0?  lidocaine uro-jet (XYLOCAINE) 2 % jelly, 11 mLs by INTRA-URETHRAL route once for 1 dose., Disp: 11 mL, Rfl: 0?  lidocaine-prilocaine (EMLA) 2.5-2.5 % cream, Apply topically as needed., Disp: 30 g, Rfl: 0?  metoprolol succinate XL (TOPROL-XL) 25 mg 24 hr tablet, Take 0.5 tablets (12.5 mg total) by mouth daily. Take with or immediately following a meal., Disp: 30 tablet, Rfl: 0?  multivitamin capsule, Take 1 capsule by mouth daily. (Patient not taking: Reported on 05/10/2020), Disp: , Rfl: ?  oxybutynin XL (DITROPAN-XL) 5 mg 24 hr tablet, Take 1 tablet (5 mg total) by mouth daily., Disp: 90 tablet, Rfl: 0?  polyethylene glycol (GLYCOLAX; MIRALAX) 17 gram/dose powder, Take by mouth., Disp: , Rfl: ?  predniSONE (DELTASONE) 10 mg tablet, Take 13 tablets (130 mg total) by mouth daily for 7 days, THEN 12 tablets (120 mg total) daily for 7 days, THEN 11 tablets (110 mg total) daily for 7 days, THEN 10 tablets (100 mg total) daily for 7 days, THEN 9 tablets (90 mg total) daily for 7 days, THEN 8 tablets (80 mg total) daily for 7 days, THEN 7 tablets (70 mg total) daily for 7 days, THEN 6 tablets (60 mg total) daily for 7 days, THEN 5 tablets (50 mg total) daily for 7 days, THEN 4 tablets (40 mg total) daily for 7 days. Take with food.., Disp: 595 tablet, Rfl: 0?  [START ON 06/28/2020] predniSONE (DELTASONE) 10 mg tablet, Take 3 tablets (30 mg total) by mouth daily for 7 days, THEN 2 tablets (20 mg total) daily for 7 days, THEN 1 tablet (10 mg total) daily for 7 days, THEN 0.5 tablets (5 mg total) daily for 7 days, THEN 0.5 tablets (5 mg total) daily for 7 days. Take with food.., Disp: 49 tablet, Rfl: 0?  tumeric-ging-olive-oreg-capryl 100 mg-150 mg- 50 mg-150 mg Cap, Take by mouth., Disp: , Rfl: Review of Systems Constitutional: Negative for appetite change, chills, diaphoresis, fatigue and fever. HENT:   Negative for mouth sores, sore throat and trouble swallowing.  Respiratory: Negative for cough, shortness of breath and wheezing.  Cardiovascular: Negative for chest pain and leg swelling. Gastrointestinal: Negative for abdominal distention, abdominal pain, blood in stool, constipation, diarrhea, nausea and vomiting. Genitourinary: Positive for difficulty urinating, dysuria, frequency and hematuria. Negative for bladder incontinence.  Musculoskeletal: Negative for arthralgias, back pain, gait problem and neck pain. Skin: Negative for itching, rash and wound. Neurological: Negative for dizziness, extremity weakness, gait problem, headaches, light-headedness and numbness. Hematological: Does not bruise/bleed easily. Psychiatric/Behavioral: Negative for confusion. The patient is not nervous/anxious.  Physical ExamConstitutional:     General: He is not in acute distress.   Appearance: He is not diaphoretic. HENT:    Head: Normocephalic and atraumatic. Eyes:    Conjunctiva/sclera: Conjunctivae normal.    Pupils: Pupils are equal, round, and reactive to light. Pulmonary:    Effort: Pulmonary effort is normal. No respiratory distress.    Breath sounds: No wheezing. Abdominal:    General: There is no distension.    Palpations: There is no mass.    Tenderness: There is no abdominal tenderness. There is no guarding. Genitourinary:   Comments: HematuriaFoley presentMusculoskeletal:       General: Normal range of motion.    Cervical back: Normal range of motion and neck supple. Skin:   General: Skin is warm and dry.    Coloration: Skin is not pale.    Findings: No erythema or rash. Neurological:    Mental Status: He is alert and oriented to person, place, and time. Psychiatric:       Mood and Affect: Affect normal.       Judgment: Judgment normal. OE[facility] Labs/Diagnostic Imaging/Procedures:Invalid input(s): <1>Complete Blood CountResults in  Past 7 DaysResult Component Current Result Hematocrit 35.4 (L) (05/14/2020) Hemoglobin 11.9 (L) (05/14/2020) MCH 31.7 (H) (05/14/2020) MCHC 33.6 (05/14/2020) MCV 94.4 (H) (05/14/2020) MPV 10.0 (05/14/2020) Platelets 169 (05/14/2020) RBC 3.8 (05/14/2020) WBC 11.4 (H) (05/14/2020) Consults called: Medical subspecialty: UrologyDischarge Diagnosis:Other hematuriaOECC Disposition:Discharged Home Massie Kluver, APRN

## 2020-05-15 NOTE — Progress Notes
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 is a 69 y.o. gentleman with 75py smoking history, history of CAD s/p MIx2 and PCI, who has now transfered care from [place] to [place] for his stage IV NSCLC.  ?He noticed worsening DOE around November and December 2020.  Chest Xray done in 09/2019 showed bilateral pulmonary nodules and chest [place] done shortly after showed numerous bilateral nodules with dominant left lung nodule measuring 1.4cm, as well as bilateral axillary, hilar and mediastinal nodes.  PET [place] 11/04/19 showed hyperavid lung nodules as well as diffuse adenopathy in neck, chest and upper abdomen.  ?He underwent ultrasound guided biopsy of infraclavicular lymph node on 11/18/19, which showed focal atypical cells concerning for carcinoma.   He subsequently underwent EBUS with biopsy of station 7 node, which was positive for metastatic squamous cell carcinoma.  There was insufficient material for molecular testing or PD-L1 staining.  - 11/28/19-03/10/20: Carbo/Taxol/Pembro - received 2 cycles in [place] prior to transitioning care.Current Treatment:  Carbo/Taxol/Pembro q21 days Interim History: [name] presents today for followup.  He received cycle 4 carbo/taxol/pembro on 7/15.Over the past few days, he has developed PND, orthopnea, LE edema.  He now has debilitating SOB with just a few steps.  No change in cough but still there, nonproductive. His weight has also gone up dramatically in the last 10 days- about 30 lbs.No chest pain, jaw pain, arm pain, nausea with exertion.Started on plavix last week after PET showed evidence of ischemia.  He had blood in urine Saturday since then no gross hematuria.  Cysto scheduled for this week.  Also has bruising on the forearms with mild trauma.  Bowels are now back to normal.Review of Systems: Pertinent items are noted in HPI.  A 12-point review of systems was reviewed and is otherwise negative except as stated in the HPI.+hyperphagiaAllergies: Patient has no known allergies.Medications: Current Outpatient Medications Medication Sig ? aspirin 81 MG EC tablet Take 81 mg by mouth daily. ? atorvastatin (LIPITOR) 80 mg tablet Take 1 tablet (80 mg total) by mouth nightly. ? clopidogreL (PLAVIX) 75 mg tablet Take 1 tablet (75 mg total) by mouth daily. ? escitalopram oxalate (LEXAPRO) 10 mg tablet Take 1 tablet (10 mg total) by mouth nightly. ? fexofenadine (ALLEGRA) 180 mg tablet Take 180 mg by mouth daily as needed. ? furosemide (LASIX) 20 mg tablet Take 2 tablets (40 mg total) by mouth daily. ? gabapentin (NEURONTIN) 300 mg capsule Take 2 capsules (600 mg total) by mouth at bedtime. ? icosapent ethyL (VASCEPA) 1 gram capsule Take 2 capsules (2 g total) by mouth 2 (two) times daily (0800, 1800). ? lisinopriL (PRINIVIL,ZESTRIL) 2.5 mg tablet Take 1 tablet (2.5 mg total) by mouth daily. ? multivitamin capsule Take 1 capsule by mouth daily. ? polyethylene glycol (GLYCOLAX; MIRALAX) 17 gram/dose powder Take by mouth. ? predniSONE (DELTASONE) 10 mg tablet Take 13 tablets (130 mg total) by mouth daily for 7 days, THEN 12 tablets (120 mg total) daily for 7 days, THEN 11 tablets (110 mg total) daily for 7 days, THEN 10 tablets (100 mg total) daily for 7 days, THEN 9 tablets (90 mg total) daily for 7 days, THEN 8 tablets (80 mg total) daily for 7 days, THEN 7 tablets (70 mg total) daily for 7 days, THEN 6 tablets (60 mg total) daily for 7 days,  THEN 5 tablets (50 mg total) daily for 7 days, THEN 4 tablets (40 mg total) daily for 7 days. Take with food.. ? [START ON 06/28/2020] predniSONE (DELTASONE) 10 mg tablet Take 3 tablets (30 mg total) by mouth daily for 7 days, THEN 2 tablets (20 mg total) daily for 7 days, THEN 1 tablet (10 mg total) daily for 7 days, THEN 0.5 tablets (5 mg total) daily for 7 days, THEN 0.5 tablets (5 mg total) daily for 7 days. Take with food.. ? prochlorperazine (COMPAZINE) 10 mg tablet Take 1 tablet (10 mg total) by mouth every 6 (six) hours as needed for nausea. ? tiotropium (SPIRIVA WITH HANDIHALER) 18 mcg capsule for inhalation Inhale 1 capsule (18 mcg total) into the lungs daily. ? traMADoL (ULTRAM) 50 mg tablet Take 1 tablet (50 mg total) by mouth every 6 (six) hours as needed. ? tumeric-ging-olive-oreg-capryl 100 mg-150 mg- 50 mg-150 mg Cap Take by mouth. No current facility-administered medications for this visit. General Appearance:  Appears comfortable while sitting in chair Mouth/Throat:Neck: Buccal surfaces normal; Eyes with no no erythema or erosionsNo cervical Lymphadenopathy.  Port with no erythema.  No JVD although difficult to tell based on neck girth. Lungs:   Clear to auscultation bilaterally, respirations unlabored Heart: Abdominal: Regular rate and rhythm, S1 and S2 normal, no murmur, rub or gallopSoft, NT, no masses or hepatomegaly or splenonegaly.  + bowel sounds Extremities: 2+ edema to the knees, no axillary adenopathy.  Skin: No rashes.  Neurologic:Lymph nodes:  Mental status NL. Strength normal.No enlarged lymph nodes in the cervical, supraclavicular, axillary. Exam: ECOG Performance Status: 1Vitals:Temp:  [98.6 ?F (37 ?C)] 98.6 ?F (37 ?C)Pulse:  [78] 78Resp:  [18] 18BP: (143)/(75) 143/75SpO2:  [95 %] 95 %Wt: 05/09/20 (!) 146.4 kg 04/29/20 131.5 kg 04/14/20 132.2 kg 04/08/20 132.4 kg 03/31/20 134.3 kg 03/25/20 133.1 kg Laboratory Data: Results for orders placed or performed in visit on 05/09/20 Troponin T     (BH Q YH) Result Value Ref Range  Troponin T 0.02 (H) <0.01 ng/mL Magnesium Result Value Ref Range  Magnesium 2.0 1.7 - 2.4 mg/dL Comprehensive metabolic panel Result Value Ref Range  Sodium 136 136 - 144 mmol/L  Potassium 4.8 3.3 - 5.1 mmol/L  Chloride 103 98 - 107 mmol/L  CO2 24 20 - 30 mmol/L  Anion Gap 9 7 - 17  Glucose 134 (H) 70 - 100 mg/dL  BUN 32 (H) 8 - 23 mg/dL  Creatinine 1.61 (H) 0.96 - 1.30 mg/dL  Calcium 8.5 (L) 8.8 - 10.2 mg/dL  BUN/Creatinine Ratio 04.5 (H) 8.0 - 23.0  Total Protein 6.0 (L) 6.6 - 8.7 g/dL  Albumin 3.4 (L) 3.6 - 4.9 g/dL  Total Bilirubin 0.3 <=4.0 mg/dL  Alkaline Phosphatase 52 9 - 122 U/L  Alanine Aminotransferase (ALT) 47 9 - 59 U/L  Aspartate Aminotransferase (AST) 16 10 - 35 U/L  Globulin 2.6 2.3 - 3.5 g/dL  A/G Ratio 1.3 1.0 - 2.2  AST/ALT Ratio 0.3 See Comment  eGFR (Afr Amer) >60 >60 mL/min/1.51m2  eGFR (NON African-American) 52 >60 mL/min/1.50m2 CBC auto differential Result Value Ref Range  WBC 15.5 (H) 4.0 - 10.0 x1000/?L  RBC 4.0 3.8 - 5.9 M/?L  Hemoglobin 12.7 12.0 - 18.0 g/dL  Hematocrit 98.1 19.1 - 52.0 %  MCV 94.8 (H) 78.0 - 94.0 fL  MCHC 33.5 31.0 - 36.0 g/dL  RDW-CV 47.8 (H) 29.5 - 14.5 %  Platelets 227 140 - 440 x1000/?L  MPV 9.8 6.0 - 11.0 fL  ANC (Abs Neutrophil Count) 13.8 (H) 1.0 - 11.0 x 1000/?L  Neutrophils 89.3 (H) 37.0 - 84.0 %  Lymphocytes 6.3 (L) 8.0 - 49.0 %  Absolute Lymphocyte Count 1.0 1.0 - 4.0 x 1000/?L  Monocytes 3.1 (L) 4.0 - 15.0 %  Monocyte Absolute Count 0.5 0.0 - 2.0 x 1000/?L  Eosinophils 0.0 0.0 - 7.0 %  Eosinophil Absolute Count 0.0 0.0 - 1.0 x 1000/?L  Basophil 0.1 0.0 - 4.0 %  Basophil Absolute Count 0.0 0.0 - 0.0 x 1000/?L  Immature Granulocytes 1.2 0.0 - 3.0 %  Absolute Immature Granulocyte Count 0.2 0.0 - 0.3 x 1000/?L  nRBC 0.0 0.0 - 1.0 %  Absolute nRBC 0.0 0.0 - 0.0 x 1000/?L  MCH 31.8 (H) 27.0 - 31.0 pg  Diagnostic Imaging: [place] Chest w IV contrast: 7/31/2021No significant change in tumor evidenced by unchanged bilateral pulmonary metastases, mediastinal, hilar and axillary lymphadenopathy. Some pulmonary metastases are cavitary.[place] Abd/Pelv: 7/31/2021Decrease in size of retroperitoneal/retrocrural lymph nodes which now all measure less than 1 cm.PET perfusion and cardiac MRI reviewed.Impression: Mr. Zavada is a 69 y.o. man with a 75py smoking history, history of CAD s/p MIx2 and PCI, diagnosed with stage IV NSCLC in the fall of 2020. ?He was started on treatment with Carbo/Taxol/Pembrolizumab on 12/25/19 in [place] where he received?2 cycles. ?He tolerated treatment?relatively well overall, but has developed some neuropathy following?the?second cycle. Taxol was dose reduced by 20% starting with cycle 3 due to neuropathy. He completed cycle 4 on 7/15. Rest[place] imaging on 7/31 showed stable/improving disease, including improvements in his RP LAD. 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  Trop and BNP normal. Started on prednisone 130mg  daily and has been tapered over the last 2 weeks.  PET stress with findings suspicious for multi-vessel CAD, resting EF 40%-->stress 36%.  Therefore, was started on plavix per cardio-onc last week.  Today he has evidence of worsening CHF syndrome with weight gain (30 lbs in 10 days), significant acute LE pitting edema, and worsening dyspnea with exertion.  Labs sent today show troponin 0.02 with BNP pending.  We discussed with cardio-onc and decided that with new troponin elevation and worsening signs and symptoms of decompensated heart failure, despite his appearing well at rest and without hypoxia, he should come in to medicine with telemetry for diuresis and further workup with cardio-onc consult for evaluation/management of worsening myocarditis/HFrEF and diuresis.  Case discussed with ECC who will call me back when there is a bed with plan to admit to medicine from ECC.  For lung cancer, we have scout images from PET myocardial study which show progression although this is not definitive and needs to be confirmed with dedicated [place] CAP which we planned to get in a few weeks since last scans were 7/30.  Will get in 2 weeks and followup with him in clinic to discuss results.  Plan: - Stage IV SCC: will plan to repeat [place] scans in the next 2 weeks-CAD/Myocarditis: Admit to med tele with card consult for workup new worsening decompensated CHF.  Continue prednisone for now, will need to start bactrim if the plan is to remain on it.- Neuropathy: Continue gabapentin-Hematuria: Plan for cystoscopy this week, w/u per urologySeen and discussed with attending, Dr. Dyke Weible.___________________Michael Kennedy Bucker, MDClinical Fellow, PGY-6Hematology and Medical OncologyYNHH, [place]  Cancer CenterAttending Statement I have seen/evaluated the patient and agree with the H&P as documented by the Fellow Dr. Kennedy Bucker.  Mr. Gornick has developed worsening dyspnea on exertion along with orthopnea and bilateral lower extremity edema, concerning for decompensated heart failure.  Elevated BNP, troponin 0.02.  Concern for cancer progression on recent cardiac imaging although poor quality images so difficult to know for sure.  We discussed his case with the cardio-oncology team and we agreed to admit him for further workup and treatment of CHF.  Will plan to obtain repeat [place] scans to reassess his cancer once he is clinically improved.  He remains on prednisone for myocarditis, will need to start bactrim if the plan is for him to continue.Nikki Dom

## 2020-05-16 ENCOUNTER — Emergency Department: Admit: 2020-05-16 | Payer: PRIVATE HEALTH INSURANCE

## 2020-05-16 ENCOUNTER — Encounter: Admit: 2020-05-16 | Payer: PRIVATE HEALTH INSURANCE | Attending: Hematology

## 2020-05-16 ENCOUNTER — Encounter: Admit: 2020-05-16 | Payer: PRIVATE HEALTH INSURANCE | Attending: Nurse Practitioner

## 2020-05-16 ENCOUNTER — Telehealth: Admit: 2020-05-16 | Payer: PRIVATE HEALTH INSURANCE

## 2020-05-16 ENCOUNTER — Encounter: Admit: 2020-05-16 | Payer: PRIVATE HEALTH INSURANCE

## 2020-05-16 ENCOUNTER — Telehealth: Admit: 2020-05-16 | Payer: PRIVATE HEALTH INSURANCE | Attending: Hematology

## 2020-05-16 ENCOUNTER — Inpatient Hospital Stay: Admit: 2020-05-16 | Discharge: 2020-05-16 | Payer: PRIVATE HEALTH INSURANCE

## 2020-05-16 DIAGNOSIS — C3490 Malignant neoplasm of unspecified part of unspecified bronchus or lung: Secondary | ICD-10-CM

## 2020-05-16 DIAGNOSIS — Z7982 Long term (current) use of aspirin: Secondary | ICD-10-CM

## 2020-05-16 DIAGNOSIS — R319 Hematuria, unspecified: Secondary | ICD-10-CM

## 2020-05-16 DIAGNOSIS — E785 Hyperlipidemia, unspecified: Secondary | ICD-10-CM

## 2020-05-16 DIAGNOSIS — Z7902 Long term (current) use of antithrombotics/antiplatelets: Secondary | ICD-10-CM

## 2020-05-16 DIAGNOSIS — Z79899 Other long term (current) drug therapy: Secondary | ICD-10-CM

## 2020-05-16 DIAGNOSIS — I509 Heart failure, unspecified: Secondary | ICD-10-CM

## 2020-05-16 DIAGNOSIS — R1032 Left lower quadrant pain: Secondary | ICD-10-CM

## 2020-05-16 DIAGNOSIS — I2510 Atherosclerotic heart disease of native coronary artery without angina pectoris: Secondary | ICD-10-CM

## 2020-05-16 DIAGNOSIS — I110 Hypertensive heart disease with heart failure: Secondary | ICD-10-CM

## 2020-05-16 DIAGNOSIS — R109 Unspecified abdominal pain: Secondary | ICD-10-CM

## 2020-05-16 DIAGNOSIS — E119 Type 2 diabetes mellitus without complications: Secondary | ICD-10-CM

## 2020-05-16 DIAGNOSIS — Z9049 Acquired absence of other specified parts of digestive tract: Secondary | ICD-10-CM

## 2020-05-16 DIAGNOSIS — R300 Dysuria: Secondary | ICD-10-CM

## 2020-05-16 DIAGNOSIS — R1012 Left upper quadrant pain: Secondary | ICD-10-CM

## 2020-05-16 DIAGNOSIS — F17210 Nicotine dependence, cigarettes, uncomplicated: Secondary | ICD-10-CM

## 2020-05-16 LAB — CBC WITH AUTO DIFFERENTIAL
BKR WAM ABSOLUTE IMMATURE GRANULOCYTES: 0.1 x 1000/ÂµL (ref 0.0–0.3)
BKR WAM ABSOLUTE LYMPHOCYTE COUNT: 1.2 x 1000/ÂµL (ref 1.0–4.0)
BKR WAM ABSOLUTE NRBC: 0 x 1000/ÂµL (ref 0.0–0.0)
BKR WAM ANALYZER ANC: 13.2 x 1000/ÂµL — ABNORMAL HIGH (ref 1.0–11.0)
BKR WAM BASOPHIL ABSOLUTE COUNT: 0 x 1000/ÂµL (ref 0.0–0.0)
BKR WAM BASOPHILS: 0.1 % (ref 0.0–4.0)
BKR WAM EOSINOPHIL ABSOLUTE COUNT: 0 x 1000/ÂµL (ref 0.0–1.0)
BKR WAM EOSINOPHILS: 0.3 % (ref 0.0–7.0)
BKR WAM HEMATOCRIT: 36 % — ABNORMAL LOW (ref 37.0–52.0)
BKR WAM HEMOGLOBIN: 12 g/dL (ref 12.0–18.0)
BKR WAM IMMATURE GRANULOCYTES: 0.7 % (ref 0.0–3.0)
BKR WAM LYMPHOCYTES: 7.7 % — ABNORMAL LOW (ref 8.0–49.0)
BKR WAM MCH (PG): 32.2 pg — ABNORMAL HIGH (ref 27.0–31.0)
BKR WAM MCHC: 33.3 g/dL (ref 31.0–36.0)
BKR WAM MCV: 96.5 fL — ABNORMAL HIGH (ref 78.0–94.0)
BKR WAM MONOCYTE ABSOLUTE COUNT: 0.5 x 1000/??L (ref 0.0–2.0)
BKR WAM MONOCYTES: 3.2 % — ABNORMAL LOW (ref 4.0–15.0)
BKR WAM NEUTROPHILS: 88 % — ABNORMAL HIGH (ref 37.0–84.0)
BKR WAM NUCLEATED RED BLOOD CELLS: 0 % (ref 0.0–1.0)
BKR WAM PLATELETS: 177 x1000/ÂµL (ref 140–440)
BKR WAM RDW-CV: 15.6 % — ABNORMAL HIGH (ref 11.5–14.5)
BKR WAM RED BLOOD CELL COUNT: 3.7 M/??L — ABNORMAL LOW (ref 3.8–5.9)
BKR WAM WHITE BLOOD CELL COUNT: 15 x1000/??L — ABNORMAL HIGH (ref 4.0–10.0)

## 2020-05-16 LAB — URINALYSIS-MACROSCOPIC W/REFLEX MICROSCOPIC
BKR BILIRUBIN, UA: NEGATIVE %
BKR GLUCOSE, UA: NEGATIVE /HPF — AB (ref 0.0–1.0)
BKR KETONES, UA: NEGATIVE % — AB (ref 0.0–4.0)
BKR LEUKOCYTE ESTERASE, UA: POSITIVE x 1000/??L — AB (ref 0.0–0.3)
BKR NITRITE, UA: NEGATIVE
BKR PH, UA: 6 (ref 5.5–7.5)
BKR SPECIFIC GRAVITY, UA: 1.018 (ref 1.005–1.030)
BKR UROBILINOGEN, UA: <2 EU/dL (ref <=2.0)

## 2020-05-16 LAB — URINE MICROSCOPIC     (BH GH LMW YH)

## 2020-05-16 LAB — BASIC METABOLIC PANEL
BKR ANION GAP: 9 % — ABNORMAL HIGH (ref 7–17)
BKR BLOOD UREA NITROGEN: 35 mg/dL — ABNORMAL HIGH (ref 8–23)
BKR BUN / CREAT RATIO: 24 — ABNORMAL HIGH (ref 8.0–23.0)
BKR CALCIUM: 8.3 mg/dL — ABNORMAL LOW (ref 8.8–10.2)
BKR CHLORIDE: 107 mmol/L (ref 98–107)
BKR CO2: 24 mmol/L (ref 20–30)
BKR CREATININE: 1.46 mg/dL — ABNORMAL HIGH (ref 0.40–1.30)
BKR EGFR (AFR AMER): 58 mL/min/{1.73_m2} (ref >60)
BKR EGFR (NON AFRICAN AMERICAN): 48 mL/min/1.73m2 — ABNORMAL LOW (ref >60)
BKR GLUCOSE: 232 mg/dL — ABNORMAL HIGH (ref 70–100)
BKR POTASSIUM: 4 mmol/L (ref 3.3–5.1)
BKR SODIUM: 140 mmol/L (ref 136–144)

## 2020-05-16 LAB — NT-PROBNPE: BKR B-TYPE NATRIURETIC PEPTIDE, PRO (PROBNP): 642 pg/mL — ABNORMAL HIGH (ref <125.0)

## 2020-05-16 LAB — TROPONIN T     (Q): BKR TROPONIN T: 0.02 ng/mL — ABNORMAL HIGH (ref <0.01)

## 2020-05-16 MED ORDER — LACTATED RINGERS IV BOLUS (NEW BAG)
Freq: Once | INTRAVENOUS | Status: CP
Start: 2020-05-16 — End: ?
  Administered 2020-05-16: 16:00:00 1000.000 mL/h via INTRAVENOUS

## 2020-05-16 MED ORDER — OXYCODONE IMMEDIATE RELEASE 5 MG TABLET
5 mg | ORAL_TABLET | ORAL | 1 refills | Status: AC | PRN
Start: 2020-05-16 — End: 2020-06-04

## 2020-05-16 MED ORDER — MORPHINE 4 MG/ML INTRAVENOUS SOLUTION
4 mg/mL | Freq: Once | INTRAVENOUS | Status: CP
Start: 2020-05-16 — End: ?
  Administered 2020-05-16: 16:00:00 4 mL via INTRAVENOUS

## 2020-05-16 MED ORDER — SODIUM CHLORIDE 0.9 % BOLUS (NEW BAG)
0.9 % | Freq: Once | INTRAVENOUS | Status: CP | PRN
Start: 2020-05-16 — End: ?
  Administered 2020-05-16: 17:00:00 0.9 mL/h via INTRAVENOUS

## 2020-05-16 MED ORDER — IOHEXOL 350 MG IODINE/ML INTRAVENOUS SOLUTION
350 mg iodine/mL | Freq: Once | INTRAVENOUS | Status: CP | PRN
Start: 2020-05-16 — End: ?
  Administered 2020-05-16: 17:00:00 350 mL via INTRAVENOUS

## 2020-05-16 NOTE — Discharge Instructions
You will need to follow up with your urology team at her upcoming appointments this week as well as your pulmonology team for surveillance of your non small cell lung cancer in addition to Cardiology in regards to trending of your troponin levels, BNP, ECG and follow-up of your cardiomyopathy.

## 2020-05-16 NOTE — ED Notes
11:16 AM Cathey Endow, RNPt arrives from triage c/o severe lower abd pain since this AM. Pt has foley in since Saturday d/t retention, and has been having hematuria since. Was instructed to come to ED if he started having worsening pain. Denies fevers/chills or chest pain. A&OX4, speaking in full sentences. Resp even non labored. Pending provider eval.Past Medical History: Diagnosis Date ? Anxiety  ? CAD (coronary artery disease)  ? Diabetes mellitus (HC Code)  ? GERD (gastroesophageal reflux disease)  ? Hyperlipidemia  ? Hypertension  ? Hypertrophy of prostate without urinary obstruction and other lower urinary tract symptoms (LUTS)  ? Other testicular hypofunction  ? Spinal stenosis 09/28/2018 ? Tobacco abuse  ? Vitamin D deficiency  4:04 PMPt resting on stretcher, no acute distress at this time. Pending [place] results. 5:03 PMPt given d/c instructions and verbalized understanding of follow-up. Pt ambulatory to lobby w/ steady gait. No acute distress upon departure from ED. Care complete.

## 2020-05-16 NOTE — Telephone Encounter
Recvd call from [place]. Paul Hunter was at the hospital on Saturday w urinary issues. He is having cramoing again today and [name] wa calling to letnus know he will be going back to the ECC. Was going to call to get that number. Also will call his urologist.[name]

## 2020-05-16 NOTE — Telephone Encounter
RTC to [place]. She states that [place] is in severe pain and he is having trouble voiding.  Was just discharged from the hospital and is requiring urgent assistance.  She states that the [facility] is full.  Informed her to tell him to go to the ED at [facility] for help.

## 2020-05-16 NOTE — Telephone Encounter
Pt wife called stating he went to ER last night, was DC & went home.  He is in terrible pain & wanted to go back to ER but they are full.  Wants to know where she should take himProvider is Dr Nicky Pugh

## 2020-05-16 NOTE — Telephone Encounter
Called and LVM for patient to advise of appointment scheduled for 05/19/20 @ 2:30p with Dr. Meryl Dare. LM to contact our office to confirm. [facility] also sent to patient.

## 2020-05-17 ENCOUNTER — Ambulatory Visit: Admit: 2020-05-17 | Payer: PRIVATE HEALTH INSURANCE | Attending: Surgical

## 2020-05-17 ENCOUNTER — Telehealth: Admit: 2020-05-17 | Payer: PRIVATE HEALTH INSURANCE | Attending: Urology

## 2020-05-17 ENCOUNTER — Encounter: Admit: 2020-05-17 | Payer: PRIVATE HEALTH INSURANCE | Attending: Urology

## 2020-05-17 ENCOUNTER — Inpatient Hospital Stay: Admit: 2020-05-17 | Discharge: 2020-05-17 | Payer: PRIVATE HEALTH INSURANCE

## 2020-05-17 DIAGNOSIS — R319 Hematuria, unspecified: Secondary | ICD-10-CM

## 2020-05-17 DIAGNOSIS — C3490 Malignant neoplasm of unspecified part of unspecified bronchus or lung: Secondary | ICD-10-CM

## 2020-05-17 NOTE — Telephone Encounter
Patient was called and late results were discussed. Patient has f/u and was appreciative of the call. Patient also has Cardiology f/u.

## 2020-05-17 NOTE — Telephone Encounter
No PCP noted on file for pt. Call placed to pt, and no PCP verified. Pt was willing to receive an emailed list of primary care providers accepting new patients. Email confirmed, updated, and information sent.  He intends to f/u with his other providers as well.  Pt was appreciative of the assistance and call.

## 2020-05-17 NOTE — Progress Notes
Paul Hunter is here for a catheter check.  He has been having bladder spasms about 1-2 times a day.  He finds them very uncomfortable.  Foley catheter draining well.  Light red in color, but clear without clots.  Bladder scan 0 ml.  He was in the ED with left lower abdominal pain yesterday that he improved significantly.  He describes mild twinges in that location infrequently today.  He has not been hydrating well.  We discussed the importance of hydration with hematuria and catheter.  He will work on this.  We discussed proper positioning of the catheter so it will drain the best.  Discussed with PA in clinic who recommended increasing oxybutynin dose to 10 mg daily.  Discussed risks and benefits.  Will follow up with Dr. Meryl Dare on Thursday.

## 2020-05-17 NOTE — Telephone Encounter
Contacted Paul Hunter.  Paul Hunter had a [place] abd/pelvis with contrast yesterday in the emergency department and has a [place] chest scheduled for today.  He also has a nephrogram scheduled for Friday.  Paul Hunter was calling to ask what imaging was needed.  Discussed with PA in office.  Will proceed with [place] chest today and keep [place] nephrogram appointment for Friday, but that may be cancelled depending on results with Dr. Meryl Dare on Thursday.Attempted to call Paul Hunter regarding his MyChart message.  Will try writing back to ask if he wants to come to [facility] to have it checked this afternoon after his [place].

## 2020-05-17 NOTE — Telephone Encounter
Irving Burton APRN from [facility] thoracstic oncology called in. She would like to discuss imaging orders. Please call at [phone]-Paul Hunter

## 2020-05-18 ENCOUNTER — Encounter: Admit: 2020-05-18 | Payer: PRIVATE HEALTH INSURANCE

## 2020-05-18 ENCOUNTER — Telehealth: Admit: 2020-05-18 | Payer: PRIVATE HEALTH INSURANCE | Attending: Urology

## 2020-05-18 NOTE — Telephone Encounter
Message to care center to reschedule.

## 2020-05-19 ENCOUNTER — Emergency Department: Admit: 2020-05-19 | Payer: PRIVATE HEALTH INSURANCE

## 2020-05-19 ENCOUNTER — Emergency Department: Admit: 2020-05-19 | Payer: PRIVATE HEALTH INSURANCE | Attending: Diagnostic Radiology

## 2020-05-19 ENCOUNTER — Encounter: Admit: 2020-05-19 | Payer: PRIVATE HEALTH INSURANCE | Attending: Nurse Practitioner

## 2020-05-19 ENCOUNTER — Encounter: Admit: 2020-05-19 | Payer: PRIVATE HEALTH INSURANCE | Attending: Urology

## 2020-05-19 ENCOUNTER — Encounter: Admit: 2020-05-19 | Payer: PRIVATE HEALTH INSURANCE | Attending: Hematology

## 2020-05-19 ENCOUNTER — Telehealth: Admit: 2020-05-19 | Payer: PRIVATE HEALTH INSURANCE | Attending: Urology

## 2020-05-19 ENCOUNTER — Inpatient Hospital Stay: Admit: 2020-05-19 | Discharge: 2020-05-20 | Payer: PRIVATE HEALTH INSURANCE

## 2020-05-19 ENCOUNTER — Ambulatory Visit: Admit: 2020-05-19 | Payer: PRIVATE HEALTH INSURANCE | Attending: Urology

## 2020-05-19 ENCOUNTER — Emergency Department: Admit: 2020-05-19 | Payer: PRIVATE HEALTH INSURANCE | Attending: Urology

## 2020-05-19 ENCOUNTER — Inpatient Hospital Stay: Admit: 2020-05-19 | Discharge: 2020-05-19 | Payer: PRIVATE HEALTH INSURANCE

## 2020-05-19 DIAGNOSIS — M4800 Spinal stenosis, site unspecified: Secondary | ICD-10-CM

## 2020-05-19 DIAGNOSIS — T839XXA Unspecified complication of genitourinary prosthetic device, implant and graft, initial encounter: Secondary | ICD-10-CM

## 2020-05-19 DIAGNOSIS — R319 Hematuria, unspecified: Principal | ICD-10-CM

## 2020-05-19 DIAGNOSIS — R0602 Shortness of breath: Secondary | ICD-10-CM

## 2020-05-19 DIAGNOSIS — Z720 Tobacco use: Secondary | ICD-10-CM

## 2020-05-19 DIAGNOSIS — E119 Type 2 diabetes mellitus without complications: Secondary | ICD-10-CM

## 2020-05-19 DIAGNOSIS — E559 Vitamin D deficiency, unspecified: Secondary | ICD-10-CM

## 2020-05-19 DIAGNOSIS — I2510 Atherosclerotic heart disease of native coronary artery without angina pectoris: Secondary | ICD-10-CM

## 2020-05-19 DIAGNOSIS — E785 Hyperlipidemia, unspecified: Secondary | ICD-10-CM

## 2020-05-19 DIAGNOSIS — E291 Testicular hypofunction: Secondary | ICD-10-CM

## 2020-05-19 DIAGNOSIS — N400 Enlarged prostate without lower urinary tract symptoms: Secondary | ICD-10-CM

## 2020-05-19 DIAGNOSIS — F419 Anxiety disorder, unspecified: Secondary | ICD-10-CM

## 2020-05-19 DIAGNOSIS — K219 Gastro-esophageal reflux disease without esophagitis: Secondary | ICD-10-CM

## 2020-05-19 DIAGNOSIS — R339 Retention of urine, unspecified: Secondary | ICD-10-CM

## 2020-05-19 DIAGNOSIS — I10 Essential (primary) hypertension: Secondary | ICD-10-CM

## 2020-05-19 LAB — URINE MICROSCOPIC     (BH GH LMW YH)

## 2020-05-19 LAB — URINALYSIS-MACROSCOPIC W/REFLEX MICROSCOPIC
BKR BILIRUBIN, UA: NEGATIVE % (ref 8.0–49.0)
BKR GLUCOSE, UA: NEGATIVE
BKR KETONES, UA: NEGATIVE x 1000/??L — AB (ref 1.0–11.0)
BKR LEUKOCYTE ESTERASE, UA: POSITIVE — AB
BKR NITRITE, UA: NEGATIVE % (ref 4.0–15.0)
BKR PH, UA: 6 (ref 5.5–7.5)
BKR SPECIFIC GRAVITY, UA: 1.019 (ref 1.005–1.030)
BKR UROBILINOGEN, UA: <2 EU/dL (ref <=2.0)
BKR WAM MONOCYTE ABSOLUTE COUNT: <2 EU/dL (ref <=2.0)

## 2020-05-19 LAB — CBC WITH AUTO DIFFERENTIAL
BKR MANUAL MICROSCOPIC: 33.8 g/dL (ref 31.0–36.0)
BKR WAM ABSOLUTE IMMATURE GRANULOCYTES: 0.2 x 1000/??L (ref 0.0–0.3)
BKR WAM ABSOLUTE LYMPHOCYTE COUNT: 2.8 x 1000/??L — AB (ref 1.0–4.0)
BKR WAM ABSOLUTE NRBC: 0 x 1000/??L (ref 0.0–0.0)
BKR WAM ANALYZER ANC: 10.3 x 1000/ÂµL (ref 1.0–11.0)
BKR WAM BASOPHIL ABSOLUTE COUNT: 0 x 1000/??L (ref 0.0–0.0)
BKR WAM BASOPHILS: 0.2 % (ref 0.0–4.0)
BKR WAM EOSINOPHIL ABSOLUTE COUNT: 0.1 x 1000/??L (ref 0.0–1.0)
BKR WAM EOSINOPHILS: 0.7 % (ref 0.0–7.0)
BKR WAM HEMATOCRIT: 34.9 % — ABNORMAL LOW (ref 37.0–52.0)
BKR WAM HEMOGLOBIN: 11.8 g/dL — ABNORMAL LOW (ref 12.0–18.0)
BKR WAM IMMATURE GRANULOCYTES: 1.1 % (ref 0.0–3.0)
BKR WAM LYMPHOCYTES: 19.7 % (ref 8.0–49.0)
BKR WAM MCH (PG): 32.2 pg — ABNORMAL HIGH (ref 27.0–31.0)
BKR WAM MCHC: 33.8 g/dL (ref 31.0–36.0)
BKR WAM MCV: 95.4 fL — ABNORMAL HIGH (ref 78.0–94.0)
BKR WAM MONOCYTES: 5.4 % (ref 4.0–15.0)
BKR WAM MPV: 9.6 fL (ref 6.0–11.0)
BKR WAM NEUTROPHILS: 72.9 % (ref 37.0–84.0)
BKR WAM NUCLEATED RED BLOOD CELLS: 0 % (ref 0.0–1.0)
BKR WAM PLATELETS: 184 x1000/ÂµL (ref 140–440)
BKR WAM RDW-CV: 15.5 % — ABNORMAL HIGH (ref 11.5–14.5)
BKR WAM RED BLOOD CELL COUNT: 3.7 M/??L — ABNORMAL LOW (ref 3.8–5.9)
BKR WAM WHITE BLOOD CELL COUNT: 14.1 x1000/??L — ABNORMAL HIGH (ref 4.0–10.0)

## 2020-05-19 LAB — BASIC METABOLIC PANEL
BKR ANION GAP: 12 (ref 7–17)
BKR BLOOD UREA NITROGEN: 34 mg/dL — ABNORMAL HIGH (ref 8–23)
BKR BUN / CREAT RATIO: 22.7 (ref 8.0–23.0)
BKR CALCIUM: 8.3 mg/dL — ABNORMAL LOW (ref 8.8–10.2)
BKR CHLORIDE: 104 mmol/L (ref 98–107)
BKR CO2: 23 mmol/L (ref 20–30)
BKR CREATININE: 1.5 mg/dL — ABNORMAL HIGH (ref 0.40–1.30)
BKR EGFR (AFR AMER): 57 mL/min/{1.73_m2} (ref >60)
BKR EGFR (NON AFRICAN AMERICAN): 47 mL/min/{1.73_m2} (ref >60)
BKR GLUCOSE: 155 mg/dL — ABNORMAL HIGH (ref 70–100)
BKR POTASSIUM: 3.8 mmol/L (ref 3.3–5.1)
BKR SODIUM: 139 mmol/L (ref 136–144)

## 2020-05-19 MED ORDER — LIDOCAINE 2 % MUCOSAL JELLY IN APPLICATOR
2 % | Freq: Once | URETHRAL | Status: DC
Start: 2020-05-19 — End: 2020-05-20
  Administered 2020-05-19: 19:00:00 2 mL via URETHRAL

## 2020-05-19 MED ORDER — FOSFOMYCIN TROMETHAMINE 3 GRAM ORAL PACKET
3 gram | Freq: Once | ORAL | Status: DC
Start: 2020-05-19 — End: 2020-05-20
  Administered 2020-05-19: 19:00:00 3 gram via ORAL

## 2020-05-19 MED ORDER — HYDROMORPHONE 2 MG/ML INJECTION SOLUTION
2 mg/mL | Freq: Once | INTRAVENOUS | Status: CP
Start: 2020-05-19 — End: ?
  Administered 2020-05-20: 03:00:00 2 mL via INTRAVENOUS

## 2020-05-19 MED ORDER — MORPHINE 4 MG/ML INTRAVENOUS SOLUTION
4 mg/mL | Freq: Once | INTRAVENOUS | Status: CP
Start: 2020-05-19 — End: ?
  Administered 2020-05-19: 17:00:00 4 mL via INTRAVENOUS

## 2020-05-19 MED ORDER — MORPHINE 4 MG/ML INTRAVENOUS SOLUTION
4 mg/mL | Freq: Once | INTRAVENOUS | Status: CP
Start: 2020-05-19 — End: ?
  Administered 2020-05-20: 02:00:00 4 mL via INTRAVENOUS

## 2020-05-19 NOTE — Other
-    CONSULT  REQUEST  DOCUMENTATION  -  [facility] NOTE  -  Type of consult: [facility] Urology   -  New Consult: [id number] Onalee Hua J Stief /Location: B03/B03 / *Brief Clinical Question: hematuria adult [facility] for Consult? hematuria/Callback Cell Phone: [phone]** / Please confirm receipt of this message by texting back ?OK?  -  2 - Smartweb Page sent to Urology [facility] Days 2343 at 12:09 PM.  Casimiro Needle Ellie Bryand  05/19/2020  12:09 PM  Consult [facility] [phone]

## 2020-05-19 NOTE — Telephone Encounter
This writer lvm to make patient aware of scheduled visit with Dr. Meryl Dare in October for 7:45am. please cofirm with patient. Thanks.

## 2020-05-19 NOTE — ED Notes
10:36 AM patient presents CC catheter problemPatient states for weeks he has seen hematuria in his catheter with clots. Now he is presenting with less urine output as well as increasing clots. Patient also with new abdominal pain approx 1 hr PTA in pubic region. Catheter bag present with dark red urine. Catheter appears patent. Bladder scan with no urine present in bladder. Patient reporting pain is 5/10. No other complaints at this time.Past Medical History: Diagnosis Date ? Anxiety  ? CAD (coronary artery disease)  ? Diabetes mellitus (HC Code)  ? GERD (gastroesophageal reflux disease)  ? Hyperlipidemia  ? Hypertension  ? Hypertrophy of prostate without urinary obstruction and other lower urinary tract symptoms (LUTS)  ? Other testicular hypofunction  ? Spinal stenosis 09/28/2018 ? Tobacco abuse  ? Vitamin D deficiency

## 2020-05-19 NOTE — ED Notes
11:16 AM Eulogio Ditch, RNAssumed care of pt, report from Adam RN.1:12 PMUrology at bedside.1:36 PMPt given d/c instructions and verbalized understanding of follow-up care with urology clinic. Ambulatory to lobby w/ steady gait.Pt in wheelchair to lobby to wait for ride.

## 2020-05-19 NOTE — Telephone Encounter
Spoke with Onalee Hua who states he has not urinated in the foley bag since 5am and has lots of clots in the bag, pt has pressure to void but not in pain yet.  Dr.Devito will see pt if he comes to [facility] right now but pt does not have a ride for 1-2 hrs and it takes 30 minutes to get here from his house, discussed with Dr.Devito again who states pt should call 911 and to go to ER.  Pt called again and relayed Dr.devito's message that pt should proceed to ER, pt told to call 911 so he can get to ER right away and verbalized understanding.

## 2020-05-19 NOTE — Telephone Encounter
Paul Hunter is scheduled for a cystoscopy today at 2 pm with Dr. Meryl Dare.He has a foley catheter in place and states that it is currently occluded since 5 am. Per patient, this has happened in the past and it tends to clear on its own, but this time it will not clear. He has been passing blood clots. He is currently feeling pressure to void and cannot. Denies any other symptoms.Called nurse manager at [facility] who will speak to the MD and have a nurse in the office call the patient back. Paul Hunter verbalized understanding of plan and will wait for a call back soon.

## 2020-05-19 NOTE — Patient Instructions
Cystoscopy, Care After  Refer to this sheet in the next few weeks. These instructions provide you with information about caring for yourself after your procedure. Your health care provider may also give you more specific instructions. Your treatment has been planned according to current medical practices, but problems sometimes occur. Call your health care provider if you have any problems or questions after your procedure.  What can I expect after the procedure?  After the procedure, it is common to have:  ? Mild pain when you urinate. Pain should stop within a few minutes after you urinate. This may last for up to 1 week.  ? A small amount of blood in your urine for several days.  ? Feeling like you need to urinate but producing only a small amount of urine.    Follow these instructions at home:    Medicines  ? Take over-the-counter and prescription medicines only as told by your health care provider.  ? If you were prescribed an antibiotic medicine, take it as told by your health care provider. Do not stop taking the antibiotic even if you start to feel better.  General instructions    ? Return to your normal activities as told by your health care provider. Ask your health care provider what activities are safe for you.  ? Do not drive for 24 hours if you received a sedative.  ? Watch for any blood in your urine. If the amount of blood in your urine increases, call your health care provider.  ? Follow instructions from your health care provider about eating or drinking restrictions.  ? If a tissue sample was removed for testing (biopsy) during your procedure, it is your responsibility to get your test results. Ask your health care provider or the department performing the test when your results will be ready.  ? Drink enough fluid to keep your urine clear or pale yellow.  ? Keep all follow-up visits as told by your health care provider. This is important.  Contact a health care provider if:  ? You have pain that gets worse or does not get better with medicine, especially pain when you urinate.  ? You have difficulty urinating.  Get help right away if:  ? You have more blood in your urine.  ? You have blood clots in your urine.  ? You have abdominal pain.  ? You have a fever or chills.  ? You are unable to urinate.  This information is not intended to replace advice given to you by your health care provider. Make sure you discuss any questions you have with your health care provider.  Document Released: 03/02/2005 Document Revised: 01/19/2016 Document Reviewed: 06/30/2015  Elsevier Interactive Patient Education ? 2019 [employer]      #63fr coude foley catheter  Indwelling Urinary Catheter Care, Adult  An indwelling urinary catheter is a thin, flexible, germ-free (sterile) tube that is placed into the bladder to help drain urine out of the body. The catheter is inserted into the part of the body that drains urine from the bladder (urethra). Urine drains from the catheter into a drainage bag outside of the body.  Taking good care of your catheter will keep it working properly and help to prevent problems from developing.  What are the risks?  ? Bacteria may get into your bladder and cause a urinary tract infection.  ? Urine flow can become blocked. This can happen if the catheter is not working correctly, or if you  have sediment or a blood clot in your bladder or the catheter.  ? Tissue near the catheter may become irritated and bleed.  How to wear your catheter and your drainage bag  Supplies needed  ? Adhesive tape or a leg strap.  ? Alcohol wipe or soap and water (if you use tape).  ? A clean towel (if you use tape).  ? Overnight drainage bag.  ? Smaller drainage bag (leg bag).  Wearing your catheter and bag  Use adhesive tape or a leg strap to attach your catheter to your leg.  ? Make sure the catheter is not pulled tight.  ? If a leg strap gets wet, replace it with a dry one.  ? If you use adhesive tape:  1. Use an alcohol wipe or soap and water to wash off any stickiness on your skin where you had tape before.  2. Use a clean towel to pat-dry the area.  3. Apply the new tape.  You should have received a large overnight drainage bag and a smaller leg bag that fits underneath clothing.  ? You may wear the overnight bag at any time, but you should not wear the leg bag at night.  ? Always wear the leg bag below your knee.  ? Make sure the overnight drainage bag is always lower than the level of your bladder, but do not let it touch the floor. Before you go to sleep, hang the bag inside a wastebasket that is covered by a clean plastic bag.  How to care for your skin around the catheter         Supplies needed  ? A clean washcloth.  ? Water and mild soap.  ? A clean towel.  Caring for your skin and catheter  ? Every day, use a clean washcloth and soapy water to clean the skin around your catheter.  1. Wash your hands with soap and water.  2. Wet a washcloth in warm water and mild soap.  3. Clean the skin around your urethra.  ? If you are male:  ? Use one hand to gently spread the folds of skin around your vagina (labia).  ? With the washcloth in your other hand, wipe the inner side of your labia on each side. Do this in a front-to-back direction.  ? If you are male:  ? Use one hand to pull back any skin that covers the end of your penis (foreskin).  ? With the washcloth in your other hand, wipe your penis in small circles. Start wiping at the tip of your penis, then move outward from the catheter.  ? Move the foreskin back in place, if this applies.  4. With your free hand, hold the catheter close to where it enters your body. Keep holding the catheter during cleaning so it does not get pulled out.  5. Use your other hand to clean the catheter with the washcloth.  ? Only wipe downward on the catheter.  ? Do not wipe upward toward your body, because that may push bacteria into your urethra and cause infection.  6. Use a clean towel to pat-dry the catheter and the skin around it. Make sure to wipe off all soap.  7. Wash your hands with soap and water.  ? Shower every day. Do not take baths.  ? Do not use cream, ointment, or lotion on the area where the catheter enters your body, unless your health care provider tells you to do that.  ?  Do not use powders, sprays, or lotions on your genital area.  ? Check your skin around the catheter every day for signs of infection. Check for:  ? Redness, swelling, or pain.  ? Fluid or blood.  ? Warmth.  ? Pus or a bad smell.  How to empty the drainage bag  Supplies needed  ? Rubbing alcohol.  ? Gauze pad or cotton ball.  ? Adhesive tape or a leg strap.  Emptying the bag  Empty your drainage bag (your overnight drainage bag or your leg bag) when it is ??? full, or at least 2?3 times a day. Clean the drainage bag according to the manufacturer's instructions or as told by your health care provider.  1. Wash your hands with soap and water.  2. Detach the drainage bag from your leg.  3. Hold the drainage bag over the toilet or a clean container. Make sure the drainage bag is lower than your hips and bladder. This stops urine from going back into the tubing and into your bladder.  4. Open the pour spout at the bottom of the bag.  5. Empty the urine into the toilet or container. Do not let the pour spout touch any surface. This precaution is important to prevent bacteria from getting in the bag and causing infection.  6. Apply rubbing alcohol to a gauze pad or cotton ball.  7. Use the gauze pad or cotton ball to clean the pour spout.  8. Close the pour spout.  9. Attach the bag to your leg with adhesive tape or a leg strap.  10. Wash your hands with soap and water.  How to change the drainage bag  Supplies needed:  ? Alcohol wipes.  ? A clean drainage bag.  ? Adhesive tape or a leg strap.  Changing the bag  Replace your drainage bag with a clean bag if it leaks, starts to smell bad, or looks dirty.  1. Wash your hands with soap and water.  2. Detach the dirty drainage bag from your leg.  3. Pinch the catheter with your fingers so that urine does not spill out.  4. Disconnect the catheter tube from the drainage tube at the connection valve. Do not let the tubes touch any surface.  5. Clean the end of the catheter tube with an alcohol wipe. Use a different alcohol wipe to clean the end of the drainage tube.  6. Connect the catheter tube to the drainage tube of the clean bag.  7. Attach the clean bag to your leg with adhesive tape or a leg strap. Avoid attaching the new bag too tightly.  8. Wash your hands with soap and water.  General instructions    ? Never pull on your catheter or try to remove it. Pulling can damage your internal tissues.  ? Always wash your hands before and after you handle your catheter or drainage bag. Use a mild, fragrance-free soap. If soap and water are not available, use hand sanitizer.  ? Always make sure there are no twists or bends (kinks) in the catheter tube.  ? Always make sure there are no leaks in the catheter or drainage bag.  ? Drink enough fluid to keep your urine pale yellow.  ? Do not take baths, swim, or use a hot tub.  ? If you are male, wipe from front to back after having a bowel movement.  Contact a health care provider if:  ? Your urine is cloudy.  ? Your  urine smells unusually bad.  ? Your catheter gets clogged.  ? Your catheter starts to leak.  ? Your bladder feels full.  Get help right away if:  ? You have redness, swelling, or pain where the catheter enters your body.  ? You have fluid, blood, pus, or a bad smell coming from the area where the catheter enters your body.  ? The area where the catheter enters your body feels warm to the touch.  ? You have a fever.  ? You have pain in your abdomen, legs, lower back, or bladder.  ? You see blood in the catheter.  ? Your urine is pink or red.  ? You have nausea, vomiting, or chills.  ? Your urine is not draining into the bag.  ? Your catheter gets pulled out.  Summary  ? An indwelling urinary catheter is a thin, flexible, germ-free (sterile) tube that is placed into the bladder to help drain urine out of the body.  ? The catheter is inserted into the part of the body that drains urine from the bladder (urethra).  ? Take good care of your catheter to keep it working properly and help prevent problems from developing.  ? Always wash your hands before and after you handle your catheter or drainage bag.  ? Never pull on your catheter or try to remove it.  This information is not intended to replace advice given to you by your health care provider. Make sure you discuss any questions you have with your health care provider.  Document Revised: 10/19/2019 Document Reviewed: 03/29/2017  Elsevier Patient Education ? 2021 Elsevier Inc.

## 2020-05-19 NOTE — Progress Notes
Urology Progress Note    Admit Date: 05/19/2020  Hospital Day: 1    Paul Hunter is a 69 y.o. male with a PMHx of NSCLC, (currently on carbo/taxol/pembro every 3 weeks), HLD, HTN, CAD (2 Mis, 3 stents) and sleep apnea on CPAP who presents for acute on chronic dyspnea, recently admitted to medicine for acute decompensated heart failure 9/13 -9/16, who presents now for hematuria and lower abdominal pain. He presented 05/16/20 for similar issues with his foley and hematuria but was discharged.[place] from 9/20 demonstrated new mild L hydro but otherwise no acute GU pathology.     On evaluation his urine is yellow to light red, no clots. He has a scheduled cystoscopy for his hematuria workup today with Dr Meryl Dare. He has been afebrile, HDS. WBC 14, Cr stable at 1.5.      Objective:     Vitals   Temp:  [97.4 ?F (36.3 ?C)]   Pulse:  [84]   Resp:  [18]   BP: (183)/(79)     Ins and Outs  No intake/output data recorded.    Physical Exam  Gen: Pt lying in bed comfortably, NAD  Resp: nonlabored breathing  CV: RR on monitor  GU: foley in place with yellow to light red urine, no clots noted            Labs:  WBC/Hgb/Hct/Plts:  14.1/11.8/34.9/184 (09/23 1216)   Na/K/Cl/CO2:  139/3.8/104/23 (09/23 1216)  BUN/Cr/GLU/ALT/AST/AMYLASE/LIPASE:  34/1.50/155/--/--/--/-- (09/23 1216)    Diagnostics:  No results found.    Assessment/Plan:     Paul Hunter is a 69 y.o. male with a PMHx of NSCLC, (currently on carbo/taxol/pembro every 3 weeks), HLD, HTN, CAD (2 Mis, 3 stents) and sleep apnea on CPAP who presents for acute on chronic dyspnea, recently admitted to medicine for acute decompensated heart failure 9/13 -9/16, who presents now for hematuria and lower abdominal pain. His foley is in place with mild hematuria and draining.     ? Discussed void trial vs upsizing foley  ? Ultimately decided best course was to discharge to office cysto for completion of hematuria workup. Discussed that void trial could be completed at office as well and if able to void may be the best option to control issues with hematuria.  ? Cysto appointment with Dr Meryl Dare today      Judie Bonus, MD   05/19/2020   1:19 PM

## 2020-05-19 NOTE — Discharge Instructions
A copy of your tests is provided with your discharge papers. If you have any problems with your prescriptions, or any problems after discharge, there is a nurse available Monday through Friday from 7:00am-3:30pm at [phone] to help answer any questions. You can also call the Emergency Department directly at [phone].Please follow up with your primary care doctor on the next business day.If you develop fever not controlled by tylenol/ibuprofen, confusion or strange behavior, new/increased pain, difficulty breathing, uncontrollable vomiting, or any other new or concerning symptoms, please return to the emergency department immediately for further evaluation.  We are always open.Thank you for allowing [place] to take care of you. We hope that you feel better soon.

## 2020-05-20 ENCOUNTER — Ambulatory Visit: Admit: 2020-05-20 | Payer: PRIVATE HEALTH INSURANCE | Attending: Nurse Practitioner

## 2020-05-20 ENCOUNTER — Ambulatory Visit: Admit: 2020-05-20 | Payer: PRIVATE HEALTH INSURANCE

## 2020-05-20 ENCOUNTER — Encounter
Admit: 2020-05-20 | Payer: PRIVATE HEALTH INSURANCE | Attending: Student in an Organized Health Care Education/Training Program

## 2020-05-20 ENCOUNTER — Encounter: Admit: 2020-05-20 | Payer: PRIVATE HEALTH INSURANCE | Attending: Nurse Practitioner

## 2020-05-20 ENCOUNTER — Telehealth
Admit: 2020-05-20 | Payer: PRIVATE HEALTH INSURANCE | Attending: Student in an Organized Health Care Education/Training Program

## 2020-05-20 ENCOUNTER — Emergency Department: Admit: 2020-05-20 | Payer: PRIVATE HEALTH INSURANCE

## 2020-05-20 DIAGNOSIS — Z955 Presence of coronary angioplasty implant and graft: Secondary | ICD-10-CM

## 2020-05-20 DIAGNOSIS — K219 Gastro-esophageal reflux disease without esophagitis: Secondary | ICD-10-CM

## 2020-05-20 DIAGNOSIS — I10 Essential (primary) hypertension: Secondary | ICD-10-CM

## 2020-05-20 DIAGNOSIS — Z7982 Long term (current) use of aspirin: Secondary | ICD-10-CM

## 2020-05-20 DIAGNOSIS — N183 Chronic kidney disease, stage 3 unspecified: Secondary | ICD-10-CM

## 2020-05-20 DIAGNOSIS — I129 Hypertensive chronic kidney disease with stage 1 through stage 4 chronic kidney disease, or unspecified chronic kidney disease: Secondary | ICD-10-CM

## 2020-05-20 DIAGNOSIS — N1330 Unspecified hydronephrosis: Secondary | ICD-10-CM

## 2020-05-20 DIAGNOSIS — C3490 Malignant neoplasm of unspecified part of unspecified bronchus or lung: Secondary | ICD-10-CM

## 2020-05-20 DIAGNOSIS — G4730 Sleep apnea, unspecified: Secondary | ICD-10-CM

## 2020-05-20 DIAGNOSIS — Z5181 Encounter for therapeutic drug level monitoring: Secondary | ICD-10-CM

## 2020-05-20 DIAGNOSIS — E785 Hyperlipidemia, unspecified: Secondary | ICD-10-CM

## 2020-05-20 DIAGNOSIS — Z20822 Contact with and (suspected) exposure to covid-19: Secondary | ICD-10-CM

## 2020-05-20 DIAGNOSIS — I2510 Atherosclerotic heart disease of native coronary artery without angina pectoris: Secondary | ICD-10-CM

## 2020-05-20 DIAGNOSIS — T839XXA Unspecified complication of genitourinary prosthetic device, implant and graft, initial encounter: Secondary | ICD-10-CM

## 2020-05-20 DIAGNOSIS — Z6841 Body Mass Index (BMI) 40.0 and over, adult: Secondary | ICD-10-CM

## 2020-05-20 DIAGNOSIS — T83091A Other mechanical complication of indwelling urethral catheter, initial encounter: Secondary | ICD-10-CM

## 2020-05-20 DIAGNOSIS — E669 Obesity, unspecified: Secondary | ICD-10-CM

## 2020-05-20 DIAGNOSIS — Z7902 Long term (current) use of antithrombotics/antiplatelets: Secondary | ICD-10-CM

## 2020-05-20 DIAGNOSIS — F419 Anxiety disorder, unspecified: Secondary | ICD-10-CM

## 2020-05-20 DIAGNOSIS — F17210 Nicotine dependence, cigarettes, uncomplicated: Secondary | ICD-10-CM

## 2020-05-20 DIAGNOSIS — N401 Enlarged prostate with lower urinary tract symptoms: Secondary | ICD-10-CM

## 2020-05-20 DIAGNOSIS — R310 Gross hematuria: Secondary | ICD-10-CM

## 2020-05-20 DIAGNOSIS — Z79899 Other long term (current) drug therapy: Secondary | ICD-10-CM

## 2020-05-20 DIAGNOSIS — E1122 Type 2 diabetes mellitus with diabetic chronic kidney disease: Secondary | ICD-10-CM

## 2020-05-20 DIAGNOSIS — Z538 Procedure and treatment not carried out for other reasons: Secondary | ICD-10-CM

## 2020-05-20 DIAGNOSIS — R338 Other retention of urine: Secondary | ICD-10-CM

## 2020-05-20 DIAGNOSIS — R319 Hematuria, unspecified: Secondary | ICD-10-CM

## 2020-05-20 DIAGNOSIS — R0602 Shortness of breath: Secondary | ICD-10-CM

## 2020-05-20 DIAGNOSIS — Z9189 Other specified personal risk factors, not elsewhere classified: Secondary | ICD-10-CM

## 2020-05-20 LAB — CBC WITH AUTO DIFFERENTIAL
BKR CO2: 3.9 M/??L (ref 3.8–5.9)
BKR WAM ABSOLUTE IMMATURE GRANULOCYTES: 0.2 x 1000/??L (ref 0.0–0.3)
BKR WAM ABSOLUTE LYMPHOCYTE COUNT: 1.2 x 1000/??L (ref 1.0–4.0)
BKR WAM ABSOLUTE NRBC: 0 x 1000/??L (ref 0.0–0.0)
BKR WAM ANALYZER ANC: 11 x 1000/ÂµL (ref 1.0–11.0)
BKR WAM BASOPHIL ABSOLUTE COUNT: 0 x 1000/??L (ref 0.0–0.0)
BKR WAM BASOPHILS: 0.2 % (ref 0.0–4.0)
BKR WAM EOSINOPHIL ABSOLUTE COUNT: 0 x 1000/??L (ref 0.0–1.0)
BKR WAM EOSINOPHILS: 0 % (ref 0.0–7.0)
BKR WAM HEMATOCRIT: 36.5 % — ABNORMAL LOW (ref 37.0–52.0)
BKR WAM HEMOGLOBIN: 12.2 g/dL (ref 12.0–18.0)
BKR WAM IMMATURE GRANULOCYTES: 1.2 % (ref 0.0–3.0)
BKR WAM LYMPHOCYTES: 9.6 % (ref 8.0–49.0)
BKR WAM MCH (PG): 31.5 pg — ABNORMAL HIGH (ref 27.0–31.0)
BKR WAM MCHC: 33.4 g/dL (ref 31.0–36.0)
BKR WAM MCV: 94.3 fL — ABNORMAL HIGH (ref 78.0–94.0)
BKR WAM MONOCYTE ABSOLUTE COUNT: 0.4 x 1000/??L (ref 0.0–2.0)
BKR WAM MONOCYTES: 2.8 % — ABNORMAL LOW (ref 4.0–15.0)
BKR WAM MPV: 9.9 fL (ref 6.0–11.0)
BKR WAM NEUTROPHILS: 86.2 % — ABNORMAL HIGH (ref 37.0–84.0)
BKR WAM NUCLEATED RED BLOOD CELLS: 0 % (ref 0.0–1.0)
BKR WAM PLATELETS: 215 x1000/??L (ref 140–440)
BKR WAM RDW-CV: 15.7 % — ABNORMAL HIGH (ref 11.5–14.5)
BKR WAM RED BLOOD CELL COUNT: 3.9 M/ÂµL (ref 3.8–5.9)
BKR WAM WHITE BLOOD CELL COUNT: 12.8 x1000/??L — ABNORMAL HIGH (ref 4.0–10.0)

## 2020-05-20 LAB — SARS COV-2 (COVID-19) RNA-~~LOC~~ LABS (BH GH LMW YH): BKR SARS-COV-2 RNA (COVID-19) (YH): NEGATIVE

## 2020-05-20 LAB — BASIC METABOLIC PANEL
BKR ANION GAP: 11 (ref 7–17)
BKR BLOOD UREA NITROGEN: 30 mg/dL — ABNORMAL HIGH (ref 8–23)
BKR BUN / CREAT RATIO: 20.5 (ref 8.0–23.0)
BKR CALCIUM: 8.4 mg/dL — ABNORMAL LOW (ref 8.8–10.2)
BKR CHLORIDE: 102 mmol/L (ref 98–107)
BKR CREATININE: 1.46 mg/dL — ABNORMAL HIGH (ref 0.40–1.30)
BKR EGFR (AFR AMER): 58 mL/min/{1.73_m2} (ref >60)
BKR EGFR (NON AFRICAN AMERICAN): 48 mL/min/1.73m2 (ref >60)
BKR GLUCOSE: 128 mg/dL — ABNORMAL HIGH (ref 70–100)
BKR POTASSIUM: 4.4 mmol/L (ref 3.3–5.1)
BKR SODIUM: 138 mmol/L (ref 136–144)

## 2020-05-20 MED ORDER — ASPIRIN 81 MG TABLET,DELAYED RELEASE
81 mg | Freq: Every day | ORAL | Status: DC
Start: 2020-05-20 — End: 2020-05-21
  Administered 2020-05-20: 13:00:00 81 mg via ORAL

## 2020-05-20 MED ORDER — CLOPIDOGREL 75 MG TABLET
75 mg | Freq: Every day | ORAL | Status: DC
Start: 2020-05-20 — End: 2020-05-21

## 2020-05-20 MED ORDER — CARBOXYMETHYLCELLULOSE SODIUM 0.5 % EYE DROPS IN A DROPPERETTE
0.5 % | Freq: Three times a day (TID) | OPHTHALMIC | Status: DC | PRN
Start: 2020-05-20 — End: 2020-05-21

## 2020-05-20 MED ORDER — PREDNISONE 10 MG TABLET
10 mg | Freq: Every day | ORAL | Status: DC
Start: 2020-05-20 — End: 2020-05-21
  Administered 2020-05-20: 13:00:00 10 mg via ORAL

## 2020-05-20 MED ORDER — BELLADONNA ALKALOIDS-OPIUM 16.2 MG-60 MG RECTAL SUPPOSITORY
Freq: Two times a day (BID) | RECTAL | Status: DC | PRN
Start: 2020-05-20 — End: 2020-05-21

## 2020-05-20 MED ORDER — ROSUVASTATIN 40 MG TABLET
40 mg | Freq: Every evening | ORAL | Status: DC
Start: 2020-05-20 — End: 2020-05-21

## 2020-05-20 MED ORDER — ONDANSETRON HCL (PF) 4 MG/2 ML INJECTION SOLUTION
4 mg/2 mL | Freq: Four times a day (QID) | INTRAVENOUS | Status: DC | PRN
Start: 2020-05-20 — End: 2020-05-21

## 2020-05-20 MED ORDER — DEXTROSE 5 % AND 0.45 % SODIUM CHLORIDE INTRAVENOUS SOLUTION
INTRAVENOUS | Status: DC
Start: 2020-05-20 — End: 2020-05-21
  Administered 2020-05-20: 06:00:00 1000.000 mL/h via INTRAVENOUS

## 2020-05-20 MED ORDER — FUROSEMIDE 40 MG TABLET
40 mg | Freq: Every day | ORAL | Status: DC
Start: 2020-05-20 — End: 2020-05-21
  Administered 2020-05-20: 13:00:00 40 mg via ORAL

## 2020-05-20 MED ORDER — SODIUM CHLORIDE 0.9 % (FLUSH) INJECTION SYRINGE
0.9 % | INTRAVENOUS | Status: DC | PRN
Start: 2020-05-20 — End: 2020-05-21

## 2020-05-20 MED ORDER — GABAPENTIN 300 MG CAPSULE
300 mg | Freq: Every evening | ORAL | Status: DC
Start: 2020-05-20 — End: 2020-05-21

## 2020-05-20 MED ORDER — ESCITALOPRAM 10 MG TABLET
10 mg | Freq: Every evening | ORAL | Status: DC
Start: 2020-05-20 — End: 2020-05-21

## 2020-05-20 MED ORDER — ACETAMINOPHEN 325 MG TABLET
325 mg | Freq: Four times a day (QID) | ORAL | Status: DC | PRN
Start: 2020-05-20 — End: 2020-05-21

## 2020-05-20 MED ORDER — METOPROLOL SUCCINATE ER (TOPROL) 12.5 MG HALFTAB
12.5 mg | Freq: Every day | ORAL | Status: DC
Start: 2020-05-20 — End: 2020-05-21
  Administered 2020-05-20: 13:00:00 12.5 mg via ORAL

## 2020-05-20 MED ORDER — SODIUM CHLORIDE 0.9 % (FLUSH) INJECTION SYRINGE
0.9 % | Freq: Three times a day (TID) | INTRAVENOUS | Status: DC
Start: 2020-05-20 — End: 2020-05-21

## 2020-05-20 MED ORDER — ONDANSETRON 4 MG DISINTEGRATING TABLET
4 mg | Freq: Four times a day (QID) | ORAL | Status: DC | PRN
Start: 2020-05-20 — End: 2020-05-21

## 2020-05-20 NOTE — Telephone Encounter
Attending Surgeon: Dr. Donzetta Kohut                 Other Surgeon/Resident: _____               Contact Person:  Urology [phone]      Surgery Date: 05/20/2020     Inhouse Location: B16  Location:     [facility]             Time preferred: Add-on           Priority LEVEL:  4                ? POST OP ICU BED        Medical Record Number: [id number]           Birthdate: [birth year]                Gender: male    Last Name: Hunter   First Name:  Paul Hua          Middle Initial: J    Patient Allergies: Patient has no known allergies.                           ?Known Latex Allergy    Special Needs:  ?Hard of hearing   ?Deaf - needs sign language interpreter   ?Interpreter Required/Language: ____________                              __ Other                             Anticipated Anesthesia:    General    Side of Body:        N/A        CPT Code:       52001        Surgeon?s Description:       Cystoscopy, clot evacuation, fulguration        Diagnosis:        Nephrolithiasis        ICD-10 Codes        R31.0       Estimated Length of Surgery:     1.5 hours      Special Instructions for OR:    Positioning:        Dorsal lithotomy                                                        Tables:     Cysto        Implants: ___none__                                                          Vendor:     none        Special Instruments:        none  Special Equipment:    none

## 2020-05-20 NOTE — ED Notes
10:20 PM 69 y/o M BIBA for urology problem.  Patient was at urologist around 2 pm for a cystoscopy.  Patient had foley placed.  Around 5 pm patient began to experience unbearable 10 out of 10 pain with no urinary output.  Patient then called EMS and was transp;orted to the hospital.  No urinary output noted.10:23 PMIV inserted, morphine administered.10:38 PM1 mg dilaudid administered.10:55 PMUrology at bedside.12:01 AM250 ml of sterile water injected into patient's bladder at urology's request.  Patient to [place].12:22 AMPatient back from [place].1:32 AMPatient swabbed for Covid.

## 2020-05-20 NOTE — Other
-    CONSULT  REQUEST  DOCUMENTATION  -  [facility] NOTE  -  Type of consult: [facility] General    -  New Consult: [id number] Onalee Hua J [place] /Location: 15250/15250-A / Brief Clinical Question: 57M w/hx NSCLC (on chemo), CAD (2 MIs, 3 stents, on Aspirin/Plavix) now w/ recurrent hematuria, presented to ED with retention now pending possible cysto/clot evacuation. Would like recs on holding Plavix given hematuria/need for possible OR/Callback Cell Phone: [phone] / Please confirm receipt of this message by texting back ?OK?  -  1 - Mobile Heartbeat message sent to [place], G at 8:27 AM. Received response at 08:41.  Trudee Grip IV, PCT  05/20/2020  8:26 AM  Consult [facility] [phone]

## 2020-05-20 NOTE — Progress Notes
Urologic Surgery Progress Note    Attending Provider: Garnetta Buddy, MD    Admit Date: 05/19/2020  Hospital Day: 2  Code status: Full Code/ACLS  Allergy: Patient has no known allergies.    Background:    Paul Hunter is a 69 y.o. male w/ PMH NSCLC on chemotherapy, CAD (s/p PCI on ASA/Plavix), sleep apnea on CPAP, nephrolithiasis, and recurrent hematuria s/p office cystoscopy 9/23 by Dr. Meryl Dare c/b urinary retention, penile pain, and poor urinary catheter drainage, pending possible cysto/clot evac today with Dr. Donzetta Kohut.    Subjective/Interim Events:     - VSS RA  - Pain well controlled  - Denies nausea, vomiting, fevers, chills  - 22Fr Hematuria catheter in place draining clear red urine, irrigated at bedside, remained clear without clots  - AM labs unremarkable    Objective:   Temp:  [97.3 ?F (36.3 ?C)-98.3 ?F (36.8 ?C)] 97.8 ?F (36.6 ?C)  Pulse:  [62-106] 65  Resp:  [17-20] 20  BP: (99-183)/(63-91) 163/91  SpO2:  [92 %-98 %] 92 %  Device (Oxygen Therapy): room air    I/O last 3 completed shifts:  In: 604.2 [I.V.:604.2]  Out: 2575 [Urine:2575]     CBC  Lab Results   Component Value Date    WBC 12.8 (H) 05/20/2020    HGB 12.2 05/20/2020    HCT 36.5 (L) 05/20/2020    PLT 215 05/20/2020     ELECTROLYTES  Lab Results   Component Value Date    NA 138 05/20/2020    K 4.4 05/20/2020    CL 102 05/20/2020    CO2 25 05/20/2020    CREATININE 1.46 (H) 05/20/2020    BUN 30 (H) 05/20/2020    GLU 128 (H) 05/20/2020    CALCIUM 8.4 (L) 05/20/2020    MG 2.3 05/12/2020     PT/INR/PTT  No results found for: INR, PTT  Microbiology:  Lab Results   Component Value Date    LABURIN No Growth 05/14/2020    LABURIN SEE NOTE 05/03/2020    LABURIN No Growth 04/25/2020         Physical Exam  Constitutional: well appearing, resting in bed in NAD  Cardiovascular: regular rate and rhythm  Pulmonary: comfortable on room air  Abdominal: soft, non-tender, non-distended  Genitourinary: Foley draining clear red urine without clots  Ext/MSK: warm, well perfused  Neurological: alert, oriented x3  Dr Cyndie Chime was present as a chaperone for the GU portion of the physical exam    Assessment   Paul Hunter is a 69 y.o. male, Hospital Day: 2, w/ PMH NSCLC on chemotherapy, CAD (s/p PCI on ASA/Plavix), sleep apnea on CPAP, nephrolithiasis, and recurrent hematuria s/p office cystoscopy 9/23 by Dr. Meryl Dare c/b urinary retention, penile pain, and poor urinary catheter drainage, pending possible cysto/clot evac today with Dr. Donzetta Kohut.    Plan     - Diet: NPO  - IVF: D51/2NS @ 125  - Pain: Tylenol, B+O suppositories  - Cards consulted for recs re holding Plavix  - Restarted home meds   - DVT PPX:  SCDs  - OOB, Ambulate    Dispo: pending possible OR for cysto/clot evac vs home if urine stays clear    Signed:  Roberto Scales, MD  Floor Pager (for primary patients): [phone]  Consult Pager: [phone] from 6am-6pm.   Amion for nights/weekends.    Attending Addendum:

## 2020-05-20 NOTE — Plan of Care
Plan of Care Overview/ Patient Status    Pt a/ox4. Arrived from ED with foley in placed d/t hematuria. Foley patent with red urine. Remains NPO. IVF as ordered. OOB with standby and cane. Denies pain. Awaiting plan from urology. Will monitor.

## 2020-05-20 NOTE — Plan of Care
Problem: Adult Inpatient Plan of CareGoal: Readiness for Transition of CareOutcome: Interventions implemented as appropriate CM Assessment and Discharge Planning    Most Recent Value Case Management Assessment Do you have a caregiver? No Patient Requires Care Coordination Intervention Due To without primary physician, discharge planning needs/concerns, change in physical function, unscheduled re-admission within 7 days, readmission within the last 30 days Arrived from prior to admission home Services Prior to Admission none Name of chemotherapy/radiation therapy: was on Chemo ADL Assistance None Type of Home Care Services None Equipment Currently Used at Home CPAP/BiPAP, cane, straight, nebulizer Documented Insurance Accurate Yes Any financial concerns related to anticipated discharge needs No Patient's home address verified Yes Patient's PCP of record verified No Patient has new PCP (specify name). PLEASE UPDATE DEMOGRAPHICS NO PCP / stated his brother is setting him up with his physician, but he can't remember the name Living Environment  Lives With Alone Current Living Arrangements home/apartment/condo Home Accessibility bed and bath on same level, no concerns Transportation Available family or friend will provide, car Living Arrangement Comments Mother In Law apartment at [place] Home Safety Feels Safe Living In Home yes Potentially Unsafe Housing Conditions none Source of Clinical History Patient's clinical history has been reviewed and source of Information is: Patient Completed Assessment Completed Initial Assesment by: Name Ardyth Gal Role: Transition Coordinator CM/SW Attestation: Choose which ONE is appropriate for you I have reviewed the medical record and agree with the above evaluation by the transition coordinator/discharge planning coordinator with the following recommendations. Yes Discharge Planning Coordination Recommendations Discharge Planning Coordination Recommendations Needs not determined at this time RN reviewed plan of care/ continuum of care need's with  Transitional care rounds Discharge Planning Concerns to be Addressed care coordination/care conferences, discharge planning   Plan of Care Overview/ Patient Status    Per H&P: Paul Hunter is a 69 y.o. male, Hospital Day: 2, w/ PMH NSCLC on chemotherapy, CAD (s/p PCI on ASA/Plavix), sleep apnea on CPAP, nephrolithiasis, and recurrent hematuria s/p office cystoscopy 9/23 by Dr. Meryl Dare c/b urinary retention, penile pain, and poor urinary catheter drainage, pending possible cysto/clot evac today with Dr. Donzetta Kohut

## 2020-05-20 NOTE — Plan of Care
Problem: Adult Inpatient Plan of Care  Goal: Readiness for Transition of Care  05/20/2020 1140 by Paul Meiers, RN  Outcome: Interventions implemented as appropriate     CM Assessment and Discharge Planning        Most Recent Value   Case Management Assessment   Do you have a caregiver? No   Patient Requires Care Coordination Intervention Due To without primary physician, discharge planning needs/concerns, change in physical function, unscheduled re-admission within 7 days, readmission within the last 30 days   Arrived from prior to admission home   Services Prior to Admission none   Paul of chemotherapy/radiation therapy: was on Chemo   ADL Assistance None   Type of Home Care Services None   Equipment Currently Used at Home CPAP/BiPAP, cane, straight, nebulizer   Documented Insurance Accurate Yes   Any financial concerns related to anticipated discharge needs No   Patient's home address verified Yes   Patient's PCP of record verified No   Patient has new PCP (specify Paul). PLEASE UPDATE DEMOGRAPHICS NO PCP / stated his brother is setting him up with his physician, but he can't remember the Paul   Living Environment    Lives With Alone   Current Living Arrangements home/apartment/condo   Home Accessibility bed and bath on same level, no concerns   Transportation Available family or friend will provide, car   Living Arrangement Comments Mother In Law apartment at [place]   Home Safety   Feels Safe Living In Home yes   Potentially Unsafe Housing Conditions none   Source of Clinical History   Patient's clinical history has been reviewed and source of Information is: Patient   Completed Assessment   Completed Initial Assesment by: Paul Hunter   Role: Transition Coordinator   CM/SW Attestation: Choose which ONE is appropriate for you   I have reviewed the medical record and agree with the above evaluation by the transition coordinator/discharge planning coordinator with the following recommendations. Yes   Discharge Planning Coordination Recommendations   Discharge Planning Coordination Recommendations Needs not determined at this time   RN reviewed plan of care/ continuum of care need's with  Transitional care rounds   Discharge Planning   Assessment of the spouse/caregiver's ability and willingness to provide care for the patient endorses good support from sister in law and niece   Concerns to be Addressed care coordination/care conferences, discharge planning   Referral(s) placed for: None   Patient goals/treatment preference for discharge are:  return home   Home Health Care Services Required N/A   Patient is considered homebound due to: (he/she requires considerable and taxing effort to leave their residence for medical reasons or religious services OR infrequently OR of short duration for other reasons) n/a   Equipment Needed After Discharge cane, straight, other (see comments)  [CPAP]   Mode of Transportation  Private car  (add comment for special considerations)   Patient to be accompanied by family member   Is the patient discharging to their home address Yes   CM D/C Readiness   PASRR completed and approved N/A   Authorization number obtained, if required N/A   Is there a 3 day INPATIENT Qualifying stay for Medicare Patients? N/A   Medicare IM- signed, dated, timed and scanned, if required N/A   DME Authorized/Delivered N/A   No needs identified/ follow up with PCP/MD Yes   Post acute care services secured W10 complete N/A   Pri Completed and Accepted  N/A  Finalized Plan   Expected Discharge Date 05/20/20   Discharge Disposition Home or Self Care   Post acute care services secured W10 complete N/A   Physician documentation required AVS (Patient Instructions), Prescriptions   Discharge Coordination/Progress Discharge to home. No home care sevices. Transportation home by family member.           Plan of Care Overview/ Patient Status      Per H&P: Paul Hunter is a 69 y.o. male, Hospital Day: 2, w/ PMH NSCLC on chemotherapy, CAD (s/p PCI on ASA/Plavix), sleep apnea on CPAP, nephrolithiasis, and recurrent hematuria s/p office cystoscopy 9/23 by Dr. Meryl Dare c/b urinary retention, penile pain, and poor urinary catheter drainage, pending possible cysto/clot evac today with Dr. Donzetta Kohut.  Medical record reviewed. Met with patient at bedside and explained role of Care Management. [name] is alert and oriented. He resides in an in law apartment connected to his niece's home in [place]. Prior to admission, patient used a cane at baseline. He is independent with ADLs and self foley care. No home care services were in place. Uses nocturnal CPAP.  Medically cleared for discharge. Discussed discharge needs with patient. He endorses a good support system for assistance post discharge. Declined need for home care services. Patient is agreeable with POC. Transportation home by family member.  Paul Hunter R.N. BS. Case Management  [facility]  [address]  [place], [place] [address]  MHB: [phone]

## 2020-05-20 NOTE — Utilization Review (ED)
UM Status: Managed Medicare IP, admitted to Urology for management of foley, pain, hematuria

## 2020-05-20 NOTE — Other
[facility]    [facility]     Urology Consult History and Physical    Reason for Consult: gross hematuria  Requested by: Dr. Reuben Likes  Urology Attending: Dr. Donzetta Kohut    Patient exam or treatment required medical chaperone.  The sensitive parts of the examination were performed with chaperone present: Yes; Chaperone Name, Role/Title: Holly Bodily, MD    HPI:   Paul Hunter is a very pleasant 69 year old male with a history of NSCLC (currently on Carbo/Taxol/Pembro every 3 weeks, on cycle 4), CAD (2 Mis, 3 stents, on Aspirin/Plavix), sleep apnea on CPAP, nephrolithiasis and recurrent hematuria (with multiple prior negative work-ups, most recently in 2019) who presents to the ED with penile pain and poor urinary catheter drainage following cystoscopy as part of hematuria work-up.    He previously presented to the [facility] on 9/18 with symptomatic urinary retention for which a foley catheter was placed. Represented with issues with his foley on 9/20 and was discharged. Presented this morning to the ED with issues with hematuria and lower abdominal pain. He was discharged and was seen in the office for his scheduled cystoscopy with Dr. Meryl Dare. Cystoscopy was notable for obstructive prostate, erythematous changes on the posterior bladder wall consistent with an indwelling catheter, and a blood clot on the left bladder wall obscuring vision on the left side. Ultimately his 16 Fr foley was replaced and he was sent home.    He presents this evening with penile pain and no urine output since 5 pm. Under sterile conditions his existing 16 Fr foley was exchanged for a 22 Fr hematuria catheter. Immediate return of 1 L of clear yellow urine. Manual irrigation performed to attempt to extract known blood clot, but was unable to do so.    A formal ultrasound was obtained which demonstrated echogenic debris in his bladder.    ROS: A 10 system ROS is negative except per HPI    PMH:  has a past medical history of Anxiety, CAD (coronary artery disease), Diabetes mellitus (HC Code), GERD (gastroesophageal reflux disease), Hyperlipidemia, Hypertension, Hypertrophy of prostate without urinary obstruction and other lower urinary tract symptoms (LUTS), Other testicular hypofunction, Spinal stenosis (09/28/2018), Tobacco abuse, and Vitamin D deficiency.  PSH:  has a past surgical history that includes Carotid stent (08/28/2003); Cholecystectomy (11/07/2016); Knee surgery; Laminectomy (10/07/2008); and Lumbar disc surgery.  Allergies: has No Known Allergies.  FH: family history is not on file.  SH:  reports that he has been smoking cigarettes. He does not have any smokeless tobacco history on file. No history on file for alcohol use and drug use.    Medications: albuterol sulfate, aspirin, atorvastatin, carboxymethylcellulose, clopidogreL, escitalopram oxalate, fexofenadine, furosemide, gabapentin, lidocaine-prilocaine, metoprolol succinate XL, multivitamin, oxyCODONE, oxybutynin XL, polyethylene glycol, and predniSONE    Vitals:  Temp:  [97.4 ?F (36.3 ?C)-98.3 ?F (36.8 ?C)] 98.3 ?F (36.8 ?C)  Pulse:  [68-106] 106  Resp:  [18-20] 20  BP: (99-183)/(63-84) 183/84  SpO2:  [96 %] 96 %  Device (Oxygen Therapy): room air    Exam:  Gen: Alert, oriented, NAD  CV: RR  Pulm: Non-labored breathing  Abd: soft, NT/ND  Ext: WWP  GU: uncircumcised phallus, testes descended bilaterally, Foley in place draining yellow/light pink urine    Labs:  WBC/Hgb/Hct/Plts:  14.1/11.8/34.9/184 (09/23 1216)  Na/K/Cl/CO2:  139/3.8/104/23 (09/23 1216)  BUN/Cr/GLU/ALT/AST/AMYLASE/LIPASE:  34/1.50/155/--/--/--/-- (09/23 1216)     UA:  Lab Results   Component Value Date  SPECGRAV 1.019 05/19/2020    PHUR 6.0 05/19/2020    KETONESU Negative 05/19/2020    PROTEINUA 1+ (A) 05/19/2020    BLOODU 3+ (A) 05/19/2020    LEUKOCYTESUR Positive (A) 05/19/2020    NITRITE Negative 05/19/2020     Microbiology:   Reviewed    Radiology:   [place] Retroperitoneal Complete    Result Date: 05/20/2020  1.  Mild left-sided hydronephrosis and left renal atrophy, stable. 2.  Echogenic material/debris within the bladder may represent clot versus debris from an infectious etiology. Correlation with urinalysis is recommended. Report Initiated By:  Orlie Pollen, MD Reported And Signed By: Lavera Guise, MD  [facility] Radiology and Biomedical Imaging    ASSESSMENT/RECOMMENDATIONS:  Paul Hunter is a very pleasant 69 year old male with a history of NSCLC (currently on Carbo/Taxol/Pembro every 3 weeks, on cycle 4), CAD (2 Mis, 3 stents, on Aspirin/Plavix), sleep apnea on CPAP, nephrolithiasis and recurrent hematuria (with multiple prior negative work-ups, most recently in 2019) who presents to the ED with penile pain and poor urinary catheter drainage following cystoscopy as part of hematuria work-up. Noted to have poorly draining foley which was subsequently upsized with good effect. Given repeated ED presentations due to issues with foley catheter, will admit for observation and tentatively plan for OR for cystoscopy and clot evac pending urine color.    - Admit to urology under Dr. Donzetta Kohut  - NPO/IVF  - Will tentatively add-on for cystoscopy and clot evacuation/fulguration  - Will discuss safety of holding Plavix with Cardiology in the AM  - Maintain foley to gravity    Discussed with chief resident Dr. Dairl Ponder and attending Dr. Donzetta Kohut  ________________  [name], MD  05/20/2020  1:33 AM   [facility] Floor Pager (for primary patients): [phone]  [facility] Consult Pager: [phone] from 6am-6pm  [facility] Urology Pager: [facility] Dynamic Role from 6am-6pm  Amion for nights/weekends

## 2020-05-20 NOTE — Discharge Instructions
Foley CareYour Foley needs to remain in until your follow up appointment.You need to clean your catheter, change your drainage bags, and wash your drainage bags every day.You may see some blood or urine around where the catheter enters your body, especially when walking or having a bowel movement (pooping). This is normal, as long as there?s urine draining into the drainage bag. If there?s not, call your healthcare provider.Medication:You should resume your medications as detailed below. You should take an over the counter stool softener and may need a laxative while taking narcotic medications. -- Please hold your Plavix until your 06/03/2020 appointmentDo not take your narcotic medication with acetaminophen (Tylenol) or other acetaminophen containing products. You may take plain acetaminophen and reserve the narcotic medication for moderate to severe pain or for night time use. The daily maximum limit of acetaminophen for adults is 4000mg . Call your doctor WNU:[id number] to keep food down Increase abdominal pain, distention, nausea or vomiting A fever >101.5*F and notice any redness, swelling, or cloudy / gray discharge from your incision(s).Difficulty or inability to urinateShortness of breath or chest pain that does not resolve with restQuestions or concerns!Follow Up: You will follow up in 2 weeks for cystoscopy. Keep foley in until then. A scheduler will call you to schedule your appointment. If you do not hear from them, please call the urology clinic.For any questions or concerns, please call the Urology Clinic at [phone] Appointments     Provider Department Center  05/23/2020 9:00 AM Nikki Dom, MD [facility] Program at [facility] at [address] [facility] M  05/27/2020 8:00 AM Huston, [place], MD [facility] CAD  06/03/2020 11:00 AM Mulinski, Theotis Burrow., APRN [facility] Onco-Oncology Program at [facility] M  06/08/2020 8:00 AM Weston Brass, DO [facility] at [address] [facility] Rad  [id number] 7:45 AM Devito, Daralene Milch., MD [facility] Urology at [address] [facility] YM CAD  06/21/2020 12:00 PM [facility] MR MRC [facility] Rad

## 2020-05-20 NOTE — Discharge Summary
[facility]    Urology Discharge Summary    Patient Data:    Patient Name: Paul Hunter  Admit date: 05/19/2020    Age: 69 y.o.  Discharge date: 05/20/2020   DOB: [birth year]           Discharge Attending Physician: Garnetta Buddy, MD    MRN: [id number]             Discharged Condition: good   PCP: Pcp, No  Disposition: Home      Admitting Diagnosis: Urinary catheter complication, initial encounter (HC Code) [T83.9XXA]     Hospital Course:      Paul Hunter is a 69 y.o. male, with a past medical history significant for NSCLC on chemotherapy, CAD (s/p PCI on ASA/Plavix), sleep apnea on CPAP, nephrolithiasis, and recurrent hematuria s/p office cystoscopy 9/23 by Dr. Meryl Dare, who was admitted to the Urology Service on 05/19/2020, due to urinary retention, penile pain, and poor urinary catheter drainage.     He previously presented to the [facility] on 9/18 with symptomatic urinary retention for which a foley catheter was placed. Represented with issues with his foley on 9/20 and was discharged, and then seen in the office for his scheduled cystoscopy with Dr. Meryl Dare. Cystoscopy was notable for obstructive prostate, erythematous changes on the posterior bladder wall consistent with an indwelling catheter, and a blood clot on the left bladder wall obscuring vision on the left side. Ultimately his 16 Fr foley was replaced and he was sent home. He then presented the evening of 9/23 with penile pain and urinary retention. His 16Fr foley was exchanged for a 22Fr hematuria catheter, with immediate return of 1L clear urine. Manual irrigation was performed without extraction of blood clots. Formal ultrasound demonstrated echogenic debris in bladder. After weighing the risks and benefits of his his antiplatelet therapy, his Plavix was held given his gross hematuria.    Patient was admitted on 05/20/2020 in stable condition. His hemoglobin was 12.2, and his Cr 1.46 (baseline). His foley was draining clear red urine without clots. He was irrigated at bedside, no clots were removed. His urine remained clear. Cardiology was consulted for recommendations regarding dual antiplatelet therapy. After discussion with Dr. Donzetta Kohut and patient, it was decided that patient would not require operative intervention at this time given clarity of his urine and comfort of patient with foley catheter. Cardiology recommended holding Plavix until follow-up with cardiology on 06/03/2020.     On 05/20/20, the patient was meeting milestones for discharge to home. The patient will follow up with the next available urologist for cystoscopy in 2 weeks. He will continue holding Plavix, and will go home with the catheter in place. The rest of the discharge information has been entered electronically.      Pertinent Procedure/Surgeries:  05/20/2020 - Procedure(s) (LRB):  CYSTOSCOPY,CLOT EVACUATION , FULGURATION (N/A)     Objective:      PMH PSH   Past Medical History:   Diagnosis Date   ? Anxiety    ? CAD (coronary artery disease)    ? Diabetes mellitus (HC Code)    ? GERD (gastroesophageal reflux disease)    ? Hyperlipidemia    ? Hypertension    ? Hypertrophy of prostate without urinary obstruction and other lower urinary tract symptoms (LUTS)    ? Other testicular hypofunction    ? Spinal stenosis 09/28/2018   ? Tobacco abuse    ? Vitamin D deficiency       Past  Surgical History:   Procedure Laterality Date   ? CAROTID STENT  08/28/2003   ? CHOLECYSTECTOMY  11/07/2016   ? KNEE SURGERY      torn meniscus   ? LAMINECTOMY  10/07/2008   ? LUMBAR DISC SURGERY      herniated disc        Social History Family History   Social History     Tobacco Use   ? Smoking status: Current Every Day Smoker     Types: Cigarettes   Substance Use Topics   ? Alcohol use: Not on file    No family history on file.       Discharge Vitals:  Blood pressure (!) 160/78, pulse 65, temperature 97.9 ?F (36.6 ?C), temperature source Oral, resp. rate 18, height 5' 10 (1.778 m), weight (!) 140.9 kg, SpO2 94 %.     Physical Exam:  Constitutional: well appearing, resting in bed in NAD  Cardiovascular: regular rate and rhythm  Pulmonary: comfortable on room air  Abdominal: soft, obese, non-tender, non-distended  Genitourinary: 22Fr hematuria catheter draining pink tinged clear yellow urine  Ext/MSK: warm, well perfused  Neurological: alert, oriented x3    Pertinent Imaging/Lab Tests:   Retroperitoneal [place] 9/24  1.  Mild left-sided hydronephrosis and left renal atrophy, stable.  2.  Echogenic material/debris within the bladder may represent clot versus debris from an infectious etiology. Correlation with urinalysis is recommended.    Pending Labs and Tests: N/A      Discharge Medications:   Current Discharge Medication List      CONTINUE these medications which have NOT CHANGED    Details   aspirin 81 MG EC tablet Take 81 mg by mouth daily.      atorvastatin (LIPITOR) 80 mg tablet Take 1 tablet (80 mg total) by mouth nightly.  Qty: 30 tablet, Refills: 6      carboxymethylcellulose (REFRESH PLUS) 0.5 % ophthalmic solution in dropperette Place 1 drop into both eyes 3 (three) times daily as needed.      escitalopram oxalate (LEXAPRO) 10 mg tablet Take 1 tablet (10 mg total) by mouth nightly.  Qty: 90 tablet, Refills: 3      fexofenadine (ALLEGRA) 180 mg tablet Take 180 mg by mouth daily as needed.       furosemide (LASIX) 40 mg tablet Take 1 tablet (40 mg total) by mouth daily.  Qty: 30 tablet, Refills: 2      gabapentin (NEURONTIN) 300 mg capsule Take 2 capsules (600 mg total) by mouth at bedtime.  Qty: 60 capsule, Refills: 0    Associated Diagnoses: Spinal stenosis, unspecified spinal region      metoprolol succinate XL (TOPROL-XL) 25 mg 24 hr tablet Take 0.5 tablets (12.5 mg total) by mouth daily. Take with or immediately following a meal.  Qty: 30 tablet, Refills: 0      multivitamin capsule Take 1 capsule by mouth daily.       oxybutynin XL (DITROPAN-XL) 5 mg 24 hr tablet Take 1 tablet (5 mg total) by mouth daily.  Qty: 90 tablet, Refills: 0      oxyCODONE (ROXICODONE) 5 mg Immediate Release tablet Take 1 tablet (5 mg total) by mouth every 4 (four) hours as needed for up to 5 doses.  Qty: 5 tablet, Refills: 0      polyethylene glycol (GLYCOLAX; MIRALAX) 17 gram/dose powder Take by mouth.      !! predniSONE (DELTASONE) 10 mg tablet Take 13 tablets (130 mg total) by mouth  daily for 7 days, THEN 12 tablets (120 mg total) daily for 7 days, THEN 11 tablets (110 mg total) daily for 7 days, THEN 10 tablets (100 mg total) daily for 7 days, THEN 9 tablets (90 mg total) daily for 7 days, THEN 8 tablets (80 mg total) daily for 7 days, THEN 7 tablets (70 mg total) daily for 7 days, THEN 6 tablets (60 mg total) daily for 7 days, THEN 5 tablets (50 mg total) daily for 7 days, THEN 4 tablets (40 mg total) daily for 7 days. Take with food.Rob Bunting: 595 tablet, Refills: 0      !! predniSONE (DELTASONE) 10 mg tablet Take 3 tablets (30 mg total) by mouth daily for 7 days, THEN 2 tablets (20 mg total) daily for 7 days, THEN 1 tablet (10 mg total) daily for 7 days, THEN 0.5 tablets (5 mg total) daily for 7 days, THEN 0.5 tablets (5 mg total) daily for 7 days. Take with food.Rob Bunting: 49 tablet, Refills: 0  Start date: 06/28/2020, End date: 08/02/2020       !! - Potential duplicate medications found. Please discuss with provider.      STOP taking these medications       albuterol sulfate 90 mcg/actuation HFA aerosol inhaler        clopidogreL (PLAVIX) 75 mg tablet        lidocaine-prilocaine (EMLA) 2.5-2.5 % cream              ISSUES TO FOLLOW UP POST-DISCHARGE    1.) Follow up with next available urologist within 2 weeks for cystoscopy  2.) Hold Plavix until your next follow up with cardiology on 06/03/2020      Follow-up Information:  Urology, [place] [facility]      A scheduler will call to make your appointment in the next 2 weeks for cystoscopy with the next available urologist.      Electronically Signed:  Roberto Scales, MD 05/20/2020     Addendum :     [facility]

## 2020-05-20 NOTE — Progress Notes
Paul Hunter is here today for a cystoscopy.  Patient informed of procedure and questions answered.  Instructions for pre procedure given to patient.  Written consent and time out obtained.  Pt's balloon deflated with 7ml of urine and a #74fr foley catheter removed in its entirety.  Patient prepped according to protocol.  Tolerated procedure well.  Pt's bp slightly lower before procedure and pt states the ER gave him morphine, after procedure bp within normal limits again, pt asymptomatic the entire visit.  Post procedure instructions reviewed with patient, pt encouraged to increase water intake and he verbalizes understanding.  Fosfomycin given per Dr.Devito.  Pt was prepped in sterile fashion after cystoscopy and a #71fr coude foley catheter was placed without difficulty with light red tinged urine, balloon inflated with 10ml of sterile water.  Pt's catheter irrigated with of sterile water, light pink-red tinged urine with no clots, pt connected to new overnight bag and a stat lock placed on L-leg.  Pt given extra supplies and pt later called for f/u appt in October with Dr.Devito.

## 2020-05-20 NOTE — Telephone Encounter
Patient currently has follow up with Dr. Meryl Dare on 06/15/2020. However patient would prefer to see another provider and needs to be seen sooner. Please call the patient to arrange follow up with the first available urologist for cystoscopy in 2 weeks. Patient can go to Resident clinic, ok to overbook. If no providers are available sooner, OK to arrange earlier appointment with Dr. Meryl Dare. Patient needs:MD Visit: 2 weeks In Person Visit / Must be Face to Face. Future Appointments     Provider Department Center  05/23/2020 9:00 AM Nikki Dom, MD [facility] Oncology Program at [facility] at [address] [facility] [place] M  05/27/2020 8:00 AM Quillian Quince, [place], MD [facility] Y[facility] CAD  06/03/2020 11:00 AM Mulinski, Theotis Burrow., APRN [facility] Onco-Oncology Program at [facility] [place] M  06/08/2020 8:00 AM Weston Brass, DO [facility] at [address] [facility] Rad  16/05/9603 7:45 AM Devito, Daralene Milch., MD [facility] Urology at [address] [facility] Y[facility] CAD  06/21/2020 12:00 PM [facility] MR MRC [address] [facility] MRI [facility] Rad

## 2020-05-20 NOTE — Plan of Care
Plan of Care Overview/ Patient Status    0700-1650Pt A&Ox4, VSS ex for asymptomatic HTN this morning. Provider aware, BP meds given per [facility]. Plavix continued to be held. No shortness of breath on RA. No complain of pain. NPO this morning for possible procedure, IVF running per [facility]. Pt placed back to regular diet. Tolerated meal very well. 1200 Pt lost IV access- IVF stopped, covering provider notified. Pt with foley catheter in place, foley care instruction provided. Draining clear red urine with small clots. Covering provider aware of clot. 1 BM this shift. SBA OOB, pt uses cane at baseline. 1530 Discharge instructions provided, pt verbalized understanding. Foley care instructions provided. Extra foley bag provided to patient. All IV's removed. All belongings with patient. Safety maintained, rounding performed, see flowsheet for more details.Paul Hunter was discharged via [employer] accompanied by [employer].  Verbalized understanding of discharge instructionsand recommended follow up care as per the after visit summary.  Written discharge instructions provided. Denies any further questions. Vital signs    Vitals:  05/20/20 0700 05/20/20 0752 05/20/20 1155 05/20/20 1156 BP:  (!) 163/91 (!) 161/76 (!) 160/78 Pulse:  65 65  Resp:  20 18  Temp:  97.8 ?F (36.6 ?C) 97.9 ?F (36.6 ?C)  TempSrc:  Oral Oral  SpO2:  (!) 92% 94%  Weight:     Height: 5' 10 (1.778 m)

## 2020-05-20 NOTE — Anesthesia Pre-Procedure Evaluation
This is a 69 y.o. male scheduled for CYSTOSCOPY,CLOT EVACUATION , FULGURATION (N/A ).Review of Systems/ Medical HistoryPatient summary, nursing notes, EKG/Cardiac Studies  and Labs reviewed.Anesthesia Evaluation: Estimated body mass index is 43.05 kg/m? as calculated from the following:  Height as of this encounter: 5' 10 (1.778 m).  Weight as of this encounter: 136.1 kg. CC/HPI: skeleton68 y.o. M with  current tobacco use, HTN, OSA on CPAP, history of CAD s/p MIx2 and PCI x4 , HFrEF? LVEF 39% (OSH spect 09/2019), inferior wall hypokinesis with rega stress test 10/09/19 showing inferior infarct with recovery? with recent TTE 01/2020 showing LVEF of 62%, stage IV NSCLC, recurrent hematuria, now planned cystoscopy and clot evacuationPast Surgical History:  Past Surgical History:08/28/2003: CAROTID STENT03/14/2018: CHOLECYSTECTOMYNo date: KNEE SURGERY    Comment:  torn meniscus02/06/2009: LAMINECTOMYNo date: LUMBAR DISC SURGERY    Comment:  herniated discCardiovascular:Patient has a history of: hypertension.  Dyspnea. -Coronary Artery Disease: CAD -Vascular Disease:  Negative   -Other Cardiovascular:  05/06/20:Highly abnormal PET myocardial perfusion study following pharmacologic vasodilation with regadenoson and at rest with high risk features.  Perfusion imaging was abnormal showing a large sized, moderate to severe intensity, reversible perfusion defect in the basal to apical inferior and inferolateral walls consistent with ischemia; and a medium to large sized, moderate intensity, reversible perfusion defect in the mid to apical anterior and anterolateral walls consistent with ischemia.  There is visual transient ischemic dilation  There was transient post-stress LV dysfunction, resting ejection fraction 40% and stress ejection fraction 36% with abnormal regional wall motion showing global hypokinesis and inferior akinesis.  The left ventricle was severely enlarged in size.  Stress electrocardiogram was abnormal due to ischemic ECG changes.  [place] performed for attenuation correction showed coronary artery calcification in the left main artery (moderate), left anterior descending artery (severe), diagonal artery (severe), left circumflex artery (mild), and right coronary artery (mild).    Non-cardiac findings of the [place] are described in a separate report from Radiology.  No prior study available for comparison.* Global myocardial blood flow was reduced at 0.78 ml/g/min during stress and normal at 0.68 ml/g/min during rest.  Global myocardial flow reserve was reduced at 1.15.* These findings highly suspicious for multi-vessel CADTTE 05/11/20:  * Normal left ventricular size, systolic function and wall motion. Moderate concentric left ventricular hypertrophy.  LVEF calculated by biplane Simpson's was 68%.  Abnormal tissue Doppler suggestive of abnormal diastolic function.  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  Previous study ascending aorta size was 4.4cm* Inferior vena cava was not well visualized.* Compared with the prior study, dated 02/08/2020, there are changes noted.  Wall motion in inferior and inferoseptal walls noted on current study, though compared to prior image, no significant change in wall motion abnormality.Marland Kitchen Respiratory:  Patient has shortness of breath.-Obstructive Sleep apnea:   yes, CPAP use.  -Comments: Stage IV lung Ca on chemoHEENT: Negative.Spine/Spinal Cord Disorders:  History of spinal stenosis.Gastrointestinal/Genitourinary: -Gastrointestinal Disorders:  Patient has GERD.Endocrine/Metabolic: -Diabetes mellitus:  Patient has diabetes mellitus.-Adrenal Disorders: Patient has a history of history of steroid use. Recent Labs Lab 09/18/211738 09/20/211127 09/23/211216 WBC 11.4* 15.0* 14.1* HGB 11.9* 12.0 11.8* HCT 35.4* 36.0* 34.9* PLT 169 177 184  Recent Labs Lab 09/18/211738 09/20/211127 09/23/211216 NEUTROPHILS 87.9* 88.0* 72.9  Recent Labs Lab 09/18/211738 09/20/211127 09/23/211216 NA  141 140 139 K 4.3 4.0 3.8 CL 105 107 104 CO2 25 24 23  BUN 42* 35* 34* CREATININE 1.57* 1.46* 1.50* GLU 117* 232* 155* ANIONGAP 11 9 12   Recent Labs Lab 09/18/211738 09/20/211127 09/23/211216 CALCIUM 8.5* 8.3* 8.3*  Recent Labs Lab 09/18/211738 ALT 59 AST 23 ALKPHOS 57 BILITOT 0.3  No results for input(s): PTT, LABPROT, INR in the last 168 hours. Recent Labs Lab 09/18/211738 09/20/211127 09/23/211216 GLU 117* 232* 155*  Anesthesia PlanASA 4 The primary anesthesia plan is  general ETT. Perioperative Code Status confirmed: It is my understanding that the patient is currently designated as 'Full Code' and will remain so throughout the perioperative period.

## 2020-05-20 NOTE — Progress Notes
Time Out Documentation?	Procedure Name: Cystoscopy?	Procedure Time: 2:39 PM ?	Team Members Present: Cyril Loosen, RN, Bernadene Person, MD?	Time Out initiated after patient positioned & prior to beginning of procedure: Yes?	Name of Patient  & MRUN # or DOB stated and matches ID band or previously confirmed med record number: Yes?	Proceduralist states or confirms procedure to be performed: Yes?	Procedural consent is used to verify procedure performed  & matches pt identifiers:Yes?	Site of procedure(s) (with laterality or level) is topically marked per policy & visible after draping:N/A ?	Final check completed on all specimens: yes?	Serial number on device: [id number]

## 2020-05-20 NOTE — Progress Notes
[name] is a 69 y.o. male  who presents with a chief complaint of gross hematuria.  Patient recently in the hospital for chronic dyspnea and acute decompensated heart failure.  Has developed gross hematuria and lower abdominal pain.  [place] from September 20th showed a mild left hydronephrosis but no other abnormalities.Patient has had intermittent hematuria with clots over 3 years.  Has been evaluated previously by Dr. Modesto Charon at [facility] and complete diagnostic exam including cystoscopy was unremarkable.  Patient was just evaluated in the ED.  patient had called saying that he was having blood clots.  In the ED he was noted to have yellow to light red urine with no clots and was advised to come up to the clinic for cystoscopic exam.[place] ABDOMEN PELVIS W IV CONTRASTHISTORY: History of stage IV non-small cell lung cancer, presents with 3 days of abdominal pain and hematuria.  COMPARISON: [place] ABDOMEN W IV CONTRAST 2021-Jul-31TECHNIQUE:  images of the abdomen and pelvis were obtained from the diaphragms to the pubic symphysis after the administration of 80 cc intravenous contrast (Omnipaque 350).  FINDINGS:LUNG BASES: There are numerous nodular lung lesions, which have increased in number and size. LIVER: Unremarkable.GALLBLADDER: Patient is status post cholecystectomy.SPLEEN: Unremarkable.PANCREAS: Unremarkable.ADRENALS: Similar appearance of subcentimeter right adrenal nodule.KIDNEYS: There is perinephritic fat stranding and mild hydronephrosis on the left side, fat stranding is more prominent compared to prior scan. Stable appearance of right renal cyst. The left kidney is atrophic. BOWEL: There is no bowel obstruction or ascites.APPENDIX: Unremarkable.PERITONEUM: No intraperitoneal free fluid. No intraperitoneal free air. A left omental nodule measures 9 mm image 18 series 2, which is stable compared to prior. LYMPH NODES: Previously noted retrocrural lymph nodes are relatively stable in size and number. For example a 8 mm right retrocrural node image 18 series 2 is the same as previous.  Subcentimeter aortocaval lymph nodes are again noted and are stable. VESSELS: There is mild atherosclerotic disease in the abdominal aorta and right common iliac artery. URINARY BLADDER: Patient is status post placement of Foley catheter. Air is noted in the bladder. The bladder wall is thickened and the level of left ureteral orifice with soft tissue appearance which is inseparable from the Foley reservoir. The left ureter is slightly prominent and edematous. PELVIS: A 5 cm stool ball is noted in the rectum. BONES & SOFT TISSUE: There is a small fat-containing umbilical hernia with subcutaneous edema. Degenerative changes are seen in the lower thoracic vertebral bodies. No acute or aggressive osseous abnormalities noted. IMPRESSION: 1. Worsening mild left hydroureteronephrosis with possible superimposed infection.2. Decompressed bladder revealing wall thickening/soft tissue appearance at the location of left ureteral orifice. Recommend ultrasound evaluation with distended urinary bladder.3. Metastatic lung disease, worsening. Report Initiated By:  Assunta Found, MD Reported And Signed By: Elray Buba, MDPast Medical HistoryPast Medical History: Diagnosis Date ? Anxiety  ? CAD (coronary artery disease)  ? Diabetes mellitus (HC Code)  ? GERD (gastroesophageal reflux disease)  ? Hyperlipidemia  ? Hypertension  ? Hypertrophy of prostate without urinary obstruction and other lower urinary tract symptoms (LUTS)  ? Other testicular hypofunction  ? Spinal stenosis 09/28/2018 ? Tobacco abuse  ? Vitamin D deficiency  Past Surgical HistoryPast Surgical History: Procedure Laterality Date ? CAROTID STENT  08/28/2003 ? CHOLECYSTECTOMY  11/07/2016 ? KNEE SURGERY    torn meniscus ? LAMINECTOMY  10/07/2008 ? LUMBAR DISC SURGERY    herniated disc AllergiesNo Known AllergiesMedicationsOutpatient Encounter Medications as of 05/19/2020 Medication Sig Dispense Refill ? aspirin 81  MG EC tablet Take 81 mg by mouth daily.   ? atorvastatin (LIPITOR) 80 mg tablet Take 1 tablet (80 mg total) by mouth nightly. 30 tablet 6 ? carboxymethylcellulose (REFRESH PLUS) 0.5 % ophthalmic solution in dropperette Place 1 drop into both eyes 3 (three) times daily as needed.   ? clopidogreL (PLAVIX) 75 mg tablet Take 1 tablet (75 mg total) by mouth daily. 30 tablet 6 ? escitalopram oxalate (LEXAPRO) 10 mg tablet Take 1 tablet (10 mg total) by mouth nightly. 90 tablet 3 ? fexofenadine (ALLEGRA) 180 mg tablet Take 180 mg by mouth daily as needed.    ? furosemide (LASIX) 40 mg tablet Take 1 tablet (40 mg total) by mouth daily. 30 tablet 2 ? gabapentin (NEURONTIN) 300 mg capsule Take 2 capsules (600 mg total) by mouth at bedtime. 60 capsule 0 ? lidocaine-prilocaine (EMLA) 2.5-2.5 % cream Apply topically as needed. 30 g 0 ? metoprolol succinate XL (TOPROL-XL) 25 mg 24 hr tablet Take 0.5 tablets (12.5 mg total) by mouth daily. Take with or immediately following a meal. 30 tablet 0 ? multivitamin capsule Take 1 capsule by mouth daily.    ? oxybutynin XL (DITROPAN-XL) 5 mg 24 hr tablet Take 1 tablet (5 mg total) by mouth daily. 90 tablet 0 ? oxyCODONE (ROXICODONE) 5 mg Immediate Release tablet Take 1 tablet (5 mg total) by mouth every 4 (four) hours as needed for up to 5 doses. 5 tablet 0 ? polyethylene glycol (GLYCOLAX; MIRALAX) 17 gram/dose powder Take by mouth.   ? predniSONE (DELTASONE) 10 mg tablet Take 13 tablets (130 mg total) by mouth daily for 7 days, THEN 12 tablets (120 mg total) daily for 7 days, THEN 11 tablets (110 mg total) daily for 7 days, THEN 10 tablets (100 mg total) daily for 7 days, THEN 9 tablets (90 mg total) daily for 7 days, THEN 8 tablets (80 mg total) daily for 7 days, THEN 7 tablets (70 mg total) daily for 7 days, THEN 6 tablets (60 mg total) daily for 7 days, THEN 5 tablets (50 mg total) daily for 7 days, THEN 4 tablets (40 mg total) daily for 7 days. Take with food.. 595 tablet 0 ? [START ON 06/28/2020] predniSONE (DELTASONE) 10 mg tablet Take 3 tablets (30 mg total) by mouth daily for 7 days, THEN 2 tablets (20 mg total) daily for 7 days, THEN 1 tablet (10 mg total) daily for 7 days, THEN 0.5 tablets (5 mg total) daily for 7 days, THEN 0.5 tablets (5 mg total) daily for 7 days. Take with food.. 49 tablet 0 ? [DISCONTINUED] albuterol sulfate 90 mcg/actuation HFA aerosol inhaler    ? [DISCONTINUED] tumeric-ging-olive-oreg-capryl 100 mg-150 mg- 50 mg-150 mg Cap Take by mouth.   Facility-Administered Encounter Medications as of 05/19/2020 Medication Dose Route Frequency Provider Last Rate Last Admin ? [COMPLETED] fosfomycin (MONUROL) packet 3 g  3 g Oral Once Bernadene Person., MD   3 g at 05/19/20 1509 ? [COMPLETED] lidocaine uro-jet (XYLOCAINE) 2 % jelly 11 mL  11 mL INTRA-URETHRAL Once Bernadene Person., MD   11 mL at 05/19/20 1509 ? [COMPLETED] morphine injection 4 mg  4 mg IV Push Once Roderick Pee, MD   4 mg at 05/19/20 1234  Social HistorySocial History Tobacco Use ? Smoking status: Current Every Day Smoker   Types: Cigarettes Substance Use Topics ? Alcohol use: Not on file ? Drug use: Not on file  Family History History reviewed. No pertinent family history.Physical ExamBP  131/76  - Pulse 76  - Temp 97.6 ?F (36.4 ?C) (No-touch scanner)  - Resp 18  - Ht 6' (1.829 m)  - Wt 136.1 kg  - SpO2 96%  - BMI 40.69 kg/m? Constitutional: Oriented to person, place, and time. Appears well-developed and well-nourished. HENT: Head: Normocephalic and atraumatic. Eyes: Conjunctivae are normal. Neck: No tracheal deviation present. Pulmonary/Chest: Effort normal. Abdominal: Soft and non-tenderMusculoskeletal: Normal range of motion. Neurological: alert and oriented to person, place, and time. No focal deficits are evident. Skin: Skin is dry. Psychiatric: Has a normal mood and affect. Behavior is normal. Judgment and thought content normal. Lab Results Component Value Date  UCOLOR yellow 04/29/2020  UGLUCOSE Negative 04/29/2020  UKETONE Positive 04/29/2020  USPECGRAVITY 1.030 04/29/2020  UPROTEIN Positive 04/29/2020  UNITRATES Negative 04/29/2020  UBLOOD 3+ 04/29/2020  ULEUKOCYTES Negative 04/29/2020    Post Void Bladder Scan Measurement Date Value Ref Range Status 05/17/2020 0 mL Final After time out patient underwent a flexible cystoscopy.  Penile urethra was unremarkable.  Prostatic urethra visually was occlusive.  Upon entering the bladder there were erythematous changes on the posterior bladder wall consistent with an indwelling catheter.  A blood clot was on the left bladder wall obscuring vision on the left side.  No tumors were found but exam was not completely because of the patient's blood clot.  A 22 French catheter was passed in an attempt was made to irrigate the clot but the patient was uncomfortable and the clot apparently was to organized and did not break up. Irrigation was clear to light rose'. Sixteen French Foley catheter was placed back into the bladder at patient request.  Hopefully the clot will lyse over time.  May need cystoscopy in the OR with bladder irrigation.Assessment & Plan:Paul Hunter is a 69 y.o. male with metastatic lung cancer, gross hematuria complicated by urinary retention.  No active bleeding noted today at the time of cystoscopy.  Patient advised to call or go to the ED if he has difficulty with his catheter draining.  To maintain a high fluid intake.  Encounter Diagnoses Name SNOMED [place](R) Primary? ? Hematuria, unspecified type BLOOD IN URINE Yes ? Urinary retention RETENTION OF URINE  No orders of the defined types were placed in this encounter.Return in about 2 weeks (around 06/02/2020).Patient will let us know if any new problems or symptoms arise in the Melvern Sample, MD

## 2020-05-21 ENCOUNTER — Inpatient Hospital Stay
Admit: 2020-05-21 | Discharge: 2020-05-25 | Payer: PRIVATE HEALTH INSURANCE | Source: Home / Self Care | Admitting: Medical Oncology

## 2020-05-21 ENCOUNTER — Encounter: Admit: 2020-05-21 | Payer: PRIVATE HEALTH INSURANCE | Attending: Family

## 2020-05-21 DIAGNOSIS — D494 Neoplasm of unspecified behavior of bladder: Secondary | ICD-10-CM

## 2020-05-21 DIAGNOSIS — R590 Localized enlarged lymph nodes: Secondary | ICD-10-CM

## 2020-05-21 DIAGNOSIS — T839XXA Unspecified complication of genitourinary prosthetic device, implant and graft, initial encounter: Secondary | ICD-10-CM

## 2020-05-21 DIAGNOSIS — C3490 Malignant neoplasm of unspecified part of unspecified bronchus or lung: Secondary | ICD-10-CM

## 2020-05-21 DIAGNOSIS — R911 Solitary pulmonary nodule: Secondary | ICD-10-CM

## 2020-05-21 DIAGNOSIS — N3091 Cystitis, unspecified with hematuria: Secondary | ICD-10-CM

## 2020-05-21 DIAGNOSIS — J439 Emphysema, unspecified: Secondary | ICD-10-CM

## 2020-05-21 DIAGNOSIS — R319 Hematuria, unspecified: Secondary | ICD-10-CM

## 2020-05-21 LAB — URINALYSIS-MACROSCOPIC W/REFLEX MICROSCOPIC
BKR BILIRUBIN, UA: NEGATIVE
BKR GLUCOSE, UA: NEGATIVE
BKR KETONES, UA: NEGATIVE mmol/L — ABNORMAL LOW (ref 136–144)
BKR LEUKOCYTE ESTERASE, UA: NEGATIVE
BKR NITRITE, UA: NEGATIVE
BKR PH, UA: 6.5 (ref 5.5–7.5)
BKR SPECIFIC GRAVITY, UA: 1.012 (ref 1.005–1.030)
BKR UROBILINOGEN, UA: <2 EU/dL — ABNORMAL HIGH (ref <=2.0)

## 2020-05-21 LAB — CBC WITH AUTO DIFFERENTIAL
BKR AST/ALT RATIO: 0.1 x 1000/??L (ref 0.0–0.3)
BKR PROTEIN, UA: 11.5 x1000/??L — ABNORMAL HIGH (ref 4.0–10.0)
BKR WAM ABSOLUTE IMMATURE GRANULOCYTES: 0.1 x 1000/ÂµL (ref 0.0–0.3)
BKR WAM ABSOLUTE LYMPHOCYTE COUNT: 0.6 x 1000/??L — ABNORMAL LOW (ref 1.0–4.0)
BKR WAM ABSOLUTE NRBC: 0 x 1000/??L (ref 0.0–0.0)
BKR WAM ANALYZER ANC: 10.7 x 1000/ÂµL (ref 1.0–11.0)
BKR WAM BASOPHIL ABSOLUTE COUNT: 0 x 1000/ÂµL (ref 0.0–0.0)
BKR WAM BASOPHILS: 0.1 % — ABNORMAL HIGH (ref 0.0–4.0)
BKR WAM EOSINOPHIL ABSOLUTE COUNT: 0 x 1000/ÂµL (ref 0.0–1.0)
BKR WAM EOSINOPHILS: 0.1 % (ref 0.0–7.0)
BKR WAM HEMOGLOBIN: 11.5 g/dL — ABNORMAL LOW (ref 12.0–18.0)
BKR WAM IMMATURE GRANULOCYTES: 1 % (ref 0.0–3.0)
BKR WAM LYMPHOCYTES: 5 % — ABNORMAL LOW (ref 8.0–49.0)
BKR WAM MCH (PG): 31.7 pg — ABNORMAL HIGH (ref 27.0–31.0)
BKR WAM MCHC: 33 g/dL (ref 31.0–36.0)
BKR WAM MCV: 95.9 fL — ABNORMAL HIGH (ref 78.0–94.0)
BKR WAM MONOCYTE ABSOLUTE COUNT: 0.2 x 1000/??L (ref 0.0–2.0)
BKR WAM MONOCYTES: 1.5 % — ABNORMAL LOW (ref 4.0–15.0)
BKR WAM MPV: 9.7 fL (ref 6.0–11.0)
BKR WAM NEUTROPHILS: 92.3 % — ABNORMAL HIGH (ref 37.0–84.0)
BKR WAM NUCLEATED RED BLOOD CELLS: 0 % (ref 0.0–1.0)
BKR WAM PLATELETS: 200 x1000/ÂµL (ref 140–440)
BKR WAM RDW-CV: 15.4 % — ABNORMAL HIGH (ref 11.5–14.5)
BKR WAM RED BLOOD CELL COUNT: 3.6 M/??L — ABNORMAL LOW (ref 3.8–5.9)
BKR WAM WHITE BLOOD CELL COUNT: 11.5 x1000/ÂµL — ABNORMAL HIGH (ref 4.0–10.0)

## 2020-05-21 LAB — COMPREHENSIVE METABOLIC PANEL
BKR A/G RATIO: 1.2 (ref 1.0–2.2)
BKR ALANINE AMINOTRANSFERASE (ALT): 44 U/L (ref 9–59)
BKR ALBUMIN: 3.2 g/dL — ABNORMAL LOW (ref 3.6–4.9)
BKR ALKALINE PHOSPHATASE: 68 U/L (ref 9–122)
BKR ANION GAP: 13 % — ABNORMAL HIGH (ref 7–17)
BKR ASPARTATE AMINOTRANSFERASE (AST): 18 U/L (ref 10–35)
BKR BILIRUBIN TOTAL: 0.2 mg/dL (ref <=1.2)
BKR BLOOD UREA NITROGEN: 26 mg/dL — ABNORMAL HIGH (ref 8–23)
BKR BUN / CREAT RATIO: 18.2 (ref 8.0–23.0)
BKR CALCIUM: 8.3 mg/dL — ABNORMAL LOW (ref 8.8–10.2)
BKR CHLORIDE: 102 mmol/L (ref 98–107)
BKR CO2: 24 mmol/L (ref 20–30)
BKR CREATININE: 1.43 mg/dL — ABNORMAL HIGH (ref 0.40–1.30)
BKR EGFR (AFR AMER): 60 mL/min/1.73m2 (ref >60)
BKR EGFR (NON AFRICAN AMERICAN): 49 mL/min/{1.73_m2} (ref >60)
BKR GLOBULIN: 2.6 g/dL (ref 2.3–3.5)
BKR GLUCOSE: 147 mg/dL — ABNORMAL HIGH (ref 70–100)
BKR POTASSIUM: 3.9 mmol/L (ref 3.3–5.1)
BKR PROTEIN TOTAL: 5.8 g/dL — ABNORMAL LOW (ref 6.6–8.7)
BKR PROTHROMBIN TIME: 1.43 mg/dL — ABNORMAL HIGH (ref 0.40–1.30)
BKR SODIUM: 139 mmol/L (ref 136–144)
BKR WAM HEMATOCRIT: 3.9 mmol/L — ABNORMAL LOW (ref 3.3–5.1)

## 2020-05-21 LAB — PT/INR AND PTT (BH GH L LMW YH)
BKR INR: 0.9 mg/dL — ABNORMAL LOW (ref 0.87–1.14)
BKR PARTIAL THROMBOPLASTIN TIME: 23.1 s (ref 23.0–31.4)

## 2020-05-21 LAB — URINE MICROSCOPIC     (BH GH LMW YH)
BKR HYALINE CASTS, UA INSTRUMENT (NUMERIC): 3 /LPF (ref 0–3)
BKR RBC/HPF INSTRUMENT: 731 /HPF — ABNORMAL HIGH (ref 0–2)
BKR WBC/HPF INSTRUMENT: 2 /HPF (ref 0–5)

## 2020-05-21 MED ORDER — FUROSEMIDE 20 MG TABLET
20 mg | Freq: Every day | ORAL | Status: DC
Start: 2020-05-21 — End: 2020-05-26
  Administered 2020-05-24 – 2020-05-25 (×2): 20 mg via ORAL

## 2020-05-21 MED ORDER — METOPROLOL SUCCINATE ER (TOPROL) 12.5 MG HALFTAB
12.5 mg | Freq: Every day | ORAL | Status: DC
Start: 2020-05-21 — End: 2020-05-26
  Administered 2020-05-22 – 2020-05-25 (×4): 12.5 mg via ORAL

## 2020-05-21 MED ORDER — SODIUM CHLORIDE 0.9 % (FLUSH) INJECTION SYRINGE
0.9 % | INTRAVENOUS | Status: DC | PRN
Start: 2020-05-21 — End: 2020-05-26

## 2020-05-21 MED ORDER — MORPHINE 4 MG/ML INTRAVENOUS SOLUTION
4 mg/mL | Freq: Once | INTRAVENOUS | Status: DC
Start: 2020-05-21 — End: 2020-05-21

## 2020-05-21 MED ORDER — ROSUVASTATIN 20 MG TABLET
20 mg | Freq: Every day | ORAL | 7 refills | Status: DC
Start: 2020-05-21 — End: 2020-05-26
  Administered 2020-05-22 – 2020-05-25 (×4): 20 mg via ORAL

## 2020-05-21 MED ORDER — CEFTRIAXONE IV PUSH 1000 MG VIAL & STERILE WATER (ADULTS)
Freq: Once | INTRAVENOUS | Status: CP
Start: 2020-05-21 — End: ?
  Administered 2020-05-21: 10.000 mL via INTRAVENOUS

## 2020-05-21 MED ORDER — ASPIRIN 81 MG TABLET,DELAYED RELEASE
81 mg | Freq: Every day | ORAL | Status: DC
Start: 2020-05-21 — End: 2020-05-22
  Administered 2020-05-22: 13:00:00 81 mg via ORAL

## 2020-05-21 MED ORDER — SODIUM CHLORIDE 0.9 % (FLUSH) INJECTION SYRINGE
0.9 % | Freq: Three times a day (TID) | INTRAVENOUS | Status: DC
Start: 2020-05-21 — End: 2020-05-26
  Administered 2020-05-22: 09:00:00 0.9 mL via INTRAVENOUS

## 2020-05-21 MED ORDER — ZZ IMS TEMPLATE
Freq: Every day | ORAL | Status: DC
Start: 2020-05-21 — End: 2020-05-26

## 2020-05-21 MED ORDER — OXYBUTYNIN CHLORIDE ER 5 MG TABLET,EXTENDED RELEASE 24 HR
5 mg | Freq: Every day | ORAL | Status: DC
Start: 2020-05-21 — End: 2020-05-26
  Administered 2020-05-22 – 2020-05-25 (×4): 5 mg via ORAL

## 2020-05-21 MED ORDER — ESCITALOPRAM 10 MG TABLET
10 mg | Freq: Every evening | ORAL | Status: DC
Start: 2020-05-21 — End: 2020-05-26
  Administered 2020-05-22 – 2020-05-25 (×4): 10 mg via ORAL

## 2020-05-21 MED ORDER — GABAPENTIN 300 MG CAPSULE
300 mg | Freq: Every evening | ORAL | Status: DC
Start: 2020-05-21 — End: 2020-05-26
  Administered 2020-05-22 – 2020-05-25 (×4): 300 mg via ORAL

## 2020-05-21 MED ORDER — HYDROMORPHONE 2 MG/ML INJECTION SOLUTION
2 mg/mL | Freq: Once | INTRAVENOUS | Status: DC | PRN
Start: 2020-05-21 — End: 2020-05-22

## 2020-05-21 MED ORDER — PREDNISONE 50 MG TABLET
50 mg | Freq: Every day | ORAL | Status: CP
Start: 2020-05-21 — End: ?
  Administered 2020-05-22 – 2020-05-25 (×4): 50 mg via ORAL

## 2020-05-21 MED ORDER — HYDROMORPHONE 2 MG/ML INJECTION SOLUTION
2 mg/mL | Freq: Once | INTRAVENOUS | Status: CP
Start: 2020-05-21 — End: ?
  Administered 2020-05-21: 23:00:00 2 mL via INTRAVENOUS

## 2020-05-21 NOTE — ED Provider Notes
PGY-1 Emergency Medicine Resident MDM: -----------------Vital Signs: BP (!) 160/78  - Pulse 65  - Temp 97.9 ?F (36.6 ?C) (Oral)  - Resp 18  - Ht 5' 10 (1.778 m)  - Wt (!) 140.9 kg  - SpO2 94%  - BMI 44.57 kg/m? Presentation:The patient is a 69 y.o. male with a hx of cad with stents (Plavix, ASA), htn, dm, hl, stage IV lung CA (on chemo), with urology discharge earlier today following foley placement s/p cystoscopy for chronic hematuria now presents with 10/10 penile pain located at the tip of his glans. The patient recently was seen in the ED today for chronic hematuria w/ new clots and was seen by urology and recommended foley placement with outpatient cystoscopy that was done at 3pm today. The patient started to have intense penile pain and noticed his foley was not draining since the start of his pain. He presents with a urinary bag with dark red colored urine. His pain is located at the tip of his glans.  Pertinent exam findings include: VS: tachy 106Gen: AOx3, in distress s/s to pain, moving around in bedGU: Foley in place, no blood around urethral meatus, no scrotal tenderness, soft testes b/l. 10-20cc of frank blood in foley bag. Glans is mildly edematous and 10/10 tender to touch. Able to manually retract foreskin though severly tender when performingCV: RRRPulm: CTABLAbd: soft, non-tender, non-distendedExt: no LE edemaDDx: testicular tosion, paraphimosis, phimosis, urinary retention s/s to obstruction, urethrall dissectionPlan: morphine 4mg , dilaudid 1mg  --> urology c/sCourse: bladder scan 280cc, larger french foley being placed by urologyDispo: Dispo per urologyThis patient was presented to and discussed with Dr. Sherron Flemings and a treatment plan and disposition were collaboratively agreed upon.Michel Bickers MDYale Emergency MedicinePGY-1MHB: [id number] Complaint Patient presents with ? Urinary Catheter Problem   From home with c/o foley catheter issue. Foley was placed today along with a cystoscopy. +hematuria for over a week. Around 5pm with penile pain and decreased foley output. Urine is leaking around the catheter. +bladder spasms.  ? Hematuria  The history is provided by the patient and medical records. No language interpreter was used. OtherThis is a new problem. The current episode started 3 to 5 hours ago. The problem has been gradually worsening.  Past Medical History: Diagnosis Date ? Anxiety  ? CAD (coronary artery disease)  ? Diabetes mellitus (HC Code)  ? GERD (gastroesophageal reflux disease)  ? Hyperlipidemia  ? Hypertension  ? Hypertrophy of prostate without urinary obstruction and other lower urinary tract symptoms (LUTS)  ? Other testicular hypofunction  ? Spinal stenosis 09/28/2018 ? Tobacco abuse  ? Vitamin D deficiency  Past Surgical History: Procedure Laterality Date ? CAROTID STENT  08/28/2003 ? CHOLECYSTECTOMY  11/07/2016 ? KNEE SURGERY    torn meniscus ? LAMINECTOMY  10/07/2008 ? LUMBAR DISC SURGERY    herniated disc No family history on file.Social History Socioeconomic History ? Marital status: Divorced   Spouse name: Not on file ? Number of children: Not on file ? Years of education: Not on file ? Highest education level: Not on file Tobacco Use ? Smoking status: Current Every Day Smoker   Types: Cigarettes ED Other Social History E-cigarette/Vaping Substances E-cigarette/Vaping Devices Review of Systems Unable to perform ROS: Other Constitutional: Positive for activity change. HENT: Negative.  Eyes: Negative.  Respiratory: Negative.  Cardiovascular: Negative.  Gastrointestinal: Negative. Endocrine: Negative.  Genitourinary: Positive for decreased urine volume, difficulty urinating, flank pain, hematuria and penile pain. Skin: Negative.  Allergic/Immunologic: Negative.  Neurological: Negative.  Hematological: Negative.  Psychiatric/Behavioral: Negative.  All other systems reviewed and are negative. Physical ExamED Triage Vitals [05/19/20 2130]BP: (!) 183/84Pulse: (!) 106Pulse from  O2 sat: n/aResp: 20Temp: 98.3 ?F (36.8 ?C)Temp src: OralSpO2: 96 % BP (!) 160/78  - Pulse 65  - Temp 97.9 ?F (36.6 ?C) (Oral)  - Resp 18  - Ht 5' 10 (1.778 m)  - Wt (!) 140.9 kg  - SpO2 94%  - BMI 44.57 kg/m? Physical ExamVitals reviewed. Constitutional:     Appearance: Normal appearance. HENT:    Head: Normocephalic and atraumatic.    Mouth/Throat:    Mouth: Mucous membranes are moist. Eyes:    Extraocular Movements: Extraocular movements intact.    Pupils: Pupils are equal, round, and reactive to light. Cardiovascular:    Rate and Rhythm: Regular rhythm. Tachycardia present.    Pulses: Normal pulses.    Heart sounds: Normal heart sounds. Pulmonary:    Effort: Pulmonary effort is normal.    Breath sounds: Normal breath sounds. Abdominal:    General: Abdomen is flat.    Palpations: Abdomen is soft. Genitourinary:   Penis: Normal.     Testes: Normal.    Comments: Refer to GU exam as documentedMusculoskeletal:       General: Normal range of motion.    Cervical back: Normal range of motion. Skin:   General: Skin is warm.    Capillary Refill: Capillary refill takes less than 2 seconds. Neurological:    General: No focal deficit present.    Mental Status: He is alert and oriented to person, place, and time. Psychiatric:       Mood and Affect: Mood normal.       Behavior: Behavior normal.  ProceduresProcedures ED COURSEReviewed previous: previous chartConsults: UrologyPatient Reevaluation: Attending Supervised: ResidentI saw and examined the patient. I agree with the findings and plan of care as documented in the resident's note. Of note Patient with urinary catheter problem will consult urology.Cameron Proud MD MPH_____Attending Supervised: ResidentI saw and examined the patient. I agree with the findings and plan of care as documented in the resident's note.  Care signed at 11:00 p.m. pending urology consult for hematuria.Nada Libman SatherComments as of May 20 2212 Thu May 19, 2020 2309 Urology consult. Waiting on dispo per urology   [DG]  Comments User Index[DG] Garner Nash, MD   Clinical Impressions as of May 20 2212 Urinary catheter complication, initial encounter [facility] Code)  ED DispositionAdmit Inez Pilgrim, MD09/23/21 2308 Michel Bickers, MDResident09/23/21 2313 Cameron Proud, MD09/24/21 2213

## 2020-05-21 NOTE — Treatment Summary
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 (LVEF 68%), stage IV NSCLC; underwent recent PET stress 05/06/20 after presenting with ADHF that was high risk, w/+TID, 2 large reversible perfusion defects, LVEF of 40% at rest, drop to 36% on stress with [place] chest showing possible progression of disease. Plavix started cautiously at that time to see if he would tolerate  DAPT with his hematuria; unfortunately he represents with with recurrent hematuria leading to clot and urinary retention. His plavix was held and urology consulted cardiology for recommendations on timing of plavix resumption.-while he would ideally be on DAPT to assess whether he would be a candidate for PCI for his underlying CAD, his recurrent hematuria requiring hospitalization is precluding it at this time-if further work up and treatment could stem the recurrent hematuria and allow for safe re-initation of plavix this would be optimal as he would benefit from [facility] with possible revascularization, however implanting a stent in a scenario were DAPT would have to be stopped for bleeding would introduce increased risk of stent thrombosis -He has no current class I indication for DAPT (e.g. such as new stent or ACS within the last year), so it is okay to hold his plavix in the setting of his acute bleeding requiring hospitalization and can discharge patient on aspirin 81 with close outpatient follow up to reassess risk/benefit of restarting/re-trialling plavix with his cardiologist once bleeding under controlGregory Rubinfeld, MD Cardiology Fellow Cardiology Attending:I have discussed the plan of care with the fellow, Dr. Wyvonnia Dusky.  I have reviewed the note and agree with the documented assessment and plan. Jocelyn Lamer, MD, MScAssistant Professor of MedicineSection of Cardiovascular MedicineYale [facility] 05/21/2020

## 2020-05-22 ENCOUNTER — Inpatient Hospital Stay
Admit: 2020-05-22 | Payer: PRIVATE HEALTH INSURANCE | Attending: Student in an Organized Health Care Education/Training Program

## 2020-05-22 ENCOUNTER — Inpatient Hospital Stay: Admit: 2020-05-22 | Payer: PRIVATE HEALTH INSURANCE

## 2020-05-22 ENCOUNTER — Encounter: Admit: 2020-05-22 | Payer: PRIVATE HEALTH INSURANCE | Attending: Medical

## 2020-05-22 DIAGNOSIS — M4800 Spinal stenosis, site unspecified: Secondary | ICD-10-CM

## 2020-05-22 DIAGNOSIS — F419 Anxiety disorder, unspecified: Secondary | ICD-10-CM

## 2020-05-22 DIAGNOSIS — E785 Hyperlipidemia, unspecified: Secondary | ICD-10-CM

## 2020-05-22 DIAGNOSIS — T884XXA Failed or difficult intubation, initial encounter: Secondary | ICD-10-CM

## 2020-05-22 DIAGNOSIS — K219 Gastro-esophageal reflux disease without esophagitis: Secondary | ICD-10-CM

## 2020-05-22 DIAGNOSIS — I10 Essential (primary) hypertension: Secondary | ICD-10-CM

## 2020-05-22 DIAGNOSIS — N400 Enlarged prostate without lower urinary tract symptoms: Secondary | ICD-10-CM

## 2020-05-22 DIAGNOSIS — N3091 Cystitis, unspecified with hematuria: Secondary | ICD-10-CM

## 2020-05-22 DIAGNOSIS — E291 Testicular hypofunction: Secondary | ICD-10-CM

## 2020-05-22 DIAGNOSIS — I2510 Atherosclerotic heart disease of native coronary artery without angina pectoris: Secondary | ICD-10-CM

## 2020-05-22 DIAGNOSIS — E119 Type 2 diabetes mellitus without complications: Secondary | ICD-10-CM

## 2020-05-22 DIAGNOSIS — Z720 Tobacco use: Secondary | ICD-10-CM

## 2020-05-22 DIAGNOSIS — E559 Vitamin D deficiency, unspecified: Secondary | ICD-10-CM

## 2020-05-22 LAB — BASIC METABOLIC PANEL
BKR ANION GAP: 10 M/??L — ABNORMAL LOW (ref 7–17)
BKR BLOOD UREA NITROGEN: 27 mg/dL — ABNORMAL HIGH (ref 8–23)
BKR BUN / CREAT RATIO: 19 (ref 8.0–23.0)
BKR CALCIUM: 8.4 mg/dL — ABNORMAL LOW (ref 8.8–10.2)
BKR CHLORIDE: 106 mmol/L (ref 98–107)
BKR CO2: 25 mmol/L (ref 20–30)
BKR CREATININE: 1.42 mg/dL — ABNORMAL HIGH (ref 0.40–1.30)
BKR EGFR (AFR AMER): 60 mL/min/{1.73_m2} (ref >60)
BKR EGFR (NON AFRICAN AMERICAN): 50 mL/min/1.73m2 (ref >60)
BKR POTASSIUM: 4.2 mmol/L (ref 3.3–5.1)
BKR SODIUM: 141 mmol/L (ref 136–144)

## 2020-05-22 LAB — CBC WITH AUTO DIFFERENTIAL
BKR GLUCOSE: 11.2 g/dL — ABNORMAL LOW (ref 12.0–18.0)
BKR WAM ABSOLUTE IMMATURE GRANULOCYTES: 0.1 x 1000/??L (ref 0.0–0.3)
BKR WAM ABSOLUTE LYMPHOCYTE COUNT: 1.4 x 1000/??L (ref 1.0–4.0)
BKR WAM ABSOLUTE NRBC: 0 x 1000/??L (ref 0.0–0.0)
BKR WAM ANALYZER ANC: 8.6 x 1000/??L (ref 1.0–11.0)
BKR WAM BASOPHIL ABSOLUTE COUNT: 0 x 1000/??L (ref 0.0–0.0)
BKR WAM BASOPHILS: 0.1 % (ref 0.0–4.0)
BKR WAM EOSINOPHIL ABSOLUTE COUNT: 0 x 1000/??L (ref 0.0–1.0)
BKR WAM EOSINOPHILS: 0.4 % (ref 0.0–7.0)
BKR WAM HEMATOCRIT: 33.7 % — ABNORMAL LOW (ref 37.0–52.0)
BKR WAM HEMOGLOBIN: 11.2 g/dL — ABNORMAL LOW (ref 12.0–18.0)
BKR WAM IMMATURE GRANULOCYTES: 0.8 % (ref 0.0–3.0)
BKR WAM LYMPHOCYTES: 13.5 % (ref 8.0–49.0)
BKR WAM MCH (PG): 31.3 pg — ABNORMAL HIGH (ref 27.0–31.0)
BKR WAM MCHC: 33.2 g/dL — ABNORMAL LOW (ref 31.0–36.0)
BKR WAM MCV: 94.1 fL — ABNORMAL HIGH (ref 78.0–94.0)
BKR WAM MONOCYTE ABSOLUTE COUNT: 0.4 x 1000/??L (ref 0.0–2.0)
BKR WAM MONOCYTES: 4 % (ref 4.0–15.0)
BKR WAM MPV: 9.7 fL (ref 6.0–11.0)
BKR WAM NEUTROPHILS: 81.2 % (ref 37.0–84.0)
BKR WAM NUCLEATED RED BLOOD CELLS: 0 % (ref 0.0–1.0)
BKR WAM PLATELETS: 191 x1000/??L (ref 140–440)
BKR WAM RDW-CV: 15.3 % — ABNORMAL HIGH (ref 11.5–14.5)
BKR WAM RED BLOOD CELL COUNT: 3.6 M/ÂµL — ABNORMAL LOW (ref 3.8–5.9)
BKR WAM WHITE BLOOD CELL COUNT: 10.6 x1000/??L — ABNORMAL HIGH (ref 4.0–10.0)

## 2020-05-22 LAB — SARS COV-2 (COVID-19) RNA-~~LOC~~ LABS (BH GH LMW YH): BKR SARS-COV-2 RNA (COVID-19) (YH): NEGATIVE

## 2020-05-22 LAB — URINE CULTURE: BKR URINE CULTURE, ROUTINE: NO GROWTH

## 2020-05-22 MED ORDER — LIDOCAINE 2 % MUCOSAL JELLY IN APPLICATOR
2 % | Status: CP
Start: 2020-05-22 — End: ?

## 2020-05-22 MED ORDER — CEFAZOLIN 1 GRAM SOLUTION FOR INJECTION
1 gram | INTRAVENOUS | Status: DC | PRN
Start: 2020-05-22 — End: 2020-05-22
  Administered 2020-05-22: 18:00:00 1 gram via INTRAVENOUS

## 2020-05-22 MED ORDER — NICOTINE 7 MG/24 HR DAILY TRANSDERMAL PATCH
7 mg | Freq: Every day | TRANSDERMAL | Status: DC
Start: 2020-05-22 — End: 2020-05-23

## 2020-05-22 MED ORDER — LIDOCAINE (PF) 20 MG/ML (2 %) INTRAVENOUS SOLUTION
202 mg/mL (2 %) | INTRAVENOUS | Status: DC | PRN
Start: 2020-05-22 — End: 2020-05-22
  Administered 2020-05-22: 18:00:00 20 mg/mL (2 %) via INTRAVENOUS

## 2020-05-22 MED ORDER — PHENYLEPHRINE 1 MG/10 ML (100 MCG/ML) IN 0.9 % SOD.CHLORIDE IV SYRINGE
110100 mg/0 mL (00 mcg/mL) | INTRAVENOUS | Status: DC | PRN
Start: 2020-05-22 — End: 2020-05-22
  Administered 2020-05-22: 19:00:00 1 mg/0 mL (00 mcg/mL) via INTRAVENOUS

## 2020-05-22 MED ORDER — ONDANSETRON HCL (PF) 4 MG/2 ML INJECTION SOLUTION
42 mg/2 mL | INTRAVENOUS | Status: DC | PRN
Start: 2020-05-22 — End: 2020-05-22
  Administered 2020-05-22: 19:00:00 4 mg/2 mL via INTRAVENOUS

## 2020-05-22 MED ORDER — HYDROMORPHONE 0.5 MG/0.5 ML INJECTION SYRINGE
0.50.5 mg/ mL | INTRAVENOUS | Status: DC | PRN
Start: 2020-05-22 — End: 2020-05-22

## 2020-05-22 MED ORDER — BELLADONNA ALKALOIDS-OPIUM 16.2 MG-30 MG RECTAL SUPPOSITORY
Freq: Two times a day (BID) | RECTAL | Status: DC | PRN
Start: 2020-05-22 — End: 2020-05-26
  Administered 2020-05-22 – 2020-05-23 (×3): via RECTAL

## 2020-05-22 MED ORDER — SODIUM CHLORIDE 0.9 % IRRIGATION SOLUTION
0.9 % irrigation | Status: CP | PRN
Start: 2020-05-22 — End: ?
  Administered 2020-05-22 (×2): 0.9 % irrigation

## 2020-05-22 MED ORDER — FENTANYL (PF) 50 MCG/ML INJECTION SOLUTION
50 mcg/mL | INTRAVENOUS | Status: DC | PRN
Start: 2020-05-22 — End: 2020-05-22
  Administered 2020-05-22: 20:00:00 50 mL via INTRAVENOUS

## 2020-05-22 MED ORDER — LABETALOL 5 MG/ML INTRAVENOUS SOLUTION
5 mg/mL | INTRAVENOUS | Status: DC | PRN
Start: 2020-05-22 — End: 2020-05-22

## 2020-05-22 MED ORDER — FENTANYL (PF) 50 MCG/ML INJECTION SOLUTION
50 mcg/mL | Status: CP
Start: 2020-05-22 — End: ?

## 2020-05-22 MED ORDER — WATER FOR IRRIGATION, STERILE SOLUTION
Status: DC | PRN
Start: 2020-05-22 — End: 2020-05-22
  Administered 2020-05-22: 19:00:00

## 2020-05-22 MED ORDER — IOHEXOL 300 MG IODINE/ML INTRAVENOUS SOLUTION
300 mg iodine/mL | Status: DC | PRN
Start: 2020-05-22 — End: 2020-05-22
  Administered 2020-05-22: 19:00:00 300 mg iodine/mL

## 2020-05-22 MED ORDER — PROPOFOL 10 MG/ML INTRAVENOUS EMULSION
10 mg/mL | INTRAVENOUS | Status: DC | PRN
Start: 2020-05-22 — End: 2020-05-22
  Administered 2020-05-22 (×3): 10 mg/mL via INTRAVENOUS

## 2020-05-22 MED ORDER — GLYCOPYRROLATE 0.2 MG/ML INJECTION SOLUTION
0.2 mg/mL | INTRAVENOUS | Status: DC | PRN
Start: 2020-05-22 — End: 2020-05-22
  Administered 2020-05-22 (×2): 0.2 mg/mL via INTRAVENOUS

## 2020-05-22 MED ORDER — FENTANYL (PF) 50 MCG/ML INJECTION SOLUTION
50 mcg/mL | INTRAVENOUS | Status: DC | PRN
Start: 2020-05-22 — End: 2020-05-22
  Administered 2020-05-22: 21:00:00 50 mL via INTRAVENOUS

## 2020-05-22 MED ORDER — PHENAZOPYRIDINE 100 MG TABLET
100 mg | Freq: Once | ORAL | Status: CP
Start: 2020-05-22 — End: ?
  Administered 2020-05-22: 21:00:00 100 mg via ORAL

## 2020-05-22 MED ORDER — SENNOSIDES 8.6 MG-DOCUSATE SODIUM 50 MG TABLET
Freq: Every evening | ORAL | Status: DC
Start: 2020-05-22 — End: 2020-05-26
  Administered 2020-05-23 – 2020-05-24 (×2): via ORAL

## 2020-05-22 MED ORDER — DIMENHYDRINATE 5 MG/ML IN 0.9% SODIUM CHLORIDE
INTRAVENOUS | Status: DC | PRN
Start: 2020-05-22 — End: 2020-05-22

## 2020-05-22 MED ORDER — NEOSTIGMINE METHYLSULFATE 1 MG/ML INTRAVENOUS SOLUTION
1 mg/mL | INTRAVENOUS | Status: DC | PRN
Start: 2020-05-22 — End: 2020-05-22
  Administered 2020-05-22: 19:00:00 1 mg/mL via INTRAVENOUS

## 2020-05-22 MED ORDER — OXYCODONE IMMEDIATE RELEASE 5 MG TABLET
5 mg | Freq: Four times a day (QID) | ORAL | Status: DC | PRN
Start: 2020-05-22 — End: 2020-05-26
  Administered 2020-05-23 – 2020-05-25 (×3): 5 mg via ORAL

## 2020-05-22 MED ORDER — ONDANSETRON HCL (PF) 4 MG/2 ML INJECTION SOLUTION
42 mg/2 mL | INTRAVENOUS | Status: DC | PRN
Start: 2020-05-22 — End: 2020-05-22

## 2020-05-22 MED ORDER — LABETALOL 5 MG/ML INTRAVENOUS SOLUTION
5 mg/mL | Status: CP
Start: 2020-05-22 — End: ?

## 2020-05-22 MED ORDER — BELLADONNA ALKALOIDS-OPIUM 16.2 MG-30 MG RECTAL SUPPOSITORY
Status: CP
Start: 2020-05-22 — End: ?

## 2020-05-22 MED ORDER — ACETAMINOPHEN 1,000 MG/100 ML (10 MG/ML) INTRAVENOUS SOLUTION
10 mg/mL | INTRAVENOUS | Status: DC | PRN
Start: 2020-05-22 — End: 2020-05-22
  Administered 2020-05-22: 20:00:00 10 mg/mL via INTRAVENOUS

## 2020-05-22 MED ORDER — PHENAZOPYRIDINE 100 MG TABLET
100 mg | Status: CP
Start: 2020-05-22 — End: ?

## 2020-05-22 MED ORDER — LIDOCAINE 2 % MUCOSAL JELLY IN APPLICATOR
2 % | Freq: Once | URETHRAL | Status: CP
Start: 2020-05-22 — End: ?
  Administered 2020-05-22: 21:00:00 2 mL via URETHRAL

## 2020-05-22 MED ORDER — ROCURONIUM 10 MG/ML INTRAVENOUS SOLUTION
10 mg/mL | INTRAVENOUS | Status: DC | PRN
Start: 2020-05-22 — End: 2020-05-22
  Administered 2020-05-22 (×2): 10 mg/mL via INTRAVENOUS

## 2020-05-22 MED ORDER — LACTATED RINGERS INTRAVENOUS SOLUTION
INTRAVENOUS | Status: DC | PRN
Start: 2020-05-22 — End: 2020-05-22
  Administered 2020-05-22: 18:00:00 via INTRAVENOUS

## 2020-05-22 MED ORDER — FENTANYL (PF) 50 MCG/ML INJECTION SOLUTION
50 mcg/mL | INTRAVENOUS | Status: DC | PRN
Start: 2020-05-22 — End: 2020-05-22
  Administered 2020-05-22 (×4): 50 mcg/mL via INTRAVENOUS

## 2020-05-22 MED ORDER — SORBITOL 3 % IRRIGATION SOLUTION
3 % | Status: DC | PRN
Start: 2020-05-22 — End: 2020-05-22
  Administered 2020-05-22 (×2): 3 %

## 2020-05-22 NOTE — Other
Post Anesthesia Transfer of Care NotePatient: Paul Robling AstorinoProcedure(s) Performed: Procedure(s) (LRB):CYSTOSCOPY, CLOT EVACUATION, , TURBT, left retrograde pyelogram, left ureteroscopy , left ureteral tumor bx (N/A) Patient location: PACU Last Vitals: Vitals Value Taken Time BP 205/104 05/22/20 1608 Temp 36.2 ?C 05/22/20 1556 Pulse 80 05/22/20 1610 Resp 8 05/22/20 1610 SpO2 99 % 05/22/20 1610 Vitals shown include unvalidated device data.Level of consciousness: awake, alert  and orientedTransport Vital Signs:  Stable since the last set of recorded intra-operative vital signsComplications: noneIntra-operative Intake & Output and Antibiotics as per Anesthesia record and discussed with the RN.

## 2020-05-22 NOTE — Progress Notes
Pt referred to [facility] for urinary retention. Pt arrived to clinic via wheelchair, accompanied by niece. Upon arrival pt in distress w/ significant pain and writhing in wheelchair. Pt assisted to bed. Per pt and niece pt w/ frequent visits to ED and urology clinic for foley catheter occlusions and clot evacuation. Pt arrived w/ roughly in foley bag, reports last emptied around 1200. Pt's foley catheter irrigated RN. Irrigated w/ ~276ml sterile water w/ multiple, large, dark red clots evacuated. Pt reported feeling relief after irrigation. Pt's port accessed and labs obtained. IV dilaudid and ceftriaxone admin per mar. Urology at bedside for additional irrigation and assessment. Plan for pt to be admitted w/ OR cystoscopy tomorrow. 2230: Bed available on NP12, nursing report given to [facility]. Foley bag emptied for bright red urine prior to transporting pt to unit.

## 2020-05-22 NOTE — Other
-    CONSULT  REQUEST  DOCUMENTATION  -  [facility] NOTE  -  Type of consult: [facility] Cardiology     -  New Consult: [id number] Onalee Hua J Kolle /Location: 12256/12256-A / *Brief Clinical Question: 15NWG hx lung cancer, CAD s/p PCI with stents in 2014, recent PET stress c/w multivessel CAD (cardio-onc patient), admitted for cystoscopy tomorrow for hematuria with clots and urinary retention. Further cardiac optimization prior to procedure?/Callback Cell Phone: [phone]** / Please confirm receipt of this message by texting back ?OK?  -  1 - Mobile Heartbeat message sent to [facility] at 11:30 PM. Received response at 2350.  Casimiro Needle Abbiegail Landgren  05/21/2020  11:29 PM  Consult [facility] [phone]

## 2020-05-22 NOTE — Anesthesia Pre-Procedure Evaluation
Review of Systems/ Medical HistoryPatient summary, nursing notes, EKG/Cardiac Studies , Labs, pre-procedure vitals, height, weight and NPO status reviewed.No previous anesthesia concernsAnesthesia Evaluation:   No history of anesthetic complications  Estimated body mass index is 41.56 kg/m? as calculated from the following:  Height as of this encounter: 6' (1.829 m).  Weight as of this encounter: 139 kg. CC/HPI: This is a 69 y.o. male with pmhx of NSCLC on chemo, CAD (s/p PCI) with concern for worsening CAD iso chemo, sleep apnea, and recurrent hematuria scheduled for CYSTOSCOPY, CLOT EVACUATION, POSSIBLE FULGURATION POSSIBLE BLADDER RESECTION (N/A ).Past Medical HistoryNo date: AnxietyNo date: CAD (coronary artery disease)No date: Diabetes mellitus (HC Code)No date: GERD (gastroesophageal reflux disease)No date: HyperlipidemiaNo date: HypertensionNo date: Hypertrophy of prostate without urinary obstruction and other lower urinary tract symptoms (LUTS)No date: Other testicular hypofunction02/09/2018: Spinal stenosisNo date: Tobacco abuseNo date: Vitamin D deficiencyNKDALast airway: None per chart review Past Surgical History:  Past Surgical History:08/28/2003: CAROTID STENT03/14/2018: CHOLECYSTECTOMYNo date: KNEE SURGERY    Comment:  torn meniscus02/06/2009: LAMINECTOMYNo date: LUMBAR DISC SURGERY    Comment:  herniated discCardiovascular:Patient has a history of: hypertension.  Dyspnea at rest. -Exercise tolerance: <4 METS   SOB on 1 flight of stairs-Coronary Artery Disease: CAD, MI and cardiac stents (1 stent in 1997, 3 stents 2014) -Vascular Disease:  Negative   -Other Cardiovascular:  ECHO 04/2020: * Normal left ventricular size, systolic function and wall motion. Moderate concentric left ventricular hypertrophy.  LVEF calculated by biplane Simpson's was 68%.  Abnormal tissue Doppler suggestive of abnormal diastolic function.  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  Previous study ascending aorta size was 4.4cm* Inferior vena cava was not well visualized.* Compared with the prior study, dated 02/08/2020, there are changes noted.  Wall motion in inferior and inferoseptal walls noted on current study, though compared to prior image, no significant change in wall motion abnormality.Stress test 2021: * Highly abnormal PET myocardial perfusion study following pharmacologic vasodilation with regadenoson and at rest with high risk features.  Perfusion imaging was abnormal showing a large sized, moderate to severe intensity, reversible perfusion defect in the basal to apical inferior and inferolateral walls consistent with ischemia; and a medium to large sized, moderate intensity, reversible perfusion defect in the mid to apical anterior and anterolateral walls consistent with ischemia.  There is visual transient ischemic dilation  There was transient post-stress LV dysfunction, resting ejection fraction 40% and stress ejection fraction 36% with abnormal regional wall motion showing global hypokinesis and inferior akinesis.  The left ventricle was severely enlarged in size.  Stress electrocardiogram was abnormal due to ischemic ECG changes.  [place] performed for attenuation correction showed coronary artery calcification in the left main artery (moderate), left anterior descending artery (severe), diagonal artery (severe), left circumflex artery (mild), and right coronary artery (mild).    Non-cardiac findings of the [place] are described in a separate report from Radiology.  No prior study available for comparison.* Global myocardial blood flow was reduced at 0.78 ml/g/min during stress and normal at 0.68 ml/g/min during rest.  Global myocardial flow reserve was reduced at 1.15.* These findings highly suspicious for multi-vessel CAD. Respiratory:  Patient has shortness of breath.-Obstructive Sleep apnea:   yes, CPAP use.  -Lung Disorders: -COPD:  yesHEENT: Negative.Neuromuscular:  Patient has a history of no  seizures.-Intracranial disorders:  He did not have a cerebrovascular accident-Neuropathy: peripheral neuropathySpine/Spinal Cord Disorders:  History of degenerative disc disease.Gastrointestinal/Genitourinary: -Gastrointestinal Disorders:  Patient has GERD.Hematological/Lymphatic: -Anemia: Patient has anemia and acute blood loss anemia.  Endocrine/Metabolic: -Diabetes mellitus:  Patient has diabetes mellitus (pateint states he has never been diagnosed with diabetes).Behavioral/Psychiatric & Syndromes:  Patient has depression.Additional Findings: VITAL SIGNS:Temp:  (36.4 ?C-37.2 ?C) 36.5 ?CPulse:  (70-98) 79Resp:  (16-20) 18BP: (124-179)/(64-96) 173/89SpO2:  (93 %-98 %) 95 %Device (Oxygen Therapy): room airLABS:Lab Results     Component                Value               Date                     WBC                      10.6 (H)            05/22/2020               HGB                      11.2 (L)            05/22/2020               HCT                      33.7 (L)            05/22/2020               PLT                      191                 05/22/2020               CHOL                     141                 05/11/2020               TRIG                     133                 05/11/2020               HDL                      39 (L)              05/11/2020               LDL                      75                  05/11/2020               ALT                      44  05/21/2020               AST                      18                  05/21/2020               NA 141                 05/22/2020               K                        4.2                 05/22/2020               CL                       106                 05/22/2020               CREATININE               1.42 (H)            05/22/2020               BUN                      27 (H)              05/22/2020               CO2                      25                  05/22/2020               TSH                      0.902               05/11/2020               INR                      0.90                05/21/2020               GLU                      131 (H)             05/22/2020               HGBA1C                   5.6                 04/08/2020          Physical ExamCardiovascular:    normal exam  Rhythm: regularHeart Sounds: S1 present and S2 present.Pulmonary:  decreased breath sounds and wheezing (scant bilateral wheezes L worse than R, not diffuse, no respiratory distress)Airway:  Mallampati: IIITM distance: >3 FBNeck ROM: fullDental:  normal exam  Anesthesia PlanASA 3 The primary anesthesia plan is  general ETT. SASAM, PIV, Multimodal Analgesia, Post op PACUPerioperative Code Status confirmed: It is my understanding that the patient is currently designated as 'Full Code' and will remain so throughout the perioperative period.Anesthesia informed consent obtained. Consent obtained from: patientUse of blood products: consented  The post operative pain plan is IV analgesics and per surgeon management.Plan discussed with Attending and Resident.Anesthesiologist's Pre Op NoteI personally evaluated and examined the patient prior to the intra-operative phase of care.

## 2020-05-22 NOTE — Other
[facility]             Operative Note    CONFIDENTIAL - DO NOT COPY WITHOUT APPROPRIATE AUTHORIZATION     Name: Paul Hunter  MRN: [id number]  CSN: [id number]  Date of Birth: Nov 16, 1950   Date of Adm: 05/21/2020    Date of Procedure/Surgery: 05/22/2020    Operation:   Cystoscopy, clot evacuation, transurethral resection of bladder tumor, left retrograde pyelogram, left ureteroscopy, biopsy of left ureteral tumor    Pre-Operative Diagnosis:   Gross hematuria    Post-Operative Diagnosis:   Bladder tumor  Left proximal ureteral tumor    Surgeon:   Attending: Catalina Antigua., MD    Assistant:   Resident: Marchelle Gearing, MD; Cleotis Lema, MD    Anesthesia:   ANESTHESIOLOGIST: Nathanial Rancher, MD  Anesthesia Resident: Katina Degree, MD    Estimated Blood Loss:   15 ml    IV Fluids:   Per record    Urine Output:   Per record    Specimens:   1. Left trigone and left lateral wall bladder tumor  2. Left ureteral tumor    Drains/Devices:  1. 22 Fr Foley catheter    Findings:   3 x 4 cm papillary tumor involving the left trigone and left lateral bladder wall  Left ureteral orifice obscured by tumor. Old blood clots and blood effluxing from left ureteral orifice once revealed.  Left proximal ureteral obstruction on left retrograde pyelogram (no contrast advancing past left proximal ureter).  Unable to advance 5 Fr ureteral catheter past point of obstruction  Papillary proximal left ureteral tumor  Trabeculated bladder    Complications:   no complications were noted    Disposition: Floor    Indications:   Paul Hunter is a 69 y.o. gentleman with recurrent gross hematuria with previous negative work up who was admitted with clot retention.     Procedure: Informed consent was confirmed. The patient was transferred to the operating room. A time out compliant with Universal Protocol was performed with the patient awake and participating. General anesthesia was induced. Prophylactic antibiotics were administered in the form of ancef. DVT prophylaxis was used in the form of sequential compression devices. The patient was positioned in dorsal lithotomy. The surgical field was sterilely prepared and draped.     Prior to starting the procedure, an additional time out was performed. A 22 Fr 30 degree cystoscope was atraumatically inserted into the urethra and advanced into the bladder. Systematic cystoscopy was performed. A 3 x 4 cm papillary tumor involving the left trigone and left lateral bladder wall was visualized. The left ureteral orifice was obscured by the tumor. The right UO was in orthotopic position and not involved with the tumor. The bladder was trabeculated. No tumors or other abnormalities were noted. The cystoscope was removed.    A 26 Fr resectoscope was inserted into the urethra and advanced into the bladder with an obturator. The resectoscope was used to resect all tumor in its entirety. Muscle was present in the resection and there was no evidence of perforation. The left ureteral orifice was revealed and noted to efflux bloody urine and old clots. The resection bed edges and any areas of bleeding were fulgurated taking care not to injure the left UO. The tumor chips were collected and sent for pathology. Hemostasis was confirmed. The resectoscope was removed.     The cystoscope was advanced into the bladder and  the left ureter was cannulated with a 5 Fr ureteral catheter with the assistance of a sensor wire. Left retrograde pyelogram was performed and the contrast was noted to stop at the proximal ureter. The ureteral catheter and contrast were unable to advance past this point concerning for obstruction. A sensor and super stiff wire were advanced into the left ureter. The cystoscope was removed and a flexible ureteroscope was advanced over the super stiff wire to the level of obstruction in the proximal left ureter. The wire was removed. A papillary proximal left ureteral tumor was encountered almost completely obstructing the ureteral lumen. Biopsy forceps were used to take 3 biopsies of the tumor, which were collected for pathology. There was no evidence of perforation. The ureteroscope was removed.     The resectoscope was again advanced into the bladder. The left ureter was noted to be intact and effluxing urine. Given the inability to advance past the obstruction, we were unable to place a stent. The sensor wire was removed. All areas of bleeding were fulgurated and the resectoscope was removed. A 22 Fr foley was placed with immediate return of clear irrigant. It irrigated easily clear pink irrigant.    The patient was awakened from anesthesia and transferred to the recovery room in stable, satisfactory condition. There were no immediate complications.    Dr. Malen Gauze, Jabier Mutton., MD was present and scrubbed for the entirety of the procedure.    PLAN:   - Follow up pathology  - Hold plavix for at least 48 hours  - Plan for voiding trial on 05/25/20

## 2020-05-22 NOTE — Progress Notes
Urologic Surgery Progress Note    Attending Provider: Camila Li, MD    Admit Date: 05/21/2020 Hospital Day: 2  Code status: Full Code/ACLS  Allergy: Patient has no known allergies.    Background:    Paul Hunter is a 69 y.o. male w/ PMH with NSCLC on chemotherapy, CAD (s/p PCI on ASA/Plavix), sleep apnea on CPAP, nephrolithiasis, and recurrent hematuria.    Subjective/Interim Events:     - Irrigated manually overnight with patency of catheter after removal of clot  - This AM, Foley catheter draining pink tinged urine without clots  - Discussed with patient possibility of requiring operative intervention depending on progression of hematuria    Objective:   Temp:  [97.5 ?F (36.4 ?C)-99 ?F (37.2 ?C)] 98 ?F (36.7 ?C)  Pulse:  [70-98] 70  Resp:  [16-20] 18  BP: (124-179)/(64-96) 179/81  SpO2:  [93 %-98 %] 95 %  Device (Oxygen Therapy): room air    I/O last 3 completed shifts:  In: -   Out: 1100 [Urine:1100]     CBC  Lab Results   Component Value Date    WBC 10.6 (H) 05/22/2020    HGB 11.2 (L) 05/22/2020    HCT 33.7 (L) 05/22/2020    PLT 191 05/22/2020     ELECTROLYTES  Lab Results   Component Value Date    NA 141 05/22/2020    K 4.2 05/22/2020    CL 106 05/22/2020    CO2 25 05/22/2020    CREATININE 1.42 (H) 05/22/2020    BUN 27 (H) 05/22/2020    GLU 131 (H) 05/22/2020    CALCIUM 8.4 (L) 05/22/2020    MG 2.3 05/12/2020     PT/INR/PTT  Lab Results   Component Value Date    INR 0.90 05/21/2020    PTT 23.1 05/21/2020     Microbiology:  Lab Results   Component Value Date    LABURIN No Growth 05/14/2020    LABURIN SEE NOTE 05/03/2020    LABURIN No Growth 04/25/2020       PHYSICAL EXAM  Patient exam or treatment required medical chaperone.  The sensitive parts of the examination were performed with chaperone present: Yes; Chaperone Name, Role/Title: Dr. Cleotis Lema     Gen: well appearing, resting in bed in no acute distress  CV: regular rate and rhythm  Pulm: clear to auscultation bilaterally   Abd: soft, rounded, non-tender to palpation  GU: Foley draining layered pink to red tinged urine    Assessment/Plan   Paul Hunter is a 69 y.o. male, Hospital Day: 2, with extensive PMH including CAD (on ASA/plavix now held since last admission), NSCLC, who has been experiencing ongoing hematuria with urinary retention due to clots who now returns with ongoing hematuria. I had a long conversation with patient regarding his hematuria. Although his catheter is now draining well and he does not appear to have clots, it does not guarantee that he will not clot his foley off again. Patient also would like more understanding as to the cause of his hematuria. If bladder ultrasound is not obtained promptly, will plan on proceeding with cystoscopy, clot evacuation, fulguration and possible bladder biopsy. His sister was involved in this discussion.      NPO/IVF  OR today for cystoscopy, clot evacuation possible fulguration, possible bladder biopsy  Maintain foley to gravity, irrigate PRN    Signed:  Chrystine Oiler, MD    Floor Pager (for primary patients): [phone]  Consult Pager: [phone]  from 6am-6pm.   Amion for nights/weekends.    Attending Addendum:

## 2020-05-22 NOTE — Utilization Review (ED)
UM Status: Commercial - IP

## 2020-05-22 NOTE — Progress Notes
[facility] Oncology Inpatient Progress Note 09/26/21Attending: Camila Li, MDHospital Day: 1PatientKadan Millstein Hunter  DOB: 04/27/1952Medical Record Number: [id number]  Admission Date: 05/21/2020 Subjective   Subjective: Chief Complaint: hematuriaInterval HistoryAdmitted overnight for recurrent hematuriaContinues to have blood tinged urine with some clots overnightNo cough, shortness of breath or feverWhen having bladder spasm, pain is severe, but normally no pain. Urology doing cystoscopy clot evacuationOutpatient Oncologist?	Name of OP oncologist: Layla Barter?	Discussed Pertinent Updates with OP oncologist?: NoReview of Systems: As aboveMedications / Allergies: Inpatient MedicationsCurrent Facility-Administered Medications Medication Dose Route Frequency Provider Last Rate Last Admin ? aspirin EC delayed release tablet 81 mg  81 mg Oral Daily Bui, Terrill Mohr, MD     ? escitalopram oxalate (LEXAPRO) tablet 10 mg  10 mg Oral Nightly Heidi Dach, MD   10 mg at 05/22/20 0100 ? [Held by provider] furosemide (LASIX) tablet 40 mg  40 mg Oral Daily Heidi Dach, MD     ? gabapentin (NEURONTIN) capsule 600 mg  600 mg Oral Nightly Heidi Dach, MD   600 mg at 05/22/20 0100 ? metoprolol succinate (TOPROL-XL) 24 hr tablet 12.5 mg  12.5 mg Oral Daily Bui, Terrill Mohr, MD     ? oxybutynin XL (DITROPAN-XL) 24 hr tablet 5 mg  5 mg Oral Daily Bui, Terrill Mohr, MD     ? predniSONE (DELTASONE) tablet 100 mg  100 mg Oral Daily Heidi Dach, MD     ? Melene Muller ON 05/26/2020] predniSONE (DELTASONE) tablet 90 mg  90 mg Oral Daily Heidi Dach, MD     ? rosuvastatin (CRESTOR) tablet 40 mg  40 mg Oral Daily Heidi Dach, MD   40 mg at 05/22/20 0100 ? sodium chloride 0.9 % flush 3 mL  3 mL IV Push Q8H Heidi Dach, MD   3 mL at 05/22/20 0509 ? HYDROmorphone  1 mg IV Push Once PRN ? sodium chloride  3 mL IV Push PRN for Line Care Allergies: Patient has no known allergies. Objective Objective: Vitals:Temp:  [97.5 ?F (36.4 ?C)-99 ?F (37.2 ?C)] 97.5 ?F (36.4 ?C)Pulse:  [70-98] 70Resp:  [16-20] 18BP: (124-178)/(64-96) 178/78SpO2:  [93 %-98 %] 94 %(!) 139 kg Pain Score (0-10 scale): 0ECOG: 1 = Strenuous physical activity restricted; fully ambulatory and able to carry out light workPhysical Exam:Body mass index is 41.56 kg/m?Marland KitchenGEN: not in distress, obeseHEENT: anictericCV: RRRPULM: scattered wheezingGU: foley with grapefruit urine in the bagABD: softEXT: no edemaSKIN: no rashNEURO: nonfocalIntake/Output Summary (Last 24 hours) at 05/22/2020 0708Last data filed at 05/22/2020 0611Gross per 24 hour Intake -- Output 1100 ml Net -1100 ml Laboratory / Imaging: Recent Labs Lab 09/24/210622 09/25/211835 09/26/210509 WBC 12.8* 11.5* 10.6* HGB 12.2 11.5* 11.2* HCT 36.5* 34.8* 33.7* PLT 215 200 191  Recent Labs Lab 09/24/210622 09/25/211835 09/26/210509 NEUTROPHILS 86.2* 92.3* 81.2  Recent Labs Lab 09/24/210622 09/25/211835 09/26/210509 NA 138 139 141 K 4.4 3.9 4.2 CL 102 102 106 CO2 25 24 25  BUN 30* 26* 27* CREATININE 1.46* 1.43* 1.42* GLU 128* 147* 131* ANIONGAP 11 13 10   Recent Labs Lab 09/24/210622 09/25/211835 09/26/210509 CALCIUM 8.4* 8.3* 8.4*  Recent Labs Lab 09/25/211835 ALT 44 AST 18 ALKPHOS 68 BILITOT 0.2 PROT 5.8* ALBUMIN 3.2*  Recent Labs Lab 09/25/211922 PTT 23.1 LABPROT 9.9 INR 0.90  Microbiology:Recent Results (from the past 168 hour(s)) Urine microscopic     ([facility])  Collection Time: 05/21/20  7:18 PM Result Value Ref Range  Epithelial Cells Few None-Few /LPF  Hyaline Casts,  UA 3 0 - 3 /LPF  Bacteria, UA None None-Few /HPF  WBC/HPF, UA 2 0 - 5 /HPF  RBC/HPF, UA 731 (H) 0 - 2 /HPF Urinalysis-macroscopic w/reflex microscopic  Collection Time: 05/21/20  7:18 PM Result Value Ref Range  Clarity, UA Cloudy (A) Clear  Color, UA Brown (A) Yellow  Specific Gravity, UA 1.012 1.005 - 1.030  pH, UA 6.5 5.5 - 7.5  Protein, UA 1+ (A) Negative-Trace  Glucose, UA Negative Negative  Ketones, UA Negative Negative  Blood, UA 3+ (A) Negative  Bilirubin, UA Negative Negative  Leukocytes, UA Negative Negative  Nitrite, UA Negative Negative  Urobilinogen, UA <2.0 <=2.0 EU/dL Urine microscopic     ([facility])  Collection Time: 05/19/20  1:22 PM Result Value Ref Range  Manual Microscopic Performed   Epithelial Cells None None-Few /LPF  Hyaline Casts, UA 0-3 0 - 3 /LPF  WBC/HPF 3-5 0 - 5 /HPF  RBC/HPF Many (A) 0 - 2 /HPF  Bacteria, UA None None-Few /HPF Urine microscopic     ([facility])  Collection Time: 05/16/20 11:48 AM Result Value Ref Range  Manual Microscopic Performed   Epithelial Cells Few None-Few /LPF  Hyaline Casts, UA 0-3 0 - 3 /LPF  WBC/HPF 6-10 (A) 0 - 5 /HPF  RBC/HPF Many (A) 0 - 2 /HPF  Bacteria, UA Few None-Few /HPF Imaging Last 24 Hours: No results found. Assessment Assessment: Paul Hunter is a 69 y.o. male with metastatic NSCLC (SCC) s/p carbo/taxol/pembro (last dose 03/10/20), CAD s/p PCI presenting with hematuria Plan Plan: # HematuriaEtiology unclear, Paul Hunter reports hematuria started after starting Plavix. Plavix was started after stress PET on 05/06/20 showing two areas of reversible perfusion defect. Cardio oncology plan seems to pursue LHC if he can tolerate plavix. 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# CAD s/p PCI 4 stents placed# stress PET with reversible defect on 9/10/21On prednisone taper, continue home dose 100mg , transition to 90mg  daily- If hematuria resolves, will place DAPT back on- holding lasix 40 as NPO currently for cystoscopy- CAD: metoprolol, statin, aspirin- ?ICI Myocarditis: prednisone 100 (patient states he decreases by 10mg  every Thursday, so next change is 90mg  on Thursday 9/30)# HTNSuspect pain component If continues to be high after lasxi resumed, can introduce ACEi# chronic medical problemPain: gabapentin 600 qhsMood: lexaproCurrent tobacco use: nicotine patchCKD: cr 1.4 at baseline. Etiology unclear suspect HTN inducedHospital Care:?	Nutrition: NPO?	Prophylaxis: SCD off chemoppx d/t hematuria?	Discharge Planning Needs: pending resolution of hematuria?	Code Status: fullI have personally discussed the plan of care with the patient. YesI have personally updated family regarding the patient?s admission. NoI have reviewed the most pertinent aspects of the current plan of care with the bedside nurse: yes Electronically Signed:Maan Zarcone Selena Batten, MD 05/22/2020 7:08 AM

## 2020-05-22 NOTE — Other
[facility]    [facility]     Urology Consult Note    Subjective:     Reason for Consultation: Urinary retention     HPI: Paul Hunter is a 69 y.o. male with a PMHx of NSCLC on chemotherapy, CAD (s/p PCI on ASA/Plavix), sleep apnea on CPAP, nephrolithiasis, and recurrent hematuria. Patient has had hematuria for the past 3 years followed by Dr. Meryl Dare, he [facility] with symptomatic urinary retention for which a foley catheter was placed he then re-presented with foley draining difficulty on 05/16/20 to the ED was discharged and on the same day underwent office cystoscopy with Dr. Meryl Dare who saw a large clot on the left bladder wall and had 16 fr foley placed, he then presented on the evening of 9/23 with urinary retention and was admitted briefly to urology where he had a 22 Fr hematuria catheter placed and his ASA/plavix was held. He was then discharged on 05/20/20. He returned today to the [facility] for urinary retention despite foley. Urology consulted for management.      Hospital course notable for having foley irrigated by [facility] team with return of 20 cc of very dark old appearing clots followed by spontaneous drainage of light pink urine from the foley. Given ongoing issue patient was admitted to med onc by the [facility] team.     On evaluation, patient was afebrile, HDS. Labs significant for Cr 1.43 (b/l 1.4-1.5), WBC 11.5, UA negative for bacteria, nitrites or electrolytes.     Patient states he is frustrated by his continued difficulty with urinary retention in the setting of clots. Denies dysuria or flank pain.   Medical History:     PMH PSH   Past Medical History:   Diagnosis Date   ? Anxiety    ? CAD (coronary artery disease)    ? Diabetes mellitus (HC Code)    ? GERD (gastroesophageal reflux disease)    ? Hyperlipidemia    ? Hypertension    ? Hypertrophy of prostate without urinary obstruction and other lower urinary tract symptoms (LUTS)    ? Other testicular hypofunction    ? Spinal stenosis 09/28/2018   ? Tobacco abuse    ? Vitamin D deficiency       Past Surgical History:   Procedure Laterality Date   ? CAROTID STENT  08/28/2003   ? CHOLECYSTECTOMY  11/07/2016   ? KNEE SURGERY      torn meniscus   ? LAMINECTOMY  10/07/2008   ? LUMBAR DISC SURGERY      herniated disc        Social History Family History   Social History     Tobacco Use   ? Smoking status: Current Every Day Smoker     Types: Cigarettes   Substance Use Topics   ? Alcohol use: Not on file      No family history on file.       Prior to Admission Medications   Medications Prior to Admission   Medication Sig Dispense Refill Last Dose   ? aspirin 81 MG EC tablet Take 81 mg by mouth daily.      ? atorvastatin (LIPITOR) 80 mg tablet Take 1 tablet (80 mg total) by mouth nightly. 30 tablet 6    ? escitalopram oxalate (LEXAPRO) 10 mg tablet Take 1 tablet (10 mg total) by mouth nightly. 90 tablet 3    ? furosemide (LASIX) 40 mg tablet  Take 1 tablet (40 mg total) by mouth daily. 30 tablet 2    ? gabapentin (NEURONTIN) 300 mg capsule Take 2 capsules (600 mg total) by mouth at bedtime. 60 capsule 0    ? metoprolol succinate XL (TOPROL-XL) 25 mg 24 hr tablet Take 0.5 tablets (12.5 mg total) by mouth daily. Take with or immediately following a meal. 30 tablet 0    ? oxybutynin XL (DITROPAN-XL) 5 mg 24 hr tablet Take 1 tablet (5 mg total) by mouth daily. 90 tablet 0    ? oxyCODONE (ROXICODONE) 5 mg Immediate Release tablet Take 1 tablet (5 mg total) by mouth every 4 (four) hours as needed for up to 5 doses. 5 tablet 0    ? predniSONE (DELTASONE) 10 mg tablet Take 13 tablets (130 mg total) by mouth daily for 7 days, THEN 12 tablets (120 mg total) daily for 7 days, THEN 11 tablets (110 mg total) daily for 7 days, THEN 10 tablets (100 mg total) daily for 7 days, THEN 9 tablets (90 mg total) daily for 7 days, THEN 8 tablets (80 mg total) daily for 7 days, THEN 7 tablets (70 mg total) daily for 7 days, THEN 6 tablets (60 mg total) daily for 7 days, THEN 5 tablets (50 mg total) daily for 7 days, THEN 4 tablets (40 mg total) daily for 7 days. Take with food.. 595 tablet 0    ? [START ON 06/28/2020] predniSONE (DELTASONE) 10 mg tablet Take 3 tablets (30 mg total) by mouth daily for 7 days, THEN 2 tablets (20 mg total) daily for 7 days, THEN 1 tablet (10 mg total) daily for 7 days, THEN 0.5 tablets (5 mg total) daily for 7 days, THEN 0.5 tablets (5 mg total) daily for 7 days. Take with food.. 49 tablet 0    ? carboxymethylcellulose (REFRESH PLUS) 0.5 % ophthalmic solution in dropperette Place 1 drop into both eyes 3 (three) times daily as needed.      ? fexofenadine (ALLEGRA) 180 mg tablet Take 180 mg by mouth daily as needed.       ? multivitamin capsule Take 1 capsule by mouth daily.              Allergies   No Known Allergies       Review of Systems:     A 12 point review of systems was conducted that was negative unless noted in the HPI    Objective:     Vitals:  Temp:  [98.8 ?F (37.1 ?C)] 98.8 ?F (37.1 ?C)  Pulse:  [89-98] 89  Resp:  [16-20] 16  BP: (124-173)/(70-96) 124/70  SpO2:  [93 %-98 %] 93 %  Device (Oxygen Therapy): room air    Ins/Outs:    Intake/Output Summary (Last 24 hours) at 05/21/2020 2335  Last data filed at 05/21/2020 2230  Gross per 24 hour   Intake ?   Output 700 ml   Net -700 ml       Physical Exam: Performed with RN Botti     Gen: NAD, AOx3  HEENT: NCAT  CVS: Regular rate  Pulm: Breathing comfortably on room air, bilateral chest rise  Abd: Soft, non-tender, non-distended, no guarding or rebound  GU: Foley irrigated with NS for return of no clots. Urine is light pink   Ext: WWP    Labs:   Recent Labs   Lab 05/19/20  1216 05/20/20  0622 05/21/20  1835   WBC 14.1* 12.8* 11.5*   HGB 11.8*  12.2 11.5*   HCT 34.9* 36.5* 34.8*   PLT 184 215 200    Recent Labs   Lab 05/19/20  1216 05/20/20  0622 05/21/20  1835   NEUTROPHILS 72.9 86.2* 92.3*      Recent Labs   Lab 05/19/20  1216 05/20/20  0622 05/21/20  1835 NA 139 138 139   K 3.8 4.4 3.9   CL 104 102 102   CO2 23 25 24    BUN 34* 30* 26*   CREATININE 1.50* 1.46* 1.43*   GLU 155* 128* 147*   ANIONGAP 12 11 13     Recent Labs   Lab 05/19/20  1216 05/20/20  0622 05/21/20  1835   CALCIUM 8.3* 8.4* 8.3*      Recent Labs   Lab 05/21/20  1835   ALT 44   AST 18   ALKPHOS 68   BILITOT 0.2    Recent Labs   Lab 05/21/20  1922   PTT 23.1   LABPROT 9.9   INR 0.90        Micro:  Lab Results   Component Value Date    LABURIN No Growth 05/14/2020        Diagnostics:  No results found.    Assessment:     69 yo male with extensive PMH including CAD (on ASA/plavix now held since last admission), NSCLC, who has been experiencing ongoing hematuria with urinary retention due to clots who now returns with ongoing hematuria. I had a long conversation with patient regarding his hematuria. Although his catheter is now draining well and he does not appear to have clots, it does not guarantee that he will not clot his foley off again. Patient also would like more understanding as to the cause of his hematuria. Therefore we will first plan to perform a bladder ultrasound to determine patients clot burden. If his urine remains light pink clear and the ultrasound does not reveal large clot burden we will attempt a bedside cystoscopy to assess for source of ongoing bleeding. We will also request medical clearance for patient to be taken to the OR tomorrow in case bedside cystoscopy reveals a bleeding source that must be intervened on or if ultrasound reveals significant clot burden. Patient was amenable to this plan    Plan:     - Medicine team to determine whether patient is medically fit for Operating room: cystoscopy, possible clot evacuation possible fulguration  - NPO, IVF at midnight   - COVID clearance   - Obtain bladder ultrasound   - booked for cystoscopy, clot evacuation, fulguration  - Discussed with Dr. Cyndie Chime resident and Dr. Malen Gauze attending     Signed:  Myrick Mcnairy D. Renee Pain MD, MBA  PGY-2 Consult pager: [phone]

## 2020-05-22 NOTE — Brief Op Note
[name] HealthPatient Name: Paul Hunter        [id number] Patient DOB: Nov 13, 1950     Surgery Date: 05/21/2020 - 9/26/2021Surgeon(s) and Role:   Malen Gauze, Jabier Mutton., MD - PrimaryAssistant(s):Resident: Doylene Canning, Normand Sloop, MD; Cleotis Lema, MDStaff:  Circulator: Reubin Milan, RNRadiology Technologist: Cathrine Muster Circulator: Arlyn Dunning, RNScrub Person: Santana-Correa, AnaPre-Op Diagnosis: Hematuria [R31.9] Procedure(s) and Anesthesia Type:   * CYSTOSCOPY, CLOT EVACUATION, , TURBT, left retrograde pyelogram, left ureteroscopy , left ureteral tumor bx - GENERALOperative Findings (enter relevant operative findings; do not refer to an operative report that is not yet transcribed): 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, left proximal ureteral tumorSigns of infection present at the time of surgery at the operative site: None Blood and Blood Products: none                 Drains:  22 Fr foleyImplants: * No implants in log * Specimens: ID Type Source Tests Collected by Time 1 : LEFT LATERAL BLADDER TUMOR AND TRIGONE- IN FORMALIN Tissue Bladder PATHOLOGY ([facility]) Catalina Antigua., MD 05/22/2020  2:55 PM 2 : left ureteral tumor Tissue Ureter, Left PATHOLOGY [facility]) Cleotis Lema, MD 05/22/2020  3:09 PM  Clinical Staging: N/AEBL: 15 ml       Post Operative Diagnosis: Bladder tumor, left ureteral tumor Cleotis Lema, MD9/26/20213:31 PM

## 2020-05-22 NOTE — Progress Notes
Re: Paul AstorinoMRN: [id number] DOB: [birth year]Date of service: 9/25/2021Referring Provider: Nikki Dom, MDOECC Provider: Dwaine Gale, PA-COncology [facility] Provider Progress NoteChief Complaint: [facility] with Male GU Problem.History of Present Illness/Oncology History:The patient is a 69 y.o. man with NSCLC currently tx'd with Carbo/Taxol/Pembro q21 days referred to [facility] for obstructed bladder catheter. Pt has been experiencing frequent episodes of catheter difficulties over past week necessitating multiple office and ED visits, culminating with admission. During last visit there was some discussion of possible OR for cystoscopy and clot evacuation but pt ultimately met criteria for d/c home.This AM he noted decreased UOP from catheter ~ noon with no drainage noted after 1PM. Suprapubic pressure and pain increased until he was found hunched over. Pt family declined EMS call citing lag time for arrival and desire not to be taken in to ED. Contacted oncologist to be seen in ECC.Pt arrived in significant pain and distress with no UOP. Pt taken to room and RN immediately began catheter irrigation. Many various sized blood clots and dark red urine expressed. Catheter ultimately began draining dark pink, clear urine.ECOG performance status at the time of the visit: 0Past Medical History:Past Medical History: Diagnosis Date ? Anxiety  ? CAD (coronary artery disease)  ? Diabetes mellitus (HC Code)  ? GERD (gastroesophageal reflux disease)  ? Hyperlipidemia  ? Hypertension  ? Hypertrophy of prostate without urinary obstruction and other lower urinary tract symptoms (LUTS)  ? Other testicular hypofunction  ? Spinal stenosis 09/28/2018 ? Tobacco abuse  ? Vitamin D deficiency  Past Surgical History:Past Surgical History: Procedure Laterality Date ? CAROTID STENT  08/28/2003 ? CHOLECYSTECTOMY  11/07/2016 ? KNEE SURGERY    torn meniscus ? LAMINECTOMY  10/07/2008 ? LUMBAR DISC SURGERY    herniated disc Allergies: Patient has no known allergies.Current Medications:Current Outpatient Medications Medication Sig ? aspirin Take 81 mg by mouth daily. ? atorvastatin Take 1 tablet (80 mg total) by mouth nightly. ? carboxymethylcellulose Place 1 drop into both eyes 3 (three) times daily as needed. ? escitalopram oxalate Take 1 tablet (10 mg total) by mouth nightly. ? fexofenadine Take 180 mg by mouth daily as needed.  ? furosemide Take 1 tablet (40 mg total) by mouth daily. ? gabapentin Take 2 capsules (600 mg total) by mouth at bedtime. ? metoprolol succinate XL Take 0.5 tablets (12.5 mg total) by mouth daily. Take with or immediately following a meal. ? multivitamin Take 1 capsule by mouth daily.  ? oxybutynin XL Take 1 tablet (5 mg total) by mouth daily. ? oxyCODONE Take 1 tablet (5 mg total) by mouth every 4 (four) hours as needed for up to 5 doses. ? polyethylene glycol Take by mouth. ? predniSONE Take 13 tablets (130 mg total) by mouth daily for 7 days, THEN 12 tablets (120 mg total) daily for 7 days, THEN 11 tablets (110 mg total) daily for 7 days, THEN 10 tablets (100 mg total) daily for 7 days, THEN 9 tablets (90 mg total) daily for 7 days, THEN 8 tablets (80 mg total) daily for 7 days, THEN 7 tablets (70 mg total) daily for 7 days, THEN 6 tablets (60 mg total) daily for 7 days, THEN 5 tablets (50 mg total) daily for 7 days, THEN 4 tablets (40 mg total) daily for 7 days. Take with food.. ? [START ON 06/28/2020] predniSONE Take 3 tablets (30 mg total) by mouth daily for 7 days, THEN 2 tablets (20 mg total) daily for 7 days, THEN 1  tablet (10 mg total) daily for 7 days, THEN 0.5 tablets (5 mg total) daily for 7 days, THEN 0.5 tablets (5 mg total) daily for 7 days. Take with food.. Current Facility-Administered Medications Medication ? morphine (ADULT) ROSPE:BP: (!) 160/78, Pulse: 65, Temp: 97.9 ?F (36.6 ?C), Temp src: Oral, Resp: 18, SpO2: 94 %, Height: 5' 10 (1.778 m), Weight: (!) 140.9 kg, Weight (lbs): 310.63Physical ExamConstitutional:     General: He is in acute distress.    Appearance: He is obese. HENT:    Head: Normocephalic and atraumatic. Eyes:    Conjunctiva/sclera: Conjunctivae normal. Cardiovascular:    Rate and Rhythm: Normal rate and regular rhythm. Pulmonary:    Effort: Pulmonary effort is normal.    Breath sounds: Normal breath sounds. Genitourinary:   Comments: Foley in placeSkin:   General: Skin is warm and dry. OECC Labs/Diagnostic Imaging/Procedures:Labs last 24 hours:No results found for this or any previous visit (from the past 24 hour(s)).Imaging impressions last 24 hours:No results found.MDM/Plan/OECC Course:Pt seen 5x over past week for same presentation. Pt would be irrigated until clot free and then d/c'd home each time. Pt was seen by urology and is currently clot free and draining pink clear  urine but pt and family member are very concerned to take pt home, given his necessary returns in past.Per urology, there is still consideration of clot evacuation. Will admit pt to oncology for further monitoring and urology involvement.2045: Urology plan is for cysto/clot evaluation in OR tomorrow. Pt should be made NPO after midnight tonight and will require medical clearance. Urology will discuss plan with admitting team.Consults called: Surgical subspecialty: urologyDischarge Diagnosis:Chemotherapy Complications: Hemorrhagic cystitisOECC Disposition:Admit-Inpatient floorElectronically Signed by Dwaine Gale, PA-C, May 21, 2020

## 2020-05-22 NOTE — Other
Operative Diagnosis:Pre-op:   Hematuria [R31.9] Patient Coded Diagnosis   Pre-op diagnosis: Hematuria  Post-op diagnosis: Hematuria  Patient Diagnosis   Pre-op diagnosis: Hematuria [R31.9]  Post-op diagnosis:     Post-op diagnosis:   * Hematuria [R31.9]Operative Procedure(s) :Procedure(s) (LRB):CYSTOSCOPY, CLOT EVACUATION, , TURBT, left retrograde pyelogram, left ureteroscopy , left ureteral tumor bx (N/A)Post-op Procedure & Diagnosis ConfirmationPost-op Diagnosis: Post-op Diagnosis confirmed (no changes)Post-op Procedure: Post-op Procedure confirmed (no changes)Anesthesia ClarifiersCysto/Renal:  Transurethral procedures including urethrocystoscopy, NOS and Transurethral resection bladder tumor(s)

## 2020-05-22 NOTE — Plan of Care
Problem: Adult Inpatient Plan of CareGoal: Plan of Care ReviewOutcome: Interventions implemented as appropriate Admission Note Nursing Paul Hunter is a 69 y.o. male admitted with a chief complaint of blood clots in urine. Patient arrived from  Patient is    Vitals:  05/21/20 1800 05/21/20 1837 05/21/20 2300 05/21/20 2355 BP: (!) 173/96 124/70  128/64 Pulse: (!) 98 89  74 Resp: 20 16  16  Temp:  98.8 ?F (37.1 ?C)  99 ?F (37.2 ?C) TempSrc:    Oral SpO2: 98% (!) 93%  94% Weight:   (!) 139 kg  Height:   6' (1.829 m)  Oxygen therapy Oxygen TherapySpO2: 94 %Device (Oxygen Therapy): room airI have reviewed the patient's current medication orders.Shary Key flowsheets, patient education and plan of care for additional information. Plan of Care Overview/ Patient Status    2250-0700Pt is AOx4, RA, VSS, pt states pain in groin area has dilaudid prn, skin intact, rt sided single lumen port, goes NPO at midnight, take pills whole, independent T/R, assist x1 OOB w/ cane, continence x2, foley in use, up to toilet, last BM 09/23, bed in lowest position, [facility]

## 2020-05-22 NOTE — Anesthesia Post-Procedure Evaluation
Anesthesia Post-op NotePatient: Elmon Else AstorinoProcedure(s):  Procedure(s) (LRB):CYSTOSCOPY, CLOT EVACUATION, , TURBT, left retrograde pyelogram, left ureteroscopy , left ureteral tumor bx (N/A) Patient location: PACULast Vitals:  I have noted the vital signs as listed in the nursing notes.Mental status recovered: patient participates in evaluation: YesVital signs reviewed: patient with post-operative hypertension. PRN labetalol ordered. Patient denies pain or discomfort. Mentating well without neurodeficit. Denies headache. Will continue to monitor.Respiratory function stable:YesAirway is patent: YesCardiovascular function and hydration status stable: YesPain control satisfactory: YesNausea and vomiting control satisfactory:Yes

## 2020-05-22 NOTE — Progress Notes
0700-1900Pt A&Ox4, SB OOB w/ cane, bed alarm on. RSL port patent, c/d/i, +BBR, capped. VSS, O2 WNL on RA, afebrile. Regular diet. Pt off floor to OR for cystoscopy, clot evacuation, and biopsy. Returned to floor safely, weaned back to RA, satting high 90%s. Foley putting out red urine prior to procedure, monitoring for color changes post-procedure, pt endorsing some penile pain but reports that it is improved upon returning to floor. LBM 9/23. Safety maintained, WCTM.

## 2020-05-22 NOTE — Other
PACU to Floor Nursing Transfer NotePreop Diagnosis: Hematuria [R31.9]Procedure Done: * No post-op diagnosis entered *Any Significant Events Intra-Op: noneAbnormal Assessment in PACU: noneLevel of Consciousness: awake and responds to stimulationLast Set of VS:  Vitals:  05/22/20 1556 05/22/20 1600 05/22/20 1615 05/22/20 1630 BP: (!) 206/115 (!) 205/104 (!) 201/109 (!) 173/96 Pulse: 80 82 77 69 Resp: 15 (!) 10 (!) 12 15 Temp: 97.2 ?F (36.2 ?C)    TempSrc: Temporal    SpO2: 99% 100% 99% 97% Weight:     Height:     Device (Oxygen Therapy): nasal cannula (nasal trumpet removed) O2 Flow (L/min): 3Baseline Neuro/developmental Status: WDLLabs Collected: NoSpecial Needs of the Patient: noneAntibiotics: last dose given in OR: see MARIV Access: Periph IV-single(adult) 05/22/20 1446 metacarpal(dorsum of hand), right over-the-needle catheter system 18 gauge Anesthesia Resident (Active) IV Fluids: Pain Assessment: Number Scale (0-10) 05/21/20 2300 generalized groin-Pain Rating (0-10): Rest: 5Pain Assessment: Number Scale (0-10) 05/21/20 2300 generalized groin-Pain Rating (0-10): Activity: 1 Pain Assessment: Number Scale (0-10) 05/21/20 2300 generalized groin-Pharmaceutical Interventions: single medication modality    Body Position: supineHead of Bed (HOB) Positioning: HOB at 20-30 degrees   Time of Last Void or Time that Urinary Catheter was Removed Intra-Op: catheter in place Additional Info: Contact RN (name and phone number): Jassiel Flye RN [phone]

## 2020-05-22 NOTE — H&P
Internal Medicine H&P    Attending Provider: Camila Li, MD  Patient seen and examined on 05/21/2020 9:18 PM  Primary hematologist/oncologist: Dr. Layla Barter    History of Present Illness:     Paul Hunter is a 69 y.o. male with a history of NSCLC (on carbo/taxol/pembro C5D1 03/24/20), CAD s/p PCI with 3 stents placed in 2014 (on ASA; plavix on hold d/t hematuria with clots), concern for worsening cardiomyopathy on recent PET stress test (ischemic vs ICI-induced), OSA on CPAP, and hematuria with known bladder blood clot who presents with urinary retention and pain.    Paul Hunter has had several healthcare encounters for urinary retention, hematuria, and newly discovered blood clot in the bladder. He was most recently admitted on 9/23 with penile pain, then admitted to the urology service with initial thought of cystoscopy and clot evacuation. His urine color ultimately cleared, so procedure was deferred. He underwent bedside irrigation, and he was discharged home on 9/24.    He also had a recent hospitalization 9/13-16 for heart failure exacerbation, for which he underwent PET stress with c/f multivessel CAD vs immune checkpoint inhibitor myocarditis. He was started on plavix at this time with plan for cardiac MRI (scheduled for late October) and cardio-oncology follow-up on 9/24, which he missed due to hospitalization.    Paul Hunter reports after his discharge yesterday, he was well for about two hours before he began sensing urinary retention and penile pain. His urine became salmon-colored. The next morning, his pain persisted, with very little volume coming from his foley catheter. His pain became 10/10, and for that reason he presents again to [facility] for urinary retention and significant pain.     ECC findings:  T 98.8 BP 124/70 HR 89 RR 16 SpO2 93% on RA  CBC with downtrending WBC with L-shift  CMP stable  UA with 1+ protein, 3+ blood, 731 RBCs    ECC interventions:  Foley catheter was flushed with saline, resulting in evacuation of several clots and marked improvement in symptoms  Urology was consulted with plan for cysto/clot evacuation in OR tomorrow  Ceftriaxone x1  Dilaudid 1mg  x1    Review of Systems:     Review of Systems   Constitutional: Negative for chills, fever and malaise/fatigue.   HENT: Negative for ear discharge and ear pain.    Eyes: Negative for pain and discharge.   Respiratory: Negative for cough, sputum production and shortness of breath.    Cardiovascular: Positive for leg swelling. Negative for chest pain, orthopnea and claudication.   Gastrointestinal: Positive for constipation. Negative for abdominal pain, nausea and vomiting.   Genitourinary: Positive for dysuria and hematuria.        Urinary retention   Musculoskeletal: Negative for myalgias and neck pain.   Skin: Negative for itching and rash.   Neurological: Negative for sensory change and focal weakness.   Psychiatric/Behavioral: Negative for depression and suicidal ideas.     All other ROS was assessed and is otherwise negative.    Medical History:     PMH PSH   Past Medical History:   Diagnosis Date   ? Anxiety    ? CAD (coronary artery disease)    ? Diabetes mellitus (HC Code)    ? GERD (gastroesophageal reflux disease)    ? Hyperlipidemia    ? Hypertension    ? Hypertrophy of prostate without urinary obstruction and other lower urinary tract symptoms (LUTS)    ? Other testicular hypofunction    ?  Spinal stenosis 09/28/2018   ? Tobacco abuse    ? Vitamin D deficiency     Past Surgical History:   Procedure Laterality Date   ? CAROTID STENT  08/28/2003   ? CHOLECYSTECTOMY  11/07/2016   ? KNEE SURGERY      torn meniscus   ? LAMINECTOMY  10/07/2008   ? LUMBAR DISC SURGERY      herniated disc      Social History Family History   Social History     Tobacco Use   ? Smoking status: Current Every Day Smoker     Types: Cigarettes   Substance Use Topics   ? Alcohol use: Not on file    No family history on file.     Prior to Admission Medications   No current facility-administered medications on file prior to encounter.     Current Outpatient Medications on File Prior to Encounter   Medication Sig Dispense Refill   ? aspirin 81 MG EC tablet Take 81 mg by mouth daily.     ? atorvastatin (LIPITOR) 80 mg tablet Take 1 tablet (80 mg total) by mouth nightly. 30 tablet 6   ? carboxymethylcellulose (REFRESH PLUS) 0.5 % ophthalmic solution in dropperette Place 1 drop into both eyes 3 (three) times daily as needed.     ? escitalopram oxalate (LEXAPRO) 10 mg tablet Take 1 tablet (10 mg total) by mouth nightly. 90 tablet 3   ? fexofenadine (ALLEGRA) 180 mg tablet Take 180 mg by mouth daily as needed.      ? furosemide (LASIX) 40 mg tablet Take 1 tablet (40 mg total) by mouth daily. 30 tablet 2   ? gabapentin (NEURONTIN) 300 mg capsule Take 2 capsules (600 mg total) by mouth at bedtime. 60 capsule 0   ? metoprolol succinate XL (TOPROL-XL) 25 mg 24 hr tablet Take 0.5 tablets (12.5 mg total) by mouth daily. Take with or immediately following a meal. 30 tablet 0   ? multivitamin capsule Take 1 capsule by mouth daily.      ? oxybutynin XL (DITROPAN-XL) 5 mg 24 hr tablet Take 1 tablet (5 mg total) by mouth daily. 90 tablet 0   ? oxyCODONE (ROXICODONE) 5 mg Immediate Release tablet Take 1 tablet (5 mg total) by mouth every 4 (four) hours as needed for up to 5 doses. 5 tablet 0   ? polyethylene glycol (GLYCOLAX; MIRALAX) 17 gram/dose powder Take by mouth.     ? predniSONE (DELTASONE) 10 mg tablet Take 13 tablets (130 mg total) by mouth daily for 7 days, THEN 12 tablets (120 mg total) daily for 7 days, THEN 11 tablets (110 mg total) daily for 7 days, THEN 10 tablets (100 mg total) daily for 7 days, THEN 9 tablets (90 mg total) daily for 7 days, THEN 8 tablets (80 mg total) daily for 7 days, THEN 7 tablets (70 mg total) daily for 7 days, THEN 6 tablets (60 mg total) daily for 7 days, THEN 5 tablets (50 mg total) daily for 7 days, THEN 4 tablets (40 mg total) daily for 7 days. Take with food.. 595 tablet 0   ? [START ON 06/28/2020] predniSONE (DELTASONE) 10 mg tablet Take 3 tablets (30 mg total) by mouth daily for 7 days, THEN 2 tablets (20 mg total) daily for 7 days, THEN 1 tablet (10 mg total) daily for 7 days, THEN 0.5 tablets (5 mg total) daily for 7 days, THEN 0.5 tablets (5 mg total) daily for 7 days.  Take with food.. 49 tablet 0   ? [DISCONTINUED] albuterol sulfate 90 mcg/actuation HFA aerosol inhaler           Allergies   No Known Allergies     Objective:     Vitals:  Patient Vitals for the past 24 hrs:   BP Temp Pulse Resp SpO2 Height Weight   05/21/20 2300 ? ? ? ? ? 6' (1.829 m) (!) 139 kg   05/21/20 1837 124/70 98.8 ?F (37.1 ?C) 89 16 (!) 93 % ? ?   05/21/20 1800 (!) 173/96 ? (!) 98 20 98 % ? ?     Physical Exam:     Constitutional: Well-appearing, lying in bed in no acute distress  Eyes: No scleral icterus. PERRL  ENMT: Moist oropharynx, hearing grossly normal  Respiratory: No increased work of breathing. CTAB, no wheezes, rhonchi, or rales  Cardiovascular: RRR, no murmurs, rubs, or gallops. 2+ BLLE edema  GI: +BS, nontender, mildly distended, large habitus. No obvious hepatosplenomegaly.  Musculoskeletal: Lower extremities well, warm, and perfused, nontender. No clubbing or cyanosis.  Skin: No rashes noted. Palpation of the skin without findings of induration or nodules.  Neuro: Cranial nerves and overall sensation grossly intact.  Psych: Judgment and insight grossly intact. Mood and affect normal.    Data:     Labs:  Recent Labs   Lab 05/19/20  1216 05/20/20  0622 05/21/20  1835   WBC 14.1* 12.8* 11.5*   HGB 11.8* 12.2 11.5*   HCT 34.9* 36.5* 34.8*   PLT 184 215 200   NEUTROPHILS 72.9 86.2* 92.3*   ANCANC 10.3 11.0 10.7   LYMPHABS 2.8 1.2 0.6*    Recent Labs   Lab 05/19/20  1216 05/20/20  0622 05/21/20  1835   NA 139 138 139   K 3.8 4.4 3.9   CL 104 102 102   CO2 23 25 24    BUN 34* 30* 26*   CREATININE 1.50* 1.46* 1.43*   ANIONGAP 12 11 13    CALCIUM 8.3* 8.4* 8.3* Recent Labs   Lab 05/21/20  1835   AST 18   ALT 44   ALKPHOS 68   BILITOT 0.2   PROT 5.8*   ALBUMIN 3.2*    Recent Labs   Lab 05/21/20  1922   PTT 23.1   LABPROT 9.9   INR 0.90        Microbiology:    9/25 COVID: negative  9/25 UCx: pending    Imaging:    9/24 Renal US:  1.  Mild left-sided hydronephrosis and left renal atrophy, stable.  2.  Echogenic material/debris within the bladder may represent clot versus debris from an infectious etiology. Correlation with urinalysis is recommended.    ECG/Tele Events:     No new EKG    Assessment:     Paul Hunter is a 69 y.o. male with a history of NSCLC (on carbo/taxol/pembro C5D1 03/24/20), CAD s/p PCI with 3 stents placed in 2014 (on ASA; plavix on hold d/t hematuria with clots), concern for worsening cardiomyopathy on recent PET stress test (ischemic vs ICI-induced), OSA on CPAP, and hematuria with known bladder blood clot who presents with urinary retention and pain, now resolved after flushing his foley catheter in ECC.    Although his symptoms have improved, recurrence of his presentation suggests that cystoscopy with clot evacuation may be helpful. Underlying his preoperative clearance is recently found concern for CAD progression, either ischemic or from ICI-induced myocarditis.  Plan:     Hematuria and pain - due to presence of bladder clot, although I'm not certain why he has the clot in the first place outside of hypercoagulability of malignancy. Improved s/p flushing of foley catheter.  -If recurs, will attempt to flush catheter and provide IV dilaudid for symptoms  -If refractory, will call urology for consideration of bedside bladder irrigation  -Appreciate urology involvement; due to consideration of OR tomorrow with clot evacuation, will keep NPO at midnight  -Pelvis US ordered at request of urology to reeval for bladder clot    Internal Medicine Procedural Clearance:  RCRI 2 (for CAD, HFpEF), leading to Class III risk, 10.1% 30-day risk of death, MI, or cardiac arrest.    He has recent concern for worsening CAD (either ischemic or as result of pembrolizumab), with ongoing workup with cardio-oncology. From Treatment Plan note 9/24 and my discussion with overnight cardiology fellow tonight, the cardiology team would prefer urologic intervention to stem his hematuria PRIOR to revascularization in order for them to safely initiate dual-antiplatelet and lower risk of in-stent thrombosis. His LHC will likely be performed once stable as an OUTPATIENT, and does not need to be performed prior to urologic surgery.    Otherwise, the patient is medically optimized for procedure. Last A1c essentially normal. I will order preop EKG for the AM just as a baseline, although he also has several EKGs from a few weeks ago.    Chronic:  HFpEF: lasix 40 daily (held perioperatively due to possible surgery this AM, would restart after procedure)  CAD: metoprolol, statin, aspirin  Bladder spasms: oxybutynin  ?ICI Myocarditis: prednisone 100 (patient states he decreases by 10mg  every Thursday, so next change is 90mg  on Thursday 9/30)  Pain: gabapentin 600 qhs  Mood: lexapro    Medication reconciliation was completed on admission.    PPx: SCDs  FEN/GI: NPO  Mobility: Ambulate as tolerated  Disposition: Medical oncology  Code: Full   Primary Emergency Contact: AUSTIN,[name], Home Phone: [phone]    ---  Heidi Dach   Internal Medicine Attending  Please contact [facility] with questions

## 2020-05-23 ENCOUNTER — Inpatient Hospital Stay
Admit: 2020-05-23 | Payer: PRIVATE HEALTH INSURANCE | Attending: Student in an Organized Health Care Education/Training Program

## 2020-05-23 ENCOUNTER — Inpatient Hospital Stay: Admit: 2020-05-23 | Payer: PRIVATE HEALTH INSURANCE | Attending: Emergency

## 2020-05-23 ENCOUNTER — Encounter: Admit: 2020-05-23 | Payer: PRIVATE HEALTH INSURANCE | Attending: Medical

## 2020-05-23 ENCOUNTER — Encounter: Admit: 2020-05-23 | Payer: PRIVATE HEALTH INSURANCE

## 2020-05-23 ENCOUNTER — Ambulatory Visit: Admit: 2020-05-23 | Payer: PRIVATE HEALTH INSURANCE | Attending: Hematology

## 2020-05-23 ENCOUNTER — Inpatient Hospital Stay: Admit: 2020-05-23 | Payer: PRIVATE HEALTH INSURANCE

## 2020-05-23 DIAGNOSIS — E785 Hyperlipidemia, unspecified: Secondary | ICD-10-CM

## 2020-05-23 DIAGNOSIS — E559 Vitamin D deficiency, unspecified: Secondary | ICD-10-CM

## 2020-05-23 DIAGNOSIS — N400 Enlarged prostate without lower urinary tract symptoms: Secondary | ICD-10-CM

## 2020-05-23 DIAGNOSIS — M4800 Spinal stenosis, site unspecified: Secondary | ICD-10-CM

## 2020-05-23 DIAGNOSIS — Z720 Tobacco use: Secondary | ICD-10-CM

## 2020-05-23 DIAGNOSIS — E291 Testicular hypofunction: Secondary | ICD-10-CM

## 2020-05-23 DIAGNOSIS — T884XXA Failed or difficult intubation, initial encounter: Secondary | ICD-10-CM

## 2020-05-23 DIAGNOSIS — I2510 Atherosclerotic heart disease of native coronary artery without angina pectoris: Secondary | ICD-10-CM

## 2020-05-23 DIAGNOSIS — F419 Anxiety disorder, unspecified: Secondary | ICD-10-CM

## 2020-05-23 DIAGNOSIS — K219 Gastro-esophageal reflux disease without esophagitis: Secondary | ICD-10-CM

## 2020-05-23 DIAGNOSIS — N3091 Cystitis, unspecified with hematuria: Secondary | ICD-10-CM

## 2020-05-23 DIAGNOSIS — E119 Type 2 diabetes mellitus without complications: Secondary | ICD-10-CM

## 2020-05-23 DIAGNOSIS — D494 Neoplasm of unspecified behavior of bladder: Secondary | ICD-10-CM

## 2020-05-23 DIAGNOSIS — I10 Essential (primary) hypertension: Secondary | ICD-10-CM

## 2020-05-23 LAB — COMPREHENSIVE METABOLIC PANEL
BKR A/G RATIO: 1.3 (ref 1.0–2.2)
BKR ALANINE AMINOTRANSFERASE (ALT): 34 U/L (ref 9–59)
BKR ALBUMIN: 2.9 g/dL — ABNORMAL LOW (ref 3.6–4.9)
BKR ALKALINE PHOSPHATASE: 66 U/L (ref 9–122)
BKR ANION GAP: 8 (ref 7–17)
BKR ASPARTATE AMINOTRANSFERASE (AST): 16 U/L (ref 10–35)
BKR AST/ALT RATIO: 0.5
BKR BILIRUBIN TOTAL: 0.2 mg/dL (ref <=1.2)
BKR BLOOD UREA NITROGEN: 25 mg/dL — ABNORMAL HIGH (ref 8–23)
BKR BUN / CREAT RATIO: 18.2 (ref 8.0–23.0)
BKR CALCIUM: 8.1 mg/dL — ABNORMAL LOW (ref 8.8–10.2)
BKR CHLORIDE: 105 mmol/L (ref 98–107)
BKR CO2: 26 mmol/L — ABNORMAL HIGH (ref 20–30)
BKR CREATININE: 1.37 mg/dL — ABNORMAL HIGH (ref 0.40–1.30)
BKR EGFR (AFR AMER): >60 mL/min/1.73m2 (ref >60)
BKR EGFR (NON AFRICAN AMERICAN): 52 mL/min/1.73m2 — ABNORMAL HIGH (ref >60)
BKR GLOBULIN: 2.3 g/dL (ref 2.3–3.5)
BKR GLUCOSE: 132 mg/dL — ABNORMAL HIGH (ref 70–100)
BKR POTASSIUM: 4.3 mmol/L — ABNORMAL HIGH (ref 3.3–5.1)
BKR PROTEIN TOTAL: 5.2 g/dL — ABNORMAL LOW (ref 6.6–8.7)
BKR SODIUM: 139 mmol/L — ABNORMAL LOW (ref 136–144)
BKR WAM NUCLEATED RED BLOOD CELLS: 0.5 % (ref 0.0–1.0)

## 2020-05-23 LAB — CBC WITH AUTO DIFFERENTIAL
BKR WAM ABSOLUTE IMMATURE GRANULOCYTES: 0.1 x 1000/??L (ref 0.0–0.3)
BKR WAM ABSOLUTE LYMPHOCYTE COUNT: 1.7 x 1000/??L (ref 1.0–4.0)
BKR WAM ABSOLUTE NRBC: 0 x 1000/??L (ref 0.0–0.0)
BKR WAM ANALYZER ANC: 10 x 1000/??L — ABNORMAL HIGH (ref 1.0–11.0)
BKR WAM BASOPHIL ABSOLUTE COUNT: 0 x 1000/ÂµL (ref 0.0–0.0)
BKR WAM BASOPHILS: 0.1 % (ref 0.0–4.0)
BKR WAM EOSINOPHIL ABSOLUTE COUNT: 0 x 1000/??L (ref 0.0–1.0)
BKR WAM EOSINOPHILS: 0.3 % (ref 0.0–7.0)
BKR WAM HEMATOCRIT: 31.7 % — ABNORMAL LOW (ref 37.0–52.0)
BKR WAM HEMOGLOBIN: 10.6 g/dL — ABNORMAL LOW (ref 12.0–18.0)
BKR WAM IMMATURE GRANULOCYTES: 0.7 % (ref 0.0–3.0)
BKR WAM LYMPHOCYTES: 13.5 % (ref 8.0–49.0)
BKR WAM MCH (PG): 32.1 pg — ABNORMAL HIGH (ref 27.0–31.0)
BKR WAM MCHC: 33.4 g/dL (ref 31.0–36.0)
BKR WAM MCV: 96.1 fL — ABNORMAL HIGH (ref 78.0–94.0)
BKR WAM MONOCYTE ABSOLUTE COUNT: 0.5 x 1000/??L — ABNORMAL LOW (ref 0.0–2.0)
BKR WAM MONOCYTES: 4.4 % (ref 4.0–15.0)
BKR WAM MPV: 9.5 fL (ref 6.0–11.0)
BKR WAM NEUTROPHILS: 81 % (ref 37.0–84.0)
BKR WAM PLATELETS: 207 x1000/??L (ref 140–440)
BKR WAM RDW-CV: 15.2 % — ABNORMAL HIGH (ref 11.5–14.5)
BKR WAM RED BLOOD CELL COUNT: 3.3 M/??L — ABNORMAL LOW (ref 3.8–5.9)
BKR WAM WHITE BLOOD CELL COUNT: 12.3 x1000/??L — ABNORMAL HIGH (ref 4.0–10.0)

## 2020-05-23 LAB — HEMOGLOBIN AND HEMATOCRIT, BLOOD
BKR WAM HEMATOCRIT: 31.1 % — ABNORMAL LOW (ref 37.0–52.0)
BKR WAM HEMOGLOBIN: 10.2 g/dL — ABNORMAL LOW (ref 12.0–18.0)

## 2020-05-23 MED ORDER — DEXAMETHASONE SODIUM PHOSPHATE 4 MG/ML INJECTION SOLUTION
4 mg/mL | INTRAVENOUS | Status: DC | PRN
Start: 2020-05-23 — End: 2020-05-23
  Administered 2020-05-23: 11:00:00 4 mg/mL via INTRAVENOUS

## 2020-05-23 MED ORDER — LIDOCAINE 2 % MUCOSAL JELLY IN APPLICATOR
2 % | Freq: Two times a day (BID) | URETHRAL | Status: DC | PRN
Start: 2020-05-23 — End: 2020-05-26
  Administered 2020-05-24: 19:00:00 2 mL via URETHRAL

## 2020-05-23 MED ORDER — ASPIRIN 81 MG TABLET,DELAYED RELEASE
81 mg | Freq: Every day | ORAL | Status: DC
Start: 2020-05-23 — End: 2020-05-26
  Administered 2020-05-25: 13:00:00 81 mg via ORAL

## 2020-05-23 MED ORDER — HYDROMORPHONE 0.5 MG/0.5 ML INJECTION SYRINGE
0.5 mg/ mL | Status: CP
Start: 2020-05-23 — End: ?

## 2020-05-23 MED ORDER — CALCIUM CARBONATE 200 MG CALCIUM (500 MG) CHEWABLE TABLET
500 mg (200 mg calcium) | Freq: Three times a day (TID) | ORAL | Status: DC | PRN
Start: 2020-05-23 — End: 2020-05-26
  Administered 2020-05-23 – 2020-05-24 (×2): 500 mg (200 mg calcium) via ORAL

## 2020-05-23 MED ORDER — FENTANYL (PF) 50 MCG/ML INJECTION SOLUTION
50 mcg/mL | Status: CP
Start: 2020-05-23 — End: ?

## 2020-05-23 MED ORDER — FENTANYL (PF) 50 MCG/ML INJECTION SOLUTION
50 mcg/mL | INTRAVENOUS | Status: DC | PRN
Start: 2020-05-23 — End: 2020-05-23
  Administered 2020-05-23 (×2): 50 mcg/mL via INTRAVENOUS

## 2020-05-23 MED ORDER — ESMOLOL 100 MG/10 ML (10 MG/ML) INTRAVENOUS SOLUTION
100 mg/10 mL (10 mg/mL) | INTRAVENOUS | Status: DC | PRN
Start: 2020-05-23 — End: 2020-05-23
  Administered 2020-05-23 (×2): 100 mg/10 mL (10 mg/mL) via INTRAVENOUS

## 2020-05-23 MED ORDER — ROCURONIUM 10 MG/ML INTRAVENOUS SOLUTION
10 mg/mL | INTRAVENOUS | Status: DC | PRN
Start: 2020-05-23 — End: 2020-05-23
  Administered 2020-05-23 (×2): 10 mg/mL via INTRAVENOUS

## 2020-05-23 MED ORDER — FENTANYL (PF) 50 MCG/ML INJECTION SOLUTION
50 mcg/mL | INTRAVENOUS | Status: DC | PRN
Start: 2020-05-23 — End: 2020-05-23

## 2020-05-23 MED ORDER — BELLADONNA ALKALOIDS-OPIUM 16.2 MG-60 MG RECTAL SUPPOSITORY
Status: CP
Start: 2020-05-23 — End: ?

## 2020-05-23 MED ORDER — SUCCINYLCHOLINE CHLORIDE 20 MG/ML INJECTION SOLUTION
20 mg/mL | INTRAVENOUS | Status: DC | PRN
Start: 2020-05-23 — End: 2020-05-23
  Administered 2020-05-23: 11:00:00 20 mg/mL via INTRAVENOUS

## 2020-05-23 MED ORDER — SORBITOL 3 % IRRIGATION SOLUTION
3 % | Status: DC | PRN
Start: 2020-05-23 — End: 2020-05-23
  Administered 2020-05-23 (×4): 3 %

## 2020-05-23 MED ORDER — LACTATED RINGERS INTRAVENOUS SOLUTION
INTRAVENOUS | Status: DC | PRN
Start: 2020-05-23 — End: 2020-05-23
  Administered 2020-05-23: 10:00:00 via INTRAVENOUS

## 2020-05-23 MED ORDER — HYDROMORPHONE 0.5 MG/0.5 ML INJECTION SYRINGE
0.5 mg/ mL | INTRAVENOUS | Status: DC | PRN
Start: 2020-05-23 — End: 2020-05-23
  Administered 2020-05-23: 13:00:00 0.5 mL via INTRAVENOUS

## 2020-05-23 MED ORDER — NICOTINE 7 MG/24 HR DAILY TRANSDERMAL PATCH
7 mg | Freq: Every day | TRANSDERMAL | Status: DC
Start: 2020-05-23 — End: 2020-05-25

## 2020-05-23 MED ORDER — BELLADONNA ALKALOIDS-OPIUM 16.2 MG-30 MG RECTAL SUPPOSITORY
Status: CP
Start: 2020-05-23 — End: ?

## 2020-05-23 MED ORDER — POLYETHYLENE GLYCOL 3350 17 GRAM ORAL POWDER PACKET
17 gram | Freq: Every day | ORAL | Status: DC | PRN
Start: 2020-05-23 — End: 2020-05-26
  Administered 2020-05-23: 17:00:00 17 gram via ORAL

## 2020-05-23 MED ORDER — BISACODYL 10 MG RECTAL SUPPOSITORY
10 mg | Freq: Every day | RECTAL | Status: DC | PRN
Start: 2020-05-23 — End: 2020-05-26

## 2020-05-23 MED ORDER — HYDROMORPHONE 0.5 MG/0.5 ML INJECTION SYRINGE
0.5 mg/ mL | INTRAVENOUS | Status: DC | PRN
Start: 2020-05-23 — End: 2020-05-23

## 2020-05-23 MED ORDER — FAMOTIDINE 4 MG/ML IN STERILE WATER (ADULT)
INTRAVENOUS | Status: DC
Start: 2020-05-23 — End: 2020-05-24
  Administered 2020-05-23 – 2020-05-24 (×2): 5.000 mL via INTRAVENOUS

## 2020-05-23 MED ORDER — EPHEDRINE (PF) 25 MG/5 ML (5 MG/ML) IN 0.9% SODIUM CHLORIDE IV SYRINGE
25 mg/5 mL (5 mg/mL) | INTRAVENOUS | Status: DC | PRN
Start: 2020-05-23 — End: 2020-05-23
  Administered 2020-05-23 (×3): 25 mg/5 mL (5 mg/mL) via INTRAVENOUS

## 2020-05-23 MED ORDER — ONDANSETRON HCL (PF) 4 MG/2 ML INJECTION SOLUTION
4 mg/2 mL | INTRAVENOUS | Status: DC | PRN
Start: 2020-05-23 — End: 2020-05-23

## 2020-05-23 MED ORDER — FENTANYL (PF) 50 MCG/ML INJECTION SOLUTION
50 mcg/mL | INTRAVENOUS | Status: DC | PRN
Start: 2020-05-23 — End: 2020-05-23
  Administered 2020-05-23: 12:00:00 50 mL via INTRAVENOUS

## 2020-05-23 MED ORDER — SUGAMMADEX 100 MG/ML INTRAVENOUS SOLUTION
100 mg/mL | INTRAVENOUS | Status: DC | PRN
Start: 2020-05-23 — End: 2020-05-23
  Administered 2020-05-23: 12:00:00 100 mg/mL via INTRAVENOUS

## 2020-05-23 MED ORDER — CEFTRIAXONE IV PUSH 1000 MG VIAL & STERILE WATER (ADULTS)
Freq: Once | INTRAVENOUS | Status: CP
Start: 2020-05-23 — End: ?
  Administered 2020-05-23: 07:00:00 10.000 mL via INTRAVENOUS

## 2020-05-23 MED ORDER — PROPOFOL 10 MG/ML INTRAVENOUS EMULSION
10 mg/mL | INTRAVENOUS | Status: DC | PRN
Start: 2020-05-23 — End: 2020-05-23
  Administered 2020-05-23: 11:00:00 10 mg/mL via INTRAVENOUS

## 2020-05-23 MED ORDER — BELLADONNA ALKALOIDS-OPIUM 16.2 MG-30 MG RECTAL SUPPOSITORY
Freq: Once | RECTAL | Status: CP
Start: 2020-05-23 — End: ?
  Administered 2020-05-23: 13:00:00 via RECTAL

## 2020-05-23 MED ORDER — LIDOCAINE HCL 2 % MUCOSAL SOLUTION
2 % | Status: DC
Start: 2020-05-23 — End: 2020-05-24

## 2020-05-23 MED ORDER — BELLADONNA ALKALOIDS-OPIUM 16.2 MG-30 MG RECTAL SUPPOSITORY
Freq: Two times a day (BID) | RECTAL | Status: DC | PRN
Start: 2020-05-23 — End: 2020-05-23

## 2020-05-23 MED ORDER — PHENYLEPHRINE 1 MG/10 ML (100 MCG/ML) IN 0.9 % SOD.CHLORIDE IV SYRINGE
1 mg/0 mL (00 mcg/mL) | INTRAVENOUS | Status: DC | PRN
Start: 2020-05-23 — End: 2020-05-23
  Administered 2020-05-23 (×3): 1 mg/0 mL (00 mcg/mL) via INTRAVENOUS

## 2020-05-23 MED ORDER — DIMENHYDRINATE 5 MG/ML IN 0.9% SODIUM CHLORIDE
INTRAVENOUS | Status: DC | PRN
Start: 2020-05-23 — End: 2020-05-23

## 2020-05-23 MED ORDER — SULFAMETHOXAZOLE 800 MG-TRIMETHOPRIM 160 MG TABLET
800-160 mg | ORAL | Status: DC
Start: 2020-05-23 — End: 2020-05-26
  Administered 2020-05-25: 13:00:00 800-160 mg via ORAL

## 2020-05-23 MED ORDER — NICOTINE 7 MG/24 HR DAILY TRANSDERMAL PATCH
7 mg | Freq: Every day | TRANSDERMAL | Status: DC
Start: 2020-05-23 — End: 2020-05-23

## 2020-05-23 MED ORDER — CEFAZOLIN 1 GRAM SOLUTION FOR INJECTION
1 gram | INTRAVENOUS | Status: DC | PRN
Start: 2020-05-23 — End: 2020-05-23
  Administered 2020-05-23: 11:00:00 1 gram via INTRAVENOUS

## 2020-05-23 NOTE — Other
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  -  1 - Mobile Heartbeat message sent to [place], D at 1:47 PM. Received response at 14:02.  Trudee Grip IV, PCT  05/23/2020  1:47 PM  Consult [facility] [phone]

## 2020-05-23 NOTE — Progress Notes
Urologic Surgery Progress Note    Attending Provider: Luetta Nutting, MD    Admit Date: 05/21/2020 Hospital Day: 3  Code status: Full Code/ACLS  Allergy: Patient has no known allergies.    Background:    Paul Hunter is a 69 y.o. male w/ PMH with NSCLC on chemotherapy, CAD (s/p PCI on ASA/Plavix), sleep apnea on CPAP, nephrolithiasis, and recurrent hematuria. He is s/p cystoscopy, clot evacuation, TURBT, left retrograde pyelogram and left ureteroscopy and ureteral biopsy on 9/26    Subjective/Interim Events:     - Urine color on post-operative check around 6PM was clear yellow  - Patient reports development of hematuria with concern for clot retention around 10PM  - 22 Fr 2-way exchanged for a 3-way catheter, and started on CBI  - CBI clotted off over night requiring manual irrigation  - CBI on wide open drip draining pink urine  - Last meal was prior to midnight, cup of coffee around 2AM    Objective:   Temp:  [97.2 ?F (36.2 ?C)-98 ?F (36.7 ?C)] 97.6 ?F (36.4 ?C)  Pulse:  [59-82] 82  Resp:  [10-20] 18  BP: (106-206)/(66-115) 151/85  SpO2:  [94 %-100 %] 95 %  Device (Oxygen Therapy): room air  O2 Flow (L/min):  [3-10] 3    I/O last 3 completed shifts:  In: 850 [I.V.:850]  Out: 1125 [Urine:1075; Blood:50]     CBC  Lab Results   Component Value Date    WBC 12.3 (H) 05/23/2020    HGB 10.6 (L) 05/23/2020    HCT 31.7 (L) 05/23/2020    PLT 207 05/23/2020     ELECTROLYTES  Lab Results   Component Value Date    NA 139 05/23/2020    K 4.3 05/23/2020    CL 105 05/23/2020    CO2 26 05/23/2020    CREATININE 1.37 (H) 05/23/2020    BUN 25 (H) 05/23/2020    GLU 132 (H) 05/23/2020    CALCIUM 8.1 (L) 05/23/2020    MG 2.3 05/12/2020     PT/INR/PTT  Lab Results   Component Value Date    INR 0.90 05/21/2020    PTT 23.1 05/21/2020     Microbiology:  Lab Results   Component Value Date    LABURIN No Growth 05/21/2020    LABURIN No Growth 05/14/2020    LABURIN SEE NOTE 05/03/2020    LABURIN No Growth 04/25/2020       PHYSICAL EXAM  Patient exam or treatment required medical chaperone.  The sensitive parts of the examination were performed with chaperone present: Yes; Chaperone Name, Role/Title: Orie Fisherman, RN     Gen: resting in bed in no acute distress  CV: regular rate and rhythm  Pulm: clear to auscultation bilaterally   Abd: soft, non-tender, non-distended  GU: CBI on wide-open drip draining pink urine     Assessment/Plan   Paul Hunter is a 69 y.o. male, Hospital Day: 3, PMH with NSCLC on chemotherapy, CAD (s/p PCI on ASA/Plavix), sleep apnea on CPAP, nephrolithiasis, and recurrent hematuria. He is s/p cystoscopy, clot evacuation, TURBT, left retrograde pyelogram and left ureteroscopy and ureteral biopsy on 9/26. Post-operative development of hematuria this evening requiring initiation of CBI with episode of clotting off. Manually irrigated for 50cc of clot. Given extent of hematuria, will plan on cystoscopy, clot evacuation, fulguration in the OR this morning.    NPO/IVF  Booked/consent for cystoscopy, clot evacuation and fulguration   Consent obtained  Continue CBI  Continuous bladder irrigation instructions:       - Titrate drip to a clear, translucent, pink urine output       - Recommend using 2 L saline bags, have one running at all time       - Match CBI input and output        - Stop CBI inflow if: patient develops suprapubic pain or if outflow becomes obstructed. In which case manually irrigate.       - Perform manual irrigation through main foley port (disconnect foley from bag), by injecting 120cc of sterile H2O using the 60cc catheter tipped syringe. Aspirate back 60 ml, instill 60 ml, aspirate back 60 ml, etc. Repeat as necessary until no clots and translucent pink or clear.        - Would have nursing perform manual irrigation every shift and PRN for clots, if foley stops draining, or if patient experiences suprapubic pain       - Some suprapubic discomfort and drainage of urine around catheter is expected due to bladder spasms    Signed:  Chrystine Oiler, MD    Floor Pager (for primary patients): [phone]  Consult Pager: [phone] from 6am-6pm.   Amion for nights/weekends.    Attending Addendum:

## 2020-05-23 NOTE — Anesthesia Post-Procedure Evaluation
Anesthesia Post-op NotePatient: Elmon Else AstorinoProcedure(s):  Procedure(s) (LRB):CYSTOSCOPY, CLOT EVACUATION,FULGURATION (N/A) Patient location: PACULast Vitals:  I have noted the vital signs as listed in the nursing notes.Mental status recovered: patient participates in evaluation: YesVital signs reviewed: YesRespiratory function stable:YesAirway is patent: YesCardiovascular function and hydration status stable: YesPain control satisfactory: YesNausea and vomiting control satisfactory:Yes

## 2020-05-23 NOTE — Telephone Encounter
Attending Surgeon: Dr. Malen Gauze                 Other Surgeon/Resident: _____               Contact Person: Dellis Filbert, MD  Phone Number: [phone]                                  Beeper Number: [phone]      Surgery Date: 05/23/2020      Inhouse Location: 12256/12256-A   Location:    ?EPOR   x SPOR                         ?WPOR  ?NPOR                ?Cysto   ?Lithotripsy                 Time preferred: ASAP           Priority LEVEL: 1  2  3  4                 (Circle only one)                 ? POST OP ICU BED        Medical Record Number: [id number]           Birthdate: [birth year]                Gender: male    Last Name: Hunter   First Name:  Paul Hua          Middle Initial: J    Patient Allergies: Patient has no known allergies.                           ?Known Latex Allergy    Special Needs:  ?Hard of hearing   ?Deaf - needs sign language interpreter   ?Interpreter Required/Language: ____________                              __ Other                                                                                                                                         Anticipated Anesthesia:  general    Side of Body: ________Bladder____________________________      CPT Code: 46962  Surgeon?s Description: Cystoscopy, clot evacuation, fulguration, possible bladder biopsy/TURBT    Diagnosis:        _____Hematuria______________________________                   ICD-10 Codes  R31.0                                                Estimated Length of Surgery:  1 hour____________________     Special Instructions for OR:  Positioning: __________________                                                                        Tables: _______________  Implants:                                                                                                                  Vendor Name/#    ___        Special Instruments:                     Special Equipment: Patient specific supplies:                                                                         Other Requests                                                          Placed by:                    Dellis Filbert, MD                                              Date:     05/23/20

## 2020-05-23 NOTE — Other
PACU to Floor Nursing Transfer NotePreop Diagnosis: Hematuria [R31.9]Procedure Done: * No post-op diagnosis entered *Any Significant Events Intra-Op: noneAbnormal Assessment in PACU: noneLevel of Consciousness: awake, alert  and orientedLast Set of VS:  Vitals:  05/23/20 0815 05/23/20 0830 05/23/20 0845 05/23/20 0900 BP: (!) 165/76 128/71 (!) 147/69 135/83 Pulse: 81 84 (!) 55 72 Resp: (!) 10 15 (!) 11 16 Temp:     TempSrc:     SpO2: 98% 96% (!) 93% (!) 93% Weight:     Height:     Device (Oxygen Therapy): room air O2 Flow (L/min): 2Baseline Neuro/developmental Status: WDLLabs Collected: N/ASpecial Needs of the Patient: noneAntibiotics: last dose given in OR: see MARIV Access: Periph IV-single(adult) 05/22/20 1446 metacarpal(dorsum of hand), right over-the-needle catheter system 18 gauge Anesthesia Resident (Active) SiteCare/Dressing Status/Securement dressing dry and intact 05/23/20 0910 Lumen 1 Patency/Care Patent;flushed w/o difficulty 05/23/20 0910 IV Fluids: Pain Assessment: Number Scale (0-10) 05/21/20 2300 generalized groin-Pain Rating (0-10): Rest: 6Pain Assessment: Number Scale (0-10) 05/21/20 2300 generalized groin-Pain Rating (0-10): Activity: 1 Pain Assessment: Number Scale (0-10) 05/21/20 2300 generalized groin-Pharmaceutical Interventions:  (team aware-lubricant applied)    Body Position: supine, head elevatedHead of Bed (HOB) Positioning: HOB at 20-30 degrees   Time of Last Void or Time that Urinary Catheter was Removed Intra-Op: catheter in place Additional Info: VSS on RA. AxOx4. Floey w/ CBI maintained. (+) burning - meds given & extra lubricnt applied-team aware. Safety maintained, see f/s for details. Contact RN (name and phone number): Selena Batten ,[place] PACU

## 2020-05-23 NOTE — Other
Post Operative Note:Attending Provider: Camila Li, MDAdmit Date: 9/25/2021Hospital Day: 3Day of Surgery s/p Procedure(s) (LRB):CYSTOSCOPY, CLOT EVACUATION,FULGURATION (N/A)Subjective: The patient is resting on the stretcher in no apparent distress.  The patient denies chest pain, shortness of breath, difficulty breathing, fever/chills, calf pain or N/V. Pain well controlled with medications. Objective: Temp:  [97.2 ?F (36.2 ?C)-98.5 ?F (36.9 ?C)] 98.4 ?F (36.9 ?C)Pulse:  [55-92] 92Resp:  [10-20] 20BP: (101-206)/(61-115) 101/61SpO2:  [93 %-100 %] 94 %Device (Oxygen Therapy): room airO2 Flow (L/min):  [2-10] 2I/O last 3 completed shifts:In: 850 [I.V.:850]Out: 4875 [Urine:4825; Blood:50]Physical Exam Performed with RN Annabelle Harman ButlerGeneral Appearance: Calm and cooperative.  In no apparent distress.Respiratory: Breathing comfortably on room air.  Symmetric Lung expansion bilaterally.Abdomen: Soft, appropriately tender to palpation. Genitourinary: Foley draining clear yellow urine on moderate CBI, switched to slow Extremities: Warm and well perfused.Neurological:  Alert and oriented x 3.  Answers questions appropriately. Lab Results Component Value Date  CREATININE 1.37 (H) 05/23/2020  BUN 25 (H) 05/23/2020  NA 139 05/23/2020  K 4.3 05/23/2020  CL 105 05/23/2020  CO2 26 05/23/2020 Lab Results Component Value Date  WBC 12.3 (H) 05/23/2020  HGB 10.2 (L) 05/23/2020  HCT 31.1 (L) 05/23/2020  MCV 96.1 (H) 05/23/2020  PLT 207 05/23/2020 Assessment 69 y.o. male, Hospital Day: 3, Day of Surgery s/p Procedure(s) (LRB):CYSTOSCOPY, CLOT EVACUATION,FULGURATION (N/A), doing well postoperatively on the floor, tolerating a diet and pain well controlled. Plan PLAN: - Continue CBI titrate down to light pink: currently on slow drip - Bladder spasms: Belladonna and opium suppositories, oxybutynin- Burning at tip of penis: lidocaine jelly - Continue to hold Plavix - Please notify Urology if patient develops hematuria ?Continuous bladder irrigation instructions:     - Titrate drip to a clear, translucent, pink urine output     - Recommend using 2 L saline bags, have one running at all time     - Match CBI input and output      - Stop CBI inflow if: patient develops suprapubic pain or if outflow becomes obstructed. In which case manually irrigate.     - Perform manual irrigation through main foley port (disconnect foley from bag), by injecting 120cc of sterile H2O using the 60cc catheter tipped syringe. Aspirate back 60 ml, instill 60 ml, aspirate back 60 ml, etc. Repeat as necessary until no clots and translucent pink or clear.      - Would have nursing perform manual irrigation every shift and PRN for clots, if foley stops draining, or if patient experiences suprapubic pain     - Some suprapubic discomfort and drainage of urine around catheter is expected due to bladder spasms?Marik Sedore D. Lulamae Skorupski [id number] Urology/Surgery

## 2020-05-23 NOTE — Other
Post Anesthesia Transfer of Care NotePatient: Paul Else AstorinoProcedure(s) Performed: Procedure(s) (LRB):CYSTOSCOPY, CLOT EVACUATION,FULGURATION (N/A) Patient location: PACU Last Vitals: Vitals Value Taken Time BP 172/90 05/23/20 0757 Temp 36.9 ?C 05/23/20 0757 Pulse 78 05/23/20 0758 Resp 14 05/23/20 0758 SpO2 96 % 05/23/20 0758 Vitals shown include unvalidated device data.Level of consciousness: awakeTransport Vital Signs:  Stable since the last set of recorded intra-operative vital signsComplications: noneIntra-operative Intake & Output and Antibiotics as per Anesthesia record and discussed with the RN.

## 2020-05-23 NOTE — Other
Operative Diagnosis:Pre-op:   Hematuria [R31.9] Patient Coded Diagnosis   Pre-op diagnosis: Hematuria  Post-op diagnosis: Hematuria  Patient Diagnosis   Pre-op diagnosis: Hematuria [R31.9]  Post-op diagnosis:     Post-op diagnosis:   * Hematuria [R31.9]Operative Procedure(s) :Procedure(s) (LRB):CYSTOSCOPY, CLOT EVACUATION,FULGURATION (N/A)Post-op Procedure & Diagnosis ConfirmationPost-op Diagnosis: Post-op Diagnosis confirmed (no changes)Post-op Procedure: Post-op Procedure confirmed (no changes)

## 2020-05-23 NOTE — Plan of Care
Problem: Adult Inpatient Plan of CareGoal: Plan of Care ReviewOutcome: Interventions implemented as appropriateGoal: Patient-Specific Goal (Individualized)Outcome: Interventions implemented as appropriateGoal: Absence of Hospital-Acquired Illness or InjuryOutcome: Interventions implemented as appropriateGoal: Optimal Comfort and WellbeingOutcome: Interventions implemented as appropriateGoal: Readiness for Transition of CareOutcome: Interventions implemented as appropriate Problem: Fall Injury RiskGoal: Absence of Fall and Fall-Related InjuryOutcome: Interventions implemented as appropriate Plan of Care Overview/ Patient Status    1900-0700: Pt A&Ox4, SB assist with cane from home when OOB, BA on for safety. HTN 140s-150s systolic overnight, taking scheduled metoprolol per MAR. Otherwise VSS. Afebrile. Satting WNL on RA. RSL POC (9/25) dressing CDI, patent, +flush, +BR, saline locked. Regular diet, but NPO since midnight for OR procedure this morning. Following the cystoscopy procedure yesterday, pt had a 22 french foley in place. Frank red blood was present in the foley bag and tubing with large clots obstructing the flow. MD notified and urology to bedside. Foley was irrigated multiple times and ultimately unsuccessful in maintaining patency. CBI was initiated and pt was scheduled for a repeat cystoscopy this AM. HGB stable this morning. LBM 9/23. Up to toilet. Skin intact. C/o penile pain x1, PRN roxi given with good effect. PRN belladonna suppository also given x1 for bladder spasms. Pt off floor in the OR this AM.

## 2020-05-23 NOTE — Anesthesia Pre-Procedure Evaluation
Review of Systems/ Medical HistoryPatient summary, nursing notes, EKG/Cardiac Studies , Labs, pre-procedure vitals, height, weight and NPO status reviewed.No previous anesthesia concernsAnesthesia Evaluation:   No history of anesthetic complications  Estimated body mass index is 41.56 kg/m? as calculated from the following:  Height as of this encounter: 6' (1.829 m).  Weight as of this encounter: 139 kg. CC/HPI: This is a 69 y.o. male with pmhx of NSCLC on chemo, CAD (s/p PCI) with concern for worsening CAD iso chemo, sleep apnea, and recurrent hematuria scheduled for CYSTOSCOPY, CLOT EVACUATION, POSSIBLE FULGURATION POSSIBLE BLADDER RESECTION (N/A ) for hematuriaPast Medical HistoryNo date: AnxietyNo date: CAD (coronary artery disease)No date: Diabetes mellitus (HC Code)No date: GERD (gastroesophageal reflux disease)No date: HyperlipidemiaNo date: HypertensionNo date: Hypertrophy of prostate without urinary obstruction and other lower urinary tract symptoms (LUTS)No date: Other testicular hypofunction02/09/2018: Spinal stenosisNo date: Tobacco abuseNo date: Vitamin D deficiencyNKDALast airway: None per chart review Past Surgical History:  Past Surgical History:08/28/2003: CAROTID STENT03/14/2018: CHOLECYSTECTOMYNo date: KNEE SURGERY    Comment:  torn meniscus02/06/2009: LAMINECTOMYNo date: LUMBAR DISC SURGERY    Comment:  herniated discCardiovascular:Patient has a history of: hypertension.  Dyspnea at rest. -Exercise tolerance: <4 METS   SOB on 1 flight of stairs-Coronary Artery Disease: CAD, MI and cardiac stents (1 stent in 1997, 3 stents 2014) -Vascular Disease:  Negative   -Other Cardiovascular:  ECHO 04/2020: * Normal left ventricular size, systolic function and wall motion. Moderate concentric left ventricular hypertrophy.  LVEF calculated by biplane Simpson's was 68%.  Abnormal tissue Doppler suggestive of abnormal diastolic function.  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  Previous study ascending aorta size was 4.4cm* Inferior vena cava was not well visualized.* Compared with the prior study, dated 02/08/2020, there are changes noted.  Wall motion in inferior and inferoseptal walls noted on current study, though compared to prior image, no significant change in wall motion abnormality.Stress test 2021: * Highly abnormal PET myocardial perfusion study following pharmacologic vasodilation with regadenoson and at rest with high risk features.  Perfusion imaging was abnormal showing a large sized, moderate to severe intensity, reversible perfusion defect in the basal to apical inferior and inferolateral walls consistent with ischemia; and a medium to large sized, moderate intensity, reversible perfusion defect in the mid to apical anterior and anterolateral walls consistent with ischemia.  There is visual transient ischemic dilation  There was transient post-stress LV dysfunction, resting ejection fraction 40% and stress ejection fraction 36% with abnormal regional wall motion showing global hypokinesis and inferior akinesis.  The left ventricle was severely enlarged in size.  Stress electrocardiogram was abnormal due to ischemic ECG changes.  Kendrick performed for attenuation correction showed coronary artery calcification in the left main artery (moderate), left anterior descending artery (severe), diagonal artery (severe), left circumflex artery (mild), and right coronary artery (mild).    Non-cardiac findings of the [place] are described in a separate report from Radiology.  No prior study available for comparison.* Global myocardial blood flow was reduced at 0.78 ml/g/min during stress and normal at 0.68 ml/g/min during rest.  Global myocardial flow reserve was reduced at 1.15.* These findings highly suspicious for multi-vessel CAD. Respiratory:  Patient has shortness of breath.-Obstructive Sleep apnea:   yes, CPAP use.  -Lung Disorders: -COPD:  yesHEENT: Negative.Neuromuscular:  Patient has a history  of no seizures.-Intracranial disorders:  He did not have a cerebrovascular accident-Neuropathy: peripheral neuropathySpine/Spinal Cord Disorders:  History of degenerative disc disease.Gastrointestinal/Genitourinary: -Gastrointestinal Disorders:  Patient has GERD.Hematological/Lymphatic: -Anemia: Patient has anemia and acute blood loss anemia.  Endocrine/Metabolic: -Diabetes mellitus:  Patient has diabetes mellitus (pateint states he has never been diagnosed with diabetes).Behavioral/Psychiatric & Syndromes:  Patient has depression.Additional Findings: VITAL SIGNS:Temp:  (36.4 ?C-37.2 ?C) 36.5 ?CPulse:  (70-98) 79Resp:  (16-20) 18BP: (124-179)/(64-96) 173/89SpO2:  (93 %-98 %) 95 %Device (Oxygen Therapy): room airLABS:Lab Results     Component                Value               Date                     WBC                      10.6 (H)            05/22/2020               HGB                      11.2 (L)            05/22/2020               HCT                      33.7 (L)            05/22/2020               PLT                      191                 05/22/2020               CHOL                     141                 05/11/2020               TRIG                     133                 05/11/2020               HDL                      39 (L)              05/11/2020               LDL                      75                  05/11/2020               ALT                      44  05/21/2020               AST                      18                  05/21/2020 NA                       141                 05/22/2020               K                        4.2                 05/22/2020               CL                       106                 05/22/2020               CREATININE               1.42 (H)            05/22/2020               BUN                      27 (H)              05/22/2020               CO2                      25                  05/22/2020               TSH                      0.902               05/11/2020               INR                      0.90                05/21/2020               GLU                      131 (H)             05/22/2020               HGBA1C                   5.6                 04/08/2020          Physical ExamCardiovascular:    normal exam  Rhythm: regularHeart Sounds: S1 present and S2 present.Pulmonary:  decreased breath sounds and wheezing (scant bilateral wheezes L  worse than R, not diffuse, no respiratory distress)Airway:  Mallampati: IIITM distance: >3 FBNeck ROM: fullDental:  normal exam  Anesthesia PlanASA 3 The primary anesthesia plan is  general ETT. SASAM, PIV, Multimodal Analgesia, Post op PACUPerioperative Code Status confirmed: It is my understanding that the patient is currently designated as 'Full Code' and will remain so throughout the perioperative period.Anesthesia informed consent obtained. Consent obtained from: patientUse of blood products: consented  The post operative pain plan is IV analgesics and per surgeon management.Plan discussed with Attending and Resident.Anesthesiologist's Pre Op NoteI personally evaluated and examined the patient prior to the intra-operative phase of care.

## 2020-05-23 NOTE — Brief Op Note
[facility]        [id number] Patient DOB: 1951/05/12     Surgery Date: 9/27/2021Surgeon(s) and Role:   Malen Gauze, Jabier Mutton., MD - PrimaryAssistant(s):Resident: Doylene Canning, Normand Sloop, MD; Cleotis Lema, MDStaff:  Circulator: Bland Span, RN; Gus Puma, RNScrub Person: Shelbie Ammons, RN; Vivi Ferns, ElizabethPre-Op Diagnosis: Hematuria [R31.9] Procedure(s) and Anesthesia Type:   * CYSTOSCOPY, CLOT EVACUATION,FULGURATION - GENERALOperative Findings (enter relevant operative findings; do not refer to an operative report that is not yet transcribed): large organized clot within the bladder 400cc. CBI running clear at end of caseSigns of infection present at the time of surgery at the operative site: None Blood and Blood Products: none                 Drains:  none and Urinary Catheter (Foley)Implants: * No implants in log * Specimens: * No specimens in log * Clinical Staging: N/AEBL: Minimal from this portion of case (400cc of old clot)       Post Operative Diagnosis: * No post-op diagnosis entered * Bradly Sangiovanni A Chakita Mcgraw, MD9/27/20218:04 AM

## 2020-05-23 NOTE — Plan of Care
Problem: Adult Inpatient Plan of Care  Goal: Readiness for Transition of Care  Outcome: Interventions implemented as appropriate     CM Assessment and Discharge Planning        Most Recent Value   Case Management Assessment   Do you have a caregiver? No   Patient Requires Care Coordination Intervention Due To discharge planning needs/concerns, unscheduled re-admission within 7 days, readmission within the last 30 days   Arrived from prior to admission clinic   Admitted from: [facility]   Bed Hold  n/a   Services Prior to Admission none   ADL Assistance None   Type of Home Care Services None   Equipment Currently Used at Home cane, straight, CPAP/BiPAP, nebulizer   Documented Insurance Accurate Yes  [[employer] [place]]   Patient's home address verified Yes   Patient's PCP of record verified No   Living Environment    Lives With Alone  [Lives in in law apartment of niece's home]   Current Living Arrangements home/apartment/condo   Home Accessibility bed and bath on same level, no concerns   Transportation Available car, family or friend will provide   Source of Clinical History   Patient's clinical history has been reviewed and source of Information is: Medical record   CM/SW Attestation: Choose which ONE is appropriate for you   I have reviewed the medical record and completed the above evaluation with the following recommendations. Yes   Discharge Planning Coordination Recommendations   Discharge Planning Coordination Recommendations Needs not determined at this time   RN reviewed plan of care/ continuum of care need's with  Transitional care rounds   Discharge Planning   Concerns to be Addressed care coordination/care conferences, discharge planning           Plan of Care Overview/ Patient Status      Per H&P: Paul Hunter is a 69 y.o. male, Hospital Day: 3, PMH with NSCLC on chemotherapy, CAD (s/p PCI on ASA/Plavix), sleep apnea on CPAP, nephrolithiasis, and recurrent hematuria. He is s/p cystoscopy, clot evacuation, TURBT, left retrograde pyelogram and left ureteroscopy and ureteral biopsy on 9/26. Post-operative development of hematuria this evening requiring initiation of CBI with episode of clotting off. Manually irrigated for 50cc of clot.  S/p * CYSTOSCOPY, CLOT EVACUATION,FULGURATION today  Medical record reviewed. Patient known from previous admission. Mr. [name] is alert and oriented.He resides in an in law apartment connected to his niece's home in [place]. Prior to admission, patient used a cane at baseline. He is independent with ADLs and self foley care. No home care services were in place. Uses nocturnal CPAP. Access: port. Assessment screening completed. Continue to follow patient's progress and discuss plan of care with treatment team.  Discharge needs not determined at this time. Care Management will continue to follow with team.   Francis Gaines R.N. BS. Case Management  [facility]  [address]  [place], [place] [address]  MHB: [phone]

## 2020-05-23 NOTE — Other
[facility]       Urology Post-Op Check    Attending Provider: Luetta Nutting, MD    Admit Date: 05/21/2020  Day of Surgery s/p cystoscopy, clot evacuation, TURBT, left retrograde pyelogram and left ureteroscopy and ureteral biopsy    Procedure Details:     CASE LENGTH:  2 Hr 11 Min 0 Sec  COMPLICATIONS: None  Date 05/22/20 0700 - 05/23/20 0659   Shift 2376-2831 1500-2259 2300-0659 24 Hour Total   INTAKE   I.V.(mL/kg) 200(1.4) 650(4.7)  850(6.1)   Shift Total(mL/kg) 200(1.4) 650(4.7)  850(6.1)   OUTPUT   Urine(mL/kg/hr) 300(0.3)   300   Blood  50  50   Shift Total(mL/kg) 300(2.2) 50(0.4)  350(2.5)   Weight (kg) 139 139 139 139       Subjective:   Paul Hunter is a 69 y.o. male patient  POD#0 s/p cystoscopy, clot evacuation, TURBT, left retrograde pyelogram and left ureteroscopy and ureteral biopsy  Principle Diagnosis: gross hematuria  Past Medical History:   Diagnosis Date   ? Anxiety    ? CAD (coronary artery disease)    ? Diabetes mellitus (HC Code)    ? Difficult intubation 05/22/2020    Patient with significant soft tissue in oropharynx. Glidescope intubation revealed anterior cords with floppy soft tissue obstruction. Patient will require video laryngoscopy for airway management going forward.   ? GERD (gastroesophageal reflux disease)    ? Hyperlipidemia    ? Hypertension    ? Hypertrophy of prostate without urinary obstruction and other lower urinary tract symptoms (LUTS)    ? Other testicular hypofunction    ? Spinal stenosis 09/28/2018   ? Tobacco abuse    ? Vitamin D deficiency      No past surgical history pertinent negatives on file.    NAD. Patient tolerated procedure well and transferred to floor from PACU. Having bladder spasms and penile pain that responded well to medications.  Denies n/v. Voiding yellow urine with occasional pink tinge via Foley. Pain well controlled with medications. Denies chest pain, shortness of breath. Appreciative for understanding why he was bleeding. Objective:   Temp:  [97.2 ?F (36.2 ?C)-99 ?F (37.2 ?C)] 97.4 ?F (36.3 ?C)  Pulse:  [59-89] 59  Resp:  [10-20] 20  BP: (124-206)/(64-115) 143/79  SpO2:  [93 %-100 %] 99 %  Device (Oxygen Therapy): room air  O2 Flow (L/min):  [3-10] 3  SpO2: 99 %  Device (Oxygen Therapy): room air    I/O last 3 completed shifts:  In: 200 [I.V.:200]  Out: 1400 [Urine:1400]    Results in Past 7 Days  Result Component Current Result   Hematocrit 33.7 (L) (05/22/2020)   Hemoglobin 11.2 (L) (05/22/2020)   MCH 31.3 (H) (05/22/2020)   MCHC 33.2 (05/22/2020)   MCV 94.1 (H) (05/22/2020)   MPV 9.7 (05/22/2020)   Platelets 191 (05/22/2020)   RBC 3.6 (L) (05/22/2020)   WBC 10.6 (H) (05/22/2020)     Lab Results   Component Value Date    CREATININE 1.42 (H) 05/22/2020    BUN 27 (H) 05/22/2020    NA 141 05/22/2020    K 4.2 05/22/2020    CL 106 05/22/2020    CO2 25 05/22/2020       PHYSICAL EXAM  Gen: well appearing, resting in bed in no acute distress  CV: regular rate  Pulm: comfortable on room air  Abd: soft, rounded, non-tender. No CVA tenderness bilaterally   GU: Foley draining clear yellow  urine with occasional pink tinge  Ext: warm, well perfused     Current Medications:     Current Facility-Administered Medications   Medication   ? belladonna alkaloids-opium (B&O SUPPRETTES) 16.2-30 mg suppository 1 suppository   ? escitalopram oxalate (LEXAPRO) tablet 10 mg   ? [Held by provider] furosemide (LASIX) tablet 40 mg   ? gabapentin (NEURONTIN) capsule 600 mg   ? metoprolol succinate (TOPROL-XL) 24 hr tablet 12.5 mg   ? nicotine (NICODERM CQ) transdermal patch 24 hr 14 mg   ? oxybutynin XL (DITROPAN-XL) 24 hr tablet 5 mg   ? oxyCODONE (ROXICODONE) Immediate Release tablet 5 mg   ? predniSONE (DELTASONE) tablet 100 mg   ? [START ON 05/26/2020] predniSONE (DELTASONE) tablet 90 mg   ? rosuvastatin (CRESTOR) tablet 40 mg   ? senna-docusate (SENNA-PLUS) 8.6-50 mg per tablet 1 tablet   ? sodium chloride 0.9 % flush 3 mL   ? sodium chloride 0.9 % flush 3 mL Assessment   Pt is a 69 y.o. y/o male Day of Surgery s/p cystoscopy, clot evacuation, TURBT, left retrograde pyelogram and left ureteroscopy and ureteral biopsy. 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 Old blood clots and blood effluxing from left ureteral orifice once revealed, along with left proximal ureteral obstruction on left retrograde pyelogram (no contrast advancing past left proximal ureter) with inability to advance 5 Fr ureteral catheter past point of obstruction along with papillary proximal left ureteral tumor on ureteroscopy which was biopsied.     Plan     - Maintain foley catheter till Wednesday  - Aggressive Bowel regimen  - Pyridium for bladder spasms, B&O suppositories for bladder spasms   - Follow up surgical pathology  - Hold DAPT until urine is clear for 48 hours     Electronically Signed By:  Chrystine Oiler, MD      ADDENDUM:    Patient started on continuous bladder irrigation overnight due to concern for clotting off 22 Fr 2-way catheter.  Replace with a 22Fr 3-way catheter and initiated continuous bladder irrigation.   Ceftriaxone x1 for irrigation      Continuous bladder irrigation instructions:       - Titrate drip to a clear, translucent, pink urine output       - Recommend using 2 L saline bags, have one running at all time       - Match CBI input and output        - Stop CBI inflow if: patient develops suprapubic pain or if outflow becomes obstructed. In which case manually irrigate.       - Perform manual irrigation through main foley port (disconnect foley from bag), by injecting 120cc of sterile H2O using the 60cc catheter tipped syringe. Aspirate back 60 ml, instill 60 ml, aspirate back 60 ml, etc. Repeat as necessary until no clots and translucent pink or clear.        - Would have nursing perform manual irrigation every shift and PRN for clots, if foley stops draining, or if patient experiences suprapubic pain       - Some suprapubic discomfort and drainage of urine around catheter is expected due to bladder spasms

## 2020-05-23 NOTE — Progress Notes
0700-1900?Pt A&Ox4, ind OOB w/ cane. RSL port patent, c/d/i, +BBR, capped, R#18 patent, c/d/i. VSS, O2 WNL on RA, afebrile. Cardiac diet. Pt off floor to OR for cystoscopy and clot removal at beginning of shift, returned w/ CBI which should be titrated to clear pink urine, urine currently clear pink but had been clear yellow for much of shift (see addendum below). 5mg  PO oxy admin upon return to floor. LBM 9/23, Miralax admin, pt also requests senna before bed. Tums and Pepcid admin for acid reflux. Nicotine patches to L arm. Safety maintained, [id number]: Belladonna suppository admin for spasms, Foley position adjusted, flushed w/ 120cc, no clots noted but some pink output, CBI titrated to faster rate, pt tolerating well and endorses feeling better.NPO @ midnight for bronch.

## 2020-05-23 NOTE — Progress Notes
PHARMACY-ASSISTED MEDICATION REPORT    Pharmacist review of the best possible medication history obtained by the pharmacy medication history technician has been performed.      I have updated the home medication list and identified the following information that may be relevant to this admission.      NOTES/RECOMMENDATIONS  Rosuvastatin used as formulary equivalent in place of atorvastatin  Consider adding carboxymethylcellulose (REFRESH) eye drops if needed  Consider cetirizine as formulary equivalent to fexofenadine, if needed during admission   Cardiology consult placed to determine plan for clopidogrel/aspirin continuation  Ongoing discussion regarding plan for HD-steroid prednisone taper for question of ICI-induced myocarditis                       Prior to Admission Medications     Medication Name Sig Taking? Patient Reported     Discontinued: 03/25/2020 11:39 AM  Last dose:  --   Last Medication Note: >> Ernst Bowler   Sun May 22, 2020  9:51 AM  MedHx Tech(Keana Conyers, CPHT): FLAG FOR REMOVAL -  confirmed discontinuation with discharge paper work. Was recently discharged from [facility]    Entered by Ernst Bowler, CPHT Sun May 22, 2020 0951   Yes       aspirin 81 MG EC tablet  Last dose:  --  Take 81 mg by mouth daily. Yes Yes       atorvastatin (LIPITOR) 80 mg tablet  Last dose:  --  Take 1 tablet (80 mg total) by mouth nightly. Yes         carboxymethylcellulose (REFRESH PLUS) 0.5 % ophthalmic solution in dropperette  Last dose:  --  Place 1 drop into both eyes 3 (three) times daily as needed. Yes Yes       escitalopram oxalate (LEXAPRO) 10 mg tablet  Last dose:  --  Take 1 tablet (10 mg total) by mouth nightly. Yes         fexofenadine (ALLEGRA) 180 mg tablet  Last dose:  --   Last Medication Note: >> Ernst Bowler   Tue May 10, 2020  2:38 PM  MedHx Tech(Keana Conyers, CPHT): FLAG FOR REMOVAL -  Patient started and stopped reported OTC/Vitamin on their own    Entered by Ernst Bowler, CPHT Tue May 10, 2020 1438 Take 180 mg by mouth daily as needed.    Yes       furosemide (LASIX) 40 mg tablet  Last dose:  --  Take 1 tablet (40 mg total) by mouth daily. Yes         gabapentin (NEURONTIN) 300 mg capsule  Last dose:  --  Take 2 capsules (600 mg total) by mouth at bedtime. Yes         metoprolol succinate XL (TOPROL-XL) 25 mg 24 hr tablet  Last dose:  --  Take 0.5 tablets (12.5 mg total) by mouth daily. Take with or immediately following a meal. Yes         multivitamin capsule  Last dose:  --   Last Medication Note: >> Sampson Goon May 22, 2020  9:59 AM     Entered by Ernst Bowler, CPHT Sun May 22, 2020 0981 Take 1 capsule by mouth daily.  Yes Yes       oxybutynin XL (DITROPAN-XL) 5 mg 24 hr tablet  Last dose:  --  Take 1 tablet (5 mg total) by mouth daily. Yes         oxyCODONE (  ROXICODONE) 5 mg Immediate Release tablet  Last dose:  --  Take 1 tablet (5 mg total) by mouth every 4 (four) hours as needed for up to 5 doses. Yes         predniSONE (DELTASONE) 10 mg tablet  Last dose:  --  Take 13 tablets (130 mg total) by mouth daily for 7 days, THEN 12 tablets (120 mg total) daily for 7 days, THEN 11 tablets (110 mg total) daily for 7 days, THEN 10 tablets (100 mg total) daily for 7 days, THEN 9 tablets (90 mg total) daily for 7 days, THEN 8 tablets (80 mg total) daily for 7 days, THEN 7 tablets (70 mg total) daily for 7 days, THEN 6 tablets (60 mg total) daily for 7 days, THEN 5 tablets (50 mg total) daily for 7 days, THEN 4 tablets (40 mg total) daily for 7 days. Take with food.. Yes         predniSONE (DELTASONE) 10 mg tablet  Last dose:  --  Take 3 tablets (30 mg total) by mouth daily for 7 days, THEN 2 tablets (20 mg total) daily for 7 days, THEN 1 tablet (10 mg total) daily for 7 days, THEN 0.5 tablets (5 mg total) daily for 7 days, THEN 0.5 tablets (5 mg total) daily for 7 days. Take with food.. Yes         Prior to admission medications last reviewed by Ernst Bowler, CPHT on Sun May 22, 2020 1000 Thank you,  Markus Jarvis, PharmD  05/23/2020  11:32 AM  Phone: [facility]

## 2020-05-23 NOTE — Other
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  -  1 - Mobile Heartbeat message sent to [place], G at 1:45 PM. Received response at 16:08.  Trudee Grip IV, PCT  05/23/2020  1:45 PM  Consult [facility] [phone]

## 2020-05-23 NOTE — Other
[facility]             Operative Note    CONFIDENTIAL - DO NOT COPY WITHOUT APPROPRIATE AUTHORIZATION     Name: Paul Hunter  MRN: [id number]  CSN: [id number]  Service Area: Urology  Date of Birth: Aug 15, 1951   Date of Adm: 05/21/2020    Date of Procedure/Surgery: 05/23/2020    Operation:   Cystoscopy, clot evacuation, fulguration of tumor base     Pre-Operative Diagnosis:   Gross Hematuria     Post-Operative Diagnosis:   Same     Surgeon:   Attending: Catalina Antigua., MD    Assistant:   Resident: Marchelle Gearing, MD; Cleotis Lema, MD    Anesthesia:   ANESTHESIOLOGIST: Nathanial Rancher, MD  CRNA: Clover Mealy, CRNA  Anesthesia Resident: Katina Degree, MD  Type: General    Estimated Blood Loss: minimal    Specimens: none    Drains/Devices:  22 Fr 3-way hematuria catheter with 30cc in balloon    Findings:   Large organized clot within the bladder, arterial bleeding immediately lateral to the left ureteral orifice, left ureteral orifice visualized at end of the case with efflux of blood tinged urine    Disposition: PACU to floor    Indications:   This is a 69 y.o. male with a history of recurrent gross hematuria who underwent cystoscopy, clot evacuation, TURBT and left retrograde pyelogram with left ureteroscopy and ureteral tumor biopsy on 9/26, who developed post-operative hematuria. He was re-initiated on continuous bladder irrigation however had poor drainage through his catheter. He was therefore scheduled for an emergent cystoscopy, clot evacuation and fulguration. Risks, benefits and alternatives were discussed with the patient and he elected to proceed with the above surgical procedure.     Procedure:   Informed consent was confirmed, with patient having the opportunity to ask any relevant questions prior to the procedure. The patient was transferred to the operating room. A time out compliant with Universal Protocol was performed with the patient awake and participating. General anesthesia was induced. Prophylactic antibiotics were administered in the form of Ancef based on prior cultures. The patient was positioned in dorsal lithotomy with all pressure points appropriately padded. The surgical field was sterilely prepared and draped. Given concern for irrigation of the catheter, his abdomen was also prepped. He was also consented for possible retrograde cystogram and exploratory laparotomy.      Prior to starting the procedure, an additional time out was performed to again confirm patient, positioning, procedure, laterality, preoperative medications, and operative and anesthesia plans. We first inserted the 30 degree, 22Fr cystoscope and entered the bladder atraumatically. We encounter a large organized clot within the bladder. We then switched to a 26 Fr resectoscope with a monopolar cautery loop and visually entered the urethra into the bladder. After cold looping pieces of the clot from the bladder, we were able to visualize an arterial vessel pumping immediately lateral to the left ureteral orifice at our prior site of resection. We used spot cautery to achieve hemostasis. We then continued to cold loop pieces of the large organized clot. We then utilized a glass To[place]y syringe and evacuated approximately 400cc of clot from the bladder. After all the clot was evacuated, we cauterized the entire base of the resection site with monopolar cautery. Care was taken to avoid cauterizing the left ureteral orifice. We confirmed hemostasis with the flow off and the bladder decompressed. At the end of the  case, we were able to visualize the left ureteral orifice and saw blood tinged effux from the left ureteral orifice, secondary to known upper tract disease from that side. We removed the resectoscope. We then placed a 22 Fr Hematuria catheter and initiated continuous bladder irrigation with 30cc in the balloon. CBI was draining clear irrigant at the end of the case. This concluded the procedure.      The patient was awakened from anesthesia and transferred to the recovery room in stable, satisfactory condition. A surgical debrief was performed. There were no immediate complications. A hemoglobin and hematocrit was sent during the case.     Catalina Antigua., MD was present and scrubbed for all critical portions of the procedure.    PLAN:   - Continue CBI  - follow up Hg/Hct  - Bladder spasms: Belladonna and opium suppositories, oxybutynin  - Continue to hold Plavix   - Please notify Urology if patient develops hematuria     Continuous bladder irrigation instructions:       - Titrate drip to a clear, translucent, pink urine output       - Recommend using 2 L saline bags, have one running at all time       - Match CBI input and output        - Stop CBI inflow if: patient develops suprapubic pain or if outflow becomes obstructed. In which case manually irrigate.       - Perform manual irrigation through main foley port (disconnect foley from bag), by injecting 120cc of sterile H2O using the 60cc catheter tipped syringe. Aspirate back 60 ml, instill 60 ml, aspirate back 60 ml, etc. Repeat as necessary until no clots and translucent pink or clear.        - Would have nursing perform manual irrigation every shift and PRN for clots, if foley stops draining, or if patient experiences suprapubic pain       - Some suprapubic discomfort and drainage of urine around catheter is expected due to bladder spasms

## 2020-05-24 ENCOUNTER — Encounter: Admit: 2020-05-24 | Payer: PRIVATE HEALTH INSURANCE | Attending: Medical

## 2020-05-24 ENCOUNTER — Ambulatory Visit: Admit: 2020-05-24 | Payer: PRIVATE HEALTH INSURANCE

## 2020-05-24 ENCOUNTER — Inpatient Hospital Stay: Admit: 2020-05-24 | Payer: PRIVATE HEALTH INSURANCE | Attending: Emergency

## 2020-05-24 DIAGNOSIS — I10 Essential (primary) hypertension: Secondary | ICD-10-CM

## 2020-05-24 DIAGNOSIS — K219 Gastro-esophageal reflux disease without esophagitis: Secondary | ICD-10-CM

## 2020-05-24 DIAGNOSIS — T884XXA Failed or difficult intubation, initial encounter: Secondary | ICD-10-CM

## 2020-05-24 DIAGNOSIS — N3091 Cystitis, unspecified with hematuria: Secondary | ICD-10-CM

## 2020-05-24 DIAGNOSIS — F419 Anxiety disorder, unspecified: Secondary | ICD-10-CM

## 2020-05-24 DIAGNOSIS — E291 Testicular hypofunction: Secondary | ICD-10-CM

## 2020-05-24 DIAGNOSIS — N400 Enlarged prostate without lower urinary tract symptoms: Secondary | ICD-10-CM

## 2020-05-24 DIAGNOSIS — E119 Type 2 diabetes mellitus without complications: Secondary | ICD-10-CM

## 2020-05-24 DIAGNOSIS — Z720 Tobacco use: Secondary | ICD-10-CM

## 2020-05-24 DIAGNOSIS — I2510 Atherosclerotic heart disease of native coronary artery without angina pectoris: Secondary | ICD-10-CM

## 2020-05-24 DIAGNOSIS — E559 Vitamin D deficiency, unspecified: Secondary | ICD-10-CM

## 2020-05-24 DIAGNOSIS — E785 Hyperlipidemia, unspecified: Secondary | ICD-10-CM

## 2020-05-24 DIAGNOSIS — M4800 Spinal stenosis, site unspecified: Secondary | ICD-10-CM

## 2020-05-24 LAB — COMPREHENSIVE METABOLIC PANEL
BKR A/G RATIO: 1 (ref 1.0–2.2)
BKR ALANINE AMINOTRANSFERASE (ALT): 23 U/L (ref 9–59)
BKR ALBUMIN: 2.9 g/dL — ABNORMAL LOW (ref 3.6–4.9)
BKR ALKALINE PHOSPHATASE: 70 U/L (ref 9–122)
BKR ANION GAP: 10 g/dL — ABNORMAL LOW (ref 7–17)
BKR ASPARTATE AMINOTRANSFERASE (AST): 24 U/L — ABNORMAL LOW (ref 10–35)
BKR AST/ALT RATIO: 1 x 1000/??L (ref 0.0–1.0)
BKR BILIRUBIN TOTAL: 0.2 mg/dL — ABNORMAL HIGH (ref <=1.2)
BKR BLOOD UREA NITROGEN: 28 mg/dL — ABNORMAL HIGH (ref 8–23)
BKR BUN / CREAT RATIO: 18.2 (ref 8.0–23.0)
BKR CALCIUM: 8.6 mg/dL — ABNORMAL LOW (ref 8.8–10.2)
BKR CHLORIDE: 104 mmol/L — ABNORMAL HIGH (ref 98–107)
BKR CO2: 22 mmol/L (ref 20–30)
BKR CREATININE: 1.54 mg/dL — ABNORMAL HIGH (ref 0.40–1.30)
BKR EGFR (AFR AMER): 55 mL/min/{1.73_m2} (ref >60)
BKR EGFR (NON AFRICAN AMERICAN): 45 mL/min/{1.73_m2} (ref >60)
BKR GLOBULIN: 3 g/dL (ref 2.3–3.5)
BKR GLUCOSE: 142 mg/dL — ABNORMAL HIGH (ref 70–100)
BKR POTASSIUM: 4.7 mmol/L (ref 3.3–5.1)
BKR PROTEIN TOTAL: 5.9 g/dL — ABNORMAL LOW (ref 6.6–8.7)
BKR SODIUM: 136 mmol/L (ref 136–144)

## 2020-05-24 LAB — CBC WITH AUTO DIFFERENTIAL
BKR WAM ABSOLUTE IMMATURE GRANULOCYTES: 0.2 x 1000/??L (ref 0.0–0.3)
BKR WAM ABSOLUTE LYMPHOCYTE COUNT: 1.2 x 1000/??L (ref 1.0–4.0)
BKR WAM ABSOLUTE NRBC: 0 x 1000/??L (ref 0.0–0.0)
BKR WAM ANALYZER ANC: 10.7 x 1000/??L (ref 1.0–11.0)
BKR WAM BASOPHIL ABSOLUTE COUNT: 0 x 1000/ÂµL (ref 0.0–0.0)
BKR WAM BASOPHILS: 0.1 % (ref 0.0–4.0)
BKR WAM EOSINOPHIL ABSOLUTE COUNT: 0 x 1000/ÂµL (ref 0.0–1.0)
BKR WAM EOSINOPHILS: 0 % (ref 0.0–7.0)
BKR WAM HEMATOCRIT: 30.4 % — ABNORMAL LOW (ref 37.0–52.0)
BKR WAM HEMOGLOBIN: 10 g/dL — ABNORMAL LOW (ref 12.0–18.0)
BKR WAM IMMATURE GRANULOCYTES: 1.2 % (ref 0.0–3.0)
BKR WAM LYMPHOCYTES: 9.5 % (ref 8.0–49.0)
BKR WAM MCH (PG): 31.3 pg — ABNORMAL HIGH (ref 27.0–31.0)
BKR WAM MCHC: 32.9 g/dL — ABNORMAL HIGH (ref 31.0–36.0)
BKR WAM MCV: 95.3 fL — ABNORMAL HIGH (ref 78.0–94.0)
BKR WAM MONOCYTE ABSOLUTE COUNT: 0.5 x 1000/??L (ref 0.0–2.0)
BKR WAM MONOCYTES: 3.6 % — ABNORMAL LOW (ref 4.0–15.0)
BKR WAM MPV: 9.8 fL — ABNORMAL LOW (ref 6.0–11.0)
BKR WAM NEUTROPHILS: 85.6 % — ABNORMAL HIGH (ref 37.0–84.0)
BKR WAM NUCLEATED RED BLOOD CELLS: 0 % (ref 0.0–1.0)
BKR WAM PLATELETS: 240 x1000/??L (ref 140–440)
BKR WAM RDW-CV: 15.1 % — ABNORMAL HIGH (ref 11.5–14.5)
BKR WAM RED BLOOD CELL COUNT: 3.2 M/??L — ABNORMAL LOW (ref 3.8–5.9)
BKR WAM WHITE BLOOD CELL COUNT: 12.5 x1000/ÂµL — ABNORMAL HIGH (ref 4.0–10.0)

## 2020-05-24 LAB — PT/INR AND PTT (BH GH L LMW YH)
BKR INR: 0.94 (ref 0.87–1.14)
BKR PARTIAL THROMBOPLASTIN TIME: 22.5 seconds — ABNORMAL LOW (ref 23.0–31.4)
BKR PROTHROMBIN TIME: 10.3 seconds (ref 9.6–12.3)

## 2020-05-24 LAB — PHOSPHORUS     (BH GH L LMW YH): BKR PHOSPHORUS: 3.5 mg/dL (ref 2.2–4.5)

## 2020-05-24 LAB — MAGNESIUM: BKR MAGNESIUM: 2.2 mg/dL (ref 1.7–2.4)

## 2020-05-24 MED ORDER — FENTANYL (PF) 50 MCG/ML INJECTION SOLUTION
50 mcg/mL | INTRAVENOUS | Status: DC | PRN
Start: 2020-05-24 — End: 2020-05-24

## 2020-05-24 MED ORDER — SODIUM CHLORIDE 0.9 % INTRAVENOUS SOLUTION
INTRAVENOUS | Status: DC | PRN
Start: 2020-05-24 — End: 2020-05-24
  Administered 2020-05-24: 16:00:00 via INTRAVENOUS

## 2020-05-24 MED ORDER — PROPOFOL 10 MG/ML INTRAVENOUS EMULSION
10 mg/mL | Status: CP
Start: 2020-05-24 — End: ?

## 2020-05-24 MED ORDER — PROPOFOL 10 MG/ML INTRAVENOUS EMULSION
10 mg/mL | INTRAVENOUS | Status: DC | PRN
Start: 2020-05-24 — End: 2020-05-24
  Administered 2020-05-24: 16:00:00 10 mL/h via INTRAVENOUS

## 2020-05-24 MED ORDER — LIDOCAINE (PF) 20 MG/ML (2 %) INJECTION SOLUTION
20 mg/mL (2 %) | INTRAVENOUS | Status: DC | PRN
Start: 2020-05-24 — End: 2020-05-24
  Administered 2020-05-24: 16:00:00 20 mg/mL (2 %) via INTRAVENOUS

## 2020-05-24 MED ORDER — SUGAMMADEX 100 MG/ML INTRAVENOUS SOLUTION
100 mg/mL | INTRAVENOUS | Status: DC | PRN
Start: 2020-05-24 — End: 2020-05-24
  Administered 2020-05-24: 17:00:00 100 mg/mL via INTRAVENOUS

## 2020-05-24 MED ORDER — ONDANSETRON HCL (PF) 4 MG/2 ML INJECTION SOLUTION
4 mg/2 mL | INTRAVENOUS | Status: DC | PRN
Start: 2020-05-24 — End: 2020-05-24

## 2020-05-24 MED ORDER — DEXAMETHASONE SODIUM PHOSPHATE (PF) 10 MG/ML INJECTION SOLUTION
10 mg/mL | INTRAVENOUS | Status: DC | PRN
Start: 2020-05-24 — End: 2020-05-24
  Administered 2020-05-24: 16:00:00 10 mg/mL via INTRAVENOUS

## 2020-05-24 MED ORDER — NICOTINE 7 MG/24 HR DAILY TRANSDERMAL PATCH
7 mg | Freq: Every day | TRANSDERMAL | Status: DC
Start: 2020-05-24 — End: 2020-05-26

## 2020-05-24 MED ORDER — FAMOTIDINE 20 MG TABLET
20 mg | Freq: Two times a day (BID) | ORAL | Status: DC
Start: 2020-05-24 — End: 2020-05-25
  Administered 2020-05-25: 01:00:00 20 mg via ORAL

## 2020-05-24 MED ORDER — PROPOFOL 10 MG/ML INTRAVENOUS EMULSION
10 mg/mL | INTRAVENOUS | Status: DC | PRN
Start: 2020-05-24 — End: 2020-05-24
  Administered 2020-05-24: 16:00:00 10 mg/mL via INTRAVENOUS

## 2020-05-24 MED ORDER — LORAZEPAM 0.5 MG TABLET
0.5 mg | Freq: Four times a day (QID) | ORAL | Status: DC | PRN
Start: 2020-05-24 — End: 2020-05-26
  Administered 2020-05-24: 21:00:00 0.5 mg via ORAL

## 2020-05-24 MED ORDER — PHENYLEPHRINE 10 MG/ML INJECTION SOLUTION
10 mg/mL | INTRAVENOUS | Status: DC | PRN
Start: 2020-05-24 — End: 2020-05-24
  Administered 2020-05-24 (×2): 10 mg/mL via INTRAVENOUS

## 2020-05-24 MED ORDER — LIDOCAINE (PF) 20 MG/ML (2 %) INTRAVENOUS SOLUTION
20 mg/mL (2 %) | Status: CP
Start: 2020-05-24 — End: ?

## 2020-05-24 MED ORDER — FENTANYL (PF) 50 MCG/ML INJECTION SOLUTION
50 mcg/mL | Status: CP
Start: 2020-05-24 — End: ?

## 2020-05-24 MED ORDER — NICOTINE (POLACRILEX) 2 MG GUM
2 mg | ORAL | Status: DC | PRN
Start: 2020-05-24 — End: 2020-05-26

## 2020-05-24 MED ORDER — ROCURONIUM 10 MG/ML INTRAVENOUS SOLUTION
10 mg/mL | INTRAVENOUS | Status: DC | PRN
Start: 2020-05-24 — End: 2020-05-24
  Administered 2020-05-24 (×2): 10 mg/mL via INTRAVENOUS

## 2020-05-24 MED ORDER — HYDROMORPHONE 2 MG/ML INJECTION SOLUTION
2 mg/mL | INTRAVENOUS | Status: DC | PRN
Start: 2020-05-24 — End: 2020-05-24

## 2020-05-24 MED ORDER — ONDANSETRON HCL (PF) 4 MG/2 ML INJECTION SOLUTION
4 mg/2 mL | INTRAVENOUS | Status: DC | PRN
Start: 2020-05-24 — End: 2020-05-24
  Administered 2020-05-24: 16:00:00 4 mg/2 mL via INTRAVENOUS

## 2020-05-24 MED ORDER — FENTANYL (PF) 50 MCG/ML INJECTION SOLUTION
50 mcg/mL | INTRAVENOUS | Status: DC | PRN
Start: 2020-05-24 — End: 2020-05-24
  Administered 2020-05-24 (×2): 50 mcg/mL via INTRAVENOUS

## 2020-05-24 MED ORDER — NICOTINE (POLACRILEX) 2 MG BUCCAL MINI LOZENGE
2 mg | ORAL | Status: DC | PRN
Start: 2020-05-24 — End: 2020-05-26
  Administered 2020-05-25: 01:00:00 2 mg via ORAL

## 2020-05-24 NOTE — OR Nursing
Admitted to room 3. Time out performed. Sedation and LMA given by Anesthesia/ CRNA . Airway exam performed. Lido to airways per Dr Robb Matar.  Scopes changed. EBUS guided FNA of RLL nodule, lymph nodes: 4R, 4L, and 12R. Specimens confirmed and sent to appropriate labs.  No bleeding seen in airways at end of exam. Pt tolerated procedure well. Pt transferred to RR and report given to RN.

## 2020-05-24 NOTE — Progress Notes
Urologic Surgery Progress Note    Attending Provider: Camila Li, MD    Admit Date: 05/21/2020 Hospital Day: 4  Code status: Full Code/ACLS  Allergy: Patient has no known allergies.    Background:    Paul Hunter is a 69 y.o. male w/ PMH with NSCLC on chemotherapy, CAD (s/p PCI on ASA/Plavix), sleep apnea on CPAP, nephrolithiasis, and recurrent hematuria. He is s/p cystoscopy, clot evacuation, TURBT, left retrograde pyelogram and left ureteroscopy and ureteral biopsy on 9/26 and cystoscopy, clot evacuation on 9/27    Subjective/Interim Events:     - NAEON, VSS  - Patient reports occasional bladder spasms which respond well to B&O suppisotiries  - In good spirits this morning  - Planned for EBUS    Objective:   Temp:  [97.3 ?F (36.3 ?C)-99 ?F (37.2 ?C)] 97.3 ?F (36.3 ?C)  Pulse:  [55-92] 64  Resp:  [10-20] 18  BP: (101-172)/(61-98) 172/82  SpO2:  [92 %-98 %] 92 %  Device (Oxygen Therapy): room air  O2 Flow (L/min):  [2-10] 2    I/O last 3 completed shifts:  In: 400 [I.V.:400]  Out: 3555 [Urine:3555]     CBC  Lab Results   Component Value Date    WBC 12.5 (H) 05/24/2020    HGB 10.0 (L) 05/24/2020    HCT 30.4 (L) 05/24/2020    PLT 240 05/24/2020     ELECTROLYTES  Lab Results   Component Value Date    NA 136 05/24/2020    K 4.7 05/24/2020    CL 104 05/24/2020    CO2 22 05/24/2020    CREATININE 1.54 (H) 05/24/2020    BUN 28 (H) 05/24/2020    GLU 142 (H) 05/24/2020    CALCIUM 8.6 (L) 05/24/2020    MG 2.2 05/24/2020    PHOS 3.5 05/24/2020     PT/INR/PTT  Lab Results   Component Value Date    INR 0.94 05/24/2020    PTT 22.5 (L) 05/24/2020     Microbiology:  Lab Results   Component Value Date    LABURIN No Growth 05/21/2020    LABURIN No Growth 05/14/2020    LABURIN SEE NOTE 05/03/2020    LABURIN No Growth 04/25/2020       PHYSICAL EXAM  Patient exam or treatment required medical chaperone.  The sensitive parts of the examination were performed with chaperone present: Yes; Chaperone Name, Role/Title: Dr. Judie Bonus      Gen: well appearing, resting in bed in no acute distress  CV: regular rate  Pulm: comfortable on room air   Abd: soft, rounded, non-tender to palpation  GU: CBI draining clear irrigant on slow drip    Assessment/Plan   Paul Hunter is a 69 y.o. male, Hospital Day: 4,  w/ PMH with NSCLC on chemotherapy, CAD (s/p PCI on ASA/Plavix), sleep apnea on CPAP, nephrolithiasis, and recurrent hematuria. He is s/p cystoscopy, clot evacuation, TURBT, left retrograde pyelogram and left ureteroscopy and ureteral biopsy on 9/26 and cystoscopy, clot evacuation on 9/27. CBI running on slow drip draining clear irrigant.     - CBI clamped this AM, maintain foley to gravity, will re-assess urine in several hours  - Okay to resume aspirin from a urologic standpoint, continue to hold Plavix  - Will follow up surgical pathology  - Bladder spasms: B&O suppositories, oxybutynin  - Penile pain: viscous lidocaine to urethral meatus PRN  - Aggressive bowel regimen  - Will arrange outpatient urology follow up at  the time of discharge    Signed:  Chrystine Oiler, MD    Floor Pager (for primary patients): [phone]  Consult Pager: [phone] from 6am-6pm.   Amion for nights/weekends.    Attending Addendum:

## 2020-05-24 NOTE — Progress Notes
0700-1900?Pt A&Ox4, ind OOB w/ cane. RSL port patent, c/d/i, +BBR, capped, R#18 patent, c/d/i. VSS, O2 WNL on RA, afebrile. Cardiac diet.?CBI discontinued this shift per urology, Foley putting out CYU, no c/o muscle spasms but did have penile pain, lidocaine admin w/ good effect. LBM 9/27, Miralax admin, pt also requests senna before bed. Tums and Pepcid admin for acid reflux. Nicotine patches to L arm, 0.5mg  PO Ativan admin for anxiety/agitation/smoking cessation. Pt off floor for bronch. Plan to d/c tomorrow. Safety maintained, WCTM.

## 2020-05-24 NOTE — Other
Interventional Pulmonology             Lucius Conn, MD, MEd, Director                  Bronchoscopy Report    Paul Hunter  [id number]    Date of procedure:  05/24/2020    Procedure(s) Performed:  Flexible bronchoscopy  EBUS TBNA in right lower lobe nodule and stations 4R, 4L and 12R    Indication:  Metastatic disease in need of tissue clarification    Attending:  Lucius Conn, MD, MEd    Fellow involved:   Geryl Rankins MD    Sedation:  1% lidocaine  See anesthesia notes for details regarding sedation and airway management  See nursing notes for additional details.    Procedural Details:  I discussed the procedure with the patient, including the risks and benefits.  Informed consent was obtained prior to the start of the procedure.  A time-out for patient safety was performed prior to the start of the procedure.    1% lidocaine was used to anesthetize the upper and lower airways.      A complete airway inspection was then performed and the vocal cords, trachea, right mainstem bronchus, bronchus intermedius, lobar bronchi into the right upper/middle/lower lobes, left mainstem bronchus, left upper and left lower lobar bronchi were visualized.      The convex probe endobronchial ultrasound (EBUS) scope was advanced through the vocal cords and a mediastinal and hilar inspection of the lymph nodes was performed.  After identifying the targets, the needle was passed through the EBUS scope using guidance to perform TBNA.   The needle was passed through the target 20-30 times.  Each target was sampled three separate times.    Findings:  The mediastinal and hilar nodes were small.  Biopsies were difficult as the nodes were difficult to penetrate.    The patient tolerated the procedure.  There were no immediate complications known.    Recommendations:    F/u cyto  If negative, consider [place] guided transthoracic biopsy if additional tissue is still needed      Lucius Conn, MD, MEd  Director, Interventional Pulmonology  Division of Pulmonary, Critical Care and Sleep Medicine  [facility]  [facility]    Office:  [phone]

## 2020-05-24 NOTE — Plan of Care
Plan of Care Overview/ Patient Status    NPN 19:00-07:00:A&Ox4. Standby oob, calling appropriately. Intermittent hypertension, otherwise VSS, afebrile. On room air satting WNL, dyspneic on exertion. RSL port, c/d/i, +flush/brisk BR, morning labs drawn and sent, capped. Moderate amount of loose stool x1 w/ urgency overnight. 3 way foley in place, continuous bladder irrigation in use. Titrated to slow drip, light yellow urine flowing, no blood, no clots, no c/o bladder spasm or pain from patient. No need for pain medication overnight. NPO at midnight for bronch 9/28. Nicotine patches to L arm. TUMS prn x1 to good effect for c/o mild indigestion. Patient resting between care, although awake most of night. Frequent safety checks completed, patient safety maintained throughout. 06:46: urology MD clamped CBI, will trial clamped and re evaluate in 1-2 hours per MD [place].Problem: Adult Inpatient Plan of CareGoal: Plan of Care ReviewOutcome: Interventions implemented as appropriateGoal: Patient-Specific Goal (Individualized)Outcome: Interventions implemented as appropriateGoal: Absence of Hospital-Acquired Illness or InjuryOutcome: Interventions implemented as appropriateGoal: Optimal Comfort and WellbeingOutcome: Interventions implemented as appropriateGoal: Readiness for Transition of CareOutcome: Interventions implemented as appropriate Problem: Fall Injury RiskGoal: Absence of Fall and Fall-Related InjuryOutcome: Interventions implemented as appropriate

## 2020-05-24 NOTE — Other
Endoscopy Handoff ReportProcedure performed: EBUSSedation: LMAMental status: Alert and orientedIVF received: 800ccPain: NONELast set of AV:WUJW:  [97.3 ?F (36.3 ?C)-99 ?F (37.2 ?C)] 98.8 ?F (37.1 ?C)Pulse:  [63-91] 69Resp:  [16-22] 21BP: (101-189)/(59-97) 101/60SpO2:  [90 %-100 %] 90 %Device (Oxygen Therapy): nasal cannulaO2 Flow (L/min):  [2-3] 3LDA's:Urethral Catheter 05/23/20 0733 20 (Active) Closed system maintained Yes 05/24/20 0755 Securement  secured to upper leg with adhesive device 05/24/20 0755 Bag positioned below bladder Yes 05/24/20 0755 Unobstructive flow of urine Yes 05/24/20 0755 Indicators present  to keep catheter in place? Acute urinary retention/bladder outlet obstruction 05/24/20 0755 Daily Review of Necessity  completed 05/24/20 0755 Tolerance no signs/symptoms of discomfort 05/24/20 0755 Urine Output (mL) 600 05/24/20 0900   Implanted Port - single lumen port 02/04/20 0810 (Active) SiteCare/Dressing Status/Securement dressing dry and intact 05/24/20 0755 Date dressing changed 05/21/20 05/24/20 0755 Next date - dressing change 05/28/20 05/24/20 0400 Lumen 1 Access Date 05/28/20 05/22/20 0906 Lumen 1 Access Needle 20 gauge;3/4 in length 05/24/20 0755 Lumen 1 Site Signs asymptomatic with no redness, no swelling , no drainage 05/24/20 0755 Lumen 1 Infiltration/Extravasation 0-->no symptoms 05/24/20 0755 Lumen 1 Patency/Care flushed w/o difficulty;blood return present 05/24/20 0755 Lumen 1 Use Capped 05/24/20 0755 Lumen 1 Date Connector/Cap Applied 05/21/20 05/24/20 0755 Lumen 1 Next Date- Connector/Cap Change  05/25/20 05/24/20 0755 Lumen 1 Needle Removal Indication discharged 05/12/20 1450 Lumen 1 Needle Removal Date 05/12/20 05/12/20 1450 Lumen 1 Line Interventions blood specimen obtained and sent to lab 05/24/20 0400 Indication/Daily Review of Necessity Poor or limited access/frequent lab draws 05/24/20 0755 Please see provation report (located under procedures tab under chart review) and/or proceduralist's note for additional details and recommendations.PACU RN: Laurena Spies	Contact #: [phone] Endoscopy Handoff ReportProcedure performed: EBUSSedation: LMAMental status:Alert and orientedIVF received: 800 ccPain: NONELast set of GN:FAOZ:  [97.3 ?F (36.3 ?C)-99 ?F (37.2 ?C)] 98.8 ?F (37.1 ?C)Pulse:  [63-91] 69Resp:  [16-22] 21BP: (101-189)/(59-97) 101/60SpO2:  [90 %-100 %] 90 %Device (Oxygen Therapy): nasal cannulaO2 Flow (L/min):  [2-3] 3LDA's:Urethral Catheter 05/23/20 0733 20 (Active) Closed system maintained Yes 05/24/20 0755 Securement  secured to upper leg with adhesive device 05/24/20 0755 Bag positioned below bladder Yes 05/24/20 0755 Unobstructive flow of urine Yes 05/24/20 0755 Indicators present  to keep catheter in place? Acute urinary retention/bladder outlet obstruction 05/24/20 0755 Daily Review of Necessity ** completed 05/24/20 0755 Tolerance no signs/symptoms of discomfort 05/24/20 0755 Urine Output (mL) 600 05/24/20 0900   Implanted Port - single lumen port 02/04/20 0810 (Active) SiteCare/Dressing Status/Securement dressing dry and intact 05/24/20 0755 Date dressing changed 05/21/20 05/24/20 0755 Next date - dressing change 05/28/20 05/24/20 0400 Lumen 1 Access Date 05/28/20 05/22/20 0906 Lumen 1 Access Needle 20 gauge;3/4 in length 05/24/20 0755 Lumen 1 Site Signs asymptomatic with no redness, no swelling , no drainage 05/24/20 0755 Lumen 1 Infiltration/Extravasation 0-->no symptoms 05/24/20 0755 Lumen 1 Patency/Care flushed w/o difficulty;blood return present 05/24/20 0755 Lumen 1 Use Capped 05/24/20 0755 Lumen 1 Date Connector/Cap Applied 05/21/20 05/24/20 0755 Lumen 1 Next Date- Connector/Cap Change  05/25/20 05/24/20 0755 Lumen 1 Needle Removal Indication discharged 05/12/20 1450 Lumen 1 Needle Removal Date 05/12/20 05/12/20 1450 Lumen 1 Line Interventions blood specimen obtained and sent to lab 05/24/20 0400 Indication/Daily Review of Necessity Poor or limited access/frequent lab draws 05/24/20 0755 	Please see provation report (located under procedures tab under chart review) and/or proceduralist's note for additional details and recommendations.We instituted incentive spirometer to help is oxygenation   Report called to NP12 nurse  PACU RN: Alona Bene  Paul Hunter	Contact #: [phone]

## 2020-05-24 NOTE — Other
[facility]     Interventional Pulmonology Initial Consultation    Attending Provider: Camila Li, MD  Admit Date: 05/21/2020  Length of stay: 2 Day(s)    Subjective:     Reason for Consultation: EBUS    HPI:   THis is a 69 yo gentleman with a history of (?presumed?) NSCLC (currently on carbo/taxol/pembro), CAD and OSA that presented with urinary retention on 9/25. The patient has had hematuira and retention recently as well and has received irrigation in the past. Given ongoing hematuria the pt underwent cystoscopy 9/26 where he was found to have a new bladder tumor that was biopsied (results still pending). Additionally [place] imaging shows innumerable pulmonary metastases with mediastinal adenopathy.    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 He moved to [place] to receive the remainder of his care. Given the patients new diagnosis of a bladder mass, there is a question of whether his metastatic SCC seen on EBUS (in [facility]) was actually metastatic lung or possibly metastatic from his (newly found) bladder CA (quick review of literature shows that lung mets to the bladder are exceedingly rare and that the bladder mass is unlikely to represent metastatic lung disease). Pt denies cough.    All other systems reviewed and were negative    Medications:     Current Facility-Administered Medications   Medication   ? belladonna alkaloids-opium (B&O SUPPRETTES) 16.2-30 mg suppository 1 suppository   ? bisacodyL (DULCOLAX) suppository 10 mg   ? escitalopram oxalate (LEXAPRO) tablet 10 mg   ? [Held by provider] furosemide (LASIX) tablet 40 mg   ? gabapentin (NEURONTIN) capsule 600 mg   ? lidocaine (XYLOCAINE) 2 % viscous solution   ? lidocaine uro-jet (XYLOCAINE) 2 % jelly 11 mL   ? metoprolol succinate (TOPROL-XL) 24 hr tablet 12.5 mg   ? nicotine (NICODERM CQ) transdermal patch 24 hr 21 mg   ? oxybutynin XL (DITROPAN-XL) 24 hr tablet 5 mg   ? oxyCODONE (ROXICODONE) Immediate Release tablet 5 mg   ? polyethylene glycol (MIRALAX) packet 17 g   ? predniSONE (DELTASONE) tablet 100 mg   ? [START ON 05/26/2020] predniSONE (DELTASONE) tablet 90 mg   ? rosuvastatin (CRESTOR) tablet 40 mg   ? senna-docusate (SENNA-PLUS) 8.6-50 mg per tablet 1 tablet   ? sodium chloride 0.9 % flush 3 mL   ? sodium chloride 0.9 % flush 3 mL       Objective:     Vitals:  Temp:  [97.2 ?F (36.2 ?C)-98.5 ?F (36.9 ?C)] 98.4 ?F (36.9 ?C)  Pulse:  [55-92] 92  Resp:  [10-20] 20  BP: (101-206)/(61-115) 101/61  SpO2:  [93 %-100 %] 94 %  Device (Oxygen Therapy): room air  O2 Flow (L/min):  [2-10] 2    Intake/Output Summary (Last 24 hours) at 05/23/2020 1410  Last data filed at 05/23/2020 1223  Gross per 24 hour   Intake 1050 ml   Output 5475 ml   Net -4425 ml         Physical Exam:  General: NAD  CV: RRR  Pulm: CTAB  Abd: soft, ntnd  Ext: wwp  Neuro: A&Ox3    Labs:  Lab Results   Component Value Date    WBC 12.3 (H) 05/23/2020    HGB 10.2 (L) 05/23/2020  HCT 31.1 (L) 05/23/2020    MCV 96.1 (H) 05/23/2020    PLT 207 05/23/2020     Lab Results   Component Value Date    INR 0.90 05/21/2020       SARS-CoV-2 RNA (COVID-19)   Date/Time Value Ref Range Status   05/21/2020 07:18 PM Negative Negative Final     Comment:     SARS-CoV-2 target nucleic acids not detected.    Fact Sheet for Healthcare Providers:  https://pope.com/     Fact Sheet for Patients:  BoilerBrush.com.cy    Test performed using a GeneXpert real-time RT-PCR assay.    Test performance has not been evaluated in asymptomatic patients. Test ordering and result interpretation is at the discretion of the ordering provider.    Test performed at:    [facility]  [address]  [place], [place] [address]  Phone: [phone]  Director: Quinn Axe, MD  CLIA#: [id number]         Diagnostics:   CHest      IMPRESSION:  ?  1. Interval progression of innumerable lung metastases.  ?  2. Mixed interval change with respect to adenopathy, overall without significant change.    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  IP has been consulted for EBUS.    Overall concern is that the metastatic squamous cell cancer is not in fact of lung origin but is of bladder origin.  Per the primary team, the reports or tissue was sent from [facility] however this was not diagnostic in off that we can continue to feel confident about the lung adenopathy and nodules.  As such, will proceed with bronchoscopy and EBUS to sample lymph nodes that may determine what the primary etiology of lung nodules is.      - patient has been NPO since midnight,  COVID up-to-date  - will proceed with EBUS     This management plan should be considered preliminary pending attestation by the Interventional Pulmonology attending    Thank you for including [place] in your patients care.     Geryl Rankins, MD   Interventional Pulmonary Fellow  Please call [phone] with any questions or concerns.  May 23, 2020

## 2020-05-24 NOTE — Anesthesia Post-Procedure Evaluation
Anesthesia Post-op NotePatient: Elmon Else AstorinoProcedure(s):  Procedure(s) (LRB):EBUS (N/A) Patient location: PACULast Vitals:  I have noted the vital signs as listed in the nursing notes.Mental status recovered: patient participates in evaluation: YesVital signs reviewed: YesRespiratory function stable:YesAirway is patent: YesCardiovascular function and hydration status stable: YesPain control satisfactory: YesNausea and vomiting control satisfactory:Yes

## 2020-05-24 NOTE — Anesthesia Pre-Procedure Evaluation
This is a 69 y.o. male scheduled for EBUS (N/A ).Review of Systems/ Medical HistoryPatient summary, nursing notes, EKG/Cardiac Studies , Labs, pre-procedure vitals, height, weight and NPO status reviewed.No previous anesthesia concernsAnesthesia Evaluation:   History of anesthetic complications: difficult intubation.  Estimated body mass index is 41.56 kg/m? as calculated from the following:  Height as of this encounter: 6' (1.829 m).  Weight as of this encounter: 139 kg. CC/HPI: 69 y.o. male with a history of NSCLC (on carbo/taxol/pembro C5D1 03/24/20), CAD s/p PCI with 3 stents placed in 2014 (on ASA; plavix on hold d/t hematuria with clots), concern for worsening cardiomyopathy on recent PET stress test (ischemic vs ICI-induced), OSA on CPAP, and hematuria with known bladder blood clot who presents with urinary retention and pain s/p cystoscopy with  Clot evacuation. Planned for ebus for lung nodule. Past Surgical History:  CAROTID STENTCHOLECYSTECTOMYKNEE SURGERYLAMINECTOMYLUMBAR DISC [facility] 9/26/21ETT: 7.5; Insertion Attempts: 1;  Glidescope; Blade Size: 4;  VC Grade: I; 	Cardiovascular:Patient has a history of: hypertension.  Dyspnea. -Exercise tolerance: >4 METS -Coronary Artery Disease: CAD and cardiac stents (2014) -Vascular Disease:  Negative   -Other Cardiovascular: cardiomyopathy.    TTE 05/11/20 * Normal left ventricular size, systolic function and wall motion. Moderate concentric left ventricular hypertrophy.  LVEF calculated by biplane Simpson's was 68%.  Abnormal tissue Doppler suggestive of abnormal diastolic function.  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  Previous study ascending aorta size was 4.4cm* Inferior vena cava was not well visualized.* Compared with the prior study, dated 02/08/2020, there are changes noted.  Wall motion in inferior and inferoseptal walls noted on current study, though compared to prior image, no significant change in wall motion abnormality.PET myocardial perfusion 05/06/20 * Highly abnormal PET myocardial perfusion study following pharmacologic vasodilation with regadenoson and at rest with high risk features.  Perfusion imaging was abnormal showing a large sized, moderate to severe intensity, reversible perfusion defect in the basal to apical inferior and inferolateral walls consistent with ischemia; and a medium to large sized, moderate intensity, reversible perfusion defect in the mid to apical anterior and anterolateral walls consistent with ischemia.  There is visual transient ischemic dilation  There was transient post-stress LV dysfunction, resting ejection fraction 40% and stress ejection fraction 36% with abnormal regional wall motion showing global hypokinesis and inferior akinesis.  The left ventricle was severely enlarged in size.  Stress electrocardiogram was abnormal due to ischemic ECG changes.  [place] performed for attenuation correction showed coronary artery calcification in the left main artery (moderate), left anterior descending artery (severe), diagonal artery (severe), left circumflex artery (mild), and right coronary artery (mild).    Non-cardiac findings of the [place] are described in a separate report from Radiology.  No prior study available for comparison.* Global myocardial blood flow was reduced at 0.78 ml/g/min during stress and normal at 0.68 ml/g/min during rest.  Global myocardial flow reserve was reduced at 1.15.* These findings highly suspicious for multi-vessel CAD. Respiratory: Other airway infections noted: (Squamous cell carcinoma of lung, stage IV ).-Obstructive Sleep apnea:   yes, CPAP use.  -Lung Disorders: Patient has pulmonary neoplasm (Squamous cell carcinoma of lung, stage IV ).  -COPD:  yes-Comments: [place] chest 9/21/211. Interval progression of innumerable lung metastases. 2. Mixed interval change with respect to adenopathy, overall without significant change. -  Nicotine Dependence: yes, smoking HEENT: Negative.Spine/Spinal Cord Disorders:  History of spinal stenosis.Gastrointestinal/Genitourinary: -Gastrointestinal Disorders:  Patient has GERD.-Nutritional Disorders: Patient has has increased body weight- obesity.-Lower Genitourinary Disorders:  He has BPH.-Comments: Urinary retention with urethral catheter. Hematological/Lymphatic: -Anemia: Patient has anemia.  -Coagulopathy:  Patient does not have abnormal PT/PTT function. He has no thrombocytopenia.Endocrine/Metabolic: -Diabetes mellitus:  Patient has diabetes mellitus.-Metabolic Disorders:  Patient has hyperlipidemia.Behavioral/Psychiatric & Syndromes:  Patient has anxiety.Additional Findings: Labs 9/27H/h 10.2/31.1plt 207K 4.3T&S 9/25 O positive covid negative 9/25Vitals:  05/24/20 0740 BP: (!) 189/97 Pulse: 65 Resp: 20 Temp: 36.8 ?C SpO2: 95% No Known AllergiesPast Medical History: Diagnosis Date ? Anxiety  ? CAD (coronary artery disease)  ? Diabetes mellitus (HC Code)  ? Difficult intubation 05/22/2020  Patient with significant soft tissue in oropharynx. Glidescope intubation revealed anterior cords with floppy soft tissue obstruction. Patient will require video laryngoscopy for airway management going forward. ? GERD (gastroesophageal reflux disease)  ? Hyperlipidemia  ? Hypertension  ? Hypertrophy of prostate without urinary obstruction and other lower urinary tract symptoms (LUTS)  ? Other testicular hypofunction  ? Spinal stenosis 09/28/2018 ? Tobacco abuse  ? Vitamin D deficiency  Past Surgical History: Procedure Laterality Date ? CAROTID STENT  08/28/2003 ? CHOLECYSTECTOMY  11/07/2016 ? KNEE SURGERY    torn meniscus ? LAMINECTOMY  10/07/2008 ? LUMBAR DISC SURGERY    herniated disc No current facility-administered medications on file prior to encounter. Current Outpatient Medications on File Prior to Encounter Medication Sig Dispense Refill ? aspirin 81 MG EC tablet Take 81 mg by mouth daily.   ? atorvastatin (LIPITOR) 80 mg tablet Take 1 tablet (80 mg total) by mouth nightly. 30 tablet 6 ? carboxymethylcellulose (REFRESH PLUS) 0.5 % ophthalmic solution in dropperette Place 1 drop into both eyes 3 (three) times daily as needed.   ? escitalopram oxalate (LEXAPRO) 10 mg tablet Take 1 tablet (10 mg total) by mouth nightly. 90 tablet 3 ? furosemide (LASIX) 40 mg tablet Take 1 tablet (40 mg total) by mouth daily. 30 tablet 2 ? gabapentin (NEURONTIN) 300 mg capsule Take 2 capsules (600 mg total) by mouth at bedtime. 60 capsule 0 ? metoprolol succinate XL (TOPROL-XL) 25 mg 24 hr tablet Take 0.5 tablets (12.5 mg total) by mouth daily. Take with or immediately following a meal. 30 tablet 0 ? multivitamin capsule Take 1 capsule by mouth daily.    ? oxybutynin XL (DITROPAN-XL) 5 mg 24 hr tablet Take 1 tablet (5 mg total) by mouth daily. 90 tablet 0 ? oxyCODONE (ROXICODONE) 5 mg Immediate Release tablet Take 1 tablet (5 mg total) by mouth every 4 (four) hours as needed for up to 5 doses. 5 tablet 0 ? predniSONE (DELTASONE) 10 mg tablet Take 13 tablets (130 mg total) by mouth daily for 7 days, THEN 12 tablets (120 mg total) daily for 7 days, THEN 11 tablets (110 mg total) daily for 7 days, THEN 10 tablets (100 mg total) daily for 7 days, THEN 9 tablets (90 mg total) daily for 7 days, THEN 8 tablets (80 mg total) daily for 7 days, THEN 7 tablets (70 mg total) daily for 7 days, THEN 6 tablets (60 mg total) daily for 7 days, THEN 5 tablets (50 mg total) daily for 7 days, THEN 4 tablets (40 mg total) daily for 7 days. Take with food.. 595 tablet 0 ? [START ON 06/28/2020] predniSONE (DELTASONE) 10 mg tablet Take 3 tablets (30 mg total) by mouth daily for 7 days, THEN 2 tablets (  20 mg total) daily for 7 days, THEN 1 tablet (10 mg total) daily for 7 days, THEN 0.5 tablets (5 mg total) daily for 7 days, THEN 0.5 tablets (5 mg total) daily for 7 days. Take with food.. 49 tablet 0 ? fexofenadine (ALLEGRA) 180 mg tablet Take 180 mg by mouth daily as needed.    ? [DISCONTINUED] albuterol sulfate 90 mcg/actuation HFA aerosol inhaler    Lab Results Component Value Date  WBC 12.5 (H) 05/24/2020  HGB 10.0 (L) 05/24/2020  HCT 30.4 (L) 05/24/2020  MCV 95.3 (H) 05/24/2020  PLT 240 05/24/2020   Chemistry  Lab Results Component Value Date  NA 136 05/24/2020  K 4.7 05/24/2020  CL 104 05/24/2020  CO2 22 05/24/2020  BUN 28 (H) 05/24/2020  CREATININE 1.54 (H) 05/24/2020  GLU 142 (H) 05/24/2020  Lab Results Component Value Date  CALCIUM 8.6 (L) 05/24/2020  ALKPHOS 70 05/24/2020  AST 24 05/24/2020  ALT 23 05/24/2020  BILITOT 0.2 05/24/2020  Physical ExamCardiovascular:    normal exam  Rhythm: regularHeart Sounds: S1 present and S2 present.Pulmonary:  decreased breath sounds and wheezing (scant bilateral wheezes L worse than R, not diffuse, no respiratory distress)Airway:  Mallampati: IIITM distance: >3 FBNeck ROM: fullDental:  normal exam  Anesthesia PlanASA 3 The primary anesthesia plan is  general LMA. Perioperative Code Status confirmed: It is my understanding that the patient is currently designated as 'Full Code' and will remain so throughout the perioperative period.Anesthesia informed consent obtained. Consent obtained from: patientUse of blood products: consented  The post operative pain plan is per surgeon management.Plan discussed with CRNA.Anesthesiologist's Pre Op NoteI personally evaluated and examined the patient prior to the intra-operative phase of care.

## 2020-05-24 NOTE — Progress Notes
[facility] Inpatient Progress Note 9/27/2021Attending: Camila Li, MDHospital Day: 2PatientKelvyn Schunk Hunter  DOB: May 26, 1952Medical Record Number: [id number]  Admission Date: 9/25/2021Subjective: Interval History: Chart and nursing notes reviewed. Overnight, pt noted to have frank blood in foley w/ large clots obstructing flow s/p bedside irrigation w/ no response so was taken to the OR early this morning for emergent cystoscopy, clot evacuation and fulguration with Urology and found large organized clot within the bladder, arterial bleeding immediately lateral to the left ureteral orifice, s/p successful cauterization w/ stable H/H. This morning, patient seen lying in bed in NAD. He reports some penile burning but has had good response to oxycodone and discussed lidocaine jelly PRN. Urine is pink tinged to concentrated yellow with currently good output and no clots identified.  He denies any other complaints. We discussed plans for IP pulm consult for potential biopsy of of mediastinal node per outpatient Oncologist Dr. Rolanda Lundborg squamous cell in lung is a primary neoplasm vs metastatic disease from a urothelial primary).Outpatient Oncologist?	Name of OP oncologist: Dr. Layla Barter?	Discussed Pertinent Updates with OP oncologist?: Yes (updated by attending 9/27)Review of Systems: Review of Systems Respiratory: Negative.  Cardiovascular: Negative.  Gastrointestinal: Negative.  Genitourinary: Positive for hematuria.      Penile burning Neurological: Negative.  Medications / Allergies: Inpatient MedicationsCurrent Facility-Administered Medications Medication Dose Route Frequency Provider Last Rate Last Admin ? escitalopram oxalate (LEXAPRO) tablet 10 mg  10 mg Oral Nightly Heidi Dach, MD   10 mg at 05/22/20 2002 ? [Held by provider] furosemide (LASIX) tablet 40 mg  40 mg Oral Daily Heidi Dach, MD     ? gabapentin (NEURONTIN) capsule 600 mg  600 mg Oral Nightly Heidi Dach, MD   600 mg at 05/22/20 2002 ? lidocaine (XYLOCAINE) 2 % viscous solution          ? metoprolol succinate (TOPROL-XL) 24 hr tablet 12.5 mg  12.5 mg Oral Daily Heidi Dach, MD   12.5 mg at 05/23/20 1914 ? nicotine (NICODERM CQ) transdermal patch 24 hr 21 mg  21 mg Transdermal Daily Joyice Faster, APRN   21 mg at 05/23/20 1123 ? oxybutynin XL (DITROPAN-XL) 24 hr tablet 5 mg  5 mg Oral Daily Heidi Dach, MD   5 mg at 05/23/20 0945 ? predniSONE (DELTASONE) tablet 100 mg  100 mg Oral Daily Heidi Dach, MD   100 mg at 05/23/20 0944 ? [START ON 05/26/2020] predniSONE (DELTASONE) tablet 90 mg  90 mg Oral Daily Heidi Dach, MD     ? rosuvastatin (CRESTOR) tablet 40 mg  40 mg Oral Daily Heidi Dach, MD   40 mg at 05/23/20 7829 ? senna-docusate (SENNA-PLUS) 8.6-50 mg per tablet 1 tablet  1 tablet Oral Nightly Luetta Nutting, MD   1 tablet at 05/22/20 2002 ? sodium chloride 0.9 % flush 3 mL  3 mL IV Push Q8H Heidi Dach, MD   3 mL at 05/22/20 0509 ? belladonna alkaloids-opium  1 suppository Rectal BID PRN ? bisacodyL  10 mg Rectal DAILY PRN ? lidocaine uro-jet  11 mL INTRA-URETHRAL BID PRN ? oxyCODONE  5 mg Oral Q6H PRN ? polyethylene glycol  17 g Oral DAILY PRN ? sodium chloride  3 mL IV Push PRN for Line Care Allergies: Patient has no known allergies. Objective Objective: Vitals:Temp:  [97.2 ?F (36.2 ?C)-98.5 ?F (36.9 ?C)] 98.4 ?F (36.9 ?C)Pulse:  [55-92] 92Resp:  [10-20] 20BP: (101-206)/(61-115) 101/61SpO2:  [93 %-100 %] 94 %Marland Kitchen)  139 kg Body mass index is 41.56 kg/m?Marland KitchenPhysical ExamConstitutional:     General: He is not in acute distress.HENT:    Head: Normocephalic and atraumatic. Cardiovascular:    Rate and Rhythm: Normal rate and regular rhythm.    Pulses: Normal pulses.    Heart sounds: Normal heart sounds. Pulmonary: Effort: Pulmonary effort is normal.    Breath sounds: Normal breath sounds. Abdominal:    General: Bowel sounds are normal.    Palpations: Abdomen is soft. Genitourinary:   Comments: Foley catheter in place w/ CBI draining pink tinged urine, no clots noted.Musculoskeletal:       General: No swelling or tenderness. Skin:   General: Skin is warm and dry. Neurological:    General: No focal deficit present.    Mental Status: He is alert. Intake/Output Summary (Last 24 hours) at 05/23/2020 1357Last data filed at 05/23/2020 1223Gross per 24 hour Intake 1050 ml Output 5475 ml Net -4425 ml Laboratory / Imaging: Recent Labs Lab 09/25/211835 09/26/210509 09/27/210246 09/27/210716 WBC 11.5* 10.6* 12.3*  --  HGB 11.5* 11.2* 10.6* 10.2* HCT 34.8* 33.7* 31.7* 31.1* PLT 200 191 207  --   Recent Labs Lab 09/25/211835 09/26/210509 09/27/210246 NEUTROPHILS 92.3* 81.2 81.0  Recent Labs Lab 09/25/211835 09/26/210509 09/26/210509 09/27/210246 09/27/210756 NA 139 141  --  139  --  K 3.9 4.2  --  4.3  --  CL 102 106  --  105  --  CO2 24 25  --  26  --  BUN 26* 27*  --  25*  --  CREATININE 1.43* 1.42*  --  1.37*  --  GLU 147* 131*   < > 132* 105* ANIONGAP 13 10  --  8  --   < > = values in this interval not displayed.  Recent Labs Lab 09/25/211835 09/26/210509 09/27/210246 CALCIUM 8.3* 8.4* 8.1*  Recent Labs Lab 09/25/211835 09/27/210246 ALT 44 34 AST 18 16 ALKPHOS 68 66 BILITOT 0.2 0.2 PROT 5.8* 5.2* ALBUMIN 3.2* 2.9*  Recent Labs Lab 09/25/211922 PTT 23.1 LABPROT 9.9 INR 0.90  Microbiology:Recent Results (from the past 168 hour(s)) Urine microscopic     ([facility])  Collection Time: 05/21/20  7:18 PM Result Value Ref Range  Epithelial Cells Few None-Few /LPF  Hyaline Casts, UA 3 0 - 3 /LPF  Bacteria, UA None None-Few /HPF  WBC/HPF, UA 2 0 - 5 /HPF RBC/HPF, UA 731 (H) 0 - 2 /HPF Urinalysis-macroscopic w/reflex microscopic  Collection Time: 05/21/20  7:18 PM Result Value Ref Range  Clarity, UA Cloudy (A) Clear  Color, UA Brown (A) Yellow  Specific Gravity, UA 1.012 1.005 - 1.030  pH, UA 6.5 5.5 - 7.5  Protein, UA 1+ (A) Negative-Trace  Glucose, UA Negative Negative  Ketones, UA Negative Negative  Blood, UA 3+ (A) Negative  Bilirubin, UA Negative Negative  Leukocytes, UA Negative Negative  Nitrite, UA Negative Negative  Urobilinogen, UA <2.0 <=2.0 EU/dL Urine microscopic     ([facility])  Collection Time: 05/19/20  1:22 PM Result Value Ref Range  Manual Microscopic Performed   Epithelial Cells None None-Few /LPF  Hyaline Casts, UA 0-3 0 - 3 /LPF  WBC/HPF 3-5 0 - 5 /HPF  RBC/HPF Many (A) 0 - 2 /HPF  Bacteria, UA None None-Few /HPF Imaging Last 24 Hours: NR FL Less Than 1 Hour ExamResult Date: 9/26/2021DISCLAIMER  This procedure captures images only.  There is no report. Assessment: 69yo male with PMH of CAD (s/p MI x2, s/p PCI x4),  HFrEF, emphysema, 75 pack year smoking history, OSA (on CPAP), hematuria (intermittent for ~96yr w/ neg work-up, last in 2019) spinal stenosis, depression, and stage IV NSCLC (initially presented w/ DOE 06/2019 w/ care initially in [place] including PET 11/04/19 w/ hyperavid lung nodules w/ diffuse adenopathy in neck/chest and upper abdomen, s/p infraclavicular LN bx 11/18/19 w/ focal atypical cells c/f carcinoma, s/p EBUS w/ bx of station 7 LN c/w metastatic SCC w/ insufficient material for molecular testing or PD-L1 staining, started Carbo/Taxol/Pembro 11/28/19 in [place] and then transitioned care to [facility], most recently s/p C4 03/10/20 with plan to transition to pembro maintenance given disease response but therapy deferred given ongoing cardiac work-up) 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 (mix of ischemic and nonischemic DGE w/ LVEF 44% +WMA; started on pred 1mg /kg/day f/b weekly taper, w/ stress PET 05/06/20 that was high risk w/ large reversible perfusion defects and LVEF drop from 40-36% on stress and thus cautiously started on plavix) and intermittent gross hematuria (evaluated by Urology as outpatient w/ plan for cystoscopy) with multiple recent admits: admitted 9/13-9/16 after noted wt gain, pitting edema and worsening DOE c/f acute decompensated HF w/ TTE c/w diastolic dysfunction exacerbation; he was diuresed, started on metop and discharged on lasix 40mg  PO QD and metop succinate 12.5mg  daily w/ plan for outpatient Cystoscopy for ongoing hematuria. ECC visit 9/18 w/ recurrent hematuria and urinary retention; he was seen by Urology, manually irrigated w/ clearance of urine, started on pyridium and sent home w/ foley catheter. Again admitted 9/23-9/24 on Urology service after p/w urinary retention, penile pain and poor foley drainage; he underwent bedside irrigation and given urine was clear red without clots and Hgb and Cr at baseline, no operative intervention was recommended but planned to have cystoscopy as outpatient and Cardiology recommended holding plavix until outpatient f/u 06/03/20. He now presents for urinary retention and pain. In the ECC, foley was flushed w/ evacuation of clots and improvement in symptoms, Urology was consulted, he was given CTX and admitted for further management. 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 Afebrile, HD stable. Plan: Urology: hematuria w/ new papillary tumor involving L trigone and left lateral bladder wall --Urology consulted, appreciative involvement--S/p cystoscopy 9/26 with finding of tumor as above, f/u path--c/b frank blood in foley w/ large clots obstructing flow o/n 9/26-9/27 s/p bedside irrigation w/ no response so was taken to the OR early this morning for emergent cystoscopy, clot evacuation and fulguration with Urology and found large organized clot within the bladder, arterial bleeding immediately lateral to the left ureteral orifice, s/p successful cauterization. H/H remains stable.--Continue CBI per Urology as follows:	-Titrate drip to a clear, translucent, pink urine output????	?- Recommend using 2 L saline bags, have one running at all time?????	- Match CBI input and output ????	?- Stop CBI inflow if: patient develops suprapubic pain or if outflow becomes obstructed. In which case manually irrigate.?????	- Perform manual irrigation through main foley port (disconnect foley from bag), by injecting 120cc of sterile  H2O using the 60cc catheter tipped syringe. Aspirate back 60 ml, instill 60 ml, aspirate back 60 ml, etc. Repeat as necessary until no clots and translucent pink or clear. ????	?- Would have nursing perform manual irrigation every shift and PRN for clots, if foley stops draining, or if patient experiences suprapubic pain?????	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 Next f/u scheduled 10/8 and repeat cMRI for 10/28. --on recent admit cardio onc  felt clinical findings/sx and echo were consistent with likely diastolic dysfunction exacerbation, could be from ICI myocarditis and/or ischemic disease/microvascular dysfunction, likely both is possible given ICI can increase risk of atherosclerosis progression and plaque rupture--Started on prednisone ~8/25 w/ 1mg /kg (130mg ) and tapering by 10mg /wk taper, continue home dose 100mg  with transition to 90mg  daily as scheduled starting 9/30.--Will start Bactrim for PCP ppx given prolonged steroid use--Recently started on plavix for DAPT during admission 9/13-9/16 but has been c/b re-current hematuria. --Continue to HOLD plavix for now given bleeding but resume ASA (per d/w cardiology and okay per Urology)--Per d/w Cards fellow today, again ideally he would be on DAPT but agree to continue to hold in the setting of recurrent bleeding and would continue w/ ASA monotherapy and continue to discuss future plavix with outpatient cardiology once bleeding under control with ongoing discussion of LHC (see cards note from 9/24)--Will resume home Lasix--Continue Metoprolol 12.5mg  daily--Continue Statin--Monitor HR and BP closely, bp's improved today.PULM: Hx OSA, 75 pack year smoking hx and stage IV NSCLC --Primary management per Dr. 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 NPO after midnight. Okay per pulm to resume ASA as above.# chronic medical problemPain: gabapentin 600 qhsMood: lexaproCurrent tobacco use: nicotine patch (dose increased today per pt request)CKD: cr 1.4 at baseline. Etiology unclear suspect HTN induced, now improving.Hospital Care:?	Nutrition: Cardiac diet, NPO after midnight?	Prophylaxis: On hold given hematuria?	 Discharge Planning Needs: TBD?	Code Status: FULL CODEI have personally discussed the plan of care with the patient. YesI have personally updated family regarding the patient?s admission. YesI have reviewed the most pertinent aspects of the current plan of care with the bedside nurse: yes Electronically Signed:Ermal Brzozowski Aline August, APRN 05/23/2020 1:57 PM

## 2020-05-24 NOTE — Other
Post Anesthesia Transfer of Care NotePatient: Paul Else AstorinoProcedure(s) Performed: Procedure(s) (LRB):EBUS (N/A) Patient location: PACU Last Vitals: Vitals Value Taken Time BP 161/92 05/24/20 1251 Temp 37.1 ?C 05/24/20 1251 Pulse 84 05/24/20 1254 Resp 17 05/24/20 1254 SpO2 99 % 05/24/20 1254 Vitals shown include unvalidated device data.Level of consciousness: awakeTransport Vital Signs:  Stable since the last set of recorded intra-operative vital signsComplications: noneIntra-operative Intake & Output and Antibiotics as per Anesthesia record and discussed with the RN.

## 2020-05-24 NOTE — Other
Operative Diagnosis:Pre-op:   * No pre-op diagnosis entered * Patient Coded Diagnosis   Pre-op diagnosis: Lung nodule  Post-op diagnosis: Lung nodule  Patient Diagnosis   None    Post-op diagnosis:   * Lung nodule [A54.1]Operative Procedure(s) :Procedure(s) (LRB):EBUS (N/A)Post-op Procedure & Diagnosis ConfirmationPost-op Diagnosis: Post-op Diagnosis updated (see notes)     - Lung nodule (R91.1)Post-op Procedure: Post-op Procedure updated (see notes)     - EBUS (N/A )

## 2020-05-24 NOTE — Progress Notes
[facility] Oncology Inpatient Progress Note 9/28/2021Attending: Camila Li, MDHospital Day: 3PatientReg Paul Hunter  DOB: [birth year]Medical Record Number: [id number]  Admission Date: 9/25/2021Subjective: Chief Complaint: hematuriaInterval History:No acute events overnight. CBI clamped by Urology this morning at 6:38am. Patient reports feeling well today; he's very happy that his urine is now clear yellow and it began clearing yesterday afternoon after the cystoscopy. He denies any pain. We discussed rationale for bronchoscopy and he's in agreement; he's got lots of questions about hypothetical situations but doesn't want to discuss the what ifs now and wants to wait for further information. Denies any further complaints.This afternoon, patient was feeling so well that he wanted to have a smoke so was dressed and headed outside to do so, but was ultimately convinced by the nurse to stay on the floor. I discussed with him and he's willing to try additional medication, such as ativan for calming effect that he normally would get from smoking. Outpatient Oncologist?	Name of OP oncologist: Dr Layla Barter?	Discussed Pertinent Updates with OP oncologist?: Not todayReview of Systems: ROS as aboveMedications / Allergies: Inpatient MedicationsCurrent Facility-Administered Medications Medication Dose Route Frequency Provider Last Rate Last Admin ? aspirin EC delayed release tablet 81 mg  81 mg Oral Daily Joyice Faster, APRN     ? escitalopram oxalate (LEXAPRO) tablet 10 mg  10 mg Oral Nightly Heidi Dach, MD   10 mg at 05/23/20 2058 ? famotidine (PF) (PEPCID) 20 mg in water for injection, sterile 5 mL Injection  20 mg IV Push Daily Joyice Faster, APRN   20 mg at 05/24/20 8119 ? furosemide (LASIX) tablet 40 mg  40 mg Oral Daily Heidi Dach, MD   40 mg at 05/24/20 0855 ? gabapentin (NEURONTIN) capsule 600 mg  600 mg Oral Nightly Heidi Dach, MD   600 mg at 05/23/20 2058 ? metoprolol succinate (TOPROL-XL) 24 hr tablet 12.5 mg  12.5 mg Oral Daily Heidi Dach, MD   12.5 mg at 05/24/20 0855 ? nicotine (NICODERM CQ) transdermal patch 24 hr 21 mg  21 mg Transdermal Daily Joyice Faster, APRN   21 mg at 05/24/20 0856 ? oxybutynin XL (DITROPAN-XL) 24 hr tablet 5 mg  5 mg Oral Daily Heidi Dach, MD   5 mg at 05/24/20 0855 ? predniSONE (DELTASONE) tablet 100 mg  100 mg Oral Daily Heidi Dach, MD   100 mg at 05/24/20 0855 ? [START ON 05/26/2020] predniSONE (DELTASONE) tablet 90 mg  90 mg Oral Daily Heidi Dach, MD     ? rosuvastatin (CRESTOR) tablet 40 mg  40 mg Oral Daily Heidi Dach, MD   40 mg at 05/24/20 0855 ? senna-docusate (SENNA-PLUS) 8.6-50 mg per tablet 1 tablet  1 tablet Oral Nightly Luetta Nutting, MD   1 tablet at 05/23/20 2058 ? sodium chloride 0.9 % flush 3 mL  3 mL IV Push Q8H Heidi Dach, MD   3 mL at 05/22/20 0509 ? [START ON 05/25/2020] sulfamethoxazole-trimethoprim (BACTRIM DS;CO-TRIMOXAZOLE DS) 800-160 mg per tablet 1 tablet  1 tablet Oral Once per day on Mon Wed Fri Joyice Faster, APRN     ? belladonna alkaloids-opium  1 suppository Rectal BID PRN ? bisacodyL  10 mg Rectal DAILY PRN ? calcium carbonate  500 mg Oral TID PRN ? lidocaine uro-jet  11 mL INTRA-URETHRAL BID PRN ? oxyCODONE  5 mg Oral Q6H PRN ? polyethylene glycol  17 g Oral DAILY PRN ? sodium chloride  3  mL IV Push PRN for Line Care Allergies: Patient has no known allergies.Objective: Vitals:Temp:  [97.3 ?F (36.3 ?C)-99 ?F (37.2 ?C)] 98.2 ?F (36.8 ?C)Pulse:  [64-92] 65Resp:  [18-20] 20BP: (101-189)/(61-97) 189/97SpO2:  [92 %-96 %] 95 %(!) 139 kg Physical Exam:Body mass index is 41.56 kg/m?Marland KitchenPhysical ExamVitals and nursing note reviewed. Constitutional:     Appearance: Normal appearance. He is obese.    Comments: 69yo male sitting in chair in NAD HENT:    Head: Normocephalic and atraumatic. Cardiovascular:    Rate and Rhythm: Normal rate and regular rhythm. Pulmonary:    Effort: Pulmonary effort is normal. No respiratory distress.    Breath sounds: No wheezing, rhonchi or rales. Abdominal:    General: Bowel sounds are normal. Musculoskeletal:       General: No swelling. Skin:   General: Skin is warm and dry. Neurological:    Mental Status: He is alert. Psychiatric:       Mood and Affect: Mood normal.       Behavior: Behavior normal. Lines/Ports/Drains: Port site c/d/iIntake/Output Summary (Last 24 hours) at 05/24/2020 1016Last data filed at 05/24/2020 0900Gross per 24 hour Intake -- Output 3555 ml Net -3555 ml Laboratory / Imaging: Recent Labs Lab 09/26/210509 09/27/210246 09/28/210529 WBC 10.6* 12.3* 12.5* HGB 11.2* 10.6* 10.0* HCT 33.7* 31.7* 30.4* PLT 191 207 240  Recent Labs Lab 09/26/210509 09/27/210246 09/28/210529 NEUTROPHILS 81.2 81.0 85.6*  Recent Labs Lab 09/26/210509 09/26/210509 09/27/210246 09/28/210529 NA 141  --  139 136 K 4.2  --  4.3 4.7 CL 106  --  105 104 CO2 25  --  26 22 BUN 27*  --  25* 28* CREATININE 1.42*  --  1.37* 1.54* GLU 131*   < > 132* 142* ANIONGAP 10  --  8 10  < > = values in this interval not displayed.  Recent Labs Lab 09/26/210509 09/27/210246 09/28/210529 CALCIUM 8.4* 8.1* 8.6* MG  --   --  2.2 PHOS  --   --  3.5  Recent Labs Lab 09/25/211835 09/27/210246 09/28/210529 ALT 44 34 23 AST 18 16 24  ALKPHOS 68 66 70 BILITOT 0.2 0.2 0.2 PROT 5.8* 5.2* 5.9* ALBUMIN 3.2* 2.9* 2.9*  Recent Labs Lab 09/25/211922 09/28/210529 PTT 23.1 22.5* LABPROT 9.9 10.3 INR 0.90 0.94  Microbiology:Recent Results (from the past 168 hour(s)) Urine microscopic     ([facility] Y[facility])  Collection Time: 05/21/20  7:18 PM Result Value Ref Range  Epithelial Cells Few None-Few /LPF  Hyaline Casts, UA 3 0 - 3 /LPF  Bacteria, UA None None-Few /HPF  WBC/HPF, UA 2 0 - 5 /HPF  RBC/HPF, UA 731 (H) 0 - 2 /HPF Urinalysis-macroscopic w/reflex microscopic  Collection Time: 05/21/20  7:18 PM Result Value Ref Range  Clarity, UA Cloudy (A) Clear  Color, UA Brown (A) Yellow  Specific Gravity, UA 1.012 1.005 - 1.030  pH, UA 6.5 5.5 - 7.5  Protein, UA 1+ (A) Negative-Trace  Glucose, UA Negative Negative  Ketones, UA Negative Negative  Blood, UA 3+ (A) Negative  Bilirubin, UA Negative Negative  Leukocytes, UA Negative Negative  Nitrite, UA Negative Negative  Urobilinogen, UA <2.0 <=2.0 EU/dL Urine microscopic     ([facility] LM[facility] Y[facility])  Collection Time: 05/19/20  1:22 PM Result Value Ref Range  Manual Microscopic Performed   Epithelial Cells None None-Few /LPF  Hyaline Casts, UA 0-3 0 - 3 /LPF  WBC/HPF 3-5 0 - 5 /HPF  RBC/HPF Many (A) 0 - 2 /HPF  Bacteria, UA None None-Few /HPF  Imaging Last 24 Hours: No results found.Assessment: 69yo male with PMH of CAD (s/p MI x2, s/p PCI x4), HFrEF, emphysema, 75 pack year smoking history, OSA (on CPAP), nephrolithiasis, hematuria (intermittent for ~64yr), spinal stenosis, depression, and stage IV NSCLC (initially received care in [place], dx'd 10/2019 started on Carbo/Taxol/pembro C1D1=11/28/19 and after 2 cycles transitioned care to 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 (followed by Onco-Cards, seen on cardiac MRI 04/19/20 w/ stress PET having findings of ischemia and non-ischemia, prev on dual-antiplt therapy w/ tent plan for LHC but plavix held 2/2 hematuria, continues on pred taper) and recurrent gross hematuria (referred to Urology as outpatient w/ plan for cystoscopy) with multiple recent admits: admitted 9/13-9/16 after noted wt gain, pitting edema and worsening DOE c/f acute decompensated HF w/ TTE c/w diastolic dysfunction exacerbation; he was diuresed, started on metop and discharged on lasix 40mg  PO QD and metop succinate 12.5mg  daily w/ plan for outpatient Cystoscopy for ongoing hematuria. ECC visit 9/18 w/ recurrent hematuria and urinary retention; seen by Urology, manually irrigated w/ clearance of urine, and sent home w/ foley catheter. Again admitted 9/23-9/24 on Urology service after p/w urinary retention, penile pain and poor foley drainage; he underwent bedside irrigation and given urine was clear red without clots and Hgb acceptable 12.2 and Cr at baseline, no operative intervention was recommended but planned to have cystoscopy as outpatient and Cardiology recommended holding plavix until outpatient f/u 06/03/20. He now presents for urinary retention and pain. In the ECC, foley was flushed w/ evacuation of clots and improvement in symptoms, Urology was consulted, he was given CTX, and admitted for further management. 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 H/H stable and urine now clear yellow; foley in place but CBI clamped this morning (9/28). S/p EBUS today (9/28) to confirm that [facility] is truly lung primary and not met from bladder. 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 Next f/u scheduled 10/8 and repeat cMRI for 10/28. - Of note, on recent admit cardio onc  felt clinical findings/sx?and echo?were consistent with likely diastolic dysfunction exacerbation, could be?from ICI myocarditis and/or ischemic disease/microvascular dysfunction, likely both is possible given ICI can increase risk of atherosclerosis progression and plaque rupture- Continue prednisone 100mg  PO QDw/ plan to transition to 90mg  QD as scheduled starting 9/30 (was started on pred 8/25 1mg /kg= 130mg  and is on taper of 10mg /wk per Cardio-Onc)- Continue pepcid for GI ppx- Continue Bactrim 1DS tab PO every MWF for PCP ppx given prolonged steroid use- Continue ASA 81mg   as per outpatient regimen- Per d/w Cards fellow 9/27, again ideally he would be on DAPT but agree to continue to hold in the setting of recurrent bleeding and would continue w/ ASA monotherapy and continue to discuss future plavix with outpatient cardiology once bleeding under control with ongoing discussion of LHC (see cards note from 9/24)- Recently started on plavix trial (~9/10) w/ plan for possible L heart cath but held since admission 9/23-9/24 given hematuria as above- Continue lasix 40mg  PO QD as per outpatient regimen- Continue metoprolol succinate 24hr tab 12.5mg  PO daily as per outpatient regimen- Continue statin as per outpatient regimen#stage IV NSCLC#43yr smoking hx#Hx OSA on CPAP- Primary Onc managemeng by Dr Layla Barter- Most recently on Carbo/Taxol/Pembro (last C4=03/10/20) w/ plan to transition to pembro maintenance but deferred given ongoing cardiac work-up- Bronch today (requested by Dr Layla Barter) to discern if squamous cell carcinoma in lung is primary or metastatic disease from urothelial primary- Continue oxycodone IR 5mg  q6hr PRN#Renal: ?CKD- Limited Cr values (all in past 3 months range ~1.3-1.5 and only one prior value in 2012 is 1.1)- Encourage PO intake- Trend Cr; appears to be at baselineChronic Issues:- Continue gabapentin 600mg  PO nightly for pain as per outpatient regimen- Continue lexapro 10mg  PO nightly as per outpatient regimen- Continue nicotine patch for hx of smoking; add ativan 0.5mg  q6hr PRN for anxiety/agitation related to smoking cravingsHospital Care:- Nutrition: Cardiac diet- Prophylaxis: SCDs- Bowel Regimen: senna-plus 1 tab nightly, bisacodyl suppository 10mg  daily PRN, miralax daily PRN- Discharge Planning Needs: none identified, has f/u w/ Dr Layla Barter 10/7, Urology will make f/u at discharge- Code Status: FULLI have personally discussed the plan of care with the patient. YesI have personally updated family regarding the patient?s admission. NoI have reviewed the most pertinent aspects of the current plan of care with the bedside nurse: yes Electronically Signed:Gaetano Romberger, PA-C Best Contact: MHB09/28/215:37 PM

## 2020-05-25 ENCOUNTER — Telehealth
Admit: 2020-05-25 | Payer: PRIVATE HEALTH INSURANCE | Attending: Student in an Organized Health Care Education/Training Program

## 2020-05-25 DIAGNOSIS — N401 Enlarged prostate with lower urinary tract symptoms: Secondary | ICD-10-CM

## 2020-05-25 DIAGNOSIS — E669 Obesity, unspecified: Secondary | ICD-10-CM

## 2020-05-25 DIAGNOSIS — R590 Localized enlarged lymph nodes: Secondary | ICD-10-CM

## 2020-05-25 DIAGNOSIS — F17210 Nicotine dependence, cigarettes, uncomplicated: Secondary | ICD-10-CM

## 2020-05-25 DIAGNOSIS — G4733 Obstructive sleep apnea (adult) (pediatric): Secondary | ICD-10-CM

## 2020-05-25 DIAGNOSIS — Z9221 Personal history of antineoplastic chemotherapy: Secondary | ICD-10-CM

## 2020-05-25 DIAGNOSIS — Z9049 Acquired absence of other specified parts of digestive tract: Secondary | ICD-10-CM

## 2020-05-25 DIAGNOSIS — Z6841 Body Mass Index (BMI) 40.0 and over, adult: Secondary | ICD-10-CM

## 2020-05-25 DIAGNOSIS — I514 Myocarditis, unspecified: Secondary | ICD-10-CM

## 2020-05-25 DIAGNOSIS — C7800 Secondary malignant neoplasm of unspecified lung: Secondary | ICD-10-CM

## 2020-05-25 DIAGNOSIS — F419 Anxiety disorder, unspecified: Secondary | ICD-10-CM

## 2020-05-25 DIAGNOSIS — E1122 Type 2 diabetes mellitus with diabetic chronic kidney disease: Secondary | ICD-10-CM

## 2020-05-25 DIAGNOSIS — E559 Vitamin D deficiency, unspecified: Secondary | ICD-10-CM

## 2020-05-25 DIAGNOSIS — C679 Malignant neoplasm of bladder, unspecified: Secondary | ICD-10-CM

## 2020-05-25 DIAGNOSIS — N3289 Other specified disorders of bladder: Secondary | ICD-10-CM

## 2020-05-25 DIAGNOSIS — N189 Chronic kidney disease, unspecified: Secondary | ICD-10-CM

## 2020-05-25 DIAGNOSIS — I252 Old myocardial infarction: Secondary | ICD-10-CM

## 2020-05-25 DIAGNOSIS — E291 Testicular hypofunction: Secondary | ICD-10-CM

## 2020-05-25 DIAGNOSIS — E785 Hyperlipidemia, unspecified: Secondary | ICD-10-CM

## 2020-05-25 DIAGNOSIS — Z7982 Long term (current) use of aspirin: Secondary | ICD-10-CM

## 2020-05-25 DIAGNOSIS — Z955 Presence of coronary angioplasty implant and graft: Secondary | ICD-10-CM

## 2020-05-25 DIAGNOSIS — N1330 Unspecified hydronephrosis: Secondary | ICD-10-CM

## 2020-05-25 DIAGNOSIS — K219 Gastro-esophageal reflux disease without esophagitis: Secondary | ICD-10-CM

## 2020-05-25 DIAGNOSIS — M4800 Spinal stenosis, site unspecified: Secondary | ICD-10-CM

## 2020-05-25 DIAGNOSIS — Z20822 Contact with and (suspected) exposure to covid-19: Secondary | ICD-10-CM

## 2020-05-25 DIAGNOSIS — I2510 Atherosclerotic heart disease of native coronary artery without angina pectoris: Secondary | ICD-10-CM

## 2020-05-25 DIAGNOSIS — N029 Recurrent and persistent hematuria with unspecified morphologic changes: Secondary | ICD-10-CM

## 2020-05-25 DIAGNOSIS — I5040 Unspecified combined systolic (congestive) and diastolic (congestive) heart failure: Secondary | ICD-10-CM

## 2020-05-25 DIAGNOSIS — I130 Hypertensive heart and chronic kidney disease with heart failure and stage 1 through stage 4 chronic kidney disease, or unspecified chronic kidney disease: Secondary | ICD-10-CM

## 2020-05-25 DIAGNOSIS — R338 Other retention of urine: Secondary | ICD-10-CM

## 2020-05-25 DIAGNOSIS — K5900 Constipation, unspecified: Secondary | ICD-10-CM

## 2020-05-25 DIAGNOSIS — N3091 Cystitis, unspecified with hematuria: Secondary | ICD-10-CM

## 2020-05-25 LAB — URINE MICROSCOPIC     (BH GH LMW YH)
BKR RBC/HPF: >30 /HPF — AB (ref 0–2)
BKR WBC/HPF: >30 /HPF — AB (ref 0–5)

## 2020-05-25 LAB — COMPREHENSIVE METABOLIC PANEL
BKR A/G RATIO: 1.1 (ref 1.0–2.2)
BKR ALANINE AMINOTRANSFERASE (ALT): 33 U/L (ref 9–59)
BKR ALBUMIN: 3 g/dL — ABNORMAL LOW (ref 3.6–4.9)
BKR ALKALINE PHOSPHATASE: 69 U/L (ref 9–122)
BKR ANION GAP: 11 (ref 7–17)
BKR ASPARTATE AMINOTRANSFERASE (AST): 25 U/L (ref 10–35)
BKR AST/ALT RATIO: 0.8 % (ref 0.0–1.0)
BKR BILIRUBIN TOTAL: 0.2 mg/dL (ref <=1.2)
BKR BLOOD UREA NITROGEN: 40 mg/dL — ABNORMAL HIGH (ref 8–23)
BKR BUN / CREAT RATIO: 25.2 x 1000/??L — ABNORMAL HIGH (ref 8.0–23.0)
BKR CALCIUM: 8.6 mg/dL — ABNORMAL LOW (ref 8.8–10.2)
BKR CHLORIDE: 105 mmol/L (ref 98–107)
BKR CO2: 23 mmol/L (ref 20–30)
BKR CREATININE: 1.59 mg/dL — ABNORMAL HIGH (ref 0.40–1.30)
BKR EGFR (AFR AMER): 53 mL/min/{1.73_m2} (ref >60)
BKR EGFR (NON AFRICAN AMERICAN): 44 mL/min/1.73m2 — ABNORMAL HIGH (ref >60)
BKR GLOBULIN: 2.8 g/dL (ref 2.3–3.5)
BKR GLUCOSE: 103 mg/dL — ABNORMAL HIGH (ref 70–100)
BKR POTASSIUM: 4.6 mmol/L (ref 3.3–5.1)
BKR PROTEIN TOTAL: 5.8 g/dL — ABNORMAL LOW (ref 6.6–8.7)
BKR SODIUM: 139 mmol/L — ABNORMAL LOW (ref 136–144)
BKR WAM MPV: 103 mg/dL — ABNORMAL HIGH (ref 70–100)

## 2020-05-25 LAB — URINALYSIS WITH CULTURE REFLEX      (BH LMW YH)
BKR GLUCOSE, UA: NEGATIVE
BKR KETONES, UA: NEGATIVE
BKR NITRITE, UA: NEGATIVE
BKR PH, UA: 6 (ref 5.5–7.5)
BKR SPECIFIC GRAVITY, UA: 1.016 (ref 1.005–1.030)
BKR UROBILINOGEN, UA: <2 EU/dL (ref <=2.0)

## 2020-05-25 LAB — CBC WITH AUTO DIFFERENTIAL
BKR BILIRUBIN, UA: 2 % (ref 0.0–3.0)
BKR LEUKOCYTE ESTERASE, UA: 0.3 x 1000/??L — AB (ref 0.0–0.3)
BKR WAM ABSOLUTE IMMATURE GRANULOCYTES: 0.3 x 1000/ÂµL (ref 0.0–0.3)
BKR WAM ABSOLUTE LYMPHOCYTE COUNT: 2 x 1000/ÂµL (ref 1.0–4.0)
BKR WAM ABSOLUTE NRBC: 0 x 1000/??L (ref 0.0–0.0)
BKR WAM ANALYZER ANC: 12.6 x 1000/ÂµL — ABNORMAL HIGH (ref 1.0–11.0)
BKR WAM BASOPHIL ABSOLUTE COUNT: 0 x 1000/??L (ref 0.0–0.0)
BKR WAM BASOPHILS: 0.2 % (ref 0.0–4.0)
BKR WAM EOSINOPHIL ABSOLUTE COUNT: 0 x 1000/ÂµL (ref 0.0–1.0)
BKR WAM HEMATOCRIT: 32.6 % — ABNORMAL LOW (ref 37.0–52.0)
BKR WAM HEMOGLOBIN: 10.7 g/dL — ABNORMAL LOW (ref 12.0–18.0)
BKR WAM IMMATURE GRANULOCYTES: 2 % (ref 0.0–3.0)
BKR WAM LYMPHOCYTES: 12.7 % (ref 8.0–49.0)
BKR WAM MCH (PG): 31.1 pg — ABNORMAL HIGH (ref 27.0–31.0)
BKR WAM MCHC: 32.8 g/dL (ref 31.0–36.0)
BKR WAM MCV: 94.8 fL — ABNORMAL HIGH (ref 78.0–94.0)
BKR WAM MONOCYTE ABSOLUTE COUNT: 0.9 x 1000/??L — ABNORMAL LOW (ref 0.0–2.0)
BKR WAM MONOCYTES: 5.7 % — ABNORMAL LOW (ref 4.0–15.0)
BKR WAM NEUTROPHILS: 79.3 % (ref 37.0–84.0)
BKR WAM NUCLEATED RED BLOOD CELLS: 0 % (ref 0.0–1.0)
BKR WAM PLATELETS: 266 x1000/??L (ref 140–440)
BKR WAM RDW-CV: 15.3 % — ABNORMAL HIGH (ref 11.5–14.5)
BKR WAM RED BLOOD CELL COUNT: 3.4 M/??L — ABNORMAL LOW (ref 3.8–5.9)
BKR WAM WHITE BLOOD CELL COUNT: 15.9 x1000/??L — ABNORMAL HIGH (ref 4.0–10.0)

## 2020-05-25 LAB — MAGNESIUM: BKR MAGNESIUM: 2.2 mg/dL (ref 1.7–2.4)

## 2020-05-25 LAB — UA REFLEX CULTURE

## 2020-05-25 LAB — PHOSPHORUS     (BH GH L LMW YH): BKR PHOSPHORUS: 3.6 mg/dL (ref 2.2–4.5)

## 2020-05-25 MED ORDER — BELLADONNA ALKALOIDS-OPIUM 16.2 MG-30 MG RECTAL SUPPOSITORY
Freq: Two times a day (BID) | RECTAL | 1 refills | Status: AC | PRN
Start: 2020-05-25 — End: ?

## 2020-05-25 MED ORDER — FAMOTIDINE 20 MG TABLET
20 mg | Freq: Every day | ORAL | Status: DC
Start: 2020-05-25 — End: 2020-05-26
  Administered 2020-05-25: 13:00:00 20 mg via ORAL

## 2020-05-25 MED ORDER — SIMETHICONE 80 MG CHEWABLE TABLET
80 mg | Freq: Four times a day (QID) | ORAL | Status: DC | PRN
Start: 2020-05-25 — End: 2020-05-26
  Administered 2020-05-25: 16:00:00 80 mg via ORAL

## 2020-05-25 MED ORDER — SENNOSIDES 8.6 MG-DOCUSATE SODIUM 50 MG TABLET
ORAL_TABLET | Freq: Every evening | ORAL | 1 refills | Status: AC
Start: 2020-05-25 — End: ?

## 2020-05-25 MED ORDER — SIMETHICONE 80 MG CHEWABLE TABLET
80 mg | ORAL_TABLET | Freq: Four times a day (QID) | ORAL | 1 refills | Status: AC | PRN
Start: 2020-05-25 — End: ?

## 2020-05-25 MED ORDER — SULFAMETHOXAZOLE 800 MG-TRIMETHOPRIM 160 MG TABLET
800-160 mg | ORAL_TABLET | ORAL | 1 refills | Status: AC
Start: 2020-05-25 — End: ?

## 2020-05-25 MED ORDER — NICOTINE 7 MG/24 HR DAILY TRANSDERMAL PATCH
7 mg | MEDICATED_PATCH | Freq: Every day | TRANSDERMAL | 1 refills | Status: DC
Start: 2020-05-25 — End: 2020-06-09

## 2020-05-25 MED ORDER — FAMOTIDINE 20 MG TABLET
20 mg | ORAL_TABLET | Freq: Every day | ORAL | 1 refills | Status: SS
Start: 2020-05-25 — End: 2020-05-30

## 2020-05-25 MED ORDER — LORAZEPAM 0.5 MG TABLET
0.5 mg | ORAL_TABLET | Freq: Four times a day (QID) | ORAL | 1 refills | Status: AC | PRN
Start: 2020-05-25 — End: ?

## 2020-05-25 MED ORDER — POLYETHYLENE GLYCOL 3350 17 GRAM ORAL POWDER PACKET
17 gram | Freq: Every day | ORAL | 1 refills | Status: AC | PRN
Start: 2020-05-25 — End: ?

## 2020-05-25 MED ORDER — GABAPENTIN 300 MG CAPSULE
300 mg | ORAL_CAPSULE | Freq: Every evening | ORAL | 1 refills | Status: AC
Start: 2020-05-25 — End: ?

## 2020-05-25 MED ORDER — ALBUTEROL SULFATE HFA 90 MCG/ACTUATION AEROSOL INHALER
90 mcg/actuation | Freq: Four times a day (QID) | RESPIRATORY_TRACT | 1 refills | Status: AC | PRN
Start: 2020-05-25 — End: ?

## 2020-05-25 NOTE — Telephone Encounter
Patient needs:Paul Hunter Visit: Within Hunter - 4 weeks. In Person Visit / Must be Face to Face.  with Dr. Malen Gauze for TURBT follow up. Can cancel appointment with Dr. Ed Blalock Appointments     Provider Department Center  05/27/2020 8:00 AM Paul Hunter, Paul [place], Paul Hunter [facility] CAD  06/02/2020 9:00 AM Paul Dom, Paul Hunter [facility] Thoracic Oncology Program at [facility] at [address] [facility] [place]  06/03/2020 11:00 AM Paul Hunter, Paul Burrow., Paul Hunter [facility] Onco-Oncology Program at [facility] [place] M  06/08/2020 8:00 AM Paul Brass, Paul Hunter [facility] Spine Center at [address] [facility] Rad  [id number] 7:45 AM Paul Hunter, Paul Milch., Paul Hunter [facility] Urology at [address] [facility] Y[facility] CAD  06/21/2020 12:00 PM Paul Hunter Paul Hunter [facility] MRI [facility] Rad

## 2020-05-25 NOTE — Discharge Instructions
It has been a pleasure serving on your medical team at [facility]. If you have any questions regarding your hospitalization, please contact the office of Dr. Layla Barter. We wish you the best!Starting tomorrow, 9/30 your Prednisone taper will decrease to 90mg . Please follow previous instructions.

## 2020-05-25 NOTE — Plan of Care
Plan of Care Overview/ Patient Status    NPN 19:00-07:00:A&Ox4. Standby oob, calling appropriately. Intermittent hypertension, otherwise VSS, afebrile. On room air satting WNL, dyspneic on exertion. RSL port, c/d/i, +flush/brisk BR, morning labs drawn and sent, capped. LBM 9/28. 3 way foley in place, CBI d/cd in morning of 9/28. Foley putting out clear yellow urine. Output slowed throughout night. Bladder scanned for 18 mL, catheter flushed and irrigated with very scant amount of light tinged pink urine returned followed by yellow urine. Urology in to see patient, plan to monitor, remove foley and do voiding trial with outpatient follow up. No blood, no clots, no c/o of bladder spasms or pain from patient. Nicotine patches to right arm replaced, given nicotine lozenges for cravings. Patient resting between care, although awake most of night, plan of care explained and emotional support provided. Frequent safety checks completed, patient safety maintained throughout.  al: Plan of Care ReviewOutcome: Interventions implemented as appropriateGoal: Patient-Specific Goal (Individualized)Outcome: Interventions implemented as appropriateGoal: Absence of Hospital-Acquired Illness or InjuryOutcome: Interventions implemented as appropriateGoal: Optimal Comfort and WellbeingOutcome: Interventions implemented as appropriateGoal: Readiness for Transition of CareOutcome: Interventions implemented as appropriate Problem: Fall Injury RiskGoal: Absence of Fall and Fall-Related InjuryOutcome: Interventions implemented as appropriate

## 2020-05-25 NOTE — Plan of Care
Plan of Care Overview/ Patient Status    Paul Hunter was discharged via Private Car accompanied by [employer].  [phone]) Pt is A+Ox4. VSS, htn to 140s systolic. Satting WNL on RA. DOE at times. Independent OOB w/ cane. Foley removed this shift, pt able to void clear yellow urine frequently on his own. PVR <45 ml. 1 BM this shift. Cardiac diet, good PO intake. Skin intact. C/o abd cramping, given roxi x1 and simethicone x1 w/ good effect. Verbalized understanding of discharge instructions and recommended follow up care as per the after visit summary. Written discharge instructions provided. To continue on PO prednisone taper. Denies any further questions. POC hep flushed and deaccessed. All belongings sent w/ pt. D/ced to home via private car. Vital signs    Vitals:  05/25/20 0037 05/25/20 0518 05/25/20 0735 05/25/20 1143 BP: (!) 143/74 (!) 154/82 (!) 142/71 129/89 Pulse: 89 82 69 75 Resp: 18 17 16 16  Temp: 98.6 ?F (37 ?C) 98 ?F (36.7 ?C) 98 ?F (36.7 ?C) 98.3 ?F (36.8 ?C) TempSrc: Oral Oral Oral Oral SpO2: 95% 94% 94% 98% Weight:     Height:     Problem: Adult Inpatient Plan of CareGoal: Plan of Care ReviewOutcome: Outcome(s) achievedGoal: Patient-Specific Goal (Individualized)Outcome: Outcome(s) achievedGoal: Absence of Hospital-Acquired Illness or InjuryOutcome: Outcome(s) achievedGoal: Optimal Comfort and WellbeingOutcome: Outcome(s) achievedGoal: Readiness for Transition of CareOutcome: Outcome(s) achieved Problem: Fall Injury RiskGoal: Absence of Fall and Fall-Related InjuryOutcome: Outcome(s) achieved

## 2020-05-25 NOTE — Plan of Care
Plan of Care Overview/ Patient Status    Problem: Adult Inpatient Plan of CareGoal: Readiness for Transition of CareOutcome: Outcome(s) achieved CM Discharge Planning    Most Recent Value Discharge Planning Concerns to be Addressed no discharge needs identified Referral(s) placed for: None Home Health Care Services Required N/A Patient is considered homebound due to: (he/she requires considerable and taxing effort to leave their residence for medical reasons or religious services OR infrequently OR of short duration for other reasons) n/a Equipment Needed After Discharge none Mode of Transportation  Private car  (add comment for special considerations) Patient to be accompanied by family member Is the patient discharging to their home address Yes CM D/C Readiness PASRR completed and approved N/A Authorization number obtained, if required N/A Is there a 3 day INPATIENT Qualifying stay for Medicare Patients? Yes DME Authorized/Delivered N/A No needs identified/ follow up with PCP/MD Yes Post acute care services secured W10 complete N/A Pri Completed and Accepted  N/A Finalized Plan Expected Discharge Date 05/25/20 Discharge Disposition Home or Self Care Post acute care services secured W10 complete N/A Physician documentation required AVS (Patient Instructions), Prescriptions  Patient is medically ready and discharging today. Patient will be discharged as independent.  Patient has agreed on the plan of discharge.  Family will provide transport upon discharge to home.  Contact Information:Paul Simone C. Letta Kocher   RN, MBA, BSN, [facility], [facility]	Care [phone]

## 2020-05-25 NOTE — Progress Notes
Urology Progress note Procedure(s) (LRB):EBUS (N/A)Subjective:S/p EBUS TBNA in right lower nodule in the setting of metastatic disease Surgical path still pending CBI clamped yesterday, urine CYU in the am Gently irrigated with clear irrigant return Cr 1.59(1.54)  Hgb 10.7(12.5) Vitals:  05/25/20 0518 BP: (!) 154/82 Pulse: 82 Resp: 17 Temp: 98 ?F (36.7 ?C) Intake/Output Summary (Last 24 hours) at 05/25/2020 0656Last data filed at 05/25/2020 0416Gross per 24 hour Intake 1160 ml Output 2930 ml Net -1770 ml Complete Blood CountResults in Past 7 DaysResult Component Current Result Hematocrit 32.6 (L) (05/25/2020) Hemoglobin 10.7 (L) (05/25/2020) MCH 31.1 (H) (05/25/2020) MCHC 32.8 (05/25/2020) MCV 94.8 (H) (05/25/2020) MPV 9.7 (05/25/2020) Platelets 266 (05/25/2020) RBC 3.4 (L) (05/25/2020) WBC 15.9 (H) (05/25/2020) Lab Results Component Value Date  CREATININE 1.59 (H) 05/25/2020  BUN 40 (H) 05/25/2020  NA 139 05/25/2020  K 4.6 05/25/2020  CL 105 05/25/2020  CO2 23 05/25/2020 Micro:Lab Results Component Value Date  LABURIN No Growth 05/21/2020 Physical ExamGen:NAD, appears comfortbale Pulm:non labored breathing on RA CV: RRRAbd:soft, obese abdomen non-tender, no CVA tenderness GU: Foley in place with CYU Ext: WWP x 4 Assessment: Paul Hunter is a 69 y.o. male with PMHx of NSCLC on chemo, CAD ( s/p PCOI on ASA/Plavix) sleep apnea on CPAP, nephrolithiasis, and recurrent hematuria. He is s/p cystoscopy, clot evacuation, TURBT, L retrograde pyelogram and L URS and urethral biopsy on 9/26 and cystoscopy, clot evacuation on 9/27. Patient now with CYU. However, cr continues to uptrend. Plan: - Trend cr - Possible DC foley Electronically Signed by Renaye Rakers, MD, September 29, 2021MHB: [phone] Urology PGY 2 Floor Pager (for primary patients): [phone] Pager: [phone] from 6am-6pm. Amion for nights/weekends.

## 2020-05-26 ENCOUNTER — Ambulatory Visit: Admit: 2020-05-26 | Payer: PRIVATE HEALTH INSURANCE | Attending: Hematology

## 2020-05-26 LAB — URINE CULTURE: BKR URINE CULTURE, ROUTINE: NO GROWTH

## 2020-05-26 NOTE — Telephone Encounter
Cancelled Dr. Meryl Dare appointment. Email sent to management for approval for Dr. Malen Gauze appointment.

## 2020-05-27 ENCOUNTER — Inpatient Hospital Stay: Admit: 2020-05-27 | Payer: PRIVATE HEALTH INSURANCE

## 2020-05-27 ENCOUNTER — Inpatient Hospital Stay: Admit: 2020-05-27 | Discharge: 2020-05-30 | Payer: PRIVATE HEALTH INSURANCE

## 2020-05-27 ENCOUNTER — Encounter: Admit: 2020-05-27 | Payer: PRIVATE HEALTH INSURANCE | Attending: Pulmonary Disease

## 2020-05-27 ENCOUNTER — Telehealth: Admit: 2020-05-27 | Payer: PRIVATE HEALTH INSURANCE | Attending: Hematology

## 2020-05-27 ENCOUNTER — Inpatient Hospital Stay: Admit: 2020-05-27 | Discharge: 2020-05-27 | Payer: PRIVATE HEALTH INSURANCE

## 2020-05-27 ENCOUNTER — Ambulatory Visit: Admit: 2020-05-27 | Payer: PRIVATE HEALTH INSURANCE | Attending: Pain Medicine

## 2020-05-27 ENCOUNTER — Ambulatory Visit: Admit: 2020-05-27 | Payer: PRIVATE HEALTH INSURANCE | Attending: Pulmonary Disease

## 2020-05-27 ENCOUNTER — Telehealth: Admit: 2020-05-27 | Payer: PRIVATE HEALTH INSURANCE | Attending: Pain Medicine

## 2020-05-27 DIAGNOSIS — N400 Enlarged prostate without lower urinary tract symptoms: Secondary | ICD-10-CM

## 2020-05-27 DIAGNOSIS — C7801 Secondary malignant neoplasm of right lung: Secondary | ICD-10-CM

## 2020-05-27 DIAGNOSIS — R55 Syncope and collapse: Secondary | ICD-10-CM

## 2020-05-27 DIAGNOSIS — E291 Testicular hypofunction: Secondary | ICD-10-CM

## 2020-05-27 DIAGNOSIS — I10 Essential (primary) hypertension: Secondary | ICD-10-CM

## 2020-05-27 DIAGNOSIS — C3490 Malignant neoplasm of unspecified part of unspecified bronchus or lung: Secondary | ICD-10-CM

## 2020-05-27 DIAGNOSIS — R079 Chest pain, unspecified: Secondary | ICD-10-CM

## 2020-05-27 DIAGNOSIS — I2510 Atherosclerotic heart disease of native coronary artery without angina pectoris: Secondary | ICD-10-CM

## 2020-05-27 DIAGNOSIS — K219 Gastro-esophageal reflux disease without esophagitis: Secondary | ICD-10-CM

## 2020-05-27 DIAGNOSIS — E119 Type 2 diabetes mellitus without complications: Secondary | ICD-10-CM

## 2020-05-27 DIAGNOSIS — R0609 Other forms of dyspnea: Secondary | ICD-10-CM

## 2020-05-27 DIAGNOSIS — D494 Neoplasm of unspecified behavior of bladder: Secondary | ICD-10-CM

## 2020-05-27 DIAGNOSIS — F419 Anxiety disorder, unspecified: Secondary | ICD-10-CM

## 2020-05-27 DIAGNOSIS — R42 Dizziness and giddiness: Secondary | ICD-10-CM

## 2020-05-27 DIAGNOSIS — I25119 Atherosclerotic heart disease of native coronary artery with unspecified angina pectoris: Secondary | ICD-10-CM

## 2020-05-27 DIAGNOSIS — Z720 Tobacco use: Secondary | ICD-10-CM

## 2020-05-27 DIAGNOSIS — E785 Hyperlipidemia, unspecified: Secondary | ICD-10-CM

## 2020-05-27 DIAGNOSIS — M4800 Spinal stenosis, site unspecified: Secondary | ICD-10-CM

## 2020-05-27 DIAGNOSIS — E559 Vitamin D deficiency, unspecified: Secondary | ICD-10-CM

## 2020-05-27 DIAGNOSIS — I514 Myocarditis, unspecified: Secondary | ICD-10-CM

## 2020-05-27 DIAGNOSIS — Z85118 Personal history of other malignant neoplasm of bronchus and lung: Secondary | ICD-10-CM

## 2020-05-27 DIAGNOSIS — T884XXA Failed or difficult intubation, initial encounter: Secondary | ICD-10-CM

## 2020-05-27 DIAGNOSIS — K921 Melena: Secondary | ICD-10-CM

## 2020-05-27 LAB — CBC WITH AUTO DIFFERENTIAL
BKR GLUCOSE: 278 x1000/??L (ref 140–440)
BKR WAM ABSOLUTE IMMATURE GRANULOCYTES: 0.5 x 1000/??L — ABNORMAL HIGH (ref 0.0–0.3)
BKR WAM ABSOLUTE LYMPHOCYTE COUNT: 3.2 x 1000/ÂµL (ref 1.0–4.0)
BKR WAM ABSOLUTE NRBC: 0 x 1000/ÂµL (ref 0.0–0.0)
BKR WAM ANALYZER ANC: 12.4 x 1000/??L — ABNORMAL HIGH (ref 1.0–11.0)
BKR WAM BASOPHIL ABSOLUTE COUNT: 0.1 x 1000/ÂµL — ABNORMAL HIGH (ref 0.0–0.0)
BKR WAM BASOPHILS: 0.3 % (ref 0.0–4.0)
BKR WAM EOSINOPHIL ABSOLUTE COUNT: 0 x 1000/ÂµL (ref 0.0–1.0)
BKR WAM HEMATOCRIT: 31.5 % — ABNORMAL LOW (ref 37.0–52.0)
BKR WAM HEMOGLOBIN: 10.6 g/dL — ABNORMAL LOW (ref 12.0–18.0)
BKR WAM IMMATURE GRANULOCYTES: 3.1 % — ABNORMAL HIGH (ref 0.0–3.0)
BKR WAM LYMPHOCYTES: 18.9 % (ref 8.0–49.0)
BKR WAM MCH (PG): 31.6 pg — ABNORMAL HIGH (ref 27.0–31.0)
BKR WAM MCHC: 33.7 g/dL (ref 31.0–36.0)
BKR WAM MCV: 94 fL (ref 78.0–94.0)
BKR WAM MONOCYTES: 5 % (ref 4.0–15.0)
BKR WAM MPV: 9.8 fL — ABNORMAL HIGH (ref 6.0–11.0)
BKR WAM NEUTROPHILS: 72.5 % (ref 37.0–84.0)
BKR WAM NUCLEATED RED BLOOD CELLS: 0 % (ref 0.0–1.0)
BKR WAM PLATELETS: 278 x1000/ÂµL (ref 140–440)
BKR WAM RDW-CV: 15.1 % — ABNORMAL HIGH (ref 11.5–14.5)
BKR WAM RED BLOOD CELL COUNT: 3.4 M/??L — ABNORMAL LOW (ref 3.8–5.9)
BKR WAM WHITE BLOOD CELL COUNT: 17.1 x1000/??L — ABNORMAL HIGH (ref 4.0–10.0)

## 2020-05-27 LAB — COMPREHENSIVE METABOLIC PANEL
BKR A/G RATIO: 1.2 (ref 1.0–2.2)
BKR ALANINE AMINOTRANSFERASE (ALT): 59 U/L (ref 9–59)
BKR ALBUMIN: 3 g/dL — ABNORMAL LOW (ref 3.6–4.9)
BKR ALKALINE PHOSPHATASE: 76 U/L (ref 9–122)
BKR ANION GAP: 13 % — ABNORMAL HIGH (ref 7–17)
BKR ASPARTATE AMINOTRANSFERASE (AST): 21 U/L (ref 10–35)
BKR AST/ALT RATIO: 0.4
BKR BILIRUBIN TOTAL: 0.2 mg/dL (ref <=1.2)
BKR BLOOD UREA NITROGEN: 32 mg/dL — ABNORMAL HIGH (ref 8–23)
BKR BUN / CREAT RATIO: 20.6 (ref 8.0–23.0)
BKR CALCIUM: 8.4 mg/dL — ABNORMAL LOW (ref 8.8–10.2)
BKR CHLORIDE: 103 mmol/L (ref 98–107)
BKR CO2: 25 mmol/L (ref 20–30)
BKR CREATININE: 1.55 mg/dL — ABNORMAL HIGH (ref 0.40–1.30)
BKR EGFR (AFR AMER): 54 mL/min/{1.73_m2} (ref >60)
BKR EGFR (NON AFRICAN AMERICAN): 45 mL/min/{1.73_m2} (ref >60)
BKR GLOBULIN: 2.6 g/dL — ABNORMAL LOW (ref 2.3–3.5)
BKR POTASSIUM: 3.6 mmol/L (ref 3.3–5.1)
BKR PROTEIN TOTAL: 5.6 g/dL — ABNORMAL LOW (ref 6.6–8.7)
BKR SODIUM: 141 mmol/L (ref 136–144)
BKR WAM RED BLOOD CELL COUNT.: 3 g/dL — ABNORMAL LOW (ref 3.6–4.9)

## 2020-05-27 LAB — NT-PROBNPE: BKR B-TYPE NATRIURETIC PEPTIDE, PRO (PROBNP): 1015 pg/mL — ABNORMAL HIGH (ref <125.0)

## 2020-05-27 LAB — MAGNESIUM: BKR MAGNESIUM: 2.1 mg/dL (ref 1.7–2.4)

## 2020-05-27 LAB — SARS COV-2 (COVID-19) RNA-~~LOC~~ LABS (BH GH LMW YH): BKR SARS-COV-2 RNA (COVID-19) (YH): NEGATIVE

## 2020-05-27 LAB — TROPONIN T     (Q): BKR TROPONIN T: 0.05 ng/mL — ABNORMAL HIGH (ref <0.01)

## 2020-05-27 MED ORDER — OXYCODONE IMMEDIATE RELEASE 5 MG TABLET
5 mg | Freq: Four times a day (QID) | ORAL | Status: DC | PRN
Start: 2020-05-27 — End: 2020-05-31

## 2020-05-27 MED ORDER — ENOXAPARIN 40 MG/0.4 ML SUBCUTANEOUS SYRINGE
40 mg/0.4 mL | SUBCUTANEOUS | Status: DC
Start: 2020-05-27 — End: 2020-05-28
  Administered 2020-05-28: 13:00:00 40 mg/0.4 mL via SUBCUTANEOUS

## 2020-05-27 MED ORDER — ALBUTEROL SULFATE HFA 90 MCG/ACTUATION AEROSOL INHALER
90 mcg/actuation | Freq: Four times a day (QID) | RESPIRATORY_TRACT | Status: DC | PRN
Start: 2020-05-27 — End: 2020-05-31

## 2020-05-27 MED ORDER — SODIUM CHLORIDE 0.9 % (FLUSH) INJECTION SYRINGE
0.9 % | INTRAVENOUS | Status: DC | PRN
Start: 2020-05-27 — End: 2020-05-31
  Administered 2020-05-29: 19:00:00 0.9 mL via INTRAVENOUS

## 2020-05-27 MED ORDER — SODIUM CHLORIDE 0.9 % (FLUSH) INJECTION SYRINGE
0.9 % | Status: DC | PRN
Start: 2020-05-27 — End: 2020-05-31

## 2020-05-27 MED ORDER — ASPIRIN 81 MG TABLET,DELAYED RELEASE
81 mg | Freq: Every day | ORAL | Status: DC
Start: 2020-05-27 — End: 2020-05-31
  Administered 2020-05-28 – 2020-05-30 (×3): 81 mg via ORAL

## 2020-05-27 MED ORDER — CALCIUM CARBONATE 200 MG CALCIUM (500 MG) CHEWABLE TABLET
500 mg (200 mg calcium) | Freq: Once | ORAL | Status: CP
Start: 2020-05-27 — End: ?
  Administered 2020-05-27: 19:00:00 500 mg (200 mg calcium) via ORAL

## 2020-05-27 MED ORDER — METOPROLOL SUCCINATE ER (TOPROL) 12.5 MG HALFTAB
12.5 mg | Freq: Every day | ORAL | Status: DC
Start: 2020-05-27 — End: 2020-05-29

## 2020-05-27 MED ORDER — FAMOTIDINE 20 MG TABLET
20 mg | Freq: Every day | ORAL | Status: DC
Start: 2020-05-27 — End: 2020-05-30
  Administered 2020-05-29: 13:00:00 20 mg via ORAL

## 2020-05-27 MED ORDER — TIOTROPIUM BROMIDE 18 MCG CAPSULE WITH INHALATION DEVICE
18 mcg | Freq: Every day | RESPIRATORY_TRACT | Status: AC
Start: 2020-05-27 — End: 2020-06-03

## 2020-05-27 MED ORDER — LORAZEPAM 0.5 MG TABLET
0.5 mg | Freq: Three times a day (TID) | ORAL | Status: DC | PRN
Start: 2020-05-27 — End: 2020-05-31

## 2020-05-27 MED ORDER — HEPARIN, PORCINE (PF) 100 UNIT/ML IN 0.9% SODIUM CHLORIDE IV SYRINGE
100 unit/mL | Status: DC | PRN
Start: 2020-05-27 — End: 2020-05-31
  Administered 2020-05-30: 20:00:00 100 mL

## 2020-05-27 MED ORDER — MULTIVITAMIN WITH FOLIC ACID 400 MCG TABLET
400 mcg | Freq: Every day | ORAL | Status: DC
Start: 2020-05-27 — End: 2020-05-31
  Administered 2020-05-28 – 2020-05-30 (×3): 400 mcg via ORAL

## 2020-05-27 MED ORDER — GABAPENTIN 600 MG TABLET
600 mg | Freq: Every evening | ORAL | Status: DC
Start: 2020-05-27 — End: 2020-05-31
  Administered 2020-05-28 – 2020-05-30 (×3): 600 mg via ORAL

## 2020-05-27 MED ORDER — FUROSEMIDE 10 MG/ML INJECTION SOLUTION
10 mg/mL | Freq: Once | INTRAVENOUS | Status: CP
Start: 2020-05-27 — End: ?
  Administered 2020-05-27: 19:00:00 10 mL via INTRAVENOUS

## 2020-05-27 MED ORDER — ZZ IMS TEMPLATE
Freq: Every day | ORAL | Status: DC
Start: 2020-05-27 — End: 2020-05-28
  Administered 2020-05-28: 13:00:00 50 mg via ORAL

## 2020-05-27 MED ORDER — SULFAMETHOXAZOLE 800 MG-TRIMETHOPRIM 160 MG TABLET
800-160 mg | ORAL | Status: DC
Start: 2020-05-27 — End: 2020-05-31
  Administered 2020-05-30: 13:00:00 800-160 mg via ORAL

## 2020-05-27 MED ORDER — ESCITALOPRAM 10 MG TABLET
10 mg | Freq: Every evening | ORAL | Status: DC
Start: 2020-05-27 — End: 2020-05-31
  Administered 2020-05-28 – 2020-05-30 (×3): 10 mg via ORAL

## 2020-05-27 MED ORDER — ACETAMINOPHEN 325 MG TABLET
325 mg | Freq: Four times a day (QID) | ORAL | Status: DC | PRN
Start: 2020-05-27 — End: 2020-05-31
  Administered 2020-05-28: 01:00:00 325 mg via ORAL

## 2020-05-27 MED ORDER — FUROSEMIDE 40 MG TABLET
40 mg | Freq: Every day | ORAL | Status: DC
Start: 2020-05-27 — End: 2020-05-29

## 2020-05-27 MED ORDER — SIMETHICONE 80 MG CHEWABLE TABLET
80 mg | Freq: Four times a day (QID) | ORAL | Status: DC | PRN
Start: 2020-05-27 — End: 2020-05-31
  Administered 2020-05-28 – 2020-05-29 (×3): 80 mg via ORAL

## 2020-05-27 MED ORDER — MELATONIN 3 MG TABLET
3 mg | Freq: Every evening | ORAL | Status: DC | PRN
Start: 2020-05-27 — End: 2020-05-31

## 2020-05-27 MED ORDER — TAMSULOSIN 0.4 MG CAPSULE
0.4 mg | Freq: Every evening | ORAL | Status: DC
Start: 2020-05-27 — End: 2020-05-28
  Administered 2020-05-28: 02:00:00 0.4 mg via ORAL

## 2020-05-27 MED ORDER — SODIUM CHLORIDE 0.9 % (FLUSH) INJECTION SYRINGE
0.9 % | Freq: Three times a day (TID) | INTRAVENOUS | Status: DC
Start: 2020-05-27 — End: 2020-05-31
  Administered 2020-05-29 – 2020-05-30 (×3): 0.9 mL via INTRAVENOUS

## 2020-05-27 MED ORDER — ROSUVASTATIN 40 MG TABLET
40 mg | Freq: Every day | ORAL | Status: DC
Start: 2020-05-27 — End: 2020-05-31
  Administered 2020-05-28 – 2020-05-30 (×3): 40 mg via ORAL

## 2020-05-27 MED ORDER — BOLUS IV FLUID
Freq: Once | INTRAVENOUS | Status: CP
Start: 2020-05-27 — End: ?
  Administered 2020-05-28: 02:00:00 500.000 mL/h via INTRAVENOUS

## 2020-05-27 MED ORDER — SODIUM CHLORIDE 0.9 % INTRAVENOUS SOLUTION
INTRAVENOUS | Status: AC
Start: 2020-05-27 — End: ?
  Administered 2020-05-28: 02:00:00 via INTRAVENOUS

## 2020-05-27 MED ORDER — SENNOSIDES 8.6 MG TABLET
8.6 mg | Freq: Two times a day (BID) | ORAL | Status: DC | PRN
Start: 2020-05-27 — End: 2020-05-31

## 2020-05-27 MED ORDER — NICOTINE 7 MG/24 HR DAILY TRANSDERMAL PATCH
7 mg | Freq: Every day | TRANSDERMAL | Status: DC
Start: 2020-05-27 — End: 2020-05-31

## 2020-05-27 MED ORDER — TIOTROPIUM 2.5 MCG-OLODATEROL 2.5 MCG/ACTUATION MIST FOR INHALATION
Freq: Every day | RESPIRATORY_TRACT | Status: DC
Start: 2020-05-27 — End: 2020-05-31

## 2020-05-27 MED ORDER — SODIUM CHLORIDE 0.9 % BOLUS (NEW BAG)
0.9 % | Freq: Once | INTRAVENOUS | Status: CP
Start: 2020-05-27 — End: ?
  Administered 2020-05-27: 16:00:00 0.9 mL/h via INTRAVENOUS

## 2020-05-27 MED ORDER — OXYBUTYNIN CHLORIDE ER 5 MG TABLET,EXTENDED RELEASE 24 HR
5 mg | Freq: Every day | ORAL | Status: DC
Start: 2020-05-27 — End: 2020-05-31
  Administered 2020-05-28 – 2020-05-30 (×3): 5 mg via ORAL

## 2020-05-27 MED ORDER — CARBOXYMETHYLCELLULOSE SODIUM 0.5 % EYE DROPS IN A DROPPERETTE
0.5 % | Freq: Three times a day (TID) | OPHTHALMIC | Status: DC | PRN
Start: 2020-05-27 — End: 2020-05-31

## 2020-05-27 NOTE — Progress Notes
Re: Michaelanthony Kempton AstorinoMRN: [id number] DOB: [birth year]Referring Provider: Wolfgang Phoenix Provider: [facility] Provider Progress NoteChief Complaint: [facility] with Chief Complaint Patient presents with ? Near Syncope History of Present Illness/Oncology History:The patient is a 69 y.o. male who was diagnosed with metastatic NSCLC. He presents to the [facility] for evaluation of diarrhea, pre-syncopal episode, cardiac workup from the [facility] Chest clinic today. While in the [facility], patient is here with his sister. He reports feeling dizzy when he arrived to the chest clinic today, upon ambulating into the clinic he fell. He reports two episodes of diarrhea while in their clinic. He denies other acute complaints. He is unsure if he was recently taking oral abx.  Treatment Plan Information Ainley, Aeson Sawyers   OP Pembrolizumab + Paclitaxel + Carboplatin (AUC 6) x 4 cycles, then Pembrolizumab Maintenance   Current Cycle Treatment Dates Line of Treatment Treatment Goal Treatment Plan Provider Treatment Department Status Auth Status  3 of 7 cycles 02/11/2020 to 09/08/2020 D. First Line Disease Control/Palliate Symptoms Elder Negus, MD [facility] at [address] Active   Created By Created On Updated By Updated On  Elder Negus, MD 01/20/2020  9:58 AM Acquanetta Belling 03/31/2020 10:19 AM  Assessment/Plan/OECC Course:# Diarrhea--Two episodes in chest clinic today --No diarrhea while in the [facility] today# Cardiac Hx--EKG done while in ECC --Trop +, BNP elevated--Given 60mg  IV lasix --Given the above findings in addition with presyncopal episode, the decision was made to admit patient with telemetry monitorECOG performance status at the time of the visit: 2Past Medical History: Diagnosis Date ? Anxiety  ? CAD (coronary artery disease)  ? Diabetes mellitus (HC Code) (HC CODE)  ? Difficult intubation 05/22/2020  Patient with significant soft tissue in oropharynx. Glidescope intubation revealed anterior cords with floppy soft tissue obstruction. Patient will require video laryngoscopy for airway management going forward. ? GERD (gastroesophageal reflux disease)  ? Hyperlipidemia  ? Hypertension  ? Hypertrophy of prostate without urinary obstruction and other lower urinary tract symptoms (LUTS)  ? Other testicular hypofunction  ? Spinal stenosis 09/28/2018 ? Tobacco abuse  ? Vitamin D deficiency  Past Surgical History: Procedure Laterality Date ? CAROTID STENT  08/28/2003 ? CHOLECYSTECTOMY  11/07/2016 ? KNEE SURGERY    torn meniscus ? LAMINECTOMY  10/07/2008 ? LUMBAR DISC SURGERY    herniated disc No Known AllergiesCurrent Outpatient Medications: ?  albuterol sulfate 90 mcg/actuation HFA aerosol inhaler, Inhale 2 puffs into the lungs every 6 (six) hours as needed for wheezing or shortness of breath., Disp: 6.7 g, Rfl: 0?  aspirin 81 MG EC tablet, Take 81 mg by mouth daily., Disp: , Rfl: ?  atorvastatin (LIPITOR) 80 mg tablet, Take 1 tablet (80 mg total) by mouth nightly., Disp: 30 tablet, Rfl: 6?  belladonna alkaloids-opium (B&O SUPPRETTES) 16.2-30 mg suppository, Place 1 suppository rectally 2 (two) times daily as needed for bladder spasm., Disp: 12 suppository, Rfl: 0?  carboxymethylcellulose (REFRESH PLUS) 0.5 % ophthalmic solution in dropperette, Place 1 drop into both eyes 3 (three) times daily as needed., Disp: , Rfl: ?  escitalopram oxalate (LEXAPRO) 10 mg tablet, Take 1 tablet (10 mg total) by mouth nightly., Disp: 90 tablet, Rfl: 3?  famotidine (PEPCID) 20 mg tablet, Take 1 tablet (20 mg total) by mouth daily., Disp: 30 tablet, Rfl: 0?  fexofenadine (ALLEGRA) 180 mg tablet, Take 180 mg by mouth daily as needed. , Disp: , Rfl: ?  furosemide (LASIX) 40 mg tablet,  Take 1 tablet (40 mg total) by mouth daily., Disp: 30 tablet, Rfl: 2?  gabapentin (NEURONTIN) 300 mg capsule, Take 2 capsules (600 mg total) by mouth nightly., Disp: 60 capsule, Rfl: 0?  LORazepam (ATIVAN) 0.5 mg tablet, Take 1 tablet (0.5 mg total) by mouth every 6 (six) hours as needed for anxiety., Disp: 15 tablet, Rfl: 0?  metoprolol succinate XL (TOPROL-XL) 25 mg 24 hr tablet, Take 0.5 tablets (12.5 mg total) by mouth daily. Take with or immediately following a meal., Disp: 30 tablet, Rfl: 0?  multivitamin capsule, Take 1 capsule by mouth daily. , Disp: , Rfl: ?  nicotine (NICODERM CQ) 7 mg transdermal patch, Place 1 patch (7 mg total) onto the skin daily. Alternate arms/sites when applying patch. Discard old patch when applying new one, Disp: 28 patch, Rfl: 0?  oxybutynin XL (DITROPAN-XL) 5 mg 24 hr tablet, Take 1 tablet (5 mg total) by mouth daily., Disp: 90 tablet, Rfl: 0?  oxyCODONE (ROXICODONE) 5 mg Immediate Release tablet, Take 1 tablet (5 mg total) by mouth every 4 (four) hours as needed for up to 5 doses., Disp: 5 tablet, Rfl: 0?  polyethylene glycol (MIRALAX) 17 gram packet, Take 1 packet (17 g total) by mouth daily as needed for constipation. Mix in 8 ounces of water, juice, soda, coffee or tea prior to taking., Disp: 14 each, Rfl: 0?  predniSONE (DELTASONE) 10 mg tablet, Take 13 tablets (130 mg total) by mouth daily for 7 days, THEN 12 tablets (120 mg total) daily for 7 days, THEN 11 tablets (110 mg total) daily for 7 days, THEN 10 tablets (100 mg total) daily for 7 days, THEN 9 tablets (90 mg total) daily for 7 days, THEN 8 tablets (80 mg total) daily for 7 days, THEN 7 tablets (70 mg total) daily for 7 days, THEN 6 tablets (60 mg total) daily for 7 days, THEN 5 tablets (50 mg total) daily for 7 days, THEN 4 tablets (40 mg total) daily for 7 days. Take with food.., Disp: 595 tablet, Rfl: 0?  [START ON 06/28/2020] predniSONE (DELTASONE) 10 mg tablet, Take 3 tablets (30 mg total) by mouth daily for 7 days, THEN 2 tablets (20 mg total) daily for 7 days, THEN 1 tablet (10 mg total) daily for 7 days, THEN 0.5 tablets (5 mg total) daily for 7 days, THEN 0.5 tablets (5 mg total) daily for 7 days. Take with food.., Disp: 49 tablet, Rfl: 0?  senna-docusate (SENNA-PLUS) 8.6-50 mg tablet, Take 1 tablet by mouth nightly. (Patient not taking: Reported on 05/27/2020), Disp: 30 tablet, Rfl: 0?  simethicone (MYLICON) 80 mg chewable tablet, Take 1 tablet (80 mg total) by mouth every 6 (six) hours as needed for flatulence. (Patient not taking: Reported on 05/27/2020), Disp: 30 tablet, Rfl: 0?  sulfamethoxazole-trimethoprim (BACTRIM DS;CO-TRIMOXAZOLE DS) 800-160 mg per tablet, Take 1 tablet by mouth Every Monday, Wednesday, and Friday., Disp: 28 tablet, Rfl: 0No current facility-administered medications for this encounter.Review of Systems Constitutional: Negative for appetite change, chills, diaphoresis, fatigue and fever. HENT:   Negative for mouth sores, sore throat and trouble swallowing.  Respiratory: Negative for cough, shortness of breath and wheezing.  Cardiovascular: Positive for leg swelling. Negative for chest pain. Gastrointestinal: Positive for abdominal pain and diarrhea. Negative for abdominal distention, blood in stool, constipation, nausea and vomiting. Genitourinary: Negative for bladder incontinence, dysuria, frequency and hematuria.  Musculoskeletal: Negative for arthralgias, back pain, gait problem and neck pain. Skin: Negative for itching, rash and wound. Neurological: Negative for  dizziness, extremity weakness, gait problem, headaches, light-headedness and numbness. Hematological: Does not bruise/bleed easily. Psychiatric/Behavioral: Negative for confusion. The patient is not nervous/anxious.  Physical ExamConstitutional:     General: He is not in acute distress.   Appearance: He is not diaphoretic. HENT:    Head: Normocephalic and atraumatic. Eyes: Conjunctiva/sclera: Conjunctivae normal.    Pupils: Pupils are equal, round, and reactive to light. Cardiovascular:    Rate and Rhythm: Normal rate and regular rhythm.    Heart sounds: No murmur heard. No friction rub. Pulmonary:    Effort: Pulmonary effort is normal. No respiratory distress.    Breath sounds: Rhonchi present. No wheezing. Abdominal:    General: Bowel sounds are normal. There is no distension.    Palpations: Abdomen is soft. There is no mass.    Tenderness: There is no abdominal tenderness. There is no guarding. Musculoskeletal:       General: Normal range of motion.    Cervical back: Normal range of motion and neck supple.    Right lower leg: Edema present.    Left lower leg: Edema present. Skin:   General: Skin is warm and dry.    Coloration: Skin is not pale.    Findings: No erythema or rash. Neurological:    Mental Status: He is alert and oriented to person, place, and time. Psychiatric:       Mood and Affect: Affect normal.       Judgment: Judgment normal. OECC Labs/Diagnostic Imaging/Procedures:Invalid input(s): <1>Complete Blood CountResults in Past 7 DaysResult Component Current Result Hematocrit 31.5 (L) (05/27/2020) Hemoglobin 10.6 (L) (05/27/2020) MCH 31.6 (H) (05/27/2020) MCHC 33.7 (05/27/2020) MCV 94.0 (05/27/2020) MPV 9.8 (05/27/2020) Platelets 278 (05/27/2020) RBC 3.4 (L) (05/27/2020) WBC 17.1 (H) (05/27/2020) Imaging impressions last 24 hours:05/27/2020 CXR: Multiple bilateral variable sized pulmonary nodules. No acute radiographic findings.05/27/20 KUB: The bowel gas pattern is nonobstructive. Right upper quadrant surgical clips are noted.Consults called: Medical subspecialty: cardsDischarge Diagnosis:Other ROMIOECC Disposition:Admit-Inpatient floor Massie Kluver, APRN

## 2020-05-27 NOTE — Telephone Encounter
Chart reviewed.  Patient with active cancer.  Patient will be better served for his low back pain at [facility] anesthesia service pain management.  Please inform the patient.  Please have patient reach out to PCP or oncologist for a referral.

## 2020-05-27 NOTE — Progress Notes
During today's visit patient reported low BP, BP taken in office 103/64 left arm sitting, 100/62 right arm sititng- taken by RN.Pt denied CP, SOB, nausea and vomiting, however endorsed dizziness and severe abdominal cramping.Pt also reported to RN that he had a fall today in the [employer] parking lot - denied hitting his head or injury/ pain, landed on his right knee and rolled. Pt uses a cane to ambulate and drove himself into clinic today for his visit.Due to patient current symptoms and fall - MD advised patient be seen in [facility], RN advised patient not to drive given his dizziness, low BP and fall, patient agreed and called his sister. MD agreeable pt does not need ambulance, patient safely brought down from [place] to his sister's car, pt being transported via private car with his sister to the [facility] for evaluation. BP 103/64 (Site: l a, Position: Sitting, Cuff Size: Large)  - Pulse (!) 94  - Resp (!) 23  - SpO2 98%

## 2020-05-27 NOTE — Telephone Encounter
Redcvd call from [place]. Says that Paul Hunter saw Dr. Londell Moh, got dizzy and fell in the parking lot . Dr. Amada Jupiter wants him to go to the [facility].they told her to call our office to set up.[name]

## 2020-05-27 NOTE — Telephone Encounter
Spoke with patient's sister [name]. She states that appointment was scheduled for 10/13 but rescheduled without any notification. Apologized for this. Per Lisabeth Devoid was just d/c'ed from the [facility].  She suggests reaching out to Dr. Layla Barter for updated referral.

## 2020-05-27 NOTE — Telephone Encounter
Called and spoke to patient sister [name], to advise of appointment scheduled for 06/30/20 @ 4:30p with Dr. Malen Gauze. Sister verbalized understanding.

## 2020-05-27 NOTE — Progress Notes
Comprehensive Pulmonary Medicine ProgramPCCSM Fellows' ClinicSection of Pulmonary, Critical Care and Sleep MedicineWinchester Center for [facility], [facility] [address], [place] [phone], fax [phone]?Patient Name:  Paul Daniello AstorinoDate of Birth:  November 23, 1952EPIC MRN:  [id number] Date of Service: 05/27/20?I had the pleasure of meeting Paul Hunter in the [facility] today in follow up for shortness of breath as requested by Deidre Ala, APRN.                History of Present Illness:This is a 69 y.o.-year-old male with a chief complaint of dyspnea and pulmonary history notable for Stage IV Non-Small Cell Lung Cancer on who was on chemo and immunotherapy complicated by checkpoint inhibitor myocarditis, tobacco use disorder, and obstructive sleep apnea on CPAP.  Additionally he has a history of coronary artery disease with myocardial infarction, and a recent positive stress test.Interim History:Paul Hunter was last seen on July 30th at [facility] by myself and Dr. Dell Ponto.  At that time there was a question of immune checkpoint inhibitor pneumonitis causing his dyspnea, but a cardiac condition was considered to be the most likely.  The plan was to get serial [place] imaging to see if the ground-glass opacities increased.  He was seen by thoracic oncology and they decided to hold his immunotherapy due to ongoing dyspnea.  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  He also saw cardio oncology, and due to his past history of positive stress test and a concern for her dyspnea is an anginal equivalent they did a PET stress as well as cardiac MRI for immune checkpoint inhibitor myocarditis.  His PET stress showed 2 reversible perfusion defects which were large sized, moderate to severe intensity, and reversible.  He had also a drop in his EF on his cardiac MRI from normal to 44%.  He was placed on treatment dose of prednisone for immune checkpoint inhibitor myocarditis.  He was seen in thoracic Oncology and had worsening of his heart failure symptoms with a 30 lb weight gain in 10 days, and worsening dyspnea.  Due to the worsening of his clinical condition he was advised to be admitted to the hospital.He was admitted from 05/09/20-05/12/20.  He was diuresed inpatient and was net -7 L. he was on oxygen during his hospital stay but was weaned off after diuresis.Unfortunately the patient developed gross hematuria, and was seen in urology clinic.  He needed to be admitted for cystoscopy and clot evacuation.  He was seen in the [facility] due to Foley catheter clogging with clots.  During hospital stay pulmonology was consulted for an EBUS to confirm that squamous cell carcinoma was truly the lung primary rather than a metastasis from the bladder.  EBUS was performed on 9/28 and pathology was sent.  Findings during the EBUS were that the nodes were small and biopsies were difficult to obtain.  They sent the samples for cytology and recommended [place]-guided transthoracic biopsy if the samples were insufficient.[place] chest was obtained on 09/21 and showed interval progression of innumerable lung metastasis.Today the patient drove to the clinic, and when walking in from his car felt lightheaded and had a presyncopal episode.  He fell in the parking lot.  Denied any trauma, that he was able to lower himself down and roll, denied hitting his head or losing consciousness.  While in the visit today he had mild abdominal pain and 2 episodes of profuse diarrhea.  He  stated that the diarrhea was watery and nonbloody.  He denies any recent fevers.  Stated that he has recently been constipated aside from this morning.With regard to his dyspnea, since he has been on diuretics and the prednisone his dyspnea has improved significantly.  He felt overwhelmed today by all of the recent medical complications he has had.  Stated that he feels ?tired? and that his ?timeline has shortened.  He denied any depression or suicidal ideation.Review of Systems: All other systems were reviewed and are negative.?Past Medical History:Relevant PMH is as discussed in initial consult and HPI, and was otherwise reviewed in EMR.?Social History: He currently smokes 1PPD with approximately 55 total pack years. There is no significant alcohol or drug use history.Family History:Family history was reviewed in EMR.?Allergies/ADRs:He has No Known Allergies.?Medications:He has a current medication list which includes the following prescription(s): aspirin, atorvastatin, escitalopram oxalate, gabapentin, multivitamin, prochlorperazine, tiotropium, tramadol, tumeric-ging-olive-oreg-capryl, and albuterol sulfate. ?Physical Examination:Temp:    Pulse: (!) 94  RR: (!) 23 BP: 103/64 (feels dizzy)  O2 sat: 98 % (room air)On room airGen: Obese male in NADHEENT: MMMCV: clear s1s2, no mrgsPulm: good air movement, some prolonged expiration.  Anterior wheezing.Ext:  2+ edema to the ankles?Pulmonary Function Tests:? FEV1/FVC FVC FEV1 TLC RV/TLC DLCOHb FeNO 03/22/2020 75% 94% 93% 84% 86% 68% NA Personal Interpretation with attending MD: Normal spirometry. Normal TLC. Reduced ERV which may be due to body habitus. Mild decrease in diffusion capacity. ?Diagnostic Imaging: (interval imaging was personally reviewed with attending physician)CTA Chest 7/23/21IMPRESSION:No evidence of pulmonary embolism. Mild peribronchovascular groundglass is similar to the prior study, likely inflammatory, without evidence of new groundglass or consolidation.Stable size of pulmonary metastases, several of which have increased central cavitation since June 2021. Stable mediastinal/hilar lymphadenopathy.[place] Chest without IV contrast?Clinical information: Stage IV squamous cell carcinoma of the lung?Comparison: 03/26/2020?The study was performed without intravenous contrast.?FINDINGS:?LUNGS AND AIRWAYS: There is mild upper lobe emphysema and diffuse bronchial wall thickening. Mild secretions are seen within the trachea.?Innumerable bilateral lung metastases have developed or progressed since the previous study, for example the largest lesion is situated posteriorly at the right lung base measuring up to 2.3 cm, previously 1.6 cm. Many of the lesions have central cavitation.?PLEURA: There are no pleural effusions.?LYMPH NODES/MEDIASTINUM: There are multiple mediastinal, hilar and left axillary/subpectoral lymph nodes with a mixed interval change, for example a right paratracheal lymph node measures 2.1 cm in short axis, previously 1.8 cm, and subcarinal adenopathy measures 8 mm in short axis, previously 1.2 cm. The esophagus is normal in appearance.?CHEST WALL: Negative ?THYROID AND LOWER NECK: Negative?CARDIOVASCULAR: There is stable dilatation of the ascending thoracic aorta and enlargement of the central pulmonary arteries. There is coronary artery calcification and stenting. Prominent subendocardial fat in the left ventricle may be related to prior myocardial infarction. There is no pericardial effusion. ?UPPER ABDOMEN: Limited evaluation of the upper abdomen is unremarkable.?MUSCULOSKELETAL: There are no suspicious osseous lesions.?IMPRESSION:?1. Interval progression of innumerable lung metastases.?2. Mixed interval change with respect to adenopathy, overall without significant change.?Laboratory Data:Notable for Cr 1.59 Calcium 8.5Otherwise interval data reviewed in EMR.Pathology:05/22/20 BladderFINAL DIAGNOSIS 1. BLADDER, LEFT LATERAL WALL AND TRIGONE, TRANSURETHRAL RESECTION: ? ? ?- INVASIVE PAPILLARY UROTHELIAL CARCINOMA, HIGH GRADE ? ? ?- TUMOR INVADES INTO LAMINA PROPRIA ? ? ?- MUSCULARIS PROPRIA IDENTIFIED BUT NOT INVOLVED BY TUMOR 2. LEFT URETER TUMOR, BIOPSY: ? ? ?- SUSPICIOUS FOR HIGH GRADE UROTHELIAL CARCINOMA (SEE NOTE)EBUS 9/28/21Cytology pending?Impression and Recommendations:This is a 69 y.o.-year-old male with a chief complaint of dyspnea and pulmonary history notable  for Stage IV Non-Small Cell Lung Cancer on who was on chemo and immunotherapy complicated by checkpoint inhibitor myocarditis, tobacco use disorder, and obstructive sleep apnea on CPAP. With regard to his dyspnea his symptoms have significantly improved while on diuretics.  Additionally there are no ground-glass opacities on [place] suggesting that he does not have pneumonitis.  His [facility] scan shows interval progression of multiple nodules/masses and now the question is raised of whether this is metastatic bladder cancer rather than primary lung cancer, or both.  His recent bladder biopsy showed urothelial carcinoma.  He had an EBUS this week but the results are not back yet from the cytology.  If the cytology is negative he will likely need a transthoracic [place]-guided biopsy by Interventional Radiology.We talked about the emotional distress caused by all of his medical problems, recent hospitalizations, and symptoms.  Theodoro Grist is obviously going through quite a bit and has significant family support.  With all this going on a palliative care consultation is appropriate to help symptom management.  Theodoro Grist was in agreement with this.He is on albuterol and Spiriva for his lung disease.  Unfortunately, the Spiriva but is expensive, up to 500 dollars per month.  We will send his prescription to the [facility] for 340B pricing.During this checkup visit, he had lightheadedness, and a presyncopal episode in the parking lot.  Additionally, he had multiple episodes of diarrhea while in clinic with some associated lower abdominal pain.  Given this acute episode, we advised that heshould be evaluated in the ECC.  He was in agreement with this.  I called to sign out to the [facility] providers.  The differential for his lightheadedness and presyncope could be volume depletion from over-diuresis and diarrhea.  Additionally his diarrhea may be infectious in etiology.  C diff should be tested while at the [facility] if they deem appropriate.   ?Recommendations:- Sending patient to [facility] for an evaluation of lightheadedness and diarrhea.- Continue albuterol and Spiriva (will send spiriva to Apothecary)- F/U EBUS results, may need transthoracic biopsy (will defer to oncology for this decision)- Palliative care referral made- Continue to follow with smoking cessation clinic through [facility]?Routine Health Screening:?	Tobacco counseling: patient counseled and connected in smoking cessation clinic?	Lung cancer screening: diagnosed with [facility] 2021?	Immunization administration: COVID vaccine x 2 2021, needs pneumococcal?It is a pleasure to participate in the care of this patient. Patient will follow up at the [facility] for Lung Disease in 2 months. ?Drinda Butts, MDFellow, Pulmonary, Critical Care, and Sleep MedicineElectronically Signed by Drinda Butts, MD, March 25, 2020?Attending Addendum [facility]):I have seen and discussed Elmon Else Fleissner with Dr. Quillian Quince and agree with the assessment and plan as noted.  He is here for follow-up of his dyspnea on exertion which has improved with diuresis.  He was never able to use Spiriva consistently so it is unclear if it was helpful.  We will provide it using 340b pricing, if he does not feel better with consistent use, then he can stop it.  He is lightheaded today which I suspect is the consequence of diarrhea on a background of diuresis.  We sent him in to the [facility] for evaluation.  His [place] shows progression of diffuse bilateral pulmonary mets.  I agree with the prior impression that this may be from an non-pulmonary primary (bladder) and multiple lesions seem accessible via trans thoracic biopsy.

## 2020-05-27 NOTE — Telephone Encounter
Message sent to Dr. Layla Barter requesting referral. No further action required by Dr. Margaretann Loveless.

## 2020-05-27 NOTE — Telephone Encounter
RTC to [place].  Left VM to call us back.  Contacted Dr. Quillian Quince in [facility] who saw the patient this morning.  Contacted Gabriel Rung, APRN to update her on the situation.  Dr. Quillian Quince to call Jae Dire to give report and update on patient.  Will determine the best place for the patient to be further evaluated.

## 2020-05-28 ENCOUNTER — Inpatient Hospital Stay: Admit: 2020-05-28 | Payer: PRIVATE HEALTH INSURANCE

## 2020-05-28 DIAGNOSIS — R55 Syncope and collapse: Secondary | ICD-10-CM

## 2020-05-28 LAB — CBC WITH AUTO DIFFERENTIAL
BKR CALCIUM: 15.1 % — ABNORMAL HIGH (ref 11.5–14.5)
BKR WAM ABSOLUTE IMMATURE GRANULOCYTES: 0.2 x 1000/??L (ref 0.0–0.3)
BKR WAM ABSOLUTE LYMPHOCYTE COUNT: 1.9 x 1000/??L (ref 1.0–4.0)
BKR WAM ABSOLUTE NRBC: 0 x 1000/??L (ref 0.0–0.0)
BKR WAM ANALYZER ANC: 11.4 x 1000/ÂµL — ABNORMAL HIGH (ref 1.0–11.0)
BKR WAM BASOPHIL ABSOLUTE COUNT: 0 x 1000/??L (ref 0.0–0.0)
BKR WAM BASOPHILS: 0.1 % (ref 0.0–4.0)
BKR WAM EOSINOPHIL ABSOLUTE COUNT: 0 x 1000/??L (ref 0.0–1.0)
BKR WAM EOSINOPHILS: 0.2 % (ref 0.0–7.0)
BKR WAM HEMATOCRIT: 31.2 % — ABNORMAL LOW (ref 37.0–52.0)
BKR WAM HEMOGLOBIN: 10.3 g/dL — ABNORMAL LOW (ref 12.0–18.0)
BKR WAM IMMATURE GRANULOCYTES: 1.3 % (ref 0.0–3.0)
BKR WAM LYMPHOCYTES: 13.5 % (ref 8.0–49.0)
BKR WAM MCH (PG): 31.4 pg — ABNORMAL HIGH (ref 27.0–31.0)
BKR WAM MCHC: 33 g/dL (ref 31.0–36.0)
BKR WAM MCV: 95.1 fL — ABNORMAL HIGH (ref 78.0–94.0)
BKR WAM MONOCYTE ABSOLUTE COUNT: 0.7 x 1000/??L (ref 0.0–2.0)
BKR WAM MONOCYTES: 4.9 % (ref 4.0–15.0)
BKR WAM MPV: 9.5 fL (ref 6.0–11.0)
BKR WAM NEUTROPHILS: 80 % (ref 37.0–84.0)
BKR WAM NUCLEATED RED BLOOD CELLS: 0 % (ref 0.0–1.0)
BKR WAM PLATELETS: 228 x1000/??L (ref 140–440)
BKR WAM RDW-CV: 15.1 % — ABNORMAL HIGH (ref 11.5–14.5)
BKR WAM RED BLOOD CELL COUNT: 3.3 M/ÂµL — ABNORMAL LOW (ref 3.8–5.9)
BKR WAM WHITE BLOOD CELL COUNT: 14.3 x1000/??L — ABNORMAL HIGH (ref 4.0–10.0)

## 2020-05-28 LAB — BASIC METABOLIC PANEL
BKR ANION GAP: 10 (ref 7–17)
BKR BLOOD UREA NITROGEN: 27 mg/dL — ABNORMAL HIGH (ref 8–23)
BKR BUN / CREAT RATIO: 16.8 (ref 8.0–23.0)
BKR CHLORIDE: 104 mmol/L (ref 98–107)
BKR CO2: 28 mmol/L — ABNORMAL LOW (ref 20–30)
BKR CREATININE: 1.61 mg/dL — ABNORMAL HIGH (ref 0.40–1.30)
BKR EGFR (AFR AMER): 52 mL/min/1.73m2 (ref >60)
BKR EGFR (NON AFRICAN AMERICAN): 43 mL/min/{1.73_m2} — ABNORMAL HIGH (ref >60)
BKR GLUCOSE: 101 mg/dL — ABNORMAL HIGH (ref 70–100)
BKR POTASSIUM: 3.8 mmol/L (ref 3.3–5.1)
BKR SODIUM: 142 mmol/L (ref 136–144)

## 2020-05-28 LAB — STOOL PATHOGENS BY PCR (BH GH LMW YH)
BKR CAMPYLOBACTER PCR: NOT DETECTED
BKR SALMONELLA PCR: NOT DETECTED
BKR SHIGA TOXIN 1 PCR: NOT DETECTED
BKR SHIGA TOXIN 2 PCR: NOT DETECTED
BKR SHIGELLA PCR: NOT DETECTED
BKR VIBRIO PCR: NOT DETECTED
BKR YERSINIA PCR: NOT DETECTED

## 2020-05-28 LAB — LIVER FUNCTION TESTS     (YH)
BKR ALANINE AMINOTRANSFERASE (ALT): 45 U/L (ref 9–59)
BKR ALKALINE PHOSPHATASE: 70 U/L (ref 9–122)
BKR ASPARTATE AMINOTRANSFERASE (AST): 13 U/L (ref 10–35)
BKR AST/ALT RATIO: 0.3
BKR BILIRUBIN DIRECT: <0.2 mg/dL (ref <=0.3)
BKR BILIRUBIN TOTAL: 0.4 mg/dL (ref <=1.2)

## 2020-05-28 LAB — TROPONIN T     (Q)
BKR TROPONIN T: 0.06 ng/mL — ABNORMAL HIGH (ref <0.01)
BKR TROPONIN T: 0.05 ng/mL — ABNORMAL HIGH (ref <0.01)

## 2020-05-28 LAB — PHOSPHORUS     (BH GH L LMW YH): BKR PHOSPHORUS: 3.1 mg/dL (ref 2.2–4.5)

## 2020-05-28 LAB — MAGNESIUM: BKR MAGNESIUM: 2 mg/dL (ref 1.7–2.4)

## 2020-05-28 MED ORDER — LACTATED RINGERS IV BOLUS (NEW BAG)
Freq: Once | INTRAVENOUS | Status: CP
Start: 2020-05-28 — End: ?
  Administered 2020-05-28: 14:00:00 500.000 mL/h via INTRAVENOUS

## 2020-05-28 MED ORDER — IOHEXOL 350 MG IODINE/ML INTRAVENOUS SOLUTION
350 mg iodine/mL | Freq: Once | INTRAVENOUS | Status: CP | PRN
Start: 2020-05-28 — End: ?
  Administered 2020-05-28: 05:00:00 350 mL via INTRAVENOUS

## 2020-05-28 MED ORDER — TAMSULOSIN 0.4 MG CAPSULE
0.4 mg | Freq: Every evening | ORAL | Status: DC
Start: 2020-05-28 — End: 2020-05-31
  Administered 2020-05-29 – 2020-05-30 (×2): 0.4 mg via ORAL

## 2020-05-28 MED ORDER — METHYLPREDNISOLONE 1000 MG MBP
Freq: Every day | INTRAVENOUS | Status: CP
Start: 2020-05-28 — End: ?
  Administered 2020-05-28 – 2020-05-30 (×3): 100.000 mL/h via INTRAVENOUS

## 2020-05-28 MED ORDER — ENOXAPARIN 40 MG/0.4 ML SUBCUTANEOUS SYRINGE
40 mg/0.4 mL | Freq: Two times a day (BID) | SUBCUTANEOUS | Status: DC
Start: 2020-05-28 — End: 2020-05-31
  Administered 2020-05-29 (×2): 40 mg/0.4 mL via SUBCUTANEOUS

## 2020-05-28 MED ORDER — SODIUM CHLORIDE 0.9 % BOLUS (NEW BAG)
0.9 % | Freq: Once | INTRAVENOUS | Status: CP | PRN
Start: 2020-05-28 — End: ?
  Administered 2020-05-28: 05:00:00 0.9 mL/h via INTRAVENOUS

## 2020-05-28 MED ORDER — LISINOPRIL 2.5 MG TABLET
2.5 mg | Freq: Every day | ORAL | Status: SS
Start: 2020-05-28 — End: 2020-06-23

## 2020-05-28 NOTE — Plan of Care
Inpatient Physical Therapy Evaluation     IP Adult PT Eval/Treat - 05/28/20 1310          Date of Visit / Treatment    Date of Visit / Treatment 05/28/20     Note Type Evaluation     Progress Report Due 06/11/20     End Time 1310        General Information    Pertinent History Of Current Problem Per chart 69 y.o. 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, on prednisone), OSA on CPAP, recently diagnosed bladder cancer who presented today after an episode of near-syncope upon exiting his car for a pulm appointment today likely iso orthostatic hypotension 2/2 GI losses, diuresis     Subjective If I could get my shots, I'd be able to walk better     General Observations Pt seated EOB, RA, NAD, agreeable to therapy, cleared by RN, PIV     Precautions/Limitations fall precautions;bed alarm;chair alarm     Fall History yes, reason for admission   controlled descend, no head strike       Weight Bearing Status    Weight Bearing Status WNL - Within normal limits        Prior Level of Functioning/Social History    Additional Comments Per pt and chart, lives alone in an in law apt attached to his nieces home. + 24 hr support from niece and other family members. Primarily uses a SC for ambulation at baseline. + 1 FOS to apt with rail. (I) with ADLs, mobility, and IADLs at baseline. 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, unable to get injections due to continued hospitalizations, positioned to comfort at end        Patient Coping    Observed Emotional State accepting;cooperative        Cognition    Overall Cognitive Status WFL     Orientation Level Oriented X4     Level of Consciousness alert     Following Commands Follows one step commands without difficulty     Personal Safety / Judgment Fall risk;Requires cues/assistance to recognize deficits;Requires cuing/assistance to correct errors made;Decreased recognition / insight of own deficits     Cognition Comments Cues for safety, pt reaching for walls/objects while ambulating        Range of Motion    Range of Motion Examination bilateral upper extremity ROM was WFL;bilateral lower extremity ROM was [facility]        Manual Muscle Testing    Manual Muscle Testing Comments Deconditioned grossly        Musculoskeletal    LUE Muscle Strength Grading 5-->normal muscle strength     RUE Muscle  Strength Grading 5-->normal muscle strength     LLE Muscle Strength Grading 5-->normal muscle strength   except hips 4/5    RLE Muscle Strength Grading 5-->normal muscle strength   except hips 4/5       Muscle Tone    Muscle Tone Testing Results No muscle tone deficits noted        Coordination    Opposition Impaired bilaterally     Fine Motor Coordination Pt describes difficulty with fine motor activities (ie writing), educated on use of built up writing utensils with verbalized understanding        Sensory Assessment    Sensory Tests Results Chronic neuropathy     Sensory Assessment Comments B UE/LE, intact to LT grossly but decreased in B hands/feet, denied decreased kinesthesia and proprioception        Skin Assessment    Skin Assessment See Nursing Documentation        Posture, Head/Trunk Alignment    Posture, Head/Trunk Alignment Maintains midline;Forward head        Balance    Sitting Balance: Static  GOOD     Maintains static position against moderate resistance with no Assistive Device     Sitting Balance: Dynamic  GOOD-   Independent in dynamic balance activities (ambulates on level surfaces with no Assistive Device, picks up object from floor in sitting)     Standing Balance: Static FAIR+     Maintains static position without assist or device, may require Supervision or Verbal Cues (>2 minutes)     Standing Balance: Dynamic  FAIR      Performs dynamic activities through 75% range Contact Guard or partial range (50-75%) with Supervision     Balance Assist Device IV pole;Hand held assist     Balance Skills Training Comment Pt reaching out for wall/objects at times, discouraged and educated on fall risk and safety        Bed Mobility    Bed Mobility Comments EOB start/end of session        Sit-Stand Transfer Training    Sit-to-Stand Transfer Independence/Assistance Level Contact guard     Sit-to-Stand Transfer Assist Device IV pole     Stand-to-Sit Transfer Independence/Assistance Level Contact guard     Stand-to-Sit Transfer Assist Device IV pole     Sit-Stand Transfer Comments Overall steady        Gait Training    Independence/Assistance Level  Contact guard     Assistive Device  IV pole;Hand held assist     Gait Distance 20 feet   x 5    Gait Analysis Deviations decreased cadence;decreased step length;decreased stride length     Gait Training Comments Pt slightly unsteady at times with no overt LOB, reaching out for wall/object occassionally, education provided, cues for breathing technique, pacing, and rest breaks 2/2 to inc'd WOB noted, fatigues easily        Stair Performance    Stair Performance Comment Will f/u as appropriate; pt declined 2/2 to fatigue, agreeable to trialing next session        Handoff Documentation    Handoff Patient in bed;Bed alarm;Patient instructed to call nursing for mobility;Discussed with nursing        Endurance    Endurance Comments fair        PT- AM-PAC - Basic Mobility Screen- How much help from another person do you currently need.....    Turning from your back to your side while in a a flat bed without using rails?  3 - A Little - Requires a little help (supervision, minimal assistance). Can use assistive devices.     Moving from lying on your back to sitting on the side of a flat bed without using bed rails? 3 - A Little - Requires a little help (supervision, minimal assistance). Can use assistive devices.     Moving to and from a bed to a chair (including a wheelchair)? 3 - A Little - Requires a little help (supervision, minimal assistance). Can use assistive devices.     Standing up from a chair using your arms(e.g., wheelchair or bedside chair)? 3 - A Little - Requires a little help (supervision, minimal assistance). Can use assistive devices.     To walk in a hospital room? 3 - A Little - Requires a little help (supervision, minimal assistance). Can use assistive devices.     Climbing 3-5 steps with a railing? 2 - A Lot - Requires a lot of help (maximum to moderate assistance). Can use assistive devices.     AMPAC Mobility Score 17     TARGET Highest Level of Mobility Mobility Level 5, Stand for 1 minute        Therapeutic Exercise    Therapeutic Exercise Comments AROM x 4 EOB, educated and encouraged        Neuromuscular Re-education    Neuromuscular Re-education Comments Seated/standing balance static/dynamic        Clinical Impression    Initial Assessment Pt presents to PT with impairments in balance, strength, activity tolerance, and endurance. Pt requires A x 1 with HHA/IV pole for mobility, fatigues easily and slightly unsteady at times with no overt LOB. Recommend home with home PT and 24 hr family support/assist pending stairs        Patient/Family Stated Goals    Patient/Family Stated Goal(s) return home        Frequency/Equipment Recommendations    PT Frequency 3x per week     What day of week is next treatment expected? Monday     Equipment Needs During Admission/Treatment Hand held assist;IV pole        PT Recommendations for Inpatient Admission    Activity/Level of Assist ambulate;assist of 1;with hand held assist;with straight cane   Per pt, family is bringing his cane       Planned Treatment / Interventions    Plan for Next Visit Trial RW, trial stairs        PT Discharge Summary    Physical Therapy Disposition Recommendation Home   pending stairs    Additional Physical Therapy Disposition Recommendations Home Physical Therapy;Home with 24 hour assistance;Home with family support     Equipment Recommendations for Discharge --   ? need for RW                  Problem: Physical Therapy Goals  Goal: Physical Therapy Goals  Description: PT GOALS  1. Patient will perform bed mobility independently  2. Patient will perform transfers with least assistive device with modified independence  3. Patient will ambulate a minimum of 150 feet with least assistive device with modified independence  4. Patient will ascend and descend at least 1 flight of steps with modified independence    Outcome: Initial problem identification     Drema Halon, PT, DPT     Department of Rehabilitation Services  [facility]   [address]  [place], [place] [address]    Office: [phone]  MHB:  [phone]

## 2020-05-28 NOTE — Other
CARDIOLOGY CONSULT NOTE  Attending formulation (see also my note below):  38m admitted yesterday with presyncope in setting episodes diarrhea and abdominal pain.  We are consulted due to rising troponin.Medical issues include NSCLC iv on carbo/taxol/pembro; recently dx'ed bladder cancer after episodes hematuria related to clopidogrel initiation--9/24 cystoscopy/fulguration; known CAD/PCIs with recent PET documented ischemia; obese; OSA/CPAP.  Feels relatively well and is lying flat comfortably.  ECG with mild nonspecific ST-T changes and one VPC.  Cr a bit elevated and troponins flat but elevated as well.  An echocardiogram earlier this month revealed normal systolic function.  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  Worsening ischemia is certainly a primary or contributing possibility as well.  The patient is hesitant to resume clopidogrel, ?I took that pill an 8 hours later it was a disaster.?  He feels it cause the hematuria and everything that followed.  We discussed the fact that his bladder cancer is now treated.  For the moment it is okay to avoid this medication.  A limited echo for LV function will be helpful.    Thanks...  Konrad Dolores    =========================================================================   Consulting Attending Physician: Truman Hayward, MD  Admission Diagnosis: Chest pain, rule out acute myocardial infarction [R07.9]  History provided by: the patient  History limited by: no limitations    Primary Cardiologist: Dr. Romie Levee  =========================================================================   REASON FOR CONSULT  Elevated troponins in the setting of check point inhibitor myocarditis.     HISTORY OF PRESENT ILLNESS:  69 y.o.?M?with??current tobacco use (1ppd now,?1-2ppd for 50 years, tried to quit?multiple times),?HTN,?OSA on CPAP,?history of CAD s/p MIx2?1996 (1 stent at [facility] area), 2014 ?(3 stents in [facility] hospital at [facility]) and PCI?x4?(on review of [place] chest, likely RCA and LAD stents), HFrEF??t with recovery?of LVEF? (LVEF 68%), stage IV NSCLC; underwent recent PET stress 05/06/20 after presenting with ADHF?that was high risk, w/+TID, 2 large reversible perfusion defects, LVEF of 40% at rest, drop to 36% on stress?with [place] chest showing possible progression of disease. Plavix started cautiously at that time to see if he would tolerate  DAPT with his hematuria. He presents to the hospital due to orthostatic hypotension, now s/p IV fluids and cardiology is consulted due to persistently elevated troponins from his baseline.     He reports that he is doing very well. He denies any SOB, chest pains, palpitations or dizziness.     PMH PSH   Past Medical History:   Diagnosis Date   ? Anxiety    ? CAD (coronary artery disease)    ? Diabetes mellitus (HC Code) (HC CODE)    ? Difficult intubation 05/22/2020    Patient with significant soft tissue in oropharynx. Glidescope intubation revealed anterior cords with floppy soft tissue obstruction. Patient will require video laryngoscopy for airway management going forward.   ? GERD (gastroesophageal reflux disease)    ? Hyperlipidemia    ? Hypertension    ? Hypertrophy of prostate without urinary obstruction and other lower urinary tract symptoms (LUTS)    ? Other testicular hypofunction    ? Spinal stenosis 09/28/2018   ? Tobacco abuse    ? Vitamin D deficiency     Past Surgical History:   Procedure Laterality Date   ? CAROTID STENT  08/28/2003   ? CHOLECYSTECTOMY  11/07/2016   ? KNEE SURGERY      torn meniscus   ? LAMINECTOMY  10/07/2008   ?  LUMBAR DISC SURGERY      herniated disc      Social History Family History   Social History     Tobacco Use   ? Smoking status: Current Every Day Smoker     Types: Cigarettes   Substance Use Topics   ? Alcohol use: Not on file   ? Drug use: Not on file     Social History     Substance and Sexual Activity   Drug Use Not on file    No family history on file.       Prior to Admission Medications   Prior to Admission Medication   Medication Sig Dispense Refill   ? tiotropium (SPIRIVA WITH HANDIHALER) 18 mcg capsule for inhalation Inhale 18 mcg into the lungs daily.     ? albuterol sulfate 90 mcg/actuation HFA aerosol inhaler Inhale 2 puffs into the lungs every 6 (six) hours as needed for wheezing or shortness of breath. 6.7 g 0   ? aspirin 81 MG EC tablet Take 81 mg by mouth daily.     ? atorvastatin (LIPITOR) 80 mg tablet Take 1 tablet (80 mg total) by mouth nightly. 30 tablet 6   ? belladonna alkaloids-opium (B&O SUPPRETTES) 16.2-30 mg suppository Place 1 suppository rectally 2 (two) times daily as needed for bladder spasm. 12 suppository 0   ? carboxymethylcellulose (REFRESH PLUS) 0.5 % ophthalmic solution in dropperette Place 1 drop into both eyes 3 (three) times daily as needed.     ? escitalopram oxalate (LEXAPRO) 10 mg tablet Take 1 tablet (10 mg total) by mouth nightly. 90 tablet 3   ? famotidine (PEPCID) 20 mg tablet Take 1 tablet (20 mg total) by mouth daily. 30 tablet 0   ? fexofenadine (ALLEGRA) 180 mg tablet Take 180 mg by mouth daily as needed.      ? furosemide (LASIX) 40 mg tablet Take 1 tablet (40 mg total) by mouth daily. 30 tablet 2   ? gabapentin (NEURONTIN) 300 mg capsule Take 2 capsules (600 mg total) by mouth nightly. 60 capsule 0   ? LORazepam (ATIVAN) 0.5 mg tablet Take 1 tablet (0.5 mg total) by mouth every 6 (six) hours as needed for anxiety. 15 tablet 0   ? metoprolol succinate XL (TOPROL-XL) 25 mg 24 hr tablet Take 0.5 tablets (12.5 mg total) by mouth daily. Take with or immediately following a meal. 30 tablet 0   ? multivitamin capsule Take 1 capsule by mouth daily.      ? nicotine (NICODERM CQ) 7 mg transdermal patch Place 1 patch (7 mg total) onto the skin daily. Alternate arms/sites when applying patch. Discard old patch when applying new one 28 patch 0   ? oxybutynin XL (DITROPAN-XL) 5 mg 24 hr tablet Take 1 tablet (5 mg total) by mouth daily. 90 tablet 0   ? oxyCODONE (ROXICODONE) 5 mg Immediate Release tablet Take 1 tablet (5 mg total) by mouth every 4 (four) hours as needed for up to 5 doses. 5 tablet 0   ? polyethylene glycol (MIRALAX) 17 gram packet Take 1 packet (17 g total) by mouth daily as needed for constipation. Mix in 8 ounces of water, juice, soda, coffee or tea prior to taking. 14 each 0   ? predniSONE (DELTASONE) 10 mg tablet Take 13 tablets (130 mg total) by mouth daily for 7 days, THEN 12 tablets (120 mg total) daily for 7 days, THEN 11 tablets (110 mg total) daily for 7 days, THEN 10 tablets (  100 mg total) daily for 7 days, THEN 9 tablets (90 mg total) daily for 7 days, THEN 8 tablets (80 mg total) daily for 7 days, THEN 7 tablets (70 mg total) daily for 7 days, THEN 6 tablets (60 mg total) daily for 7 days, THEN 5 tablets (50 mg total) daily for 7 days, THEN 4 tablets (40 mg total) daily for 7 days. Take with food.. 595 tablet 0   ? [START ON 06/28/2020] predniSONE (DELTASONE) 10 mg tablet Take 3 tablets (30 mg total) by mouth daily for 7 days, THEN 2 tablets (20 mg total) daily for 7 days, THEN 1 tablet (10 mg total) daily for 7 days, THEN 0.5 tablets (5 mg total) daily for 7 days, THEN 0.5 tablets (5 mg total) daily for 7 days. Take with food.. 49 tablet 0   ? senna-docusate (SENNA-PLUS) 8.6-50 mg tablet Take 1 tablet by mouth nightly. (Patient not taking: Reported on 05/27/2020) 30 tablet 0   ? simethicone (MYLICON) 80 mg chewable tablet Take 1 tablet (80 mg total) by mouth every 6 (six) hours as needed for flatulence. (Patient not taking: Reported on 05/27/2020) 30 tablet 0   ? sulfamethoxazole-trimethoprim (BACTRIM DS;CO-TRIMOXAZOLE DS) 800-160 mg per tablet Take 1 tablet by mouth Every Monday, Wednesday, and Friday. 28 tablet 0        Current Medications   Scheduled Meds:  Current Facility-Administered Medications   Medication Dose Route Frequency Provider Last Rate Last Admin   ? aspirin EC delayed release tablet 81 mg  81 mg Oral Daily Alesia Banda, MD   81 mg at 05/28/20 1610   ? enoxaparin (LOVENOX) syringe 40 mg  40 mg Subcutaneous Q12H Dwyane Luo, MD       ? escitalopram oxalate (LEXAPRO) tablet 10 mg  10 mg Oral Nightly Alesia Banda, MD   10 mg at 05/27/20 2101   ? famotidine (PEPCID) tablet 20 mg  20 mg Oral Daily Alesia Banda, MD       ? [facility] by provider] furosemide (LASIX) tablet 40 mg  40 mg Oral Daily Alesia Banda, MD       ? gabapentin (NEURONTIN) tablet 600 mg  600 mg Oral Nightly Alesia Banda, MD   600 mg at 05/27/20 2101   ? [Held by provider] metoprolol succinate (TOPROL-XL) 24 hr tablet 12.5 mg  12.5 mg Oral Daily Alesia Banda, MD       ? multivitamin with folic acid (THERAGRAN) tablet 1 tablet  1 tablet Oral Daily Alesia Banda, MD   1 tablet at 05/28/20 9604   ? nicotine (NICODERM CQ) transdermal patch 24 hr 7 mg  7 mg Transdermal Daily Alesia Banda, MD   7 mg at 05/28/20 5409   ? oxybutynin XL (DITROPAN-XL) 24 hr tablet 5 mg  5 mg Oral Daily Alesia Banda, MD   5 mg at 05/28/20 8119   ? predniSONE (DELTASONE) tablet 90 mg  90 mg Oral Daily Alesia Banda, MD   90 mg at 05/28/20 1478   ? rosuvastatin (CRESTOR) tablet 40 mg  40 mg Oral Daily Alesia Banda, MD   40 mg at 05/28/20 2956   ? sodium chloride 0.9 % flush 3 mL  3 mL IV Push Q8H Alesia Banda, MD       ? Melene Muller ON 05/30/2020] sulfamethoxazole-trimethoprim (BACTRIM DS;CO-TRIMOXAZOLE DS) 800-160 mg per tablet 1 tablet  1 tablet Oral Once per day on Mon Wed Fri  Alesia Banda, MD       ? tamsulosin [facility]) 24 hr capsule 0.4 mg  0.4 mg Oral Nightly Alesia Banda, MD       ? tiotropium-olodateroL (STIOLTO RESPIMAT) 2.5-2.5 mcg/actuation mist for inhalation 2 Inhalation  2 Inhalation Inhalation Daily Alesia Banda, MD         Continuous Infusions:  PRN Meds:.acetaminophen, albuterol sulfate, carboxymethylcellulose, heparin PF in 0.9 % sodium chloride, LORazepam, melatonin, oxyCODONE, senna, simethicone, sodium chloride, sodium chloride, sodium chloride     ROS    10 point ROS negative except for what is stated in HPI            Allergies   No Known Allergies       Objective:       Gross Totals (Last 24 hours) at 05/28/2020 1159  Last data filed at 05/28/2020 0800  Intake ?   Output 1350 ml   Net -1350 ml       Physical Exam:   Temp:  [97.7 ?F (36.5 ?C)-100.1 ?F (37.8 ?C)] 97.8 ?F (36.6 ?C)  Pulse:  [80-91] 91  Resp:  [16-20] 16  BP: (90-125)/(61-71) 96/64  SpO2:  [94 %-97 %] 96 %  Device (Oxygen Therapy): room air    Gen: Sitting up in NAD  HEENT: , MMM  Neck:  No JVD  CV: Normal S1/S2, no m/r/g  Pulm: CTA b/l symmetric breath sounds; no wheezing/rales/rhonci  Abd: Soft, NT/ND,  Ext: No c/c/e, [facility]     CBC  Recent Labs   Lab 05/25/20  0356 05/27/20  1143 05/28/20  0513   WBC 15.9* 17.1* 14.3*   HGB 10.7* 10.6* 10.3*   HCT 32.6* 31.5* 31.2*   PLT 266 278 228       LFTs  Recent Labs   Lab 05/25/20  0356 05/27/20  1143 05/28/20  0513   ALT 33 59 45   AST 25 21 13    ALKPHOS 69 76 70   BILITOT 0.2 0.2 0.4   BILIDIR  --   --  <0.2    BMP  Recent Labs   Lab 05/24/20  0529 05/25/20  0356 05/27/20  1143 05/28/20  0513   NA 136 139 141 142   K 4.7 4.6 3.6 3.8   CL 104 105 103 104   CO2 22 23 25 28    BUN 28* 40* 32* 27*   CREATININE 1.54* 1.59* 1.55* 1.61*   CALCIUM 8.6* 8.6* 8.4* 8.4*   MG 2.2 2.2 2.1 2.0   PHOS 3.5 3.6  --  3.1       Coags  Recent Labs   Lab 05/24/20  0529   INR 0.94   PTT 22.5*      Cardiac Enzymes  Recent Labs     05/27/20  1143 05/27/20  2027 05/28/20  0513   TROPONINT 0.05* 0.06* 0.05*    pBNP  Lab Results   Component Value Date    BNPPRO 1,015.0 (H) 05/27/2020    BNPPRO 642.0 (H) 05/16/2020    BNPPRO 1,153.0 (H) 05/10/2020     No results found for: BNP   Lipids  Lab Results   Component Value Date    CHOL 141 05/11/2020    HDL 39 (L) 05/11/2020    LDL 75 05/11/2020    TRIG 981 05/11/2020 A1C  Lab Results   Component Value Date    HGBA1C 5.6 04/08/2020        Diagnostics:  Cardiac Diagnostic  Studies:  Transthoracic Echocardiogram:  Results for orders placed or performed during the hospital encounter of 05/09/20   Echo 2D Complete w Doppler and CFI if Ind Image Enhancement 3D and or bubbles   Result Value Ref Range    Reported Biplane EF% 68 %    Narrative     * Normal left ventricular size, systolic function and wall motion. Moderate concentric left ventricular hypertrophy.  LVEF calculated by biplane Simpson's was 68%.  Abnormal tissue Doppler suggestive of abnormal diastolic function.  The inferior and inferoseptal wall are hypokinetic  * Normal right ventricular cavity size, systolic function and wall motion.  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  Previous study ascending aorta size was 4.4cm  * Inferior vena cava was not well visualized.  * Compared with the prior study, dated 02/08/2020, there are changes noted.  Wall motion in inferior and inferoseptal walls noted on current study, though compared to prior image, no significant change in wall motion abnormality.       ECG:  Not on telemetry - please place him on telemetry  EKG from yesterday shows NSR.     IMPRESSION:  69 y.o.?M?with??current tobacco use (1ppd now,?1-2ppd for 50 years, tried to quit?multiple times),?HTN,?OSA on CPAP,?history of CAD s/p MIx2?1996 (1 stent at [facility] area), 2014 ?(3 stents in [facility] hospital at [facility]) and PCI?x4?(on review of [place] chest, likely RCA and LAD stents), HFrEF??t with recovery?of LVEF? (LVEF 68%), stage IV NSCLC; underwent recent PET stress 05/06/20 after presenting with ADHF?that was high risk, w/+TID, 2 large reversible perfusion defects, LVEF of 40% at rest, drop to 36% on stress?with [place] chest showing possible progression of disease. Plavix started cautiously at that time to see if he would tolerate  DAPT with his hematuria. He presents to the hospital due to orthostatic hypotension, now s/p IV fluids and he has elevating troponins, which could be due to multiple reasons. Most seriously, it could be due to progression of the checkpoint inhibitor myocarditis but other DDx include demand ischemia or progression of his underlying CAD and CKD.     I spoke with Ferne Reus, one of the cardio-oncology fellow (who is actually on vacation) and we decided to start him on high dose steroids again. He will need another echo and we will hold off ischemic evaluation (catheterization) for now due to the fact that he is asymptomatic and his EKG is unchanged.       RECOMMENDATIONS:  - Tele  - Limited Echo today to evaluate the function  - Daily troponins  - Daily EKG  - Restart clopidogrel when possible   - IV methylprednisone 1g per day for 3 days total  - Then go back to the taper he was on, start with 130mg  (10mg  taper weekly)  - Hold of ischemic evaluation for now as he is so asymptomatic    Appreciate the excellent care provided by the primary team.  Will continue to follow.     Case discussed with Dr. Jennette Kettle. Recommendations are subject to change as per final attending addendum.   Thank you for allowing [place] to participate in the care of this patient.     Lonni Fix, MD  Cardiology fellow  Cardiology Attending Addendum   Recent Labs   Lab 05/21/20  5638 05/22/20  7564 05/24/20  3329 05/25/20  5188 05/25/20  0356 05/27/20  1143 05/27/20  2027 05/28/20  0513   NA  --   --  136 139  --  141  --  142   K  --   --  4.7 4.6  --  3.6  --  3.8   CO2  --   --  22 23  --  25  --  28   CL  --   --  104 105  --  103  --  104   BUN  --   --  28* 40*  --  32*  --  27*   CREATININE  --   --  1.54* 1.59*  --  1.55*  --  1.61*   GLU  --    < > 142* 103*  --  93  --  101*   WBC  --   --  12.5* 15.9*  --  17.1*  --  14.3*   HCT  --   --  30.4* 32.6*  --  31.5*  --  31.2*   INR 0.90  --  0.94  --   --   -- --   --    TROPONINT  --   --   --   --    < > 0.05* 0.06* 0.05*   MG  --    < > 2.2 2.2  --  2.1  --  2.0   PHOS  --    < > 3.5 3.6  --   --   --  3.1    < > = values in this interval not displayed.     Temp:  [97.7 ?F (36.5 ?C)-100.1 ?F (37.8 ?C)] 98.2 ?F (36.8 ?C)  Pulse:  [80-91] 89  Resp:  [16-18] 18  BP: (96-125)/(64-71) 118/70  SpO2:  [94 %-97 %] 95 %  Device (Oxygen Therapy): room air /   Wt Readings from Last 3 Encounters:   05/27/20 (!) 138.3 kg   05/21/20 (!) 139 kg   05/20/20 (!) 140.9 kg     I interviewed, examined & discussed Jabar Krysiak Stare and led in the development of a plan.   Interim EMR data reviewed.  Situation discussed with patient.    7m admitted yesterday with presyncope in setting episodes diarrhea and abdominal pain.  We are consulted due to rising troponin.Medical issues include NSCLC iv on carbo/taxol/pembro; recently dx'ed bladder cancer after episodes hematuria related to clopidogrel initiation--9/24 cystoscopy/fulguration; known CAD/PCIs with recent PET documented ischemia; obese; OSA/CPAP.  Feels relatively well and is lying flat comfortably.  ECG with mild nonspecific ST-T changes and one VPC.  Cr a bit elevated and troponins flat but elevated as well.  An echocardiogram earlier this month revealed normal systolic function.  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  Worsening ischemia is certainly a possibility as well.  The patient is hesitant to resume clopidogrel, ?I took that pill an 8 hours later it was a disaster.?  He feels it cause the hematuria and everything that followed.  We discussed the fact that his bladder cancer is now treated.  For the moment it is okay to avoid this medication.  A limited echo for LV function will be helpful.  I have perused, discussed & edited  Dr Beckey Rutter note  which reflects our mutual observations, assessment and recommendations for the patient's care.  Any additional comments have been written directly into the Progress Note &/or into this addendum.  If you have any questions, please do not hesitate to contact me.   Patient evaluated at ~ 6:00 p.m. today  Thanks,    ~~~~~~~~~~~~~~~~~~~~~~~~~~~~~~~~~~~~~~~~~~~~~~  Konrad Dolores, MD, [facility], FSCAI  <>  05/28/2020,  7:06 PM  [phone]  <> [phone] <> Cell: [phone] <> MHB: [phone]  Administrative Assistants:  [facility] & [place]:  Sable Feil:  [phone]  [facility]: [place]:  [phone]

## 2020-05-28 NOTE — Other
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 other (comment box: specify)/Callback Cell Phone: [phone] / Please confirm receipt of this message by texting back ?OK?  -  1 - Mobile Heartbeat message sent to Press, B at 1:21 PM. Received response at 1322.  Alycia Rossetti  05/28/2020  1:21 PM  Consult [facility] [phone]

## 2020-05-28 NOTE — H&P
[facility]        [facility]                 Internal Medicine History & Physical    Attending Provider: Dwyane Dee, MD    History provided by: the patient  History limited by: no limitations    CC: pre-syncope    History of Present Illness:    Paul Hunter?is a 69 y.o.?male?with a history of DM, HLD, HTN, emphysema, NSCLC (75 pk-yr smoking history, on carbo/taxol/pembro C5D1 03/24/20), CAD s/p PCI with 3 stents placed in 2014 (on ASA; plavix on hold d/t hematuria with clots), concern for worsening cardiomyopathy on recent PET stress test (ischemic vs ICI-induced, on prednisone), OSA on CPAP, recently diagnosed bladder cancer who presented today after an episode of near-syncope upon exiting his car for a pulm appointment today.   ?  When he stood out of his car he felt very dizzy, presyncopal, nearly LOC. He lowered himself to the ground without injury, denies hitting his head (confirmed by multiple observers). He denied associate CP, SOB, or palpitations. Does note that he has had some abdominal cramping, small volume loose BMs over the past few days (no blood or melena noted). Unsure if he was recently taking abx. In the pulm clinic had 2-3 more large watery BMs. Denies fever/chills. He was referred to the oncology extended care clinic.  ?  In the [facility] clinic: BP 103/64 (Site: l a, Position: Sitting, Cuff Size: Large) ?- Pulse (!) 94 ?- Resp (!) 23 ?- SpO2 98%   EKG done while in ECC, unremarkable. Trop 0.05 (b/l 0.01-0.02) BNP elevated (1015, up from 642 on 9/20, similar to recent HF exac). XR AP The bowel gas pattern is nonobstructive. Right upper quadrant surgical clips are noted.  CXR Multiple bilateral variable sized pulmonary nodules. No acute radiographic findings. Given 1 L NS, tums, and lasix 60 IV x 1  ?  Notably, patient also with multiple recent admissions:  ?  Hematuria bladder cancer hx: Patient has had intermittent hematuria for the past 3 years followed by Dr. Meryl Dare, presented to the [facility] on 05/14/20 with symptomatic urinary retention--> irrigated, Foley replaced. represented to ED with Foley draining difficulty 9/20, d/ced same day. underwent office cystoscopy, large clot seen, 16 Fr Foley placed. presented 9/23 with penile pain/urinary retention, admitted to urology, underwent irrigation, 22 Fr Foley placed, ASA/plavix held, but cystoscopy/clot evacuation deferred when urine cleared, discharged home on 9/24. Planned for outpatient cystoscopy, Cardiology recommended holding ASA/plavixx until OP f/u 10/8.?Pt admitted again 9/25 - 9/29 w/ urinary retention and hematuria clots. Pt had a cysto w/ TURBT, L retrograde pyelogram. He was found to have a bladder tumor. Path c/w bladder cancer. Foley eventually removed and hematuria eventually resolved. Pt seen by pulm for repeat lung bx to see if his lung cancer was not actually a bladder met?(path pending).  ?  HF hx: Also recent hospitalization 9/13-9/16 for HF exacerbation. TTE c/w diastolic dysfunction exacerbation. PET stress with c/f multivessel CAD vs immune checkpoint inhibitor myocarditis. Diuresed, started on metop and discharged on lasix 40mg  PO QD and metop succinate 12.5mg  daily. He was started on plavix at this time with plan for cardiac MRI (scheduled for late October) and cardio-oncology follow-up on 9/24, which he missed due to hospitalization.  ?  Lung cx and cardiac hx: w/ DOE 06/2019 w/ care initially in [place] including PET 11/04/19 w/ hyperavid lung nodules w/ diffuse adenopathy in neck/chest and  upper abdomen, s/p infraclavicular LN bx 11/18/19 w/ focal atypical cells c/f carcinoma, s/p EBUS w/ bx of station 7 LN c/w metastatic SCC w/ insufficient material for molecular testing or PD-L1 staining, started Carbo/Taxol/Pembro 11/28/19 in [place] and then transitioned care to [facility], most recently s/p C4 03/10/20 with plan to transition to pembro maintenance given disease response but therapy deferred given ongoing cardiac work-up) 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    Medical History:     Past Medical History:   Diagnosis Date   ? Anxiety    ? CAD (coronary artery disease)    ? Diabetes mellitus (HC Code) (HC CODE)    ? Difficult intubation 05/22/2020    Patient with significant soft tissue in oropharynx. Glidescope intubation revealed anterior cords with floppy soft tissue obstruction. Patient will require video laryngoscopy for airway management going forward.   ? GERD (gastroesophageal reflux disease)    ? Hyperlipidemia    ? Hypertension    ? Hypertrophy of prostate without urinary obstruction and other lower urinary tract symptoms (LUTS)    ? Other testicular hypofunction    ? Spinal stenosis 09/28/2018   ? Tobacco abuse    ? Vitamin D deficiency      No past medical history pertinent negatives. No family history on file.   Past Surgical History:   Procedure Laterality Date   ? CAROTID STENT  08/28/2003   ? CHOLECYSTECTOMY  11/07/2016   ? KNEE SURGERY      torn meniscus   ? LAMINECTOMY  10/07/2008   ? LUMBAR DISC SURGERY      herniated disc     No past surgical history pertinent negatives on file. Social History     Socioeconomic History   ? Marital status: Divorced     Spouse name: Not on file   ? Number of children: Not on file   ? Years of education: Not on file   ? Highest education level: Not on file   Occupational History   ? Not on file   Tobacco Use   ? Smoking status: Current Every Day Smoker     Types: Cigarettes   Substance and Sexual Activity   ? Alcohol use: Not on file   ? Drug use: Not on file   ? Sexual activity: Not on file   Other Topics Concern   ? Not on file   Social History Narrative   ? Not on file     Social Determinants of Health     Financial Resource Strain:    ? Difficulty of Paying Living Expenses:    Food Insecurity:    ? Worried About Programme researcher, broadcasting/film/video in the Last Year:    ? Barista in the Last Year:    Transportation Needs:    ? Freight forwarder (Medical):    ? Lack of Transportation (Non-Medical):    Physical Activity:    ? Days of Exercise per Week:    ? Minutes of Exercise per Session:    Stress:    ? Feeling of Stress :    Social Connections:    ? Frequency of Communication with Friends and Family:    ?  Frequency of Social Gatherings with Friends and Family:    ? Attends Religious Services:    ? Active Member of Clubs or Organizations:    ? Attends Banker Meetings:    ? Marital Status:    Intimate Partner Violence:    ? Fear of Current or Ex-Partner:    ? Emotionally Abused:    ? Physically Abused:    ? Sexually Abused:         Home Medications:     Medications Prior to Admission   Medication Sig Dispense Refill Last Dose   ? tiotropium (SPIRIVA WITH HANDIHALER) 18 mcg capsule for inhalation Inhale 18 mcg into the lungs daily.      ? albuterol sulfate 90 mcg/actuation HFA aerosol inhaler Inhale 2 puffs into the lungs every 6 (six) hours as needed for wheezing or shortness of breath. 6.7 g 0    ? aspirin 81 MG EC tablet Take 81 mg by mouth daily.      ? atorvastatin (LIPITOR) 80 mg tablet Take 1 tablet (80 mg total) by mouth nightly. 30 tablet 6    ? belladonna alkaloids-opium (B&O SUPPRETTES) 16.2-30 mg suppository Place 1 suppository rectally 2 (two) times daily as needed for bladder spasm. 12 suppository 0    ? carboxymethylcellulose (REFRESH PLUS) 0.5 % ophthalmic solution in dropperette Place 1 drop into both eyes 3 (three) times daily as needed.      ? escitalopram oxalate (LEXAPRO) 10 mg tablet Take 1 tablet (10 mg total) by mouth nightly. 90 tablet 3    ? famotidine (PEPCID) 20 mg tablet Take 1 tablet (20 mg total) by mouth daily. 30 tablet 0    ? fexofenadine (ALLEGRA) 180 mg tablet Take 180 mg by mouth daily as needed.       ? furosemide (LASIX) 40 mg tablet Take 1 tablet (40 mg total) by mouth daily. 30 tablet 2    ? gabapentin (NEURONTIN) 300 mg capsule Take 2 capsules (600 mg total) by mouth nightly. 60 capsule 0    ? LORazepam (ATIVAN) 0.5 mg tablet Take 1 tablet (0.5 mg total) by mouth every 6 (six) hours as needed for anxiety. 15 tablet 0    ? metoprolol succinate XL (TOPROL-XL) 25 mg 24 hr tablet Take 0.5 tablets (12.5 mg total) by mouth daily. Take with or immediately following a meal. 30 tablet 0    ? multivitamin capsule Take 1 capsule by mouth daily.       ? nicotine (NICODERM CQ) 7 mg transdermal patch Place 1 patch (7 mg total) onto the skin daily. Alternate arms/sites when applying patch. Discard old patch when applying new one 28 patch 0    ? oxybutynin XL (DITROPAN-XL) 5 mg 24 hr tablet Take 1 tablet (5 mg total) by mouth daily. 90 tablet 0    ? oxyCODONE (ROXICODONE) 5 mg Immediate Release tablet Take 1 tablet (5 mg total) by mouth every 4 (four) hours as needed for up to 5 doses. 5 tablet 0    ? polyethylene glycol (MIRALAX) 17 gram packet Take 1 packet (17 g total) by mouth daily as needed for constipation. Mix in 8 ounces of water, juice, soda, coffee or tea prior to taking. 14 each 0    ? predniSONE (DELTASONE) 10 mg tablet Take 13 tablets (130 mg total) by mouth daily for 7 days, THEN 12 tablets (120 mg total) daily for 7 days, THEN 11 tablets (110 mg total) daily for 7 days, THEN 10  tablets (100 mg total) daily for 7 days, THEN 9 tablets (90 mg total) daily for 7 days, THEN 8 tablets (80 mg total) daily for 7 days, THEN 7 tablets (70 mg total) daily for 7 days, THEN 6 tablets (60 mg total) daily for 7 days, THEN 5 tablets (50 mg total) daily for 7 days, THEN 4 tablets (40 mg total) daily for 7 days. Take with food.. 595 tablet 0    ? [START ON 06/28/2020] predniSONE (DELTASONE) 10 mg tablet Take 3 tablets (30 mg total) by mouth daily for 7 days, THEN 2 tablets (20 mg total) daily for 7 days, THEN 1 tablet (10 mg total) daily for 7 days, THEN 0.5 tablets (5 mg total) daily for 7 days, THEN 0.5 tablets (5 mg total) daily for 7 days. Take with food.. 49 tablet 0    ? senna-docusate (SENNA-PLUS) 8.6-50 mg tablet Take 1 tablet by mouth nightly. (Patient not taking: Reported on 05/27/2020) 30 tablet 0    ? simethicone (MYLICON) 80 mg chewable tablet Take 1 tablet (80 mg total) by mouth every 6 (six) hours as needed for flatulence. (Patient not taking: Reported on 05/27/2020) 30 tablet 0    ? sulfamethoxazole-trimethoprim (BACTRIM DS;CO-TRIMOXAZOLE DS) 800-160 mg per tablet Take 1 tablet by mouth Every Monday, Wednesday, and Friday. 28 tablet 0        Allergies as of 05/27/2020   ? (No Known Allergies)       Review of Systems:     A 12-point ROS was conducted and was negative, except as noted above in HPI.    Objective:     Vitals:  Temp:  [97.9 ?F (36.6 ?C)-100.1 ?F (37.8 ?C)] 100.1 ?F (37.8 ?C)  Pulse:  [76-94] 90  Resp:  [17-23] 17  BP: (90-123)/(61-72) 123/67  SpO2:  [95 %-98 %] 97 %  Device (Oxygen Therapy): room air   Weight: (!) 138.3 kg    Intake/Output Summary (Last 24 hours) at 05/27/2020 2246  Last data filed at 05/27/2020 2030  Gross per 24 hour   Intake ?   Output 550 ml   Net -550 ml        Orthostatics   123/68  68  100/63  87  68/41  102       Physical Exam:  General: obese male, A&Ox3 lying in bed in moderate discomfort  HEENT: NCAT, PERL, EOMI  CV: RRR, no r/m/g  Pulm: mild wheezing   Abd: diffuse lower abdominal tenderness, somewhat distended, +BS  Ext/MSK: +1-2 LE edema, moving extremities spontaneously  Neuro/Psych: A&Ox3, normal mood, affect  Skin: warm, well-perfused     Labs:     Recent Labs   Lab 05/24/20  0529 05/25/20  0356 05/27/20  1143   WBC 12.5* 15.9* 17.1*   HGB 10.0* 10.7* 10.6*   HCT 30.4* 32.6* 31.5*   PLT 240 266 278    Recent Labs   Lab 05/24/20  0529 05/25/20  0356 05/27/20  1143   NEUTROPHILS 85.6* 79.3 72.5 Recent Labs   Lab 05/24/20  0529 05/25/20  0356 05/27/20  1143   NA 136 139 141   K 4.7 4.6 3.6   CL 104 105 103   CO2 22 23 25    BUN 28* 40* 32*   CREATININE 1.54* 1.59* 1.55*   GLU 142* 103* 93   ANIONGAP 10 11 13     Recent Labs   Lab 05/24/20  0529 05/25/20  0356 05/27/20  1143   CALCIUM  8.6* 8.6* 8.4*   MG 2.2 2.2 2.1   PHOS 3.5 3.6  --       Recent Labs   Lab 05/24/20  0529 05/25/20  0356 05/27/20  1143   ALT 23 33 59   AST 24 25 21    ALKPHOS 70 69 76   BILITOT 0.2 0.2 0.2   PROT 5.9* 5.8* 5.6*   ALBUMIN 2.9* 3.0* 3.0*    Recent Labs   Lab 05/21/20  1922 05/24/20  0529   PTT 23.1 22.5*   LABPROT 9.9 10.3   INR 0.90 0.94        Micro:  9/25 COVID: negative  9/25 UCx: NGTD  10/1 stool pathogens PCR, norovirus, C diff, ova and parasite pending    Diagnostics/Imaging:  10/1 XR AP   The bowel gas pattern is nonobstructive. Right upper quadrant surgical clips are noted.    10/1 CXR   Multiple bilateral variable sized pulmonary nodules. No acute radiographic findings.    EBUS 05/24/20  Cytology pending    05/22/20 Bladder Pathology  FINAL DIAGNOSIS   1. BLADDER, LEFT LATERAL WALL AND TRIGONE, TRANSURETHRAL RESECTION:   ? ? ?- INVASIVE PAPILLARY UROTHELIAL CARCINOMA, HIGH GRADE   ? ? ?- TUMOR INVADES INTO LAMINA PROPRIA   ? ? ?- MUSCULARIS PROPRIA IDENTIFIED BUT NOT INVOLVED BY TUMOR   2. LEFT URETER TUMOR, BIOPSY:   ? ? ?- SUSPICIOUS FOR HIGH GRADE UROTHELIAL CARCINOMA (SEE NOTE)  ?  9/21 [place] chest?  1. Interval progression of innumerable lung metastases.  2. Mixed interval change with respect to adenopathy, overall without significant change.    9/20 CTAP   1. Worsening mild left hydroureteronephrosis with possible superimposed infection.  2. Decompressed bladder revealing wall thickening/soft tissue appearance at the location of left ureteral orifice. Recommend ultrasound evaluation with distended urinary bladder.  3. Metastatic lung disease, worsening.    05/12/20 MRI heart  1. Mildly enlarged left ventricular size. Mildly depressed left ventricular systolic function. LVEF: 49%. There is mild to moderate asymmetric myocardial hypertrophy with the basal to mid septum measuring the thickest, up to 16mm at the basal anteroseptum.  There is basal to mid inferoseptal/inferior wall hypokinesis and associated thinning. Two areas of ischemic delayed enhancement:  subendocardial delayed enhancement basal to mid inferior/inferoseptal wall with 50-75% transmurality, consistent with known myocardial infarction in the distribution of the RCA and a second area that was possibly present on the prior study 04/19/20 but more subtle with subendocardial delayed enhancement in the basal to mid anterior/anteroseptal/anterolateral wall with ~25% transmurality (consistent with prior LAD MI). T2 hyperintensity is noted in a similar location as prior study and heterogeneous mid myocardial delayed enhancement in the basal lateral segments, concerning for myocarditis, possibly related to check point inhibitor therapy and known prior infarct in LAD and RCA distribution.   2. Normal right ventricular size. Normal right ventricular systolic function.  RVEF: 54%. No right ventricular delayed myocardial enhancement.  3. No significant valvular abnormalities.  4. Moderate biatrial enlargement  5. Dilatation of the ascending aorta up to 4 cm.  Enlarged pulmonary artery may be seen in pulmonary hypertension.   Compared to prior study dated 04/19/2020, LVEF appears similar, with possible slight improvement from 44% and RVEF has normalized. Due to technical and artifactual differences, T2 appears in similar location, but cannot quantify differences. DGE burden also appear in the same locations as prior study.   Consider follow up cMRI in 6-8 weeks for monitoring.    05/11/20  ECHO  * Normal left ventricular size, systolic function and wall motion. Moderate concentric left ventricular hypertrophy. LVEF calculated by biplane Simpson's was 68%. Abnormal tissue Doppler suggestive of abnormal diastolic function. The inferior and inferoseptal wall are hypokinetic  * Normal right ventricular cavity size, systolic function and wall motion.  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 Previous study ascending aorta size was 4.4cm  * Inferior vena cava was not well visualized.  * Compared with the prior study, dated 02/08/2020, there are changes noted. Wall motion in inferior and inferoseptal walls noted on current study, though compared to prior image, no significant change in wall motion abnormality.    05/06/20 PET [place] Myocardial Perfusion Stress and Rest with Regadenoson  * Highly abnormal PET myocardial perfusion study following pharmacologic vasodilation with regadenoson and at rest with high risk features. Perfusion imaging was abnormal showing a large sized, moderate to severe intensity, reversible perfusion defect in the basal to apical inferior and inferolateral walls consistent with ischemia; and a medium to large sized, moderate intensity, reversible perfusion defect in the mid to apical anterior and anterolateral walls consistent with ischemia. There is visual transient ischemic dilation There was transient post-stress LV dysfunction, resting ejection fraction 40% and stress ejection fraction 36% with abnormal regional wall motion showing global hypokinesis and inferior akinesis. The left ventricle was severely enlarged in size. Stress electrocardiogram was abnormal due to ischemic ECG changes. [place] performed for attenuation correction showed coronary artery calcification in the left main artery (moderate), left anterior descending artery (severe), diagonal artery (severe), left circumflex artery (mild), and right coronary artery (mild). Non-cardiac findings of the [place] are described in a separate report from Radiology. No prior study available for comparison.  * Global myocardial blood flow was reduced at 0.78 ml/g/min during stress and normal at 0.68 ml/g/min during rest. Global myocardial flow reserve was reduced at 1.15.  * These findings highly suspicious for multi-vessel CAD    EKG:   05/27/20  EKG Sinus rhythm:Borderline T wave abnormalities    Assessment:   69 y.o. 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, on prednisone), OSA on CPAP, recently diagnosed bladder cancer who presented today after an episode of near-syncope upon exiting his car for a pulm appointment today likely iso orthostatic hypotension 2/2 GI losses, diuresis    Plan:   # Syncope/Near syncope  Patient w/positive orthostatics  123/68 ??68 ?-->??68/41 ??102. Patient likely volume down iso recent GI losses, diuresis. Pt reports good PO intake. 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 Doubt ACS, as EKG without acute changes. Trop mildly elevated, chronic and likely 2/2 to myocarditis. BNP elevated, but also chronic, CXR not c/w volume overload. Possible plan for Cath if/when pt can tolerate DAPT without bleeding/hematuria.  - receiving 125 mL/hr NS  - hold lasix and toprol while orthostatic  - TELE/ROMI  - consult cards in AM?- may be time to restart plavix as bladder issue was addressed? - may have to discuss w/ urology   - pt also may need further lung bx if path from bronch is negative?  - repeat ECHO to assess EF  -  prednisone taper on 90mg  daily, tapering by 10mg  every Thurs, next 10/7  ?  # diarrhea, abdominal cramping  Patient w/ abdominal cramping and loose BMs, concern for colitis. AXR unremarkable   - f/u C diff, stool pathogens PCR, stool O&P, norovirus  - hold laxatives  - if pt not retaining urine will obtain [place] abd to rule out abd pathology as cause of abdominal pain  ?  # bladder cancer c/b hematuria/urinary retention  Patient w/ intermittent hematuria, urinary retention over past 3 years. Recent cysto w/ TURBT, L retrograde pyelogram on 9/25-9/29 admission. He was found to have a bladder tumor. Path c/w bladder cancer. Foley now removed.   - check bladder scan to rule out urinary retention   - plavix currently on hold, consult cards/urology to discuss if can be restarted  -?oxybutynin 5 mg daily  - bactrim 800-160 mg oral MWF for PCP prophylaxis  - tamsulosin 0.8 mg nightly    # HFpEF, CAD s/p PCI w/ 4 stents placed, worsening cardiomyopathy (ischemic vs ICI-induced)    cardiac MRI 04/19/20 (mix of ischemic and nonischemic Delayed gadolinium enhancement w/ LVEF 44% +WMA; started on pred 1mg /kg/day f/b weekly taper, w/ stress PET 05/06/20 that was high risk w/ two large reversible perfusion defects and LVEF drop from 40-36% on stress and thus cautiously started on plavix (now on hold). with ongoing workup with cardio-oncology.?  - HOLD furosemide 40 mg daily iso orthostatic hypotension  - HOLD metoprolol succinate 12.5 mg daily iso orthostatic hypotension  - cards consult, LHC when can tolerate plavix  - c/w aspirin 81 mg daily    # NSCLC, emphysema  75 pk-yr smoking history. 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  - tiotropium-olodaterol 2.5-2.5 mcg 2 inhalations daily  - albuterol q6h PRN  - may need further lung bx if path from bronch is negative     Chronic:  # tobacco use disorder: nicotine patch 7mg  daily  # pain: gabapentin 600 qhs, acetaminophen 650 q6h PRN, oxycodone 5mg  q6h PRN  # mood: escitalopram 10 mg nightly, lorazepam 0.5 mg q8h PRN  # CKD Etiology unclear, likely HTN induced, Cr 1.4 at baseline      #FEN: cardiac  #PPX: lovenox 40mg  SQ daily, famotidine 20 mg daily  #ACCESS: single lumen port 02/04/20  #CODE: Full Code  #DISPO: pending     Primary Emergency Contact: AUSTIN,[name], Home Phone: [phone]    Notifications:     PCP: Pcp, No   Primary Care Provider was notified of this admission. Yes  Family was notified of this admission. Yes  Plan discussed with patient and/or family. Yes    Signed:  Leary Roca MD  Internal Medicine, PGY-1  [facility]: [phone]  Signed at 10:46 PM.    May 27, 2020  10:46 PM    Attending Addendum  Agree w/ above  ?  Pt seen and examined at 7pm on 10/1  ?  Pt is a 69yo male with PMH of CAD (s/p MI x2, s/p PCI x4), HFrEF, emphysema, 75 pack year smoking history, OSA (on CPAP),?hematuria (intermittent for ~52yr w/ neg work-up, last in 2019)?spinal stenosis, depression, and stage IV NSCLC (initially presented w/ DOE 06/2019 w/ care initially in [place] including PET 11/04/19 w/ hyperavid lung nodules w/ diffuse adenopathy in neck/chest and upper abdomen, s/p infraclavicular LN bx 11/18/19 w/ focal atypical cells c/f carcinoma, s/p EBUS w/ bx of station 7 LN c/w metastatic SCC w/ insufficient material for molecular testing or PD-L1 staining, started Carbo/Taxol/Pembro 11/28/19 in [place] and then transitioned care to [facility], most recently s/p C4 03/10/20 with plan to transition to pembro maintenance given disease response but therapy deferred given ongoing cardiac work-up) 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 (mix of ischemic and nonischemic DGE w/ LVEF 44% +WMA; started on pred 1mg /kg/day f/b weekly taper, w/ stress PET 05/06/20 that was high risk w/ large reversible perfusion defects and LVEF drop from 40-36% on stress and thus cautiously started on plavix) and intermittent gross hematuria (evaluated by Urology as outpatient w/ plan for cystoscopy) with multiple recent admits: admitted?9/13-9/16 after noted wt gain, pitting edema and worsening DOE c/f acute decompensated HF w/ TTE c/w diastolic dysfunction exacerbation; he was diuresed, started on metop and discharged on lasix 40mg  PO QD and metop succinate 12.5mg  daily w/ plan for outpatient Cystoscopy for ongoing hematuria. ECC visit 9/18 w/ recurrent hematuria and urinary retention; he was seen by Urology, manually irrigated w/ clearance of urine, started on pyridium and sent home w/ foley catheter. Again admitted 9/23-9/24 on Urology service after p/w urinary retention, penile pain and poor foley drainage; he underwent bedside irrigation and given urine was clear red without clots and Hgb and Cr at baseline, no operative intervention was recommended but planned to have cystoscopy as outpatient and Cardiology recommended holding plavix until outpatient f/u 06/03/20. Pt admitted again 9/25 - 9/29 w/ urinary retention and hematuria clots. Pt had a cysto w/ TURBT, L retrograde pyelogram. He was found to have a bladder tumor. Path c/w bladder cancer. Foley eventually removed and hematuria eventually resolved. Pt seen by pulm for repeat lung bx to see if his lung cancer was  actually a bladder met (path pending)   ?  Pt returned to a pulm appointment today. He had near syncope upon getting out of his car. He was referred to the [facility]. In the [facility] he was given 1 L NS, tums, and lasix 60 IV x 1  ?  Pt reports that when he stood up out of his car he felt very dizzy/presyncopal. He states that he nearly had LOC. He lowered himself to the ground without injury. Pt denies head strike and apparently mult observers confirm this. No associated CP, SOB, or palps. He recovered. Feels OK now. He noted abd cramping. He states that he had small volume loose BMs the past few days. In the pulm clinic he had 2-3 large BMs that were watery in nature. No blood noted. Pt denies change in PO.   ?  Vitals 98.2    83    20    90/61    95  ?  Exam: notable for mild wheezing on lung exam  Abd w/ mild  diffuse pain  Heart RRR  ?  Labs reviewed  ?  A/P: 69 year old male PMH presents w/ the following  ?  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 - may have to discuss w/ urology - pt also may need further lung bx if path from bronch is negative   - repeat echo to assess EF  - cont steroid taper - on 90mg  q day - down to 80 next thurs per pt   ?  GI  - pt w/ reported abd cramping and loose BMs  - possible cause of his above dehydration/volume depletion although pt states that he was taking good PO and the diarrhea appears to have happened after the episode  - AXR unremarkable   - will check stool studies  - hold laxatives  - check bladder scan to rule out urinary retention   - if pt not retaining will obtain [facility] abd to rule out abd pathology   ?  Rest of plan per intern note

## 2020-05-28 NOTE — Utilization Review (ED)
UM Status: Managed Medicare IP-  Hx of stage IV NSCLC, Near syncope, elevated troponin, elevated BNP.  IV lasix, possible cath.

## 2020-05-28 NOTE — Plan of Care
Plan of Care Overview/ Patient Status    Pt arrived to unit, no complaints of pain AAOx4. Admission completed, provider at bedside. Pt educated on fall risk and cooperative. Call bell within reach, see flow sheet and MAR for further details0200- floor unable to provide telemetry monitor. Provider made aware. Will continue to monitor. 0600- bladder can showed 464, pt able to void 200 ml, additional pvr scan showed 189. Problem: Adult Inpatient Plan of CareGoal: Plan of Care ReviewOutcome: Interventions implemented as appropriateGoal: Patient-Specific Goal (Individualized)Outcome: Interventions implemented as appropriateGoal: Absence of Hospital-Acquired Illness or InjuryOutcome: Interventions implemented as appropriateGoal: Optimal Comfort and WellbeingOutcome: Interventions implemented as appropriateGoal: Readiness for Transition of CareOutcome: Interventions implemented as appropriate Problem: Fall Injury RiskGoal: Absence of Fall and Fall-Related InjuryOutcome: Interventions implemented as appropriate

## 2020-05-28 NOTE — Progress Notes
[facility] Progress NoteInterval events:Today: - Orthostatic symptoms resolved- Had one more well-formed stool this AM - No subjective dyspnea, chest pains or palpitations Medications: Current Facility-Administered Medications Medication Dose Route Frequency Provider Last Rate Last Admin ? aspirin EC delayed release tablet 81 mg  81 mg Oral Daily Alesia Banda, MD   81 mg at 05/28/20 1610 ? enoxaparin (LOVENOX) syringe 40 mg  40 mg Subcutaneous Q12H Dwyane Luo, MD     ? escitalopram oxalate (LEXAPRO) tablet 10 mg  10 mg Oral Nightly Alesia Banda, MD   10 mg at 05/27/20 2101 ? famotidine (PEPCID) tablet 20 mg  20 mg Oral Daily Alesia Banda, MD     ? [facility] by provider] furosemide (LASIX) tablet 40 mg  40 mg Oral Daily Alesia Banda, MD     ? gabapentin (NEURONTIN) tablet 600 mg  600 mg Oral Nightly Alesia Banda, MD   600 mg at 05/27/20 2101 ? methylPREDNISolone (Solu-MEDROL) 1 g in sodium chloride 0.9% 100 mL (mini-bag plus)  1 g Intravenous Daily Dwyane Luo, MD 200 mL/hr at 05/28/20 1415 1 g at 05/28/20 1415 ? [Held by provider] metoprolol succinate (TOPROL-XL) 24 hr tablet 12.5 mg  12.5 mg Oral Daily Alesia Banda, MD     ? multivitamin with folic acid (THERAGRAN) tablet 1 tablet  1 tablet Oral Daily Alesia Banda, MD   1 tablet at 05/28/20 9604 ? nicotine (NICODERM CQ) transdermal patch 24 hr 7 mg  7 mg Transdermal Daily Alesia Banda, MD   7 mg at 05/28/20 5409 ? oxybutynin XL (DITROPAN-XL) 24 hr tablet 5 mg  5 mg Oral Daily Alesia Banda, MD   5 mg at 05/28/20 8119 ? rosuvastatin (CRESTOR) tablet 40 mg  40 mg Oral Daily Alesia Banda, MD   40 mg at 05/28/20 1478 ? sodium chloride 0.9 % flush 3 mL  3 mL IV Push Q8H Alesia Banda, MD     ? Melene Muller ON 05/30/2020] sulfamethoxazole-trimethoprim (BACTRIM DS;CO-TRIMOXAZOLE DS) 800-160 mg per tablet 1 tablet  1 tablet Oral Once per day on Mon Wed Fri Alesia Banda, MD     ? tamsulosin [facility]) 24 hr capsule 0.4 mg  0.4 mg Oral Nightly Alesia Banda, MD     ? tiotropium-olodateroL (STIOLTO RESPIMAT) 2.5-2.5 mcg/actuation mist for inhalation 2 Inhalation  2 Inhalation Inhalation Daily Alesia Banda, MD     Exam:VS reviewedBP 118/70  - Pulse 89  - Temp 98.2 ?F (36.8 ?C) (Oral)  - Resp 18  - Ht 6' (1.829 m)  - Wt (!) 138.3 kg  - SpO2 95%  - BMI 41.37 kg/m? Physical Exam:General: well appearing, in no distress HENT: NCAT, mucus membranes moist, no oral/pharyngeal lesionsEyes: PERRL, EOMI, sclerae anictericNeck: unable to appreciate JVD d/t neck circumference CV: regular rate and rhythm, normal S1/S2, no murmurs, rubs, gallopsPulm: CTAB, breathing non-labored, faint crackles at L base that clear Abd: soft, non-tender, non-distendedMSK: motor grossly intact, 1+ pitting edema BLE (L>R)Neuro: CN II-XII grossly intact, sensation grossly intact, AOx3Skin: warm and dry, no rashes, lesions, purpura Labs:All recent labs reviewed, notable GNF:AOZHYQ Labs Lab 09/29/210356 10/01/211143 10/02/210513 WBC 15.9* 17.1* 14.3* HGB 10.7* 10.6* 10.3* HCT 32.6* 31.5* 31.2* PLT 266 278 228  Recent Labs Lab 09/29/210356 10/01/211143 10/02/210513 NEUTROPHILS 79.3 72.5 80.0  Recent Labs Lab 09/29/210356 10/01/211143 10/02/210513 NA 139 141 142 K 4.6 3.6 3.8 CL 105 103 104 CO2 23  25 28 BUN 40* 32* 27* CREATININE 1.59* 1.55* 1.61* GLU 103* 93 101* ANIONGAP 11 13 10   Recent Labs Lab 09/29/210356 10/01/211143 10/02/210513 CALCIUM 8.6* 8.4* 8.4* MG 2.2 2.1 2.0 PHOS 3.6  --  3.1  Recent Labs Lab 09/29/210356 10/01/211143 10/02/210513 ALT 33 59 45 AST 25 21 13  ALKPHOS 69 76 70 BILITOT 0.2 0.2 0.4 BILIDIR  --   --  <0.2  Recent Labs Lab 09/25/211922 09/28/210529 PTT 23.1 22.5* LABPROT 9.9 10.3 INR 0.90 0.94  Imaging:All recent imaging reviewed.Assessment/Plan:Paul Hunter is a 69yo gentleman with PMH significant for HTN, T2DM, CAD s/p PCI x3 (2014) and recent history of malignant NSCLC (dx 06/2019), worsening CM (CAD vs ICI toxicity) and new bladder malignancy (dx 04/2020) who presented after near syncopal event found likely hypovolemic in nature found to have NSTEMI, admitted for IV steroids iso 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 #Pre-syncopeEvent likely orthostatic in nature given rise from sitting to standing, positive orthostatic vitals and history suggestive for hypovolemia (diarrhea, recent addition of diuretics). 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 Underwent PET stress on 9/10 showing large reversible perfusion deficits and LVEF drop to 36-40% so started on DAPT given concern for significant CAD component as well. Unfortunately unable to tolerate DAPT d/t hematuria- Cards consulted, appreciate recs- Holding metoprolol and furosemide iso hypotension - Plan for 3 days of IV methylprednisolone 1g --> PO prednisone weekly taper starting at 130mg  (+Bactrim MWF for ppx)- Continue telemetry- Daily troponin and EKG - Continue ASA, statin - Ok to resume Plavix if cleared by Urology (per Urology, ok if no hematuria) - Obtain limited TTE for evaluation of systolic function #DiarrheaEtiology viral vs infectious vs less likely ICI toxicity. Improved slightly on admission- F/up stool studies #NSCLC#COPDSignificant smoking history (75pack year), still current smoker. Dx 06/2019 in [facility], started on carbo/taxol/Pembro (11/2019) before transition of care to [facility]. 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 Recent plan to transition to [facility] maintenance stopped d/t concern for ICI toxicity. Underwent repeat EBUS w/ bx on 9/28 for re-evaluation (two separate primaries vs malignant bladder cancer) - Continue Stiolto daily - Nicotine patch for cravings - F/up path results #Bladder cancer Intermittent hematuria for several years, recently worsened on DAPT c/b clotting and urinary retention. 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 - Continue bladder scans to r/o retention- Continue oxybutynin 5mg  for now - If restarting Plavix, monitor closely for hematuria/retention #Pain management: continue gabapentin 600mg  nightly, Tylenol and oxycodone PRN #Mood: continue escitalopram 10mg  nightly CODE STATUS: FULL CODEDiet: CardiacDVT ppx: LovenoxDispo: floor Paul Hunter, M.D.[facility], PGY-2Reachable through [facility].

## 2020-05-28 NOTE — Plan of Care
Plan of Care Overview/ Patient Status    Problem: Adult Inpatient Plan of CareGoal: Plan of Care ReviewOutcome: Interventions implemented as appropriate Pt A&Ox4, tele initiated, soft bp this AM, bolus per orders, improved BP in afternoon, on RA, Cardiac diet, good po intake. LBM 10/2, stool samples sent per orders, pt on r/o norovirus, contact plus precautions maintained. Cont of b&B, needs encouragement to urinate. Urinal at bedside. SL port to chest, +flush and blood return. No skin issues noted, pt reports abd cramping, relief with simithicone. No ortho BP today, SBA with no device in room, takes meds whole with water. Call light in reach, fall and safety maintained, T&R indep. Hourly rounding performed.

## 2020-05-28 NOTE — Other
-    CONSULT  REQUEST  DOCUMENTATION  -  [facility] NOTE  -  Type of consult: [facility] Cardiology General    -  New Consult: [id number] Onalee Hua J Hoe /Location: 7620/7620-A / Brief Clinical Question: 69yo M with PMH metastatic NSCLC vs bladder cancer, HFpEF w/ recent PET stress showing rev large perfusion deficits (CAD +/- ICI) p/w near syncope, likely orthostatic. Alsao c/f NSTEMI?further cardiac w/up vs management inpatient ?change in steroid taper/Callback Cell Phone: [phone] / Please confirm receipt of this message by texting back ?OK?  -  1 - Mobile Heartbeat message sent to Melvinsdottir Noel Gerold at 9:39 AM. Received response at 0940.  Alycia Rossetti  05/28/2020  9:39 AM  Consult [facility] [phone]

## 2020-05-29 ENCOUNTER — Inpatient Hospital Stay: Admit: 2020-05-29 | Payer: PRIVATE HEALTH INSURANCE

## 2020-05-29 DIAGNOSIS — R55 Syncope and collapse: Secondary | ICD-10-CM

## 2020-05-29 DIAGNOSIS — R079 Chest pain, unspecified: Secondary | ICD-10-CM

## 2020-05-29 LAB — COMPREHENSIVE METABOLIC PANEL
BKR A/G RATIO: 1.1 (ref 1.0–2.2)
BKR ALANINE AMINOTRANSFERASE (ALT): 45 U/L (ref 9–59)
BKR ALBUMIN: 3.3 g/dL — ABNORMAL LOW (ref 3.6–4.9)
BKR ALKALINE PHOSPHATASE: 89 U/L (ref 9–122)
BKR ANION GAP: 12 (ref 7–17)
BKR ASPARTATE AMINOTRANSFERASE (AST): 15 U/L — ABNORMAL HIGH (ref 10–35)
BKR AST/ALT RATIO: 0.3 mg/dL — ABNORMAL HIGH (ref 70–100)
BKR BILIRUBIN TOTAL: 0.2 mg/dL (ref <=1.2)
BKR BUN / CREAT RATIO: 22.7 % — ABNORMAL LOW (ref 8.0–23.0)
BKR CALCIUM: 8.8 mg/dL — ABNORMAL HIGH (ref 8.8–10.2)
BKR CHLORIDE: 107 mmol/L — ABNORMAL HIGH (ref 98–107)
BKR CO2: 25 mmol/L (ref 20–30)
BKR CREATININE: 1.41 mg/dL — ABNORMAL HIGH (ref 0.40–1.30)
BKR EGFR (AFR AMER): >60 mL/min/1.73m2 (ref >60)
BKR EGFR (NON AFRICAN AMERICAN): 50 mL/min/{1.73_m2} (ref >60)
BKR GLOBULIN: 2.9 g/dL (ref 2.3–3.5)
BKR GLUCOSE: 226 mg/dL — ABNORMAL HIGH (ref 70–100)
BKR PROTEIN TOTAL: 6.2 g/dL — ABNORMAL LOW (ref 6.6–8.7)
BKR SODIUM: 144 mmol/L (ref 136–144)

## 2020-05-29 LAB — CBC WITH AUTO DIFFERENTIAL
BKR BLOOD UREA NITROGEN: 9.7 fL — ABNORMAL HIGH (ref 6.0–11.0)
BKR POTASSIUM: 30.8 % — ABNORMAL LOW (ref 37.0–52.0)
BKR WAM ABSOLUTE IMMATURE GRANULOCYTES: 0.3 x 1000/ÂµL (ref 0.0–0.3)
BKR WAM ABSOLUTE LYMPHOCYTE COUNT: 1.1 x 1000/ÂµL (ref 1.0–4.0)
BKR WAM ABSOLUTE NRBC: 0 x 1000/??L (ref 0.0–0.0)
BKR WAM ANALYZER ANC: 13.2 x 1000/ÂµL — ABNORMAL HIGH (ref 1.0–11.0)
BKR WAM BASOPHIL ABSOLUTE COUNT: 0 x 1000/ÂµL (ref 0.0–0.0)
BKR WAM BASOPHILS: 0.1 % (ref 0.0–4.0)
BKR WAM EOSINOPHIL ABSOLUTE COUNT: 0 x 1000/??L (ref 0.0–1.0)
BKR WAM EOSINOPHILS: 0 % (ref 0.0–7.0)
BKR WAM HEMATOCRIT: 30.8 % — ABNORMAL LOW (ref 37.0–52.0)
BKR WAM HEMOGLOBIN: 10.1 g/dL — ABNORMAL LOW (ref 12.0–18.0)
BKR WAM IMMATURE GRANULOCYTES: 2.1 % (ref 0.0–3.0)
BKR WAM LYMPHOCYTES: 7.1 % — ABNORMAL LOW (ref 8.0–49.0)
BKR WAM MCH (PG): 31 pg (ref 27.0–31.0)
BKR WAM MCHC: 32.8 g/dL (ref 31.0–36.0)
BKR WAM MCV: 94.5 fL — ABNORMAL HIGH (ref 78.0–94.0)
BKR WAM MONOCYTE ABSOLUTE COUNT: 0.3 x 1000/??L (ref 0.0–2.0)
BKR WAM MONOCYTES: 1.8 % — ABNORMAL LOW (ref 4.0–15.0)
BKR WAM MPV: 9.7 fL (ref 6.0–11.0)
BKR WAM NEUTROPHILS: 88.9 % — ABNORMAL HIGH (ref 37.0–84.0)
BKR WAM NUCLEATED RED BLOOD CELLS: 0 % (ref 0.0–1.0)
BKR WAM PLATELETS: 248 x1000/ÂµL (ref 140–440)
BKR WAM RDW-CV: 14.6 % — ABNORMAL HIGH (ref 11.5–14.5)
BKR WAM RED BLOOD CELL COUNT: 3.3 M/??L — ABNORMAL LOW (ref 3.8–5.9)
BKR WAM WHITE BLOOD CELL COUNT: 14.8 x1000/??L — ABNORMAL HIGH (ref 4.0–10.0)

## 2020-05-29 LAB — MAGNESIUM: BKR MAGNESIUM: 2.2 mg/dL (ref 1.7–2.4)

## 2020-05-29 LAB — TROPONIN T     (Q): BKR TROPONIN T: 0.04 ng/mL — ABNORMAL HIGH (ref <0.01)

## 2020-05-29 LAB — NOROVIRUS BY RT-PCR     (BH GH LMW YH): BKR NOROVIRUS BY RT-PCR: NOT DETECTED

## 2020-05-29 MED ORDER — PANTOPRAZOLE IV PUSH 40 MG VIAL & NS (ADULTS)
Freq: Two times a day (BID) | INTRAVENOUS | Status: DC
Start: 2020-05-29 — End: 2020-05-30
  Administered 2020-05-30 (×2): 10.000 mL via INTRAVENOUS

## 2020-05-29 MED ORDER — SULFUR HEXAFLUORIDE MICROSPHERES 25 MG INTRAVENOUS SUSPENSION
25 mg | Freq: Once | INTRAVENOUS | Status: CP | PRN
Start: 2020-05-29 — End: ?
  Administered 2020-05-29: 18:00:00 25 mL via INTRAVENOUS

## 2020-05-29 MED ORDER — METOPROLOL SUCCINATE ER (TOPROL) 12.5 MG HALFTAB
12.5 mg | Freq: Every day | ORAL | Status: DC
Start: 2020-05-29 — End: 2020-05-31
  Administered 2020-05-29 – 2020-05-30 (×2): 12.5 mg via ORAL

## 2020-05-29 MED ORDER — FUROSEMIDE 20 MG TABLET
20 mg | Freq: Every day | ORAL | Status: DC
Start: 2020-05-29 — End: 2020-05-31
  Administered 2020-05-29 – 2020-05-30 (×2): 20 mg via ORAL

## 2020-05-29 MED ORDER — PANTOPRAZOLE IV PUSH 40 MG VIAL & NS (ADULTS)
Freq: Once | INTRAVENOUS | Status: CP
Start: 2020-05-29 — End: ?
  Administered 2020-05-29: 15:00:00 20.000 mL via INTRAVENOUS

## 2020-05-29 MED ORDER — NICOTINE (POLACRILEX) 2 MG BUCCAL MINI LOZENGE
2 mg | ORAL | Status: DC | PRN
Start: 2020-05-29 — End: 2020-05-31
  Administered 2020-05-30 (×2): 2 mg via ORAL

## 2020-05-29 NOTE — Progress Notes
Cardiology Attending  Hospital Day: 2PCP: Pcp, NoAssessment: 47m admitted yesterday with presyncope in setting episodes diarrhea and abdominal pain.  We are consulted due to rising troponin.Medical issues include NSCLC iv on carbo/taxol/pembro; recently dx'ed bladder cancer after episodes hematuria related to clopidogrel initiation--9/24 cystoscopy/fulguration; known CAD/PCIs with recent PET documented ischemia; obese; OSA/CPAP.  Feels relatively well and is lying flat comfortably.  ECG with mild nonspecific ST-T changes and one VPC.  Cr a bit elevated and troponins flat but elevated as well.  An echocardiogram earlier this month revealed normal systolic function.  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  Worsening ischemia is certainly a primary or contributing possibility as well.?	Describes no symptoms suggestive of angina, CHF or arrhythmia/conduction disturbance.?	Echocardiogram to be obtained today?	No exam CHF?	Hypertensive today?	Recurrent hematuria.  Melena, GI consult in progress?	Remains on aspirin.  Remains off clopidogrel.  Enoxaparin prophylaxis discontinued?	Hematocrit, troponin and Cr abnormal but stableSuggest: ?	Await echo data?	Continue current cardiology plan?	Await GI and GU inputInterim History: Patient is concerned and frustrating recurs guarding recurrence of hematuria.Review of Allergies/Meds/Hx: I have reviewed the patient's past medical history, past surgical history, allergies, prior and current medications.Current Facility-Administered Medications Medication Dose Route Frequency Last Rate ? acetaminophen  650 mg Oral Q6H PRN   ? albuterol sulfate  2 puff Inhalation Q6H PRN   ? [Held by provider] aspirin  81 mg Oral Daily   ? carboxymethylcellulose  2 drop Both Eyes TID PRN   ? [Held by provider]  enoxaparin prophylaxis dosing  40 mg Subcutaneous Q12H   ? escitalopram oxalate  10 mg Oral Nightly   ? [Held by provider] famotidine  20 mg Oral Daily   ? furosemide  20 mg Oral Daily   ? gabapentin  600 mg Oral Nightly   ? heparin PF in 0.9 % sodium chloride  5 mL Intra-Catheter PRN for Line Care   ? LORazepam  0.5 mg Oral Q8H PRN   ? melatonin  6 mg Oral Nightly PRN   ? methylPREDNISolone IV Push OR IVPB (SOLU-MEDROL) all ages  1 g Intravenous Daily 1 g (05/29/20 0904) ? metoprolol succinate XL  12.5 mg Oral Daily   ? multivitamin with folic acid  1 tablet Oral Daily   ? nicotine  7 mg Transdermal Daily   ? oxybutynin XL  5 mg Oral Daily   ? oxyCODONE  5 mg Oral Q6H PRN   ? pantoprazole (PROTONIX) IV PUSH 40 mg injection  40 mg IV Push Q12H   ? rosuvastatin  40 mg Oral Daily   ? senna  2 tablet Oral BID PRN   ? simethicone  80 mg Oral Q6H PRN   ? sodium chloride  10 mL Intra-Catheter PRN for Line Care   ? sodium chloride  20 mL Intra-Catheter PRN for Line Care   ? sodium chloride  3 mL IV Push Q8H   ? sodium chloride  3 mL IV Push PRN for Line Care   ? [START ON 05/30/2020] sulfamethoxazole-trimethoprim  1 tablet Oral Once per day on Mon Wed Fri   ? tamsulosin  0.4 mg Oral Nightly   ? tiotropium-olodateroL  2 Inhalation Inhalation Daily   Physical Exam: Vitals:  Temp:  [97.8 ?F (36.6 ?C)-98.4 ?F (36.9 ?C)] 97.8 ?F (36.6 ?C)Pulse:  [54-103] 82Resp:  [17-20] 20BP: (118-178)/(65-90) 168/85SpO2:  [94 %-96 %] 94 %Device (Oxygen Therapy): room air /   / Gross Totals (Last 24 hours) at 05/29/2020 1408Last data filed at  05/29/2020 0300Intake -- Output 350 ml Net -350 ml  / Exam unchanged compared to priorLab Data  All lab data & diagnostics reviewed.  Recent Labs Lab 09/28/210529 09/28/210529 09/29/210356 10/02/210513 10/03/210527 NA 136  --  139 142 144 K 4.7  --  4.6 3.8 3.8 CO2 22  --  23 28 25  CL 104  --  105 104 107 BUN 28*  --  40* 27* 32* CREATININE 1.54*  --  1.59* 1.61* 1.41* GLU 142*  --  103* 101* 226* WBC 12.5*  --  15.9* 14.3* 14.8* HCT 30.4*  --  32.6* 31.2* 30.8* INR 0.94  --   --   --   --  TROPONINT  --    < >  --  0.05* 0.04* MG 2.2  --  2.2 2.0 2.2 PHOS 3.5  --  3.6 3.1  --   < > = values in this interval not displayed. __________________________Thanks:Uva Runkel Jennette Kettle, MD, [facility], FSCAI  <>  05/29/2020,  0:10 [phone]  <> [phone] <> Cell: [phone] Administrative Assistants:[place] [employer] [place]:  Sable Feil:  [phone]: Sherald Barge:  [phone]

## 2020-05-29 NOTE — Plan of Care
Plan of Care Overview/ Patient Status    Pt aaox4 no complaints of pain. Pt reports feeling much better than yesterday. Dinner provided from pt fridge. VSS, call bell within reach. See MAR and Flowsheet for further detailsProblem: Adult Inpatient Plan of CareGoal: Plan of Care ReviewOutcome: Interventions implemented as appropriateGoal: Patient-Specific Goal (Individualized)Outcome: Interventions implemented as appropriateGoal: Absence of Hospital-Acquired Illness or InjuryOutcome: Interventions implemented as appropriateGoal: Optimal Comfort and WellbeingOutcome: Interventions implemented as appropriateGoal: Readiness for Transition of CareOutcome: Interventions implemented as appropriate Problem: Fall Injury RiskGoal: Absence of Fall and Fall-Related InjuryOutcome: Interventions implemented as appropriate Problem: Physical Therapy GoalsGoal: Physical Therapy GoalsDescription: PT GOALS1. Patient will perform bed mobility independently2. Patient will perform transfers with least assistive device with modified independence3. Patient will ambulate a minimum of 150 feet with least assistive device with modified independence4. Patient will ascend and descend at least 1 flight of steps with modified independenceOutcome: Interventions implemented as appropriate

## 2020-05-29 NOTE — Consults
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 GI consulted for new onset melena.    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 HE was started on high dose prednisone taper (1mg /kg) at that time. Subsequent outpatient PET stress test 9/10 re-demonstrated concerns for both of these processes, so plavix was also added to his medication regimen. Shortly thereafter, he had multiple ED and office encounters for hematuria in which his plavix was ultimately stopped around 9/23 per chart review. His Hgb slowly downtrended from 11-12 to mid-10s.     This past Wednesday 9/29, he started having 1-2 black liquid stools daily. On 10/1, he felt lightheaded and pre-syncopal at his pulmonology appointment and was therefore sent to the ED for evaluation. Since admission, vitals stable. Hgb has remained stable around 10, BUN/Crt ratio 22.7. Platelets, LFTs normal. CTAP with IV contrast earlier in admission with normal appearance of bowels and hepatobiliary system.    On evaluation, resting comfortably in bed. No recurrence of pre-syncopal symptoms since receiving IVF hydration. Reports having a black stool overnight, none today. No nausea, vomiting, abdominal pain. Has had hematuria. Only on aspirin and lovenox ppx presently.    He reports taking pepto-bismol frequently for the past few weeks, but no iron tablets. Not on a PPI, only recently prescribed pepcid (not picked up). Infrequent NSAID use. Drinks 1-2 alcohol beverages a month. Daily heavy tobacco use.     Reports having upper/lower endoscopies in 2019 prior to moving to [place] (from ?[place]), where he reports both were normal.       Review of Systems:   A 10 point review of systems was performed and is negative except pertinent findings in HPI.    Review of Medical/Family/Surgical/Social History/Medications:     Past Medical History:   Diagnosis Date   ? Anxiety    ? CAD (coronary artery disease)    ? Diabetes mellitus (HC Code) (HC CODE)    ? Difficult intubation 05/22/2020    Patient with significant soft tissue in oropharynx. Glidescope intubation revealed anterior cords with floppy soft tissue obstruction. Patient will require video laryngoscopy for airway management going forward.   ? GERD (gastroesophageal reflux disease)    ? Hyperlipidemia    ? Hypertension    ? Hypertrophy of prostate without urinary obstruction and other lower urinary tract symptoms (LUTS)    ? Other testicular hypofunction    ? Spinal stenosis 09/28/2018   ? Tobacco abuse    ? Vitamin D deficiency      No past medical history pertinent negatives. No family history on file.   Past Surgical History:   Procedure Laterality Date   ? CAROTID STENT  08/28/2003   ? CHOLECYSTECTOMY  11/07/2016   ? KNEE SURGERY      torn meniscus   ? LAMINECTOMY  10/07/2008   ? LUMBAR DISC SURGERY      herniated disc     No past  surgical history pertinent negatives on file. Social History     Tobacco Use   ? Smoking status: Current Every Day Smoker     Types: Cigarettes   Substance Use Topics   ? Alcohol use: Not on file   ? Drug use: Not on file        Outpatient Medications:  No current facility-administered medications on file prior to encounter.     Current Outpatient Medications on File Prior to Encounter   Medication Sig Dispense Refill   ? aspirin 81 MG EC tablet Take 81 mg by mouth daily.     ? atorvastatin (LIPITOR) 80 mg tablet Take 1 tablet (80 mg total) by mouth nightly. 30 tablet 6   ? carboxymethylcellulose (REFRESH PLUS) 0.5 % ophthalmic solution in dropperette Place 1 drop into both eyes 3 (three) times daily as needed.     ? escitalopram oxalate (LEXAPRO) 10 mg tablet Take 1 tablet (10 mg total) by mouth nightly. 90 tablet 3   ? fexofenadine (ALLEGRA) 180 mg tablet Take 180 mg by mouth daily as needed.      ? furosemide (LASIX) 40 mg tablet Take 1 tablet (40 mg total) by mouth daily. 30 tablet 2   ? gabapentin (NEURONTIN) 300 mg capsule Take 2 capsules (600 mg total) by mouth nightly. 60 capsule 0   ? multivitamin capsule Take 1 capsule by mouth daily.      ? nicotine (NICODERM CQ) 7 mg transdermal patch Place 1 patch (7 mg total) onto the skin daily. Alternate arms/sites when applying patch. Discard old patch when applying new one 28 patch 0   ? oxybutynin XL (DITROPAN-XL) 5 mg 24 hr tablet Take 1 tablet (5 mg total) by mouth daily. 90 tablet 0   ? polyethylene glycol (MIRALAX) 17 gram packet Take 1 packet (17 g total) by mouth daily as needed for constipation. Mix in 8 ounces of water, juice, soda, coffee or tea prior to taking. 14 each 0   ? predniSONE (DELTASONE) 10 mg tablet Take 13 tablets (130 mg total) by mouth daily for 7 days, THEN 12 tablets (120 mg total) daily for 7 days, THEN 11 tablets (110 mg total) daily for 7 days, THEN 10 tablets (100 mg total) daily for 7 days, THEN 9 tablets (90 mg total) daily for 7 days, THEN 8 tablets (80 mg total) daily for 7 days, THEN 7 tablets (70 mg total) daily for 7 days, THEN 6 tablets (60 mg total) daily for 7 days, THEN 5 tablets (50 mg total) daily for 7 days, THEN 4 tablets (40 mg total) daily for 7 days. Take with food.. 595 tablet 0   ? tiotropium (SPIRIVA WITH HANDIHALER) 18 mcg capsule for inhalation Inhale 18 mcg into the lungs daily.     ? albuterol sulfate 90 mcg/actuation HFA aerosol inhaler Inhale 2 puffs into the lungs every 6 (six) hours as needed for wheezing or shortness of breath. 6.7 g 0   ? belladonna alkaloids-opium (B&O SUPPRETTES) 16.2-30 mg suppository Place 1 suppository rectally 2 (two) times daily as needed for bladder spasm. 12 suppository 0   ? famotidine (PEPCID) 20 mg tablet Take 1 tablet (20 mg total) by mouth daily. 30 tablet 0   ? lisinopriL (PRINIVIL,ZESTRIL) 2.5 mg tablet Take 2.5 mg by mouth daily.     ? LORazepam (ATIVAN) 0.5 mg tablet Take 1 tablet (0.5 mg total) by mouth every 6 (six) hours as needed for anxiety. 15 tablet 0   ?  metoprolol succinate XL (TOPROL-XL) 25 mg 24 hr tablet Take 0.5 tablets (12.5 mg total) by mouth daily. Take with or immediately following a meal. 30 tablet 0   ? oxyCODONE (ROXICODONE) 5 mg Immediate Release tablet Take 1 tablet (5 mg total) by mouth every 4 (four) hours as needed for up to 5 doses. 5 tablet 0   ? [START ON 06/28/2020] predniSONE (DELTASONE) 10 mg tablet Take 3 tablets (30 mg total) by mouth daily for 7 days, THEN 2 tablets (20 mg total) daily for 7 days, THEN 1 tablet (10 mg total) daily for 7 days, THEN 0.5 tablets (5 mg total) daily for 7 days, THEN 0.5 tablets (5 mg total) daily for 7 days. Take with food.. 49 tablet 0   ? senna-docusate (SENNA-PLUS) 8.6-50 mg tablet Take 1 tablet by mouth nightly. (Patient not taking: Reported on 05/27/2020) 30 tablet 0   ? simethicone (MYLICON) 80 mg chewable tablet Take 1 tablet (80 mg total) by mouth every 6 (six) hours as needed for flatulence. (Patient not taking: Reported on 05/27/2020) 30 tablet 0   ? sulfamethoxazole-trimethoprim (BACTRIM DS;CO-TRIMOXAZOLE DS) 800-160 mg per tablet Take 1 tablet by mouth Every Monday, Wednesday, and Friday. 28 tablet 0       Objective:   Vitals:  Vitals:    05/28/20 1943 05/28/20 2359 05/29/20 0341 05/29/20 0832   BP: 125/68 124/66 (!) 173/65 (!) 165/90   Pulse: 88 (!) 103 71 (!) 91   Resp: 17 18 17 20    Temp: 98.4 ?F (36.9 ?C) 98 ?F (36.7 ?C) 98 ?F (36.7 ?C) 97.8 ?F (36.6 ?C)   TempSrc: Oral Oral  Oral   SpO2: 94% 96% 96% 95%   Weight:       Height:           Physical Exam:  General: NAD, resting comfortably  HEENT: Sclera anicteric, MMM, oropharynx clear  CV: Regular rate   Pulmonary: Breathing comfortably  Abdomen: Soft, non tender, non distended, no appreciable hepatosplenomegaly  Extremities: Warm, no peripheral edema or clubbing  Skin: No rashes  DRE: light brown stool without palpable masses on exam, skin tag on external inspection    Medications:  SCHEDULED:  Current Facility-Administered Medications   Medication Dose Route Frequency Provider Last Rate Last Admin   ? [Held by provider] aspirin EC delayed release tablet 81 mg  81 mg Oral Daily Alesia Banda, MD   81 mg at 05/29/20 1610   ? [Held by provider] enoxaparin (LOVENOX) syringe 40 mg  40 mg Subcutaneous Q12H Dwyane Luo, MD   40 mg at 05/29/20 9604   ? escitalopram oxalate (LEXAPRO) tablet 10 mg  10 mg Oral Nightly Alesia Banda, MD   10 mg at 05/28/20 2000   ? [Held by provider] famotidine (PEPCID) tablet 20 mg  20 mg Oral Daily Alesia Banda, MD   20 mg at 05/29/20 5409   ? furosemide (LASIX) tablet 20 mg  20 mg Oral Daily Metro Kung, MD       ? gabapentin (NEURONTIN) tablet 600 mg  600 mg Oral Nightly Alesia Banda, MD   600 mg at 05/28/20 2000   ? methylPREDNISolone (Solu-MEDROL) 1 g in sodium chloride 0.9% 100 mL (mini-bag plus)  1 g Intravenous Daily Dwyane Luo, MD 200 mL/hr at 05/29/20 0904 1 g at 05/29/20 0904   ? metoprolol succinate (TOPROL-XL) 24 hr tablet 12.5 mg  12.5 mg Oral Daily Metro Kung, MD       ?  multivitamin with folic acid (THERAGRAN) tablet 1 tablet  1 tablet Oral Daily Alesia Banda, MD   1 tablet at 05/29/20 5409   ? nicotine (NICODERM CQ) transdermal patch 24 hr 7 mg  7 mg Transdermal Daily Alesia Banda, MD   7 mg at 05/29/20 8119   ? oxybutynin XL (DITROPAN-XL) 24 hr tablet 5 mg  5 mg Oral Daily Alesia Banda, MD   5 mg at 05/29/20 1478   ? pantoprazole (PROTONIX) 40 mg in sodium chloride 0.9% PF 10 mL (4 mg/mL)  40 mg IV Push Q12H Metro Kung, MD       ? pantoprazole (PROTONIX) 80 mg in sodium chloride 0.9% PF 20 mL (4 mg/mL)  80 mg IV Push Once Metro Kung, MD       ? rosuvastatin (CRESTOR) tablet 40 mg  40 mg Oral Daily Alesia Banda, MD   40 mg at 05/29/20 2956   ? sodium chloride 0.9 % flush 3 mL  3 mL IV Push Q8H Alesia Banda, MD   3 mL at 05/29/20 0914   ? [START ON 05/30/2020] sulfamethoxazole-trimethoprim (BACTRIM DS;CO-TRIMOXAZOLE DS) 800-160 mg per tablet 1 tablet  1 tablet Oral Once per day on Mon Wed Fri Alesia Banda, MD       ? tamsulosin [facility]) 24 hr capsule 0.4 mg  0.4 mg Oral Nightly Alesia Banda, MD   0.4 mg at 05/28/20 2000   ? tiotropium-olodateroL (STIOLTO RESPIMAT) 2.5-2.5 mcg/actuation mist for inhalation 2 Inhalation  2 Inhalation Inhalation Daily Alesia Banda, MD           PRN:  acetaminophen, albuterol sulfate, carboxymethylcellulose, heparin PF in 0.9 % sodium chloride, LORazepam, melatonin, oxyCODONE, senna, simethicone, sodium chloride, sodium chloride, sodium chloride    Labs:    Recent Labs   Lab 05/27/20  1143 05/28/20  0513 05/29/20  0527   WBC 17.1* 14.3* 14.8*   HGB 10.6* 10.3* 10.1*   HCT 31.5* 31.2* 30.8*   PLT 278 228 248    Recent Labs   Lab 05/27/20  1143 05/28/20  0513 05/29/20  0527   NEUTROPHILS 72.5 80.0 88.9*      Recent Labs   Lab 05/27/20  1143 05/28/20  0513 05/29/20  0527   NA 141 142 144   K 3.6 3.8 3.8   CL 103 104 107   CO2 25 28 25    BUN 32* 27* 32*   CREATININE 1.55* 1.61* 1.41*   GLU 93 101* 226*   ANIONGAP 13 10 12     Recent Labs   Lab 05/27/20  1143 05/28/20  0513 05/29/20  0527   CALCIUM 8.4* 8.4* 8.8   MG 2.1 2.0 2.2   PHOS  --  3.1  --       Recent Labs   Lab 05/27/20  1143 05/27/20  1143 05/28/20  0513 05/29/20  0527   ALT 59  --  45 45   AST 21   < > 13 15 ALKPHOS 76   < > 70 89   BILITOT 0.2   < > 0.4 0.2   BILIDIR  --   --  <0.2  --    PROT 5.6*  --   --  6.2*   ALBUMIN 3.0*  --   --  3.3*    < > = values in this interval not displayed.    Recent Labs   Lab 05/24/20  [phone]  PTT 22.5*   LABPROT 10.3   INR 0.94        No results found for: IRON, TIBC, FERRITIN     Imaging:    TTE 10/3   * Limited study for left ventricular function.  * No regional wall motion abnormalities.  Normal left ventricular systolic function.  LVEF calculated by biplane Simpson's was 66%.  * Normal right ventricular systolic function.  * No evidence of pericardial effusion.  * Compared with the prior study, dated 05/11/2020, there is no significant change.    CTAP w IV contrast 10/2  FINDINGS:  ?  Lung bases: Extensive pulmonary nodules throughout the visualized lung bases measuring up to 2.0 cm in the left lower lobe again noted.  ?  Hepatobiliary: Unremarkable.  ?  Pancreas: Unremarkable.  ?  Spleen: Unremarkable.  ?  Adrenal glands: No change in subcentimeter nodule in the lateral limb of the right adrenal gland.  ?  Kidneys: Similar left renal atrophy with mild hydronephrosis and perinephric fat stranding. There is slight interval decrease in soft tissue at the ureteropelvic junction compared to prior likely reflecting postbiopsy changes. Slight asymmetric hypoenhancement of the left kidney also noted. The right kidney is unremarkable.  ?  Bowel: No bowel obstruction.  ?  Abdominal and pelvic lymph nodes: Stable left paraortic lymph nodes measuring up to 1.2 cm at the level of the kidneys (image 258, series 3). Unchanged numerous subcentimeter aorticocaval and retrocrural lymph nodes. Unchanged left peridiaphragmatic 1.1 cm lymph node.  ?  Peritoneum: No ascites.  ?  Vasculature: No abdominal aortic aneurysm.   ?  Pelvis: Mild wall thickening of the bladder base with nonvisualization of the previously seen bladder tumor consistent with interval resection of the bladder tumor. The prostate is unremarkable.  ?  Musculoskeletal system and soft tissue: No aggressive osseous lesion.  ?  IMPRESSION:  ?  1.  Bladder wall thickening at the base with nonvisualization of the previously seen bladder tumor consistent with interval resection.  2.  Slightly decreased soft tissue at the ureteropelvic junction likely reflecting interval biopsy/resection of the known tumor. Unchanged mild left hydronephrosis with left renal atrophy.  3.  No change in retroperitoneal lymph nodes.  4.  Redemonstration of metastatic lung disease.  Procedural:  None on record    Impression and Recommendations:     New onset black stools with associated pre-syncopal symptoms iso high dose steroids with DAPT is certainly concerning for upper GI bleed. PUD would be most likely, but esophagitis or angioectasia are other reasonable considerations. With that said, GI bleed is not readily apparent, as his frequent use of pepto-bismol confounds interpretation of his reported black stools, DRE without melena/hematochezia, BUN/Crt <30, and slow downtrend in H/H can also be explained by his acute/chronic hematuria.    Upper endoscopy non-urgently is indicated. Patient would prefer to observe clinical course before going forward with a procedure, which is reasonable. Therefore recommend conservative management as detailed below.    -NPO mn for possible EGD 10/4 (if significant H/H drop or clinically evident GIB)  -IV PPI bid  -Check H Pylori stool Ag  -Obtain COVID swab tonight  -H/H trend q12h for now  -Maintain large bore IV x2  -Maintain active t/s  -Ok to continue aspirin from GI perspective  -Avoid pepto-bismol or PO iron, as both can cause black stools  -Request cardiac risk stratification pre-procedure    Case discussed with Dr. Kizzie Bane, final attending attestation to follow. Thank you for this  interesting consult.  Please do not hesitate to contact our team with any questions or concerns    Allayne Gitelman, MD  Clinical Fellow, Gastroenterology and Hepatology  [facility]  Best contact: Mobile Heartbeat; after 5 pm and on weekends please contact the on call fellow as listed on Amion

## 2020-05-29 NOTE — Progress Notes
AOX4, Mild abd discomfort relieved with Simethacone tab: OOB independently with steady gate, AC held, pt stated blood in urine didn't start till after lovenox given; No stool this shift, hat in toilet to collect spec if he goes; IG consulted here on unit to see Pt; Type and screen pending, EKG obtained, Protonics 80 mg given; see flow sheet for further eval, cont to follow

## 2020-05-29 NOTE — Other
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  -  1 - Mobile Heartbeat message sent to Block, P at 10:07 AM. Received response at 1058.  Alycia Rossetti  05/29/2020  10:07 AM  Consult [facility] [phone]

## 2020-05-29 NOTE — Plan of Care
Plan of Care Overview/ Patient Status      69 y.o. 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           CM Assessment and Discharge Planning        Most Recent Value   Case Management Assessment   Do you have a caregiver? No   Patient Requires Care Coordination Intervention Due To discharge planning needs/concerns, change in physical function, readmission within the last 30 days   Arrived from prior to admission home   Bed Hold  n/a   Services Prior to Admission chemotherapy/radiation therapy   Equipment Currently Used at Home CPAP/BiPAP, nebulizer, cane, straight   Documented Insurance Accurate Yes   Any financial concerns related to anticipated discharge needs No   Patient's home address verified Yes   Living Environment    Lives With Alone  [Lives in the in-law part of his niece home]   Current Living Arrangements home/apartment/condo   Home Accessibility bed and bath on same level, no concerns   Transportation Available car, family or friend will provide   Home Safety   Feels Safe Living In Home unable to assess   Source of Clinical History   Patient's clinical history has been reviewed and source of Information is: Medical record   Completed Assessment   Completed Initial Assesment by: Name Anda Latina   Role: Transition Coordinator   CM/SW Attestation: Choose which ONE is appropriate for you   I have reviewed the medical record and agree with the above evaluation by the transition coordinator/discharge planning coordinator with the following recommendations. Yes   Discharge Planning Coordination Recommendations   Discharge Planning Coordination Recommendations Home with Homecare Services          Kathalene Frames, RN, BSN  Care Manager 6-7 EP  Office [phone]  [facility] [phone]

## 2020-05-30 ENCOUNTER — Telehealth: Admit: 2020-05-30 | Payer: PRIVATE HEALTH INSURANCE | Attending: Urology

## 2020-05-30 ENCOUNTER — Encounter: Admit: 2020-05-30 | Payer: PRIVATE HEALTH INSURANCE | Attending: Hematology

## 2020-05-30 ENCOUNTER — Encounter: Admit: 2020-05-30 | Payer: PRIVATE HEALTH INSURANCE

## 2020-05-30 DIAGNOSIS — C7800 Secondary malignant neoplasm of unspecified lung: Secondary | ICD-10-CM

## 2020-05-30 DIAGNOSIS — C679 Malignant neoplasm of bladder, unspecified: Secondary | ICD-10-CM

## 2020-05-30 DIAGNOSIS — G8929 Other chronic pain: Secondary | ICD-10-CM

## 2020-05-30 DIAGNOSIS — R079 Chest pain, unspecified: Secondary | ICD-10-CM

## 2020-05-30 DIAGNOSIS — F39 Unspecified mood [affective] disorder: Secondary | ICD-10-CM

## 2020-05-30 DIAGNOSIS — F17210 Nicotine dependence, cigarettes, uncomplicated: Secondary | ICD-10-CM

## 2020-05-30 DIAGNOSIS — Z7952 Long term (current) use of systemic steroids: Secondary | ICD-10-CM

## 2020-05-30 DIAGNOSIS — Z79899 Other long term (current) drug therapy: Secondary | ICD-10-CM

## 2020-05-30 DIAGNOSIS — Z7901 Long term (current) use of anticoagulants: Secondary | ICD-10-CM

## 2020-05-30 DIAGNOSIS — Z7982 Long term (current) use of aspirin: Secondary | ICD-10-CM

## 2020-05-30 DIAGNOSIS — E1122 Type 2 diabetes mellitus with diabetic chronic kidney disease: Secondary | ICD-10-CM

## 2020-05-30 DIAGNOSIS — R1030 Lower abdominal pain, unspecified: Secondary | ICD-10-CM

## 2020-05-30 DIAGNOSIS — N189 Chronic kidney disease, unspecified: Secondary | ICD-10-CM

## 2020-05-30 DIAGNOSIS — R109 Unspecified abdominal pain: Secondary | ICD-10-CM

## 2020-05-30 DIAGNOSIS — R778 Other specified abnormalities of plasma proteins: Secondary | ICD-10-CM

## 2020-05-30 DIAGNOSIS — J439 Emphysema, unspecified: Secondary | ICD-10-CM

## 2020-05-30 DIAGNOSIS — G4700 Insomnia, unspecified: Secondary | ICD-10-CM

## 2020-05-30 DIAGNOSIS — G4733 Obstructive sleep apnea (adult) (pediatric): Secondary | ICD-10-CM

## 2020-05-30 DIAGNOSIS — I2510 Atherosclerotic heart disease of native coronary artery without angina pectoris: Secondary | ICD-10-CM

## 2020-05-30 DIAGNOSIS — K922 Gastrointestinal hemorrhage, unspecified: Secondary | ICD-10-CM

## 2020-05-30 DIAGNOSIS — R197 Diarrhea, unspecified: Secondary | ICD-10-CM

## 2020-05-30 DIAGNOSIS — E785 Hyperlipidemia, unspecified: Secondary | ICD-10-CM

## 2020-05-30 DIAGNOSIS — I429 Cardiomyopathy, unspecified: Secondary | ICD-10-CM

## 2020-05-30 DIAGNOSIS — Z955 Presence of coronary angioplasty implant and graft: Secondary | ICD-10-CM

## 2020-05-30 DIAGNOSIS — M419 Scoliosis, unspecified: Secondary | ICD-10-CM

## 2020-05-30 DIAGNOSIS — Z9989 Dependence on other enabling machines and devices: Secondary | ICD-10-CM

## 2020-05-30 DIAGNOSIS — M25559 Pain in unspecified hip: Secondary | ICD-10-CM

## 2020-05-30 DIAGNOSIS — Z9221 Personal history of antineoplastic chemotherapy: Secondary | ICD-10-CM

## 2020-05-30 DIAGNOSIS — I5032 Chronic diastolic (congestive) heart failure: Secondary | ICD-10-CM

## 2020-05-30 DIAGNOSIS — I252 Old myocardial infarction: Secondary | ICD-10-CM

## 2020-05-30 DIAGNOSIS — I951 Orthostatic hypotension: Secondary | ICD-10-CM

## 2020-05-30 DIAGNOSIS — M25569 Pain in unspecified knee: Secondary | ICD-10-CM

## 2020-05-30 DIAGNOSIS — K219 Gastro-esophageal reflux disease without esophagitis: Secondary | ICD-10-CM

## 2020-05-30 DIAGNOSIS — I130 Hypertensive heart and chronic kidney disease with heart failure and stage 1 through stage 4 chronic kidney disease, or unspecified chronic kidney disease: Secondary | ICD-10-CM

## 2020-05-30 DIAGNOSIS — Z515 Encounter for palliative care: Secondary | ICD-10-CM

## 2020-05-30 LAB — COMPREHENSIVE METABOLIC PANEL
BKR A/G RATIO: 1.3 (ref 1.0–2.2)
BKR ALANINE AMINOTRANSFERASE (ALT): 45 U/L (ref 9–59)
BKR ALBUMIN: 3.3 g/dL — ABNORMAL LOW (ref 3.6–4.9)
BKR ALKALINE PHOSPHATASE: 96 U/L (ref 9–122)
BKR ANION GAP: 12 (ref 7–17)
BKR ASPARTATE AMINOTRANSFERASE (AST): 26 U/L (ref 10–35)
BKR AST/ALT RATIO: 0.6
BKR BILIRUBIN TOTAL: 0.2 mg/dL (ref <=1.2)
BKR BLOOD UREA NITROGEN: 31 mg/dL — ABNORMAL HIGH (ref 8–23)
BKR BUN / CREAT RATIO: 21.8 % (ref 8.0–23.0)
BKR CALCIUM: 8.5 mg/dL — ABNORMAL LOW (ref 8.8–10.2)
BKR CHLORIDE: 108 mmol/L — ABNORMAL HIGH (ref 98–107)
BKR CO2: 26 mmol/L (ref 20–30)
BKR CREATININE: 1.42 mg/dL — ABNORMAL HIGH (ref 0.40–1.30)
BKR EGFR (AFR AMER): 60 mL/min/{1.73_m2} (ref >60)
BKR EGFR (NON AFRICAN AMERICAN): 50 mL/min/{1.73_m2} (ref >60)
BKR GLOBULIN: 2.6 g/dL (ref 2.3–3.5)
BKR GLUCOSE: 168 mg/dL — ABNORMAL HIGH (ref 70–100)
BKR POTASSIUM: 3.8 mmol/L (ref 3.3–5.1)
BKR PROTEIN TOTAL: 5.9 g/dL — ABNORMAL LOW (ref 6.6–8.7)
BKR SODIUM: 146 mmol/L — ABNORMAL HIGH (ref 136–144)

## 2020-05-30 LAB — CBC WITH AUTO DIFFERENTIAL
BKR WAM ABSOLUTE NRBC: 0 x 1000/ÂµL (ref 0.0–0.0)
BKR WAM BANDS (DIFF): 29.4 % — ABNORMAL LOW (ref 37.0–52.0)
BKR WAM HEMATOCRIT: 29.4 % — ABNORMAL LOW (ref 37.0–52.0)
BKR WAM HEMOGLOBIN: 9.9 g/dL — ABNORMAL LOW (ref 12.0–18.0)
BKR WAM MCH (PG): 31.8 pg — ABNORMAL HIGH (ref 27.0–31.0)
BKR WAM MCHC: 33.7 g/dL (ref 31.0–36.0)
BKR WAM MCV: 94.5 fL — ABNORMAL HIGH (ref 78.0–94.0)
BKR WAM MPV: 9.8 fL (ref 6.0–11.0)
BKR WAM NUCLEATED RED BLOOD CELLS: 0 % (ref 0.0–1.0)
BKR WAM PLATELETS: 264 x1000/ÂµL (ref 140–440)
BKR WAM RDW-CV: 14.6 % — ABNORMAL HIGH (ref 11.5–14.5)
BKR WAM RED BLOOD CELL COUNT: 3.1 M/??L — ABNORMAL LOW (ref 3.8–5.9)
BKR WAM WHITE BLOOD CELL COUNT: 21 x1000/ÂµL — ABNORMAL HIGH (ref 4.0–10.0)

## 2020-05-30 LAB — MANUAL DIFFERENTIAL
BKR WAM BASOPHIL - ABS (DIFF): 0 x 1000/??L (ref 0.0–0.0)
BKR WAM BASOPHILS (DIFF): 0 % (ref 0.0–4.0)
BKR WAM EOSINOPHIL - ABS (DIFF): 0 x 1000/??L — ABNORMAL HIGH (ref 0.0–1.0)
BKR WAM EOSINOPHILS (DIFF): 0 % — ABNORMAL LOW (ref 0.0–7.0)
BKR WAM LYMPHOCYTE - ABS (DIFF): 0.4 x 1000/ÂµL — ABNORMAL LOW (ref 1.0–4.0)
BKR WAM LYMPHOCYTES (DIFF): 2 % — ABNORMAL LOW (ref 8.0–49.0)
BKR WAM MONOCYTE - ABS (DIFF): 0.2 x 1000/ÂµL (ref 0.0–2.0)
BKR WAM MONOCYTES (DIFF): 1 % — ABNORMAL LOW (ref 4.0–15.0)
BKR WAM NEUTROPHILS (DIFF): 96 % — ABNORMAL HIGH (ref 37.0–84.0)
BKR WAM NEUTROPHILS - ABS (DIFF): 20.6 x 1000/??L — ABNORMAL HIGH (ref 1.0–11.0)
BKR WAM NORMAL RBC MORPHOLOGY: NORMAL

## 2020-05-30 LAB — TROPONIN T     (Q): BKR TROPONIN T: 0.07 ng/mL — ABNORMAL HIGH (ref <0.01)

## 2020-05-30 LAB — COVID-19 CLEARANCE OR FOR PLACEMENT ONLY: BKR SARS-COV-2 RNA (COVID-19) (YH): NEGATIVE

## 2020-05-30 LAB — H. PYLORI ANTIGEN, STOOL: BKR H PYLORI ANTIGEN: NEGATIVE

## 2020-05-30 MED ORDER — PREDNISONE 10 MG TABLET
10 mg | ORAL_TABLET | ORAL | 1 refills | Status: AC
Start: 2020-05-30 — End: 2020-06-28

## 2020-05-30 MED ORDER — PANTOPRAZOLE 40 MG TABLET,DELAYED RELEASE
40 mg | ORAL_TABLET | Freq: Every day | ORAL | 3 refills | Status: AC
Start: 2020-05-30 — End: ?

## 2020-05-30 MED ORDER — FUROSEMIDE 40 MG TABLET
40 mg | ORAL_TABLET | Freq: Every day | ORAL | 3 refills | Status: SS
Start: 2020-05-30 — End: 2020-06-23

## 2020-05-30 MED ORDER — WALKER
1 refills | Status: AC
Start: 2020-05-30 — End: ?

## 2020-05-30 MED ORDER — PANTOPRAZOLE 40 MG TABLET,DELAYED RELEASE
40 mg | Freq: Every day | ORAL | Status: DC
Start: 2020-05-30 — End: 2020-05-31

## 2020-05-30 MED ORDER — PREDNISONE 10 MG TABLET
10 mg | ORAL_TABLET | ORAL | 1 refills | Status: AC
Start: 2020-05-30 — End: ?

## 2020-05-30 MED ORDER — CALCIUM CARBONATE 500 MG-VITAMIN D3 10 MCG (400 UNIT) CHEWABLE TABLET
500 mg-10 mcg (400 unit) | ORAL_TABLET | Freq: Two times a day (BID) | ORAL | 3 refills | Status: AC
Start: 2020-05-30 — End: ?

## 2020-05-30 MED ORDER — TAMSULOSIN 0.4 MG CAPSULE
0.4 mg | ORAL_CAPSULE | Freq: Every evening | ORAL | 3 refills | Status: AC
Start: 2020-05-30 — End: ?

## 2020-05-30 NOTE — Telephone Encounter
I called patient to discuss pathology results from the TURBT and left ureteral biopsy as indicated below:FINAL DIAGNOSIS 1. BLADDER, LEFT LATERAL WALL AND TRIGONE, TRANSURETHRAL RESECTION: ? ? ?- INVASIVE PAPILLARY UROTHELIAL CARCINOMA, HIGH GRADE ? ? ?- TUMOR INVADES INTO LAMINA PROPRIA ? ? ?- MUSCULARIS PROPRIA IDENTIFIED BUT NOT INVOLVED BY TUMOR 2. LEFT URETER TUMOR, BIOPSY: ? ? ?- SUSPICIOUS FOR HIGH GRADE UROTHELIAL CARCINOMA (SEE NOTE) It turns out he is currently in the hospital on the medical service being evaluated for syncope and melena.  I am going to have him see one of our GU oncologists for further management and he agrees.

## 2020-05-30 NOTE — Care Coordination-Inpatient
I identified my role as CM or TC from Care Management department .  Medicare Important Message explained to patient over the phone.  Patient stated understanding the notice and was in agreement with the discharge plan.  Patient advised of the right to call the [employer] ([employer]) at [phone] if not in agreement with the discharge from the hospital.  Copy mailed to patient after confirming address.Paul Hunter  Paul Hunter		Transition CoordinatorYork Street CampusEp-7-5 and Np-10(O) :[phone](M) :[phone](F) :[phone]

## 2020-05-30 NOTE — Plan of Care
Problem: Adult Inpatient Plan of CareGoal: Plan of Care ReviewOutcome: Interventions implemented as appropriateGoal: Patient-Specific Goal (Individualized)Outcome: Interventions implemented as appropriateGoal: Absence of Hospital-Acquired Illness or InjuryOutcome: Interventions implemented as appropriateGoal: Optimal Comfort and WellbeingOutcome: Interventions implemented as appropriateGoal: Readiness for Transition of CareOutcome: Interventions implemented as appropriate Problem: Fall Injury RiskGoal: Absence of Fall and Fall-Related InjuryOutcome: Interventions implemented as appropriate Plan of Care Overview/ Patient Status    [phone] Paul Hunter was discharged via [employer] accompanied by [employer].  Acknowledged understanding of discharge instructionsand recommended follow up care as per the after visit summary.  Written discharge instructions provided. Denies any further questions. Vital signs    Vitals:  05/30/20 0000 05/30/20 0600 05/30/20 0803 05/30/20 1338 BP: (!) 147/58 (!) 145/79 (!) 163/95 (!) 157/76 Pulse: 85 74 90 75 Resp: 18 18 18 18  Temp: 97 ?F (36.1 ?C) 97.9 ?F (36.6 ?C) 97.7 ?F (36.5 ?C) 97.7 ?F (36.5 ?C) TempSrc: Oral Oral Oral Oral SpO2: 97% 97% 94% 94% Weight:     Height:     Patient is alert and oriented x4. VSS. Afebrile. Satting well on roomair. Refusing CPAP at night. Cont of B&B, dribbling at times. LBM 10/4. No blood noted to stool/urine. Good appetite/good intake. TELE removed. Skin intact. Safety maintained at all times. Will CTM.

## 2020-05-30 NOTE — Treatment Summary
Brief Cardiology Update Note:Patient was seen overnight for pre-op evaluation for endoscopy to evaluate for GI bleeding. As stated in that note by Dr. Yvetta Coder (to be attested by attending Dr. Toni Amend), the patient is a high-risk patient who was due to undergo a low-risk procedure. At this time, we do not recommend further testing as he has no signs/symptoms to suggest decompensated heart failure or ACS, and due to ongoing concerns about his ability to tolerate DAPT.  Primary team has reached back out today with additional question of rising troponin. Troponin has trended 0.05>0.06>0.05>0.04>0.07. These mild fluctuations in troponin in a patient with CKD are likely not clinically significant at this time as the patient has no symptoms to suggest ACS and has an ECG that does not reveal any new ischemic changes. We do not recommend further evaluation for these small fluctuations in troponin elevation in the absence of concerning symptoms. - No further evaluation or intervention recommended from troponin elevation- Patient has follow up scheduled with Cardio-oncology on 10/8

## 2020-05-30 NOTE — Progress Notes
[facility] Progress NoteInterval events:Yesterday: - Consulted Cards - plan for IV solumedrol 1g x3 days then restarting taper - Ordered TTE for evaluation of systolic function - Orthostasis resolved with fluids - Stool studies sent (but BMs improved anyways) Overnight:-1-2 episodes of melanotic stools, black - this had never happened to him before This morning: -Stating he is feeling much better, GI distress resolving with simethicone-Had been up and walking around the room w/ cane, no orthostasis -Asking if he could go home-Adamantly refusing to be back on plavix Medications: Current Facility-Administered Medications Medication Dose Route Frequency Provider Last Rate Last Admin ? [Held by provider] aspirin EC delayed release tablet 81 mg  81 mg Oral Daily Alesia Banda, MD   81 mg at 05/29/20 1610 ? [Held by provider] enoxaparin (LOVENOX) syringe 40 mg  40 mg Subcutaneous Q12H Dwyane Luo, MD   40 mg at 05/29/20 9604 ? escitalopram oxalate (LEXAPRO) tablet 10 mg  10 mg Oral Nightly Alesia Banda, MD   10 mg at 05/28/20 2000 ? [Held by provider] famotidine (PEPCID) tablet 20 mg  20 mg Oral Daily Alesia Banda, MD   20 mg at 05/29/20 5409 ? furosemide (LASIX) tablet 20 mg  20 mg Oral Daily Metro Kung, MD   20 mg at 05/29/20 1105 ? gabapentin (NEURONTIN) tablet 600 mg  600 mg Oral Nightly Alesia Banda, MD   600 mg at 05/28/20 2000 ? methylPREDNISolone (Solu-MEDROL) 1 g in sodium chloride 0.9% 100 mL (mini-bag plus)  1 g Intravenous Daily Dwyane Luo, MD 200 mL/hr at 05/29/20 0904 1 g at 05/29/20 0904 ? metoprolol succinate (TOPROL-XL) 24 hr tablet 12.5 mg  12.5 mg Oral Daily Metro Kung, MD   12.5 mg at 05/29/20 1105 ? multivitamin with folic acid (THERAGRAN) tablet 1 tablet  1 tablet Oral Daily Alesia Banda, MD   1 tablet at 05/29/20 8119 ? nicotine (NICODERM CQ) transdermal patch 24 hr 7 mg  7 mg Transdermal Daily Alesia Banda, MD   7 mg at 05/29/20 1478 ? oxybutynin XL (DITROPAN-XL) 24 hr tablet 5 mg  5 mg Oral Daily Alesia Banda, MD   5 mg at 05/29/20 2956 ? pantoprazole (PROTONIX) 40 mg in sodium chloride 0.9% PF 10 mL (4 mg/mL)  40 mg IV Push Q12H Metro Kung, MD     ? rosuvastatin (CRESTOR) tablet 40 mg  40 mg Oral Daily Alesia Banda, MD   40 mg at 05/29/20 2130 ? sodium chloride 0.9 % flush 3 mL  3 mL IV Push Q8H Alesia Banda, MD   3 mL at 05/29/20 0914 ? [START ON 05/30/2020] sulfamethoxazole-trimethoprim (BACTRIM DS;CO-TRIMOXAZOLE DS) 800-160 mg per tablet 1 tablet  1 tablet Oral Once per day on Mon Wed Fri Alesia Banda, MD     ? tamsulosin [facility]) 24 hr capsule 0.4 mg  0.4 mg Oral Nightly Alesia Banda, MD   0.4 mg at 05/28/20 2000 ? tiotropium-olodateroL (STIOLTO RESPIMAT) 2.5-2.5 mcg/actuation mist for inhalation 2 Inhalation  2 Inhalation Inhalation Daily Alesia Banda, MD     Exam:VS reviewedBP (!) 168/85  - Pulse 82  - Temp 97.8 ?F (36.6 ?C) (Oral)  - Resp 20  - Ht 6' (1.829 m)  - Wt (!) 138.3 kg  - SpO2 94%  - BMI 41.37 kg/m? Physical Exam:General: well appearing, in no distress Eyes: EOMINeck: unable to appreciate JVD d/t neck circumference CV:  regular rate and rhythm, normal S1/S2Pulm: Crackles in the RLL Abd: softly distended w/o rebound/guarding MSK: motor grossly intact, 1+ pitting edema BLE, more than previous daysNeuro: CN II-XII grossly intact, sensation grossly intact, AOx3Skin: warm and dry, no rashes, lesions, purpura Labs:All recent labs reviewed, notable ZOX:WRUEAV Labs Lab 10/01/211143 10/02/210513 10/03/210527 WBC 17.1* 14.3* 14.8* HGB 10.6* 10.3* 10.1* HCT 31.5* 31.2* 30.8* PLT 278 228 248  Recent Labs Lab 10/01/211143 10/02/210513 10/03/210527 NEUTROPHILS 72.5 80.0 88.9*  Recent Labs Lab 10/01/211143 10/02/210513 10/03/210527 NA 141 142 144 K 3.6 3.8 3.8 CL 103 104 107 CO2 25 28 25  BUN 32* 27* 32* CREATININE 1.55* 1.61* 1.41* GLU 93 101* 226* ANIONGAP 13 10 12   Recent Labs Lab 10/01/211143 10/02/210513 10/03/210527 CALCIUM 8.4* 8.4* 8.8 MG 2.1 2.0 2.2 PHOS  --  3.1  --   Recent Labs Lab 10/01/211143 10/02/210513 10/03/210527 ALT 59 45 45 AST 21 13 15  ALKPHOS 76 70 89 BILITOT 0.2 0.4 0.2 BILIDIR  --  <0.2  --   Recent Labs Lab 09/28/210529 PTT 22.5* LABPROT 10.3 INR 0.94  Imaging:All recent imaging reviewed.Assessment/Plan:Mr. 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 Hospital course c/b melanotic stools x2, now being worked up for GIB.#Pre-syncopeEvent likely orthostatic in nature given rise from sitting to standing, positive orthostatic vitals and history suggestive for hypovolemia (diarrhea, recent addition of diuretics). 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 Underwent PET stress on 9/10 showing large reversible perfusion deficits and LVEF drop to 36-40% so started on DAPT given concern for significant CAD component as well. Unfortunately unable to tolerate DAPT d/t hematuria- Cards consulted, appreciate recs- Restart metoprolol 12.5mg  qd- Restart lasix 20mg  qd (given crackles on exam, home dose 40mg  qd) - Plan for 3 days of IV methylprednisolone 1g --> PO prednisone weekly taper starting at 130mg  (+Bactrim MWF for ppx), day 2/3 today- Continue telemetry- CTM Daily troponin and EKG - Continue statin- Holding ASA iso presumed GIB- Ok to resume Plavix if cleared by Urology (per Urology, ok if no hematuria), however will continue to hold iso c/f GIB - Obtain limited TTE for evaluation of systolic function #Diarrhea now w/ melanotic stools - 2 episodes of dark stools over the last 24 hours. Currently HDS w/ stable Hb. However given malignancy hx, previous steroid use, previous AC use will pursue GIB w/u. Originally presenting w/ diarrhea however stool pathogens + O/P negative. -GI consulted, appreciate recs-80 IV pantoprazole once, then 40 IV pantoprazole BID -F/u H.pylori stool testing-F/u Cdiff + O&P to complete diarrheal w/u-Hb goal >8 -Maintain Active T&S-Hold ASA, lovenox ppx and plavix iso GIB-If stools, please notify MD to capture media for chart#NSCLC#COPDSignificant smoking history (75pack year), still current smoker. Dx 06/2019 in [facility], started on carbo/taxol/Pembro (11/2019) before transition of care to [facility]. 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 Recent plan to transition to [facility] maintenance stopped  d/t concern for ICI toxicity. Underwent repeat EBUS w/ bx on 9/28 for re-evaluation (two separate primaries vs malignant bladder cancer) - Continue Stiolto daily - Nicotine patch for cravings - F/up path results #Bladder cancer Intermittent hematuria for several years, recently worsened on DAPT c/b clotting and urinary retention. 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 - Continue bladder scans to r/o retention- Continue oxybutynin 5mg  for now - If restarting Plavix, monitor closely for hematuria/retention #Pain management: continue gabapentin 600mg  nightly, Tylenol and oxycodone PRN #Mood: continue escitalopram 10mg  nightly CODE STATUS: FULL CODEDiet: CardiacDVT ppx: LovenoxDispo: floor Farris Has, MDPGY3 - Internal Medicine - Primary CareCan be found on MHBOctober 3, 20211:38 PM

## 2020-05-30 NOTE — Care Coordination-Inpatient
Transition Coordinator Discharge Note    This Clinical research associate contacted the pt to see if he wanted any HCS in the home.    The pt stated he has family support and a cane he doesn't need a RW, so no need for any svcs or dme.    Update at 2:50 pm    After the pt took a short walk w/PT he felt that he could benefit from the RW after all.     CM will provide a RW to not hold up the dc, [employer] will deliver the RW to CM office on 7-5.    Joycelin Radloff [facility]    Transition Coordinator  [address] [facility]  Ep-7-5 and Np-10  [phone]) [phone]  (M) :[phone]  (F) [phone]

## 2020-05-30 NOTE — Progress Notes
[facility]  GI Progress note     Subjective/interim events:   Had large formed non bloody BM, blood tinged urine   ROS:   14 point ROS performed with pertinent findings noted above    Medications:  Reviewed and notable for:   Solu-medrol   ASA 81  PPI BID   Metoprolol   Bactrim   Flomax and oxybutynin     Objective:  Physical Exam:   BP: 145/79 HR: 74  General: NAD, AAOx3  HEENT: anicteric  Pulm: breathing comfortably   CV: RR  Abd: soft, NT, ND, +BS  Skin:  no jaundice   Neuro: alert and oriented x3     Labs:   Reviewed and notable for:   Chem stable   Worsening leukocytosis (neutrophil predominant)   Stable H/H 10.1 --> 9.9     Micro:   Stool pathogen by PCR: neg       Imaging:   CTAP w IV contrast 10/2  1. ?Bladder wall thickening at the base with nonvisualization of the previously seen bladder tumor consistent with interval resection.  2. ?Slightly decreased soft tissue at the ureteropelvic junction likely reflecting interval biopsy/resection of the known tumor. Unchanged mild left hydronephrosis with left renal atrophy.  3. ?No change in retroperitoneal lymph nodes.  4. ?Redemonstration of metastatic lung disease.      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 GI consulted for new onset black stools. However frequent use of pepto-bismol confounds interpretation of his reported black stools, DRE without melena/hematochezia, BUN/Crt <30, and slow downtrend in H/H can also be explained by his acute/chronic hematuria. Upper endoscopy non-urgently is indicated. Patient would prefer to observe clinical course before going forward with a procedure, which is reasonable. Therefore recommend conservative management as detailed below.  ?  -can transition to PPI daily   -Ok to continue aspirin from GI perspective  -Avoid pepto-bismol or PO iron, as both can cause black stools  -will defer EGD given no evidence of GIB currently, and stable H/H  - GI will sign off, please let us know if any questions or concerns     Discussed with Dr. Elnoria Howard    Signed:  Inocente Salles, M.D. PGY-6  Gastroenterology Fellow  Preferred contact: Mobile heartbeat  05/30/2020 7:18 AM

## 2020-05-30 NOTE — Care Coordination-Inpatient
CM Discharge Planning    Most Recent Value Discharge Planning Concerns to be Addressed denies needs/concerns at this time, patient refuses services Referral(s) placed for: None Patient/Patient Representative goals/treatment preferences for discharge are:  home with MD f/u Home Health Care Services Required N/A Patient is considered homebound due to: (he/she requires considerable and taxing effort to leave their residence for medical reasons or religious services OR infrequently OR of short duration for other reasons) n/a Equipment Needed After Discharge none Mode of Transportation  Private car  (add comment for special considerations) Patient to be accompanied by family member Is the patient discharging to their home address Yes CM D/C Readiness PASRR completed and approved N/A Authorization number obtained, if required N/A Is there a 3 day INPATIENT Qualifying stay for Medicare Patients? N/A Medicare IM- signed, dated, timed and scanned, if required N/A DME Authorized/Delivered N/A No needs identified/ follow up with PCP/MD Yes Post acute care services secured W10 complete N/A Pri Completed and Accepted  N/A Finalized Plan Expected Discharge Date 05/30/20 Discharge Disposition Home or Self Care Post acute care services secured W10 complete N/A Physician documentation required AVS (Patient Instructions), Prescriptions

## 2020-05-30 NOTE — Telephone Encounter
Patient is calling in stating that he just missed a call from Dr. Malen Gauze and he would like to speak with him.  Please call to advise.

## 2020-05-30 NOTE — Other
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  -  1 - Mobile Heartbeat message sent to Miller, r at 10:55 AM. Received response at 1056.  Alycia Rossetti  05/30/2020  10:55 AM  Consult Connect Center [phone]

## 2020-05-30 NOTE — Discharge Instructions
Dear Paul Hunter, You were admitted to the hospital after an event of nearly passing out on the way to your Pulmonology appointment. This was most likely due to orthostatic hypotension and dehydration (possibly from your new water pill and recent episodes of diarrhea). You were given IV fluids with resolution of your symptoms. When you were admitted, your troponin level was elevated (one of the cardiac enzymes that gets released when the heart is stressed). This was likely related to your history of coronary artery disease and the toxicity/side effect from one of the medications you were previously on for your lung cancer. Because of this, your steroid doses were increased in order to treat the toxicity from the previous cancer drug. You will be discharged on a new steroid taper starting on October 5 at prednisone dose of 130mg  daily for one week. You will decrease your dose of steroids by 10mg  each Tuesday thereafter (ie. Would decrease to prednisone 120mg  on Tuesday, October 12). You were started on a new medication called pantoprazole due to concern for mild bleeding from your gastrointestinal tract. This will also help protect against stomach ulcers while you are on the steroids. Some of your cardiac medications were held on admission because of low blood pressures and concern for dehydration. Your water pill called furosemide was restarted at a lower dose on discharge (20mg  instead of 40mg ). Ask your cardiologist at your follow-up appointment this week whether they would like you to increase the dose of this medication again. Please, call your doctor and/or return to the hospital if you experience another episode of light-headedness like you are going to pass out, if you have blood in your stool or urine, or develop chest pain. Here is a list of your upcoming appointments: Future Appointments     Provider Department Center  06/02/2020 8:00 AM [facility] SHORT TREATMENT [facility] at [address] [facility] [place] M  06/02/2020 9:00 AM Nikki Dom, MD [facility] Thoracic Oncology Program at [facility] at [address] [facility] [place] M  06/03/2020 11:00 AM Mulinski, Theotis Burrow., APRN [facility] Onco-Oncology Program at [facility] [place] [facility] [place] M  06/21/2020 12:00 PM [facility] MR MRC [address] [place] [facility] [place] MRI [facility] Rad  06/30/2020 4:30 PM Catalina Antigua., MD [facility] Urology at [address] [facility] Y[facility] CAD  08/12/2020 10:00 AM Quillian Quince, [place], MD [facility] CAD  It was a pleasure taking care of you!Sincerely, Your [facility] Team You will be receiving a phone call from the Transition Care Program for the purpose of reviewing your discharge instructions, medications, follow-up appointments and to answer any questions you may have about managing your self-care.  Should you need to contact us please call [phone] and leave a brief message with your concern including your name and number and your call will be returned.  We are available WEEKDAYS, Monday to Friday at  8:00 am - 4:00pm.  Messages left after 4pm will be returned the following day. On weekends please call your doctor with any questions or concerns.If your concern is of an urgent matter please contact your PCP or call 911. Thank you

## 2020-05-30 NOTE — Care Coordination-Inpatient
Transition Care Manager NotePatient was identified as appropriate and was enrolled in the Transition Care Manager Program during his 9/13-9/16 admission. Patient was evaluated in the ED on 9/20 for evaluation of hematuria; readmitted 9/23-9/24 with urinary retention; readmitted 9/25-9/29 dx urinary retention; and now readmitted 10/1 for pre-syncopal evaluation. Patient will continue to be followed by the TCM Program until 10/16, 30 days after his 9/16 discharge.Dian Queen, RN, [phone]

## 2020-05-30 NOTE — Progress Notes
HF Navigator (Medicine Floor)Patient Paul Hunter currently admitted with primary or supporting diagnosis of heart failureChart reviewed by Heart Failure Nurse NavigatorHFpEF currently 66% (05/29/2020) Patient primary cardiologist is Dr. Meridee Score Recommend the following interventions for further HF management upon discharge: Consider referral to Outpatient Heart Failure Disease Management (HFDM), available through discharge orders tab ref 131 (referral to heart failure disease management OR comprehensive heart failure service).(Outpatient disease management is a nurse driven disease management program offering a tailored approach to best practices and clinical guidelines. The HFDM program follows patients for 30-90 days post admission to educate, coordinate, as assess patients during the post acute hospitalization time period.  The program involves patient education on diet, medication and exercise with oversight from a nurse practitioner.  Visits alternate around the patients' visits with primary cardiologist in order to act as another level of support and check-in with the patient.  The program usually involves 2-6 visits over the previous mentioned 30-90 day time period, and focuses on preventing readmission and empowering patients to be an active participant in their health.  The program also facilitates the collaboration between HF, primary care, and other community providers).Electronically Signed by Laural Roes, RN, May 30, 2020 1:18 PMHeart Failure Nurse [phone] or MHB

## 2020-05-30 NOTE — Plan of Care
Problem: Adult Inpatient Plan of CareGoal: Plan of Care ReviewOutcome: Interventions implemented as appropriate Problem: Adult Inpatient Plan of CareGoal: Patient-Specific Goal (Individualized)Outcome: Interventions implemented as appropriate Problem: Adult Inpatient Plan of CareGoal: Absence of Hospital-Acquired Illness or InjuryOutcome: Interventions implemented as appropriate Problem: Adult Inpatient Plan of CareGoal: Optimal Comfort and WellbeingOutcome: Interventions implemented as appropriate Problem: Adult Inpatient Plan of CareGoal: Readiness for Transition of CareOutcome: Interventions implemented as appropriate Problem: Fall Injury RiskGoal: Absence of Fall and Fall-Related InjuryOutcome: Interventions implemented as appropriate Plan of Care Overview/ Patient Status    1900-2300 Pt is A&Ox4, RA, VSS and afebrile. Sinus Rhythm on Tele with PVCs. Denies pain/discomfort at this time. Abdomen is distended, soft, non tender. BS +x4 quadrants. Large formed BM this shift . Voided blood tinged urine x1 this shift and clear yellow urine this am. Independent with ambulation and ADLs. Possible plan for scope procedure in am. NPO since midnight. Pt safety maintained. Please see flow sheet for further details.Pearson Grippe, RN

## 2020-05-30 NOTE — Plan of Care
Inpatient Physical Therapy Progress Note IP Adult PT Eval/Treat - 05/30/20 1440    Date of Visit / Treatment  Date of Visit / Treatment 05/30/20   End Time 1440    General Information  Subjective The prednisone has been helping with my back pain   General Observations pt received in supine on RA in NAD. PT session cleared by RN   Precautions/Limitations fall precautions    Vital Signs and Orthostatic Vital Signs  Vital Signs Vital Signs Stable   Vital Signs Free text HR increased to 105 after ambualtion, return to 90s upon sitting at EOB    Pain/Comfort  Pain Comment (Pre/Post Treatment Pain) reports chronic back pain referring into BLE. Reports that prednisone has helped but continues to experience pain in b/l quads. RN aware    Cognition  Overall Cognitive Status WFL   Orientation Level Oriented X4   Level of Consciousness alert   Following Commands Follows one step commands without difficulty   Personal Safety / Judgment Fall risk;Requires cues/assistance to recognize deficits   Cognition Comments cues to pace activity for safety    Skin Assessment  Skin Assessment See Nursing Documentation    Balance  Sitting Balance: Static  GOOD     Maintains static position against moderate resistance with no Assistive Device   Sitting Balance: Dynamic  GOOD    Independent in functional balance activities (ambulates on unlevel surfaces, turns, catches ball)   Standing Balance: Static GOOD-    Maintains static position against minimal resistance with no Assistive Device   Standing Balance: Dynamic  FAIR+    Performs dynamic activities through full range with Supervision   Balance Assist Device Straight cane    Bed Mobility  Supine-to-Sit Independence/Assistance Level Modified independence   Supine-to-Sit Assist Device Bed rails   Sit-to-Supine Independence/Assistance Level Modified independence   Sit-to-Supine Assist Device Bed rails Sit-Stand Transfer Training  Sit-to-Stand Transfer Independence/Assistance Level Modified independence   Sit-to-Stand Transfer Assist Device Straight cane   Stand-to-Sit Transfer Independence/Assistance Level Modified independence   Stand-to-Sit Transfer Assist Device Straight cane   Sit-Stand Transfer Comments steady in stance    Gait Training  Independence/Assistance Level  Contact guard   Assistive Device  Straight cane   Gait Distance 40 feet;x2   Gait Analysis Deviations decreased cadence;wide base of support   Gait Training Comments mild unsteadiness with cane with no overt LOB. Improved with cueing for sequencing and to pace activity.    Stair Agricultural consultant Comment pt declined trialing stairs 2/2 fatigue. Pt reports first floor set up is available.    Handoff Documentation  Handoff Patient in bed;Patient instructed to call nursing for mobility;Discussed with nursing    Endurance  Endurance Comments fair    PT- AM-PAC - Basic Mobility Screen- How much help from another person do you currently need.....  Turning from your back to your side while in a a flat bed without using rails? 3 - A Little - Requires a little help (supervision, minimal assistance). Can use assistive devices.   Moving from lying on your back to sitting on the side of a flat bed without using bed rails? 3 - A Little - Requires a little help (supervision, minimal assistance). Can use assistive devices.   Moving to and from a bed to a chair (including a wheelchair)? 3 - A Little - Requires a little help (supervision, minimal assistance). Can use assistive devices.   Standing up from a chair using your arms(e.g., wheelchair or  bedside chair)? 3 - A Little - Requires a little help (supervision, minimal assistance). Can use assistive devices.   To walk in a hospital room? 3 - A Little - Requires a little help (supervision, minimal assistance). Can use assistive devices. Climbing 3-5 steps with a railing? 3 - A Little - Requires a little help (supervision, minimal assistance). Can use assistive devices.   AMPAC Mobility Score 18   TARGET Highest Level of Mobility Mobility Level 6, Walk 10+steps    Therapeutic Exercise  Therapeutic Exercise Comments reviewed seated therex at EOB    Therapeutic Functional Activity  Therapeutic Functional Activity Comments bed mobility, gait, transfers    Clinical Impression  Follow up Assessment pt seen for follow up session with fair response to treatment. Pt was able to ambulate with SC with no overt LOB although reports pt may feel more comfortable with RW. Pt declined trialing stairs 2/2 ability to stay on first floor with niece at home. Education regarding pacing activity and asking for assistance provided. Will continue to follow to address acute deficits. PT rec for home with home PT and 24 hr assistance when medically ready    Patient/Family Stated Goals  Patient/Family Stated Goal(s) return home    Frequency/Equipment Recommendations  PT Frequency 3x per week   What day of week is next treatment expected? Tuesday    PT Recommendations for Inpatient Admission  Activity/Level of Assist ambulate;assist of 1;with straight cane;in room    PT Discharge Summary  Physical Therapy Disposition Recommendation Home   Additional Physical Therapy Disposition Recommendations Home Physical Therapy      Ledell Peoples, PTMH: [phone]

## 2020-05-31 ENCOUNTER — Encounter: Admit: 2020-05-31 | Payer: PRIVATE HEALTH INSURANCE

## 2020-05-31 NOTE — Discharge Summary
[facility]       Med/Surg Discharge Summary    Patient Data:    Patient Name: Paul Hunter Admit date: 05/27/2020   Age: 69 y.o. 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 Monitor for evidence of recurrent GIB  ? F/up results from EBUS / biopsy for lung pathology   ? Decision to resume Plavix   ? Altered steroid taper - to resume 130mg  weekly on 10/5 and taper by 10mg  each week per Cards  ? Discharged on reduced dose of furosemide (20mg  instead of 40mg ) - f/up whether he should increase his dosing again      Hospital Course:   Mr. 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     Please see recent discharge summaries for details of prior hospitalizations. Briefly, Mr. Schult was in his usual state of health after these admissions until the morning of admission (10/1) when he had a pre-syncopal event. He reports that he was getting out of his car to attend a Pulmonology appt when he experienced light-headedness, tunnel vision and he helped himself down to the ground. He did not lose consciousness or hit his head. He also reported several loose BMs in the week prior to admission with 2-3 watery BMs during his Pulmonolgy appt. Due to his pre-syncope, he was referred to the [facility] for further evaluation. In the ECC, his initial BP 103/64, HR 94 but otherwise HDS. Trop elevated to 0.05. BNP elevated to 1015. He was given both 1L NS and IV lasix 60mg . On arrival to the floor, orthostatic vitals were performed: lying - BP 123/68, HR 68; sitting - 100/63, HR 87; standing - BP 68/41, HR 102.     Hospital course as follows:     #Pre-syncope  Event likely orthostatic in nature given rise from sitting to standing, positive orthostatic vitals and history suggestive for hypovolemia (diarrhea, recent addition of diuretics). Resolved with IV fluid administration. He was continued on telemetry monitoring showing intermittent ventricular trigeminy and ventricular bigeminy. He was asymptomatic.      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 A cardiac MR on 8/24 with mixed ischemic and nonischemic enhancement at which point he was started on prednisone taper for suspected ICI toxicity. Underwent PET stress on 9/10 showing large reversible perfusion deficits and LVEF drop to 36-40% so started on DAPT given concern for significant CAD component as well. Unfortunately unable to tolerate DAPT d/t hematuria. He had continued on his PO steroid taper (current dose 80mg  daily on admission). D/t bump in troponin on admission (0.04-0.05), cardiology was consulted and assistance from cardiology-oncology was requested regarding need for steroid taper adjustment. Per Cards, he steroid dosing was increased to IV methylprednisolone 1g for three days with  plan to resume prednisone taper starting from 130mg  and decreasing by 10mg  weekly. His metoprolol and furosemide was held on admission iso orthostasis but resumed prior to discharge. His ASA was held with his GI bleed but resumed prior to discharge. Per Urology, patient was able to restart Plavix as long as hematuria had resolved, but he was ultimately not started on Plavix d/t GIB. A repeat TTE was low quality study but showed LVEF 66%.     #Diarrhea  Etiology viral vs infectious vs less likely ICI toxicity. Improved on admission. Stool pathogen PCR and norovirus were negative.     #GI Bleed  On 10/3, patient had 2 episodes of dark stools. He remained hemodynamically stable with no drop in H/H. Given hx of malignancy, chronic steroid use, ongoing indication for antiplatelet use, a limited GIB work-up was sent. He was started on IV PPI switched to PO PPI prior to discharge (home famotidine discontinued). H Pylori testing was negative. EGD was considered but ultimately deferred as the patient's dark BMs had resolved. ASA was held but restarted prior to discharge.     #NSCLC  #COPD  Patient has a significant smoking history (75pack year), still current smoker). He was diagnosed with presumed NSCLC in 06/2019 in [facility], started on carbo/taxol/Pembro (11/2019) before transition of care to [facility]. 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 Recent plan to transition to [facility] maintenance was stopped d/t concern for ICI toxicity. Underwent repeat EBUS w/ bx on 9/28 for re-evaluation (whether he has two separate primaries vs metastatic bladder cancer). Pathology was pending during this admission. He was continued on a Stiolto inhaler.     #Bladder cancer   Intermittent hematuria for several years, recently worsened after starting on DAPT which was c/b clotting and urinary retention. 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 Bladder scans were continued during admission to ensure he was not retaining urine. He was continued on home oxybutynin.       Cognitive Status at Discharge: Baseline       UPCOMING APPOINTMENTS:   Future Appointments       Provider Department Center    06/02/2020 8:00 AM [facility] SHORT TREATMENT [place] [facility] at [address] [facility] M    06/02/2020 9:00 AM Nikki Dom, MD [facility] Thoracic Oncology Program at [facility] at [address] [facility] [place] M    06/03/2020 11:00 AM Mulinski, Theotis Burrow., APRN [facility] Onco-Oncology Program at [facility] [place] M    06/21/2020 12:00 PM [facility] MR MRC [address] [facility] Rad    06/30/2020 4:30 PM Catalina Antigua., MD [facility] Urology at [address] [facility] CAD    08/12/2020 10:00 AM Quillian Quince, [place], MD [facility] CAD          Discharged Condition: good    Disposition: Home     Allergies   No Known Allergies     Discharge vitals:   Blood pressure (!) 163/95, pulse 90, temperature 97.7 ?F (36.5 ?C), temperature source Oral, resp. rate 18, height 6' (1.829 m), weight (!) 138.3 kg, SpO2 94 %.    Discharge Physical Exam and Pertinent Findings of Physical Exam    Physical Exam  Constitutional:       Appearance: Normal appearance. He is not ill-appearing.   HENT:      Head: Normocephalic and atraumatic.      Mouth/Throat:  Mouth: Mucous membranes are moist.      Pharynx: Oropharynx is clear.   Eyes:      Extraocular Movements: Extraocular movements intact.      Conjunctiva/sclera: Conjunctivae normal.      Pupils: Pupils are equal, round, and reactive to light.   Cardiovascular:      Rate and Rhythm: Normal rate and regular rhythm.      Heart sounds: No murmur heard.     Pulmonary:      Effort: Pulmonary effort is normal.      Breath sounds: Rales present.      Comments: Faint bibasilar inspiratory crackles   Abdominal:      General: Bowel sounds are normal.      Palpations: Abdomen is soft.      Tenderness: There is no abdominal tenderness. There is no guarding.      Comments: Moderate distension   Musculoskeletal:         General: Swelling present.      Cervical back: Normal range of motion and neck supple.      Comments: Trace LE edema bilaterally   Skin:     General: Skin is warm and dry.      Coloration: Skin is not jaundiced (  ).      Findings: No rash.   Neurological:      General: No focal deficit present.      Mental Status: He is alert and oriented to person, place, and time. Mental status is at baseline. Cranial Nerves: No cranial nerve deficit.   Psychiatric:         Mood and Affect: Mood normal.         Behavior: Behavior normal.         Pending Labs and Tests:   Pending Lab Results     Order Current Status    Miscellaneous test [facility]     Person [facility]) In process          Discharge Medications:   Current Discharge Medication List      START taking these medications    Details   pantoprazole (PROTONIX) 40 mg tablet Take 1 tablet (40 mg total) by mouth daily.  Qty: 30 tablet, Refills: 2  Start date: 05/31/2020      tamsulosin (FLOMAX) 0.4 mg 24 hr capsule Take 1 capsule (0.4 mg total) by mouth nightly.  Qty: 30 capsule, Refills: 2  Start date: 05/30/2020         CONTINUE these medications which have CHANGED    Details   furosemide (LASIX) 40 mg tablet Take 0.5 tablets (20 mg total) by mouth daily.  Qty: 30 tablet, Refills: 2  Start date: 05/30/2020      !! predniSONE (DELTASONE) 10 mg tablet Take 3 tablets (30 mg total) by mouth daily for 7 days, THEN 2 tablets (20 mg total) daily for 7 days, THEN 1 tablet (10 mg total) daily for 7 days, THEN 0.5 tablets (5 mg total) daily for 7 days, THEN 0.5 tablets (5 mg total) daily for 7 days. Take with food.Rob Bunting: 49 tablet, Refills: 0  Start date: 08/09/2020, End date: 09/13/2020      !! predniSONE (DELTASONE) 10 mg tablet Take 13 tablets (130 mg total) by mouth daily for 7 days, THEN 12 tablets (120 mg total) daily for 7 days, THEN 11 tablets (110 mg total) daily for 7 days, THEN 10 tablets (100 mg total) daily for 7 days, THEN 9 tablets (90 mg total)  daily for 7 days, THEN 8 tablets (80 mg total) daily for 7 days, THEN 7 tablets (70 mg total) daily for 7 days, THEN 6 tablets (60 mg total) daily for 7 days, THEN 5 tablets (50 mg total) daily for 7 days, THEN 4 tablets (40 mg total) daily for 7 days. Take with food.Rob Bunting: 595 tablet, Refills: 0  Start date: 05/31/2020, End date: 08/09/2020       !! - Potential duplicate medications found. Please discuss with provider.      CONTINUE these medications which have NOT CHANGED    Details   aspirin 81 MG EC tablet Take 81 mg by mouth daily.      atorvastatin (LIPITOR) 80 mg tablet Take 1 tablet (80 mg total) by mouth nightly.  Qty: 30 tablet, Refills: 6      carboxymethylcellulose (REFRESH PLUS) 0.5 % ophthalmic solution in dropperette Place 1 drop into both eyes 3 (three) times daily as needed.      escitalopram oxalate (LEXAPRO) 10 mg tablet Take 1 tablet (10 mg total) by mouth nightly.  Qty: 90 tablet, Refills: 3      fexofenadine (ALLEGRA) 180 mg tablet Take 180 mg by mouth daily as needed.       gabapentin (NEURONTIN) 300 mg capsule Take 2 capsules (600 mg total) by mouth nightly.  Qty: 60 capsule, Refills: 0    Associated Diagnoses: Lung nodule      multivitamin capsule Take 1 capsule by mouth daily.       nicotine (NICODERM CQ) 7 mg transdermal patch Place 1 patch (7 mg total) onto the skin daily. Alternate arms/sites when applying patch. Discard old patch when applying new one  Qty: 28 patch, Refills: 0    Associated Diagnoses: Cigarette nicotine dependence without complication      oxybutynin XL (DITROPAN-XL) 5 mg 24 hr tablet Take 1 tablet (5 mg total) by mouth daily.  Qty: 90 tablet, Refills: 0      polyethylene glycol (MIRALAX) 17 gram packet Take 1 packet (17 g total) by mouth daily as needed for constipation. Mix in 8 ounces of water, juice, soda, coffee or tea prior to taking.  Qty: 14 each, Refills: 0    Associated Diagnoses: Chronic kidney disease, unspecified CKD stage      tiotropium (SPIRIVA WITH HANDIHALER) 18 mcg capsule for inhalation Inhale 18 mcg into the lungs daily.      albuterol sulfate 90 mcg/actuation HFA aerosol inhaler Inhale 2 puffs into the lungs every 6 (six) hours as needed for wheezing or shortness of breath.  Qty: 6.7 g, Refills: 0      belladonna alkaloids-opium (B&O SUPPRETTES) 16.2-30 mg suppository Place 1 suppository rectally 2 (two) times daily as needed for bladder spasm.  Qty: 12 suppository, Refills: 0 Associated Diagnoses: Pulmonary emphysema, unspecified emphysema type (HC Code) (HC CODE)      lisinopriL (PRINIVIL,ZESTRIL) 2.5 mg tablet Take 2.5 mg by mouth daily.      LORazepam (ATIVAN) 0.5 mg tablet Take 1 tablet (0.5 mg total) by mouth every 6 (six) hours as needed for anxiety.  Qty: 15 tablet, Refills: 0    Associated Diagnoses: Chronic kidney disease, unspecified CKD stage      metoprolol succinate XL (TOPROL-XL) 25 mg 24 hr tablet Take 0.5 tablets (12.5 mg total) by mouth daily. Take with or immediately following a meal.  Qty: 30 tablet, Refills: 0      oxyCODONE (ROXICODONE) 5 mg Immediate Release tablet Take 1 tablet (5  mg total) by mouth every 4 (four) hours as needed for up to 5 doses.  Qty: 5 tablet, Refills: 0      senna-docusate (SENNA-PLUS) 8.6-50 mg tablet Take 1 tablet by mouth nightly.  Qty: 30 tablet, Refills: 0    Associated Diagnoses: Constipation, unspecified constipation type      simethicone (MYLICON) 80 mg chewable tablet Take 1 tablet (80 mg total) by mouth every 6 (six) hours as needed for flatulence.  Qty: 30 tablet, Refills: 0    Associated Diagnoses: Chronic kidney disease, unspecified CKD stage      sulfamethoxazole-trimethoprim (BACTRIM DS;CO-TRIMOXAZOLE DS) 800-160 mg per tablet Take 1 tablet by mouth Every Monday, Wednesday, and Friday.  Qty: 28 tablet, Refills: 0  Start date: 05/27/2020    Associated Diagnoses: Chronic kidney disease, unspecified CKD stage         STOP taking these medications       famotidine (PEPCID) 20 mg tablet              .    Hemaglobin A1c     Hemaglobin A1C Latest Ref Rng & Units 04/08/2020    BKR HEMOGLOBIN A1C 4.0 - 5.6 % 5.6          Pertinent lab findings and test results: Negative H pylori. Negative stool pathogen and norovirus PCR. TTE with LVEF 66% although poor quality study.   Pertinent Procedures None.      PMH PSH   Past Medical History:   Diagnosis Date   ? Anxiety    ? CAD (coronary artery disease)    ? Diabetes mellitus (HC Code) (HC CODE) ? Difficult intubation 05/22/2020    Patient with significant soft tissue in oropharynx. Glidescope intubation revealed anterior cords with floppy soft tissue obstruction. Patient will require video laryngoscopy for airway management going forward.   ? GERD (gastroesophageal reflux disease)    ? Hyperlipidemia    ? Hypertension    ? Hypertrophy of prostate without urinary obstruction and other lower urinary tract symptoms (LUTS)    ? Other testicular hypofunction    ? Spinal stenosis 09/28/2018   ? Tobacco abuse    ? Vitamin D deficiency       Past Surgical History:   Procedure Laterality Date   ? CAROTID STENT  08/28/2003   ? CHOLECYSTECTOMY  11/07/2016   ? KNEE SURGERY      torn meniscus   ? LAMINECTOMY  10/07/2008   ? LUMBAR DISC SURGERY      herniated disc        Social History Family History   Social History     Tobacco Use   ? Smoking status: Current Every Day Smoker     Types: Cigarettes   Substance Use Topics   ? Alcohol use: Not on file      No family history on file.       Dwyane Luo  05/30/2020 10:36 AM    Electronically Signed:  Dwyane Luo, MD   05/30/2020   1:20 PM   Best Contact Information: [employer]    Addendum :     NONE

## 2020-05-31 NOTE — Other
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 Hospital course c/b melanotic stools x2, now being worked up for GIB.  Palliative care was consulted to assist with GOC and support.    Met with patient at bedside this afternoon.  He is alert, pleasant, and engaged in conversation.  Introduced role of palliative care.  He was thankful for additional support.  He offers that he was living in [place] when he was diagnosed with cancer but moved back to [place] to be with family and to get treated at [facility].  He lives with his niece and his sister in law helps with all of his medical care and appointments.  He has two children son and daughter, and 2 grandchildren.  He also has four siblings who are supportive.  He offers that he has a strong support system in his family. All of my physical needs are being met and my family helps with my emotional needs as well. He is intermittently tearful discussing his diagnosis, treatment, and coping with the illness.      He has a good understanding of his illness.  He verbalized that he has Stage 4 cancer. At present he was recently told that the cancer is now in his bladder.  He is unsure whether that is directly related to his lung cancer or a new cancer.  He has a follow-up appointment with Dr. Layla Barter this Thursday.  He remains hopeful to continue to receive life prolonging treatment.  He wants to spend as much time with his grandchildren as possible.  I am not giving up. He does not want to draw things out either.   He is also preparing should his disease decline.  He has completed an HCR and living will.  His sister in law [name] is his HCR. He is working with a Clinical research associate to get his finances in order.  He is planning his own funeral with the [place].  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  He enjoys spending time with his family, eating out, listening to music.  He is Catholic and feels he has made peace with God.  He is planning to speak with a priest in the future.      He has ongoing chronic pain from scoliosis in his back.  Before he was receiving cnacer directed therapy he followed with a pain specialist and would get injections.  This pain causes pain down his hips and legs, described as burning.  He is comfortable at rest, but flares up with climbing stairs and other movements.  He is currently on gabapentin and he shares they just increased his dose.  He reports chronic hip/groin/knee pain as well.  He denies any N/V.  Has good appetite.  He has poor sleep, uses a CPAP at home.  He also found his sleep worsened with addition of prednisone.  He did find that the prednisone helped to decrease his pain overall.  He is having regular BMs.      He is  amenable to follow-up with palliative care as an outpatient.  Sent referral for outpatient appointment.     Palliative Care Review of Systems  Pain: chronic back and lower extremity pain   Dyspnea:no  Cough: no  Nausea/Vomiting: no  Change in appetite: no  Constipation: no  Fatigue: yes  Insomnia:yes  Drowsiness : no  Anxiety/Depression: anxiety related to the unknown of his illness  Concerns/Worries:  Next steps, prognosis, what the new cancer in the bladder means  ?  General Review of Systems  Review of Systems   Constitutional:        Gentleman resting in bed, NAD   HENT: Negative.    Eyes: Negative.    Respiratory: Negative.    Cardiovascular: Negative.    Gastrointestinal: Negative.    Genitourinary: Negative.    Musculoskeletal: Positive for back pain.   Skin: Negative.    Neurological: Negative.    Endo/Heme/Allergies: Negative.    Psychiatric/Behavioral:        Anxiety related to unknown of illness   ?  Review of Allergies/Hx/Meds   Allergies  Patient has No Known Allergies.  ?  Medical History  Past Medical History        Past Medical History:   Diagnosis Date   ? Anxiety ?   ? CAD (coronary artery disease) ?   ? Diabetes mellitus (HC Code) (HC CODE) ?   ? Difficult intubation 05/22/2020   ? Patient with significant soft tissue in oropharynx. Glidescope intubation revealed anterior cords with floppy soft tissue obstruction. Patient will require video laryngoscopy for airway management going forward.   ? GERD (gastroesophageal reflux disease) ?   ? Hyperlipidemia ?   ? Hypertension ?   ? Hypertrophy of prostate without urinary obstruction and other lower urinary tract symptoms (LUTS) ?   ? Other testicular hypofunction ?   ? Spinal stenosis 09/28/2018   ? Tobacco abuse ?   ? Vitamin D deficiency ?      ?  ?  Family History  History reviewed. No pertinent family history.  ?  Social History  Social History      ?        Socioeconomic History   ? Marital status: Divorced   ? ? Spouse name: Not on file   ? Number of children: Not on file   ? Years of education: Not on file   ? Highest education level: Not on file   Occupational History   ? Not on file   Tobacco Use   ? Smoking status: Current Every Day Smoker   ? ? Types: Cigarettes   Substance and Sexual Activity   ? Alcohol use: Not on file   ? Drug use: Not on file   ? Sexual activity: Not on file   Other Topics Concern   ? Not on file   Social History Narrative   ? Not on file   ?  Social Determinants of Health   ?      Financial Resource Strain:    ? Difficulty of Paying Living Expenses:    Food Insecurity:    ? Worried About Programme researcher, broadcasting/film/video in the Last Year:    ? Barista in the Last Year:    Transportation Needs:    ? Freight forwarder (Medical):    ? Lack of Transportation (Non-Medical):    Physical Activity:    ? Days of Exercise per Week:    ? Minutes of Exercise  per Session:    Stress:    ? Feeling of Stress :    Social Connections:    ? Frequency of Communication with Friends and Family:    ? Frequency of Social Gatherings with Friends and Family:    ? Attends Religious Services:    ? Active Member of Clubs or Organizations:    ? Attends Banker Meetings:    ? Marital Status:    Intimate Partner Violence:    ? Fear of Current or Ex-Partner:    ? Emotionally Abused:    ? Physically Abused:    ? Sexually Abused:       ?  ?  InPatient Medications          Facility-Administered Medications Ordered in Other Encounters   Medication Dose Route Frequency Last Rate   ? acetaminophen  650 mg Oral Q6H PRN     ? albuterol sulfate  2 puff Inhalation Q6H PRN     ? aspirin  81 mg Oral Daily     ? carboxymethylcellulose  2 drop Both Eyes TID PRN     ? [Held by provider]  enoxaparin prophylaxis dosing  40 mg Subcutaneous Q12H     ? escitalopram oxalate  10 mg Oral Nightly     ? furosemide  20 mg Oral Daily     ? gabapentin  600 mg Oral Nightly     ? heparin PF in 0.9 % sodium chloride  5 mL Intra-Catheter PRN for Line Care     ? LORazepam  0.5 mg Oral Q8H PRN     ? melatonin  6 mg Oral Nightly PRN     ? metoprolol succinate XL  12.5 mg Oral Daily     ? multivitamin with folic acid  1 tablet Oral Daily     ? nicotine  7 mg Transdermal Daily     ? nicotine (polacrilex)  2 mg Oral Q2H PRN     ? oxybutynin XL  5 mg Oral Daily     ? oxyCODONE  5 mg Oral Q6H PRN     ? pantoprazole  40 mg Oral Daily     ? rosuvastatin  40 mg Oral Daily     ? senna  2 tablet Oral BID PRN     ? simethicone  80 mg Oral Q6H PRN     ? sodium chloride  10 mL Intra-Catheter PRN for Line Care     ? sodium chloride  20 mL Intra-Catheter PRN for Line Care     ? sodium chloride  3 mL IV Push Q8H     ? sodium chloride  3 mL IV Push PRN for Line Care     ? sulfamethoxazole-trimethoprim  1 tablet Oral Once per day on Mon Wed Fri     ? tamsulosin 0.4 mg Oral Nightly     ? tiotropium-olodateroL  2 Inhalation Inhalation Daily     ?    [email or web address]  ?  Palliative Performance Scale 70  (Modification of Karnofsky and was designed for measurement of physical status in Palliative Care. Only 10% of patients with PPS score <=50% would be expected to survive >6 months)  ?  ?  Physical Exam  Constitutional:       Appearance: Normal appearance. He is obese.   HENT:      Head: Normocephalic.      Nose: Nose normal.      Mouth/Throat:      Mouth: Mucous  membranes are moist.   Eyes:      Conjunctiva/sclera: Conjunctivae normal.   Pulmonary:      Effort: Pulmonary effort is normal.   Musculoskeletal:         General: Normal range of motion.      Cervical back: Normal range of motion.   Skin:     General: Skin is warm and dry.   Neurological:      General: No focal deficit present.      Mental Status: He is alert and oriented to person, place, and time.   Psychiatric:         Mood and Affect: Mood normal.         Behavior: Behavior normal.         Thought Content: Thought content normal.         Judgment: Judgment normal.   ?     Vitals:  Temp:  [97 ?F (36.1 ?C)-97.9 ?F (36.6 ?C)] 97.7 ?F (36.5 ?C)  Pulse:  [72-90] 75  Resp:  [18-20] 18  BP: (145-163)/(58-95) 157/76  SpO2:  [94 %-97 %] 94 %      Wt Readings from Last 3 Encounters:   05/27/20 (!) 138.3 kg   05/21/20 (!) 139 kg   05/20/20 (!) 140.9 kg   ?  Review of Labs/Diagnostics  Complete Blood Count:  Recent Labs   Lab 05/28/20  0513 05/29/20  0527 05/30/20  0521   WBC 14.3* 14.8* 21.0*   HGB 10.3* 10.1* 9.9*   HCT 31.2* 30.8* 29.4*   PLT 228 248 264   ?  Comprehensive Metabolic Panel:  Recent Labs   Lab 05/28/20  0513 05/29/20  0527 05/30/20  0521   NA 142 144 146*   K 3.8 3.8 3.8   CL 104 107 108*   CO2 28 25 26    BUN 27* 32* 31*   CREATININE 1.61* 1.41* 1.42*   GLU 101* 226* 168*   CALCIUM 8.4* 8.8 8.5*   ?         Recent Labs   Lab 05/27/20  1143 05/28/20  0513 05/29/20  0527 05/30/20  0521   ALKPHOS 76 70 89 96   BILITOT 0.2 0.4 0.2 0.2   BILIDIR  --  <0.2  --   --    PROT 5.6*  --  6.2* 5.9*   ALT 59 45 45 45   AST 21 13 15 26    ?  ?  Diagnostics:  XR Chest PA or AP  ?  Result Date: 05/27/2020  Multiple bilateral variable sized pulmonary nodules. No acute radiographic findings. Reported And Signed By: Maudie Flakes, MD  [facility] Radiology and Biomedical Imaging  ?  [place] Abdomen Pelvis w IV Contrast  ?  Result Date: 05/28/2020  1.  Bladder wall thickening at the base with nonvisualization of the previously seen bladder tumor consistent with interval resection. 2.  Slightly decreased soft tissue at the ureteropelvic junction likely reflecting interval biopsy/resection of the known tumor. Unchanged mild left hydronephrosis with left renal atrophy. 3.  No change in retroperitoneal lymph nodes. 4.  Redemonstration of metastatic lung disease. Reported And Signed By: Levonne Spiller, MD  [facility] Radiology and Biomedical Imaging  ?  ?  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  Palliative care was consulted to assist with GOC and support.   ?  # Generalized pain in the setting of chronic pain  - Recommend Tylenol 650mg  PO q6h PRN for generalized pain   - Currently on gabapentin 600mg  PO HS  - can consider lidocaine patch TD 12 hours on, 12 hours off   - Warm/ cool compresses as tolerated   ?  # Fatigue in the setting of advanced cancer with decline in functional status  - Non pharmacologic sleep hygiene techniques including:  keep lights on during the day and off at night, minimize overnight interruptions (lab draws, vital signs, medication administration)  - PT/ exercise as tolerated  - Energy conservation techniques during the day with periods of rest as needed   ?  # Insomnia in the setting of hospitalization, symptom burden, altered sleep/wake cycle, environmental interruptions   - Non- pharmacologic interventions: minimize daytime naps, promote good sleep hygiene, minimize nighttime interruptions, relaxation techniques   ?  # Difficulty coping in the setting of advanced disease  - Symptom management as above   - Will refer to palliative care interdisciplinary team for ongoing support  - Reached out to palliative care to set up outpatient follow-up  ?  # Palliative care encounter in the setting of advanced cancer, decline in functional status, ongoing symptom burden   - Full code -->not discussed on this encounter  - HCP and living will are complete.  His sister in law [name] is HCP and has copy of original  - Palliative care will continue to follow for ongoing symptom management and support.  We remain available to assist with future GOC meetings as patient's condition evolves.     - Sent staff message to outpatient palliative care team to arrange outpatient follow-up.   ?  Patient was evaluated: In person   ?  Time committed:??55??minutes>50% of time was spent in patient care/family counseling dealing with complex physical and emotional issues of symptm management and palliative care in the setting of serious and potentially lifethreatening illness.  ?  ?  Signed:  Josephine Igo, FNP-BC  Pallaitive Care   Can be reached via [facility]  05/30/2020  1:20 PM  ?

## 2020-06-01 NOTE — Progress Notes
CARDIOVASCULAR MEDICINE FOLLOW-UP NOTEReason for consult: pre-procedural risk stratification (GI endoscopy)HPI: Paul Hunter is a 69 y.o. 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 who initially presented with orthostatic hypotension, and was started on high-dose steroids given concerns for immune checkpoint inhibitor myocarditis (empirically).Cardiology has been following, but is now consulted again to comment on pre-operative risk stratification for possible GI endoscopy tomorrow in the setting of recurrent hematuria and new melena.Pertinent meds: methylpred 1 g (day 2/3), asa 81, lasix 20 qd, toprol 12.5, rosuvastatin 40, Vital signs: hypertensive to 140's-160's systolics, rates in the 50's-70's, SpO2 97% on room airPhysical exam (8 pm): resting comfortably in bed, NAD, speaking in full sentences, no JVD, lungs without crackles but scattered end-expiratory wheezing, cardiac auscultation with rrr no murmurs/rubs, no LE edemaLabs:- Creatinine 1.61 --> 1.41- TnT 0.06 --> 0.04- Hgb 10.6 --> 10.1- PLT 248Echo: repeat TTE 10/2 with LVEF 66%, no effusionEKG: SR, ventricular trigeminy, old pattern of anterolateral T wave flatteningImpression:# CAD s/p PCI x 4 (last in 2014)# PET stress 04/2020 with high-risk features# HF w/ recovered EF# Hematuria and melena - off plavix, continues on aspirin monotherapyHis RCRI is 2 - corresponding to an average 10.1% risk of major adverse cardiac events within 30 days after a procedure, though this likely underestimates his risk given his recent history of decompensated heart failure and high-risk ischemic features on his most recent stress test. Further complicating things, he is currently being treated for ICI myocarditis, though his most recent echo shows recovery of his EF and his troponin T's are overall stable. Reassuringly, he does not appear to be in decompensated heart failure at this time, and denies any ischemic symptoms suggestive of ACS. No signs of valvular pathology on his most recent complete echo dated 04/2020. Given his inability to tolerate DAPT, repeating a stress test would be of no value (other than re-stratifying his risk, which is known to be high).In summary, he is a high risk candidate for a low risk procedure, though his procedure is likely higher risk if a biopsy or further hemostatic interventions are attempted.Recommendations:- if primary team and GI team opt to proceed with endoscopic intervention, no further stress testing would be recommended at this time, acknowledging the recent high-risk findings that are unlikely to have changed and the inability to tolerate DAPT- continue aspirin 81 mg po qd- clopidogrel discontinued per Dr Jennette Kettle- continue furosemide 20 mg po qd & metoprolol 12.5 mg po qd- complete course of steroids; cardio-oncology to follow during the weekSigned ZO:[id number] Paul Hast, MD PhDFellow in the Section of Cardiovascular DiseaseAfter 630 PM, please page the appropriate on call pager/covering provider for your site. Attending attestation to follow.

## 2020-06-01 NOTE — ED Provider Notes
Emergency Medicine PGY-1 Resident NoteSynopsis: 69 y.o. male with a PMH of CAD X 4 stent on dual therapy, DM, HLD, HTN, Stage 4 lung cancer on chemotherapy who presents today complaining of hematuria. Hematuria for the past 3 year on and off but recently worsened with clots. He presents with a urinary bag with dark red colored urine. Patient was seen in the urgent care on Saturday and a urinary catheter was placed for urinary retention. He was also seen in this ED 2 days ago for the same complaint , he endorses some suprapubic pain 5/10. Cystoscopy scheduled for today.  Endorses chronic SOB attributed to his lung cancer and denies chest pain. No nausea, vomiting, diarrhea or constipation. Vitals: BP (!) 183/79  - Pulse 84  - Temp 97.4 ?F (36.3 ?C)  - Resp 18  - SpO2 96% Pertinent Physical Exam:Suprapubic pain and questionable CVA tenderness- remaining review of systems and physical exam as detailed belowDdx:Given patient clinical presentation ddx includes but is not limited to nephrolithiasis, uti,cystitis, BPH, hydronephrosis.  Plan/ED Course:Patient is hypertensive, afebrile, heart rate is 84, does not appear to be in respiratory distress, is alert and oriented, No gross neurological deficitsLabs: UA, Chem, CBCImaging: Renal USMeds:  Morphine1:16 PMUrology on board and recs d/c patient so he ca make it to his 2 pm appointment with his urologist for his cystoscopy today.  Unable to obtain urine at this time. No fevers or chills, hemodynamically stable. UA pendingDispo: pending urologyThis patient was presented to and discussed with Dr. Leanor Kail and a treatment plan and disposition were collaboratively agreed upon.Martyn Ehrich, MD Emergency Medicine PGY-1Reachable on Mobile HeartbeatHistoryChief Complaint Patient presents with ? Urinary Catheter Problem   Urinary catheter clogged with hematuria/clots. Hematuria started 2x weeks ago, clots started yeterday. States last urinary output 4am. 2 episodes of clots overnight. +blood thinners.  ? Hematuria ? Shortness of Breath  The history is provided by the patient. IllnessThis is a recurrent problem. The current episode started more than 1 week ago. The problem occurs constantly. The problem has been gradually worsening. Associated symptoms include shortness of breath. Pertinent negatives include no chest pain, no abdominal pain and no headaches. Nothing aggravates the symptoms. Nothing relieves the symptoms. He has tried nothing for the symptoms.  Past Medical History: Diagnosis Date ? Anxiety  ? CAD (coronary artery disease)  ? Diabetes mellitus (HC Code) (HC CODE)  ? Difficult intubation 05/22/2020  Patient with significant soft tissue in oropharynx. Glidescope intubation revealed anterior cords with floppy soft tissue obstruction. Patient will require video laryngoscopy for airway management going forward. ? GERD (gastroesophageal reflux disease)  ? Hyperlipidemia  ? Hypertension  ? Hypertrophy of prostate without urinary obstruction and other lower urinary tract symptoms (LUTS)  ? Other testicular hypofunction  ? Spinal stenosis 09/28/2018 ? Tobacco abuse  ? Vitamin D deficiency  Past Surgical History: Procedure Laterality Date ? CAROTID STENT  08/28/2003 ? CHOLECYSTECTOMY  11/07/2016 ? KNEE SURGERY    torn meniscus ? LAMINECTOMY  10/07/2008 ? LUMBAR DISC SURGERY    herniated disc No family history on file.Social History Socioeconomic History ? Marital status: Divorced   Spouse name: Not on file ? Number of children: Not on file ? Years of education: Not on file ? Highest education level: Not on file Tobacco Use ? Smoking status: Current Every Day Smoker   Types: Cigarettes ED Other Social History E-cigarette/Vaping Substances E-cigarette/Vaping Devices Review of Systems Constitutional: Negative for chills and fever. HENT: Negative for sore throat.  Respiratory: Positive for  shortness of breath. Negative for cough and chest tightness.  Cardiovascular: Negative for chest pain. Gastrointestinal: Negative for abdominal pain, constipation, diarrhea, nausea and vomiting. Genitourinary: Positive for difficulty urinating and hematuria. Negative for dysuria, frequency, penile swelling, testicular pain and urgency. Musculoskeletal: Negative for myalgias. Neurological: Negative for weakness, numbness and headaches. All other systems reviewed and are negative. Physical ExamED Triage Vitals [05/19/20 1017]BP: (!) 183/79Pulse: 84Pulse from  O2 sat: n/aResp: 18Temp: 97.4 ?F (36.3 ?C)Temp src: n/aSpO2: 96 % BP (!) 183/79  - Pulse 84  - Temp 97.4 ?F (36.3 ?C)  - Resp 18  - SpO2 96% Physical ExamVitals reviewed. Constitutional:     Appearance: Normal appearance. HENT:    Right Ear: External ear normal.    Left Ear: External ear normal.    Nose: Nose normal. Eyes:    Conjunctiva/sclera: Conjunctivae normal. Cardiovascular:    Rate and Rhythm: Normal rate and regular rhythm.    Heart sounds: Normal heart sounds. Pulmonary:    Effort: Pulmonary effort is normal.    Breath sounds: Normal breath sounds. No wheezing, rhonchi or rales. Abdominal:    General: Bowel sounds are normal. There is no distension.    Palpations: Abdomen is soft.    Tenderness: There is abdominal tenderness. There is right CVA tenderness and left CVA tenderness. There is no guarding or rebound.    Hernia: No hernia is present. Genitourinary:   Comments: Dark colored urine + clots Musculoskeletal:       General: Normal range of motion. Skin:   General: Skin is warm and dry. Neurological:    General: No focal deficit present.    Mental Status: He is alert and oriented to person, place, and time.  ProceduresProcedures ED COURSEReviewed previous: previous chart and labsInterpreted by ED Provider: labs and pulse oximetryDiscuss the patient with other providers: yes (urology)Patient Reevaluation: Attending Supervised: ResidentI saw and examined the patient. I agree with the findings and plan of care as documented in the resident's note. Of note  68yo h/o cad w stents (Plavix, ASA), htn, dm, hl, stage IV lung CA (on chemo), c/o hematuria.  Seen at UC 5 d ago, foley placed for retention. Last night, clots and need to force urine out. Seen in ED, had [place] showing worsening L hydronephrosis, ?infection, worsening metastases. Urology eval at that time and d/c for plan cystoscopy today at 1400. No abx. C/o ongoing discomfort suprapubic. No f/c, no n/v, nl bm. Afeb, 183/79, nontoxic, mucosa moist, abd obese, soft, mld suprapubic tend, foley in place, draining dark red urine w few small clots. MDM: hematuria. PVR=minimal w some debris. B/creat 34/1.5, wbc 14.1, H/H 11.8/34.9. UA_____Urology consult, plan for d/c to Urology clinic for cystoscopy as planned. He can have void trial there if appropriate per urologist. Please follow up on UA results. rted precautions. Mayjor Ager RambusClinical Impressions as of May 31 2210 Hematuria, unspecified type  ED DispositionDischarge Roderick Pee, MDResident09/23/21 1321 Mervyn Gay, MD09/23/21 1339 Mervyn Gay, MD10/05/21 2211

## 2020-06-01 NOTE — Progress Notes
Transitional Care Manager noteTCM attempted to contact pt today with no answer, TCM did reach out to Verdis Frederickson pts sister, She reports she has spoken with him and he was doing OK, She reports he usually takes a nap in the afternoon and wears c-pap so most likely did not hear the phone. He resides with her daughter and she keeps a close eye on him, to her knowledge he has his medication, [name] reports a good time to contact him would be mid morning. 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 ?Please see recent discharge summaries for details of prior hospitalizations. Briefly, Mr. Rouch was in his usual state of health after these admissions until the morning of admission (10/1) when he had a pre-syncopal event. He reports that he was getting out of his car to attend a Pulmonology appt when he experienced light-headedness, tunnel vision and he helped himself down to the ground. He did not lose consciousness or hit his head. He also reported several loose BMs in the week prior to admission with 2-3 watery BMs during his Pulmonolgy appt. Due to his pre-syncope, he was referred to the [facility] for further evaluation. In the ECC, his initial BP 103/64, HR 94 but otherwise HDS. Trop elevated to 0.05. BNP elevated to 1015. He was given both 1L NS and IV lasix 60mg . On arrival to the floor, orthostatic vitals were performed: lying - BP 123/68, HR 68; sitting - 100/63, HR 87; standing - BP 68/41, HR 102. ?Hospital course as follows: ?#Pre-syncopeEvent likely orthostatic in nature given rise from sitting to standing, positive orthostatic vitals and history suggestive for hypovolemia (diarrhea, recent addition of diuretics). Resolved with IV fluid administration. He was continued on telemetry monitoring showing intermittent ventricular trigeminy and ventricular bigeminy. He was asymptomatic.  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 A cardiac MR on 8/24 with mixed ischemic and nonischemic enhancement at which point he was started on prednisone taper for suspected ICI toxicity. Underwent PET stress on 9/10 showing large reversible perfusion deficits and LVEF drop to 36-40% so started on DAPT given concern for significant CAD component as well. Unfortunately unable to tolerate DAPT d/t hematuria. He had continued on his PO steroid taper (current dose 80mg  daily on admission). D/t bump in troponin on admission (0.04-0.05), cardiology was consulted and assistance from cardiology-oncology was requested regarding need for steroid taper adjustment. Per Cards, he steroid dosing was increased to IV methylprednisolone 1g for three days with plan to resume prednisone taper starting from 130mg  and decreasing by 10mg  weekly. His metoprolol and furosemide was held on admission iso orthostasis but resumed prior to discharge. His ASA was held with his GI bleed but resumed prior to discharge. Per Urology, patient was able to restart Plavix as long as hematuria had resolved, but he was ultimately not started on Plavix d/t GIB. A repeat TTE was low quality study but showed LVEF 66%. ?#DiarrheaEtiology viral vs infectious vs less likely ICI toxicity. Improved on admission. Stool pathogen  PCR and norovirus were negative. ?#GI BleedOn 10/3, patient had 2 episodes of dark stools. He remained hemodynamically stable with no drop in H/H. Given hx of malignancy, chronic steroid use, ongoing indication for antiplatelet use, a limited GIB work-up was sent. He was started on IV PPI switched to PO PPI prior to discharge (home famotidine discontinued). H Pylori testing was negative. EGD was considered but ultimately deferred as the patient's dark BMs had resolved. ASA was held but restarted prior to discharge. ?#NSCLC#COPDPatient has a significant smoking history (75pack year), still current smoker). He was diagnosed with presumed NSCLC in 06/2019 in [facility], started on carbo/taxol/Pembro (11/2019) before transition of care to [facility]. 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 Recent plan to transition to [facility] maintenance was stopped d/t concern for ICI toxicity. Underwent repeat EBUS w/ bx on 9/28 for re-evaluation (whether he has two separate primaries vs metastatic bladder cancer). Pathology was pending during this admission. He was continued on a Stiolto inhaler. ?#Bladder cancer Intermittent hematuria for several years, recently worsened after starting on DAPT which was c/b clotting and urinary retention. 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 Bladder scans were continued during admission to ensure he was not retaining urine. He was continued on home oxybutynin. ?[facility]: [facility]: (or reason for hospitalization): Chest pain, rule out acute myocardial infarction?Other Diagnosis: cardiomyopathy (ICI toxicity vs CAD), pre-syncope, GI bleed Hospital Admission Date: 09/13 to 09/16 with CHF,  ED visit on 09/23 with hematuria, readmit 09/23 to 09/24 with urinary catheter complications,        readmitted on 05/27/20 to 05/30/20 with chest pain Hospital Discharge Date: 10/04/21Disposition: homeAdditional Services on D/C?   NO          Name of AgencyDME:  YES / NO / NA       Name of Agency DME Delivered: YES / NO  Have you experienced any new symptoms or have your previous symptoms worsened?    NOIs the patient able to identify symptoms to report to MD? Yes  Did you receive new prescriptions & have you picked them up and started?   he is going with his niece this evening to [facility] to pick up new medications Flomax and ProtonixMedications reviewed and changes noted. Yes TCM discussed furosemide was restarted at a lower dose on discharge . He is to take Lasiix 20mg  instead of 40mg . He reports an understandingPrednisone taper dosing also discussed and he reports an understandingDELTASONE) 10 mg tablet Take 13 tablets (130 mg total) by mouth daily for 7 days, THEN 12 tablets (120 mg total) daily for 7 days, THEN 11 tablets (110 mg total) daily for 7 days, THEN 10 tablets (100 mg total) daily for 7 days, THEN 9 tablets (90 mg total) daily for 7 days, THEN 8 tablets (80 mg total) daily for 7 days, THEN 7 tablets (70 mg total) daily for 7 days, THEN 6 tablets (60 mg total) daily for 7 days, THEN 5 tablets (50 mg total) daily for 7 days, THEN 4 tablets (40 mg total) daily for 7 days. Take with food.Rob Bunting: 595 tablet, Refills: 0Start date: 05/31/2020, End date: 08/09/2020 Do you have any questions regarding your medications? NO Are you a smoker?    YES  If yes Recommend  Smoking Cessation  Do you have lab work to be drawn?   NO   When will you be getting lab work done?   Scheduled PCP visit on:Other scheduled MD Appointment:06/02/2020  8:00 AM [facility] SHORT TREAT[facility] at [address] [facility] [place] M  ? 06/02/2020 9:00 AM Nikki Dom, MD [facility] Thoracic Oncology Program at [facility] at [address] [facility] [place] M ? 06/03/2020 11:00 AM Mulinski, Theotis Burrow., APRN [facility] Onco-Oncology [facility] at [facility] [place] M ? 06/21/2020 12:00 PM [facility] MR MRC [address] [facility] Rad ? 06/30/2020 4:30 PM Catalina Antigua., MD [facility] Urology at [address] [facility] CAD ? 08/12/2020 10:00 AM Quillian Quince, [place], MD [facility] CAD Do you have transportation to appointment?     YES /  pts niece and sister Any other Barriers identified: No Any Questions: No Signs and symptoms to report to MD reviewed as well as when to seek out immediate medical attention and call 911 TCM Action: TCM called and spoke with pt. He reports he is feeling better. He does feel tired, denies any SOB, No complaints of lightheadedness dizziness palpitations or chest pain, no complaints of leg pain. He does report he has some swelling in his ankles which is much improved from admission, Appetite improving.We reviewed medications. He reports an understanding. He is picking up Flomax and Protonix from pharmacy this evening with his niece, He denies any difficulty with voiding or pain. He does report feeling spasms in suprapubic area if he holds urine too long. No blood in urine. He also reports feeling jittery from the prednisone,  Pt became very emotional when expressing his feelings on how well he was taken care of while hospitalized, He is very thankful to everyone, We reviewed his follow up appointment dates, TCM advised him to contact MD if he develops SOB, swelling, blood in urine, lightheadedness, palpitations, leg painPt agrees to another TCM call:     YES  TCM name and number provided and advised to call with any concernsTCM end date 06/11/20

## 2020-06-02 ENCOUNTER — Ambulatory Visit: Admit: 2020-06-02 | Payer: PRIVATE HEALTH INSURANCE | Attending: Acute Care

## 2020-06-02 ENCOUNTER — Encounter: Admit: 2020-06-02 | Payer: PRIVATE HEALTH INSURANCE

## 2020-06-02 ENCOUNTER — Telehealth: Admit: 2020-06-02 | Payer: PRIVATE HEALTH INSURANCE | Attending: Medical Oncology

## 2020-06-02 ENCOUNTER — Encounter: Admit: 2020-06-02 | Payer: PRIVATE HEALTH INSURANCE | Attending: Hematology

## 2020-06-02 ENCOUNTER — Inpatient Hospital Stay: Admit: 2020-06-02 | Discharge: 2020-06-02 | Payer: PRIVATE HEALTH INSURANCE

## 2020-06-02 ENCOUNTER — Ambulatory Visit: Admit: 2020-06-02 | Payer: PRIVATE HEALTH INSURANCE | Attending: Hematology

## 2020-06-02 DIAGNOSIS — Z5181 Encounter for therapeutic drug level monitoring: Secondary | ICD-10-CM

## 2020-06-02 DIAGNOSIS — Z720 Tobacco use: Secondary | ICD-10-CM

## 2020-06-02 DIAGNOSIS — Z79899 Other long term (current) drug therapy: Secondary | ICD-10-CM

## 2020-06-02 DIAGNOSIS — I10 Essential (primary) hypertension: Secondary | ICD-10-CM

## 2020-06-02 DIAGNOSIS — I2510 Atherosclerotic heart disease of native coronary artery without angina pectoris: Secondary | ICD-10-CM

## 2020-06-02 DIAGNOSIS — E559 Vitamin D deficiency, unspecified: Secondary | ICD-10-CM

## 2020-06-02 DIAGNOSIS — Z9189 Other specified personal risk factors, not elsewhere classified: Secondary | ICD-10-CM

## 2020-06-02 DIAGNOSIS — F419 Anxiety disorder, unspecified: Secondary | ICD-10-CM

## 2020-06-02 DIAGNOSIS — K219 Gastro-esophageal reflux disease without esophagitis: Secondary | ICD-10-CM

## 2020-06-02 DIAGNOSIS — C7800 Secondary malignant neoplasm of unspecified lung: Secondary | ICD-10-CM

## 2020-06-02 DIAGNOSIS — F17210 Nicotine dependence, cigarettes, uncomplicated: Secondary | ICD-10-CM

## 2020-06-02 DIAGNOSIS — E119 Type 2 diabetes mellitus without complications: Secondary | ICD-10-CM

## 2020-06-02 DIAGNOSIS — E291 Testicular hypofunction: Secondary | ICD-10-CM

## 2020-06-02 DIAGNOSIS — M4800 Spinal stenosis, site unspecified: Secondary | ICD-10-CM

## 2020-06-02 DIAGNOSIS — C679 Malignant neoplasm of bladder, unspecified: Secondary | ICD-10-CM

## 2020-06-02 DIAGNOSIS — C3490 Malignant neoplasm of unspecified part of unspecified bronchus or lung: Secondary | ICD-10-CM

## 2020-06-02 DIAGNOSIS — N400 Enlarged prostate without lower urinary tract symptoms: Secondary | ICD-10-CM

## 2020-06-02 DIAGNOSIS — T884XXA Failed or difficult intubation, initial encounter: Secondary | ICD-10-CM

## 2020-06-02 DIAGNOSIS — E785 Hyperlipidemia, unspecified: Secondary | ICD-10-CM

## 2020-06-02 LAB — CBC WITH AUTO DIFFERENTIAL
BKR WAM ABSOLUTE IMMATURE GRANULOCYTES: 0.6 x 1000/??L — ABNORMAL HIGH (ref 0.0–0.3)
BKR WAM ABSOLUTE LYMPHOCYTE COUNT: 1.9 x 1000/??L — ABNORMAL LOW (ref 1.0–4.0)
BKR WAM ABSOLUTE NRBC: 0 x 1000/??L (ref 0.0–0.0)
BKR WAM ANALYZER ANC: 21.8 x 1000/ÂµL — ABNORMAL HIGH (ref 1.0–11.0)
BKR WAM BASOPHILS: 0.2 % (ref 0.0–4.0)
BKR WAM EOSINOPHIL ABSOLUTE COUNT: 0 x 1000/ÂµL (ref 0.0–1.0)
BKR WAM EOSINOPHILS: 0 % (ref 0.0–7.0)
BKR WAM HEMATOCRIT: 31.5 % — ABNORMAL LOW (ref 37.0–52.0)
BKR WAM HEMOGLOBIN: 10.5 g/dL — ABNORMAL LOW (ref 12.0–18.0)
BKR WAM IMMATURE GRANULOCYTES: 2.3 % (ref 0.0–3.0)
BKR WAM LYMPHOCYTES: 7.3 % — ABNORMAL LOW (ref 8.0–49.0)
BKR WAM MCH (PG): 31.6 pg — ABNORMAL HIGH (ref 27.0–31.0)
BKR WAM MCHC: 33.3 g/dL (ref 31.0–36.0)
BKR WAM MCV: 94.9 fL — ABNORMAL HIGH (ref 78.0–94.0)
BKR WAM MONOCYTE ABSOLUTE COUNT: 1 x 1000/??L (ref 0.0–2.0)
BKR WAM MONOCYTES: 4 % (ref 4.0–15.0)
BKR WAM MPV: 10 fL — ABNORMAL HIGH (ref 6.0–11.0)
BKR WAM NEUTROPHILS: 86.2 % — ABNORMAL HIGH (ref 37.0–84.0)
BKR WAM NUCLEATED RED BLOOD CELLS: 0 % (ref 0.0–1.0)
BKR WAM PLATELETS: 249 x1000/??L — ABNORMAL HIGH (ref 140–440)
BKR WAM RDW-CV: 14.8 % — ABNORMAL HIGH (ref 11.5–14.5)
BKR WAM RED BLOOD CELL COUNT: 3.3 M/ÂµL — ABNORMAL LOW (ref 3.8–5.9)
BKR WAM WHITE BLOOD CELL COUNT: 25.3 x1000/??L — ABNORMAL HIGH (ref 4.0–10.0)

## 2020-06-02 LAB — COMPREHENSIVE METABOLIC PANEL
BKR A/G RATIO: 1.2 (ref 1.0–2.2)
BKR ALANINE AMINOTRANSFERASE (ALT): 93 U/L — ABNORMAL HIGH (ref 9–59)
BKR ALBUMIN: 3.1 g/dL — ABNORMAL LOW (ref 3.6–4.9)
BKR ALKALINE PHOSPHATASE: 95 U/L (ref 9–122)
BKR ANION GAP: 11 (ref 7–17)
BKR ASPARTATE AMINOTRANSFERASE (AST): 19 U/L (ref 10–35)
BKR AST/ALT RATIO: 0.2 %
BKR BILIRUBIN TOTAL: 0.2 mg/dL (ref <=1.2)
BKR BLOOD UREA NITROGEN: 33 mg/dL — ABNORMAL HIGH (ref 8–23)
BKR BUN / CREAT RATIO: 21.9 (ref 8.0–23.0)
BKR CALCIUM: 8.6 mg/dL — ABNORMAL LOW (ref 8.8–10.2)
BKR CHLORIDE: 106 mmol/L — ABNORMAL LOW (ref 98–107)
BKR CO2: 25 mmol/L (ref 20–30)
BKR CREATININE: 1.51 mg/dL — ABNORMAL HIGH (ref 0.40–1.30)
BKR EGFR (AFR AMER): 56 mL/min/{1.73_m2} (ref >60)
BKR EGFR (NON AFRICAN AMERICAN): 46 mL/min/{1.73_m2} (ref >60)
BKR GLOBULIN: 2.5 g/dL (ref 2.3–3.5)
BKR GLUCOSE: 205 mg/dL — ABNORMAL HIGH (ref 70–100)
BKR POTASSIUM: 3.6 mmol/L — ABNORMAL LOW (ref 3.3–5.1)
BKR PROTEIN TOTAL: 5.6 g/dL — ABNORMAL LOW (ref 6.6–8.7)
BKR SODIUM: 142 mmol/L — ABNORMAL LOW (ref 136–144)
BKR WAM BASOPHIL ABSOLUTE COUNT: 1.2 x 1000/??L — ABNORMAL HIGH (ref 1.0–2.2)

## 2020-06-02 LAB — TSH W/REFLEX TO FT4     (BH GH LMW Q YH): BKR THYROID STIMULATING HORMONE: 2.35 ??IU/mL

## 2020-06-02 MED ORDER — PREDNISONE 50 MG TABLET
50 mg | ORAL_TABLET | 1 refills | Status: AC
Start: 2020-06-02 — End: 2020-06-28

## 2020-06-02 MED ORDER — NICOTINE (POLACRILEX) 2 MG BUCCAL LOZENGE
2 mg | BUCCAL | 3 refills | Status: AC | PRN
Start: 2020-06-02 — End: ?

## 2020-06-02 NOTE — Progress Notes
Received staff message from Jerral Ralph, LCSW requesting CM assist with care coordination of visiting nurse services for medication management / home evaluation. Call placed to pt, explained CM role and discussed referral request. No agency preference stated. Explained referral would be sent and agency would contact directly for [facility], verbalized understanding. Referral sent to [facility] - declined.Referral sent to [facility] - declined.Referral sent to [facility] - declined. Referral sent to [facility] - declined.Referral sent to [facility] - accepted. Agency to contact directly for [facility]. Team Rosary Lively, BSN RNCare CoordinatorSmilow ClinicsGI, H&N, Thoracic, Neuro, Melanoma, UrologyPhase I, Radiation, ECC, & [place]. [phone]

## 2020-06-03 ENCOUNTER — Encounter: Admit: 2020-06-03 | Payer: PRIVATE HEALTH INSURANCE | Attending: Adult Health

## 2020-06-03 ENCOUNTER — Ambulatory Visit: Admit: 2020-06-03 | Payer: PRIVATE HEALTH INSURANCE | Attending: Adult Health

## 2020-06-03 ENCOUNTER — Telehealth: Admit: 2020-06-03 | Payer: PRIVATE HEALTH INSURANCE | Attending: Hematology

## 2020-06-03 ENCOUNTER — Ambulatory Visit: Admit: 2020-06-03 | Payer: PRIVATE HEALTH INSURANCE

## 2020-06-03 ENCOUNTER — Telehealth: Admit: 2020-06-03 | Payer: PRIVATE HEALTH INSURANCE

## 2020-06-03 ENCOUNTER — Encounter: Admit: 2020-06-03 | Payer: PRIVATE HEALTH INSURANCE

## 2020-06-03 DIAGNOSIS — I5022 Chronic systolic (congestive) heart failure: Secondary | ICD-10-CM

## 2020-06-03 DIAGNOSIS — I2510 Atherosclerotic heart disease of native coronary artery without angina pectoris: Secondary | ICD-10-CM

## 2020-06-03 DIAGNOSIS — E785 Hyperlipidemia, unspecified: Secondary | ICD-10-CM

## 2020-06-03 DIAGNOSIS — K219 Gastro-esophageal reflux disease without esophagitis: Secondary | ICD-10-CM

## 2020-06-03 DIAGNOSIS — R0609 Other forms of dyspnea: Secondary | ICD-10-CM

## 2020-06-03 DIAGNOSIS — E559 Vitamin D deficiency, unspecified: Secondary | ICD-10-CM

## 2020-06-03 DIAGNOSIS — E291 Testicular hypofunction: Secondary | ICD-10-CM

## 2020-06-03 DIAGNOSIS — M4800 Spinal stenosis, site unspecified: Secondary | ICD-10-CM

## 2020-06-03 DIAGNOSIS — I519 Heart disease, unspecified: Secondary | ICD-10-CM

## 2020-06-03 DIAGNOSIS — R0600 Dyspnea, unspecified: Secondary | ICD-10-CM

## 2020-06-03 DIAGNOSIS — F419 Anxiety disorder, unspecified: Secondary | ICD-10-CM

## 2020-06-03 DIAGNOSIS — Z720 Tobacco use: Secondary | ICD-10-CM

## 2020-06-03 DIAGNOSIS — N400 Enlarged prostate without lower urinary tract symptoms: Secondary | ICD-10-CM

## 2020-06-03 DIAGNOSIS — I10 Essential (primary) hypertension: Secondary | ICD-10-CM

## 2020-06-03 DIAGNOSIS — T884XXA Failed or difficult intubation, initial encounter: Secondary | ICD-10-CM

## 2020-06-03 DIAGNOSIS — I252 Old myocardial infarction: Secondary | ICD-10-CM

## 2020-06-03 DIAGNOSIS — I408 Other acute myocarditis: Secondary | ICD-10-CM

## 2020-06-03 DIAGNOSIS — E119 Type 2 diabetes mellitus without complications: Secondary | ICD-10-CM

## 2020-06-03 LAB — MISCELLANEOUS TEST ARUP     (YH)

## 2020-06-03 LAB — NT-PROBNPE: BKR B-TYPE NATRIURETIC PEPTIDE, PRO (PROBNP): 1845 pg/mL — ABNORMAL HIGH (ref <125.0)

## 2020-06-03 LAB — TROPONIN T     (Q): BKR TROPONIN T: 0.1 ng/mL — ABNORMAL HIGH (ref <0.01)

## 2020-06-03 MED ORDER — TRAMADOL 50 MG TABLET
50 mg | ORAL_TABLET | Freq: Three times a day (TID) | ORAL | 1 refills | Status: AC | PRN
Start: 2020-06-03 — End: ?

## 2020-06-03 NOTE — Progress Notes
SOCIAL WORK ASSESSMENTPatient Name: Paul Bourbon AstorinoMedical Record Number: [id number] Date of Birth: 06-10-1952Social Work Assessment Adult    Most Recent Value Admission Information Document Type Clinical  Assessment Reason for Encounter Psycho-Social Concerns, Resources Visitor Restriction in Place No Intervention Psycho-social Intervention, Assessment and referral to community resources Psycho-social Intervention Cognitive reframing, Clarifying and Identifying, Empathy, Validation, Problem-solving, Life review  Assessment and Referral to community services [employer] Resources Comment [place] [facility] Source of Information Patient, Geophysicist/field seismologist, Medical Team Family/Caregiver Comment sister-in-law, [facility] Medical Team Comment thoracic oncology Record Reviewed Yes Level of Care Ambulatory Patients Legal Contacts Legal Custody Status Self    Custody Status Comment not applicable Past/Current Department of Children and Families Involvement No Legal Admission Status None    Legal Admission Status Comment  not applicable Legal/Judicial Status None    Legal/Judicial Status Comment not applicable Criminal Activity/Legal Involvement Pertinent to Current Situation/Hospitalization none reported Currently on Probation / Parole? No Pending Court Dates, Technical sales engineer Charges none reported Legal Contact(s)  none Probate Court Granted Conservatorship No Conservator Notified of Admission No Legal Permission Granted to Share Information  No Legal Comment not applicable Involuntary Medication Hearing Will the patient have an Involuntary Medication Hearing? No Language needed None, Patient Speaks English Current Providers and Community Involvement Current Providers and Community Involvement A-L Chemotherapy/Radiation Therapy Chemotherapy/Radiation Therapy Practice Name [facility] Chemotherapy/Radiation Therapy Provider Name Dr. Lehman Prom, MD Chemotherapy/Radiation Therapy Phone Number [phone] Current Providers and Community Involvement M-Z None Prior to admission comment not applicable Relationships Marital Status Single Adult Significant Relationships Adult Child(ren), Other Family Members Family circumstances Paul Hunter is currently living with his sister-in-law, Paul Hunter. In May, he relocated from [facility] to [place], where he was born and raised and has a great deal of family support. Quality of Family Relationships Nurturing, Supportive Support System No concerns Noted Separation/Losses (recent): No Lives With Other Relative(s) Consulting civil engineer) Sources of Support adult child(ren), sibling(s), other family members Sexual history pertinent to current situation/hospitalization none Need for family/caregiver participation in care yes Need for Family Participation Comment Paul Hunter's immediate and extended family are closely involved in his current care. They accompany him to his scheduled appointments and are closely involved in his medical care and monitoring of symptoms at home. Primary Family Member / Social Support Expectations of Care see above Temporary Family Living Arrangements (While Hospitalized) none needed Relationship Comments Paul Hunter has a great deal of support from immediate and extended family. Abuse Screen (yes response referral indicated) Able to respond to abuse questions Yes Do you Feel That You Are Treated Well By Your Partner/Spouse/Family Member/Caregiver/Employer?  yes What Happens When You Argue/Fight With Your Partner/Spouse/Family Member? not reported Feels Unsafe at Home or Work/School no Feels Threatened by Someone no Does Anyone Try to Keep You From Having Contact with Others or Doing Things Outside Your Home? no Do you have concerns regarding someone you know having access to your MyChart account? no Mandated Referral Mandated Report Required no Mandated referral comment not applicable Abuse/Trauma History Abuse/trauma history pertinent to current situation/hospitalization None Physical/Sexual Abuse Perpetration none reported Other Sexually Reactive Behaviors none reported Education - Adult Education - Adult Unable to assess Current Education Enrollment None Literacy Read/write independently Special Needs none reported Employment/Income/Finance/Insurance Research officer, trade union Unable to assess Financial Concerns Identified No Financial Barriers to accessing medical care  None Employment Status Retired Catering manager of Therapist, occupational, Social Security Retirement Housing / Programmer, multimedia for the past 2 months Single-Family [employer]  Concerns None Able to Return to Prior Arrangements yes Able to Receive Homecare Services Yes Housing-Related Environmental Concerns No concerns Has utility company threatened to shut off services? No Food Availability No Concerns Needs healthcare-related transportation No Healthcare-related transportation concerns No Parking charges are accruing No Housing/Transportation/Environmental Comment no issues reported Mental Status Observation of Mental Status has identified Notable Findings Yes Appearance unable to assess Attitude/Demeanor/Rapport appropriate to circumstances, cooperative, pleasant Mood (typically self-described) congruent to the situation, overwhelmed, resigned Affect (typically observed) adaptable, anxious, congruent to reported mood, overwhelmed, pleasant Behavior / Motor alert, appropriate, cooperative, oriented, interactive Speech unremarkable Thought Process coherent, organized, relevant Thought Content Appropriate to Circumstance, Unremarkable Orientation no deficits recognized Language Intact Memory No Deficits Recognized Attention/Concentration Normal Judgment Good Insight Good Health Insight/Judgment no deficits recognized Reaction to Event/Health Status Adjusting, Anxious, Overwhelmed, Motivated Recent Changes in Mental Status unable to assess Chronic Factors Affecting Mental Status none General Risk Factors Recent stressful life events Modifying Factors (precipitants/stressors) Health Concerns Other Stressors (recent): Paul Hunter has had to navigate a number of different stressors over the last few months, including a new cancer diagnosis and initiation of treatment and relocation from his home in [facility] to [place] to be with family. Mental, emotional and behavioral problems co-occur with substance use No Patients level of awareness of relationship between behavioral conditions and substance use not applicable Readiness to Quit Alcohol Not applicable Readiness to [employer] Drug/Medication/Inhalant Use Not applicable    Patient's acceptance of treatment not applicable    Environmental resources facilitating recovery not applicable    Environmental obstacles inhibiting recovery not applicable Mental Status Comments Paul Hunter is alert and oriented x4 and able to appropriately participate in today's social work assessment. He presents as pleasant and was easily engaged, speaking openly about his medical needs and current circumstances. History of Psychiatric Illness/Diagnosis none reported Suicide Risk Assessment Reason for Assessment Utilizing SAFE-T and C-SSRS (Check all that apply) Social Work Consult/Assessment C-SSRS Able to Assess Screening for suicidal ideation within Past Month C-SSRS #1: Have you wished you were dead or wished you could go to sleep and not wake up? (If Yes, answer #3 - #5) No C-SSRS #2: Have you actually had any thoughts of killing yourself? (If Yes, answer #3 - #5) No C-SSRS #6: Have you ever done anything, started to do anything or prepared to do anything to end your life? (Suicidal behavior over your lifetime) No Specific Questions about Thoughts, Plans, Suicidal Intent (SAFE-T) Negative responses above do not indicate a need for SAFE-T assessment Risk Assessment Access to Lethal Methods?  (firearm in home or access/presence of other lethal methods) No Risk Assessment Able to Assess Risk to Self Able to Assess Risk to Self - Self-Injurious Behavior None identified Attitudes regarding Self-Injury None disclosed Imminent Risk for Self-Injury in Community Low Imminent Risk for Self-Injury in Facility Low Risk to Others Able to Assess Risk to Others None Disclosed Attitude regarding Aggression / Violence None Disclosed Imminent Risk for Violence in Community Low Imminent Risk for Violence in Facility Low Current and Past Psychiatric Diagnoses Able to Assess Mood Disorder No Psychotic Disorder No Substance Use Disorder No Post-Traumatic Stress Disorder (PTSD) No Attention Deficit/Hyperactivity Disorder (ADHD) No Traumatic Brain Injury (TBI) No Cluster B Personality Disorders or Traits (i.e. Borderline, Antisocial, Histrionic & Narcissistic) No Conduct Problems (Antisocial Behavior, Aggression, Impulsivity) No Suicide Attempt No Prior Attempts Presenting Symptoms None Family History None reported Precipitants/Stressors Chronic physical pain/Other acute medical, Social Isolation, Stressful Life Events Change in Mental  Health or Substance Use Disorder Treatment Not receiving treatment Historical Risk Factors Recent stress Protective Factors Able to Assess Protective Factors - Internal Ability to cope with stress, Religious beliefs, Identifies reasons for living, Problem solving skills, Future oriented, Frustration tolerance Protective Factors - External Cultural, spiritual and/or moral attitudes against suicide, Supportive social network of friends or family This patient was screened using the [place] Suicide Severity Rating Scale (CSSRS)  Yes I conducted a suicide risk assessment including a suicide inquiry and assessment of risk and protective factors, as recommended by the standard Suicide Assessment Five-Step Evaluation and Triage (SAFE-T) for Mental Health Professionals. No, the C-SSRS did not produce a positive screen Cause for concern None Based on my assessment, the level of risk for this patient to suicide in an inpatient or emergency setting is:  MINIMAL because the patient does not present with suicidal ideation, does not have a history of suicide attempts, and the balance of protective factors outweighs any current risk factors Based on my assessment, the level of risk for this patient to suicide in the community is:  MINIMAL Recommended Next Steps Remain in/Return to Community Remain in/Return to [employer] factors include Protective factors include Coping skills are adequate, Communicative, Accepts/commits to plan for continued treatment, Cooperative with interview, Engaging, Expressed motivation to not act self-destructively, Expresses reasons to live, Future-oriented, Positive reality testing, Receptive to change, Responsible for children, Supportive other at home to assist with safety, No history of suicidal attempts, gestures, or plans, No access to weapons, Reassuring collateral information, Family support, Homecare involvement, Access to healthcare Substance Use Substances Used Tobacco use Active substance abuse Patient Denies    Patient's acceptance of treatment not applicable    Environmental resources facilitating recovery not applicable    Environmental obstacles inhibiting recovery not applicable Exposure to [employer] none Previous Substance Use Treatment none Tobacco Use Tobacco Type cigarettes, tobacco Alcohol Use Alcohol Amount Per Day In Past Year 0-->none Substance Use, Caregiver Caregiver Substance Use Unable to Assess Coping Reaction to Event/Health Status Adjusting, Anxious, Overwhelmed, Motivated FICA Spiritual Assessment Tool Need for spiritual support  Unable to assess Needs Assessment  Concerns to be Addressed adjustment to diagnosis/illness Concerns Comments adjustment to cancer diagnosis and transition to new care team Readmission Within the Last 30 Days unresolved symptoms Needs in the Community chronically ill Anticipated Facility/Agency/Outpatient/Support Group Need(s)  homecare services Home Health Care Services Required Nursing, Home Health Aide, Physical Therapy Equipment Needed After Discharge none Assessment Needs Comment not applicable in ambulatory setting Narrative/Signoff Identified Clinical/Disposition, Issues/Barriers: adjustment to cancer diagnosis and transition to new care team Intervention(s)/Summary 30 minutes were spent in direct contact by phone with Paul Hunter at the request of the thoracic oncology team following his appointment with Dr. Lehman Prom, MD yesterday. Paul Hunter was joined on the phone by his sister-in-law, Paul Hunter, whom he is currently staying with following his relocation from [place] to [place] in May. He has been referred to Dr. Kathrynn Ducking for continued management of mestastatic bladder cancer but I reached out today to assess coping and offer support in the setting of a multitude of stressors Paul Hunter has encountered in recent months. I introduced the role and scope of social work services in the ambulatory setting at [facility]. Paul Hunter was pleasant and easily engaged and spoke openly about his initial cancer diagnosis and initiation of treatment. He made the decision to relocate to [place], where he was born and raised, to be closer to his extensive support  system of immediate and extended family members. In addition to his sister-in-law and niece, Paul Hunter's adult son and daughter also live locally with their families. He reports that he has regular visits and has been spending time with his two young grandchildren. Family has been working hard to get him out of the house and engaged in activities to distract him from his current medical needs. Overall, Paul Hunter admits to having good and bad days, which he views as normal given his current circumstances. He otherwise appears to be coping appropriately at this time. I collaborated with case management to request a nursing referral to help with medication management. I also provided Paul Hunter and his family with information on [facility], which they inquired about. They were appreciative of my outreach, support, and resources offered today and are aware of my ongoing availability for assistance with any emerging needs. Collaboration with Treatment Team/Community Providers/Family: thoracic oncology,  sister-in-law, Paul Hunter Referrals/Resources Provided: Ambulatory not applicable Outcome Resolved Handoff Required? No Next Steps/Plan (including hand-off): Social work intervention is complete for today. Please re-consult social work for assistance with any emerging needs. Signature: Jerral Ralph, LCSW Contact Information: [phone]

## 2020-06-03 NOTE — Telephone Encounter
RTC to [place]. Informed her that she should call the cardiologist with these symptoms since they just saw him this morning.  She agrees to call.  Counseled to call us back if needed.

## 2020-06-03 NOTE — Telephone Encounter
Recvd call from [place]. The saw Dr. Meridee Score today. At that time his BP was 90/58  He had a dizzy spell and just now it 84/58. Would like advice on what they do.  Spoke w [place] earlier.[name]-[phone]

## 2020-06-03 NOTE — Telephone Encounter
I called the patient and spoke with [name].  BP is now 88/56If he has BP continues to be low, < 90 systolic, he can hold the LisinoprilIf abdominal pain persists at a severe level, would have him go to the ED as the etiology of his pain is not clear.  [name] verbalizes understanding

## 2020-06-03 NOTE — Telephone Encounter
DX: metastatic urothelial carcinomaMD: Dr.  Moody Bruins MD: Dr.  Jimmie Molly call made to patient prior to oncology consult appointment that is scheduled with Dr.  Kathrynn Ducking  on  06/09/2020 . Introduced myself and explained my role. We discussed what to expect at the first visit. Reviewed directions and parking instructions. Past medical history and current medications reviewed and updated. Pt was asked to bring updated medication list on the day of the visit as he reports that his medications have changes frequently in the past couple of months. Pt is being referred for metastatic bladder cancer.  He has a remote history of microscopic hematuria and kidney stones.  He reports that he has had issues with hematuria over the past 3.5 years.  In early 2020 he was experiencing worsening DOE and a CXR revealed lung nodules and further work up showed adenopathy.  Multiple biopsies were inconclusive but showed metastatic carcinoma.  He was treated with carbo/taxol/pembro x4 cycles and then transferred his care to [place] where he has significant family support.  He had an NSTEMI and was hospitalized and placed on [facility].  This was when he developed hematuria.  He was again admitted and cysto/TURBT revealed bladder cancer.  This was compared to his prior EBUS cytology and findings were c/w urothelial carcinoma.  Pt is now having his care transferred to our service for further management.  Pt state that he is doing well considering and is very appreciative of the care he has received.  He reports having significant family support here in .  He is currently living in an in law apartment at his ex niece's house.  He has VNA services coming in for medication management and for ADL assistance.  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  He expressed frustration because he had been tapered from 130 mg to 80mg  but has subsequently had to increase his steroids back to 130 mg.  Patient doesn't ambulate well and fatigues quickly and has DOE.  He requires a wheelchair to travel long distances.  He also is not able to drive alone at this time and requires rides from family/friends.  He is a current smoker but in the process of quitting and has an appt with the smoking cessation program at [place].  He has no family hx of cancer and genetic testing that was done while he was in [place] appears to have been insufficient for complete sequencing.Office contact information provided and was encouraged to call for any future questions or concerns. Pt is a fall risk?.fall prevention reviewed and patient will need fall alert bracelet placed on the day of the visit.

## 2020-06-03 NOTE — Telephone Encounter
Called [name] back to give her the message. States she was helping pt to the couch and he just went down-Lt arm is bruised and bleeding which she wrapped up. Pain in the stomach is worse. Advised her okay to take the pain med that was prescribed and message would be sent to [facility].

## 2020-06-03 NOTE — Patient Instructions
No change in cardiac medicationsKeep Prednisone 130 mg a day for nowLabs in one week - [place] labKeep appointment for Cardiac MRI on 10/26My Chart video appointment 06/24/2020.

## 2020-06-03 NOTE — Telephone Encounter
Noted.  Would not advise any change/decrease in medications at this time.  Thank you

## 2020-06-03 NOTE — Progress Notes
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  He has Stage IV NSCLC (abdomen, chest).  He transitioned his care from [place] to [place], started on Carbo/Taxol/Pembro 11/28/19 s/p 2 cycles, and 2 more cycles at [facility] most recent 03/10/20.  He was seen for initial consult by CardioOncology 04/08/2020. A cardiac MRI was done 04/19/2020 and was consistent with ICI myocarditis.  Started on Prednisone 130 mg daily and therapy placed on hold.Carbo/Taxol/Pembro remains on hold.A PET stress test was performed 05/06/2020:  Highly abnormal PET myocardial perfusion study following pharmacologic vasodilation with regadenoson and at rest with high risk features.  Perfusion imaging was abnormal showing a large sized, moderate to severe intensity, reversible perfusion defect in the basal to apical inferior and inferolateral walls consistent with ischemia; and a medium to large sized, moderate intensity, reversible perfusion defect in the mid to apical anterior and anterolateral walls consistent with ischemia.  There is visual transient ischemic dilation  There was transient post-stress LV dysfunction, resting ejection fraction 40% and stress ejection fraction 36% with abnormal regional wall motion showing global hypokinesis and inferior akinesis.  The left ventricle was severely enlarged in size.  Stress electrocardiogram was abnormal due to ischemic ECG changes.  [place] performed for attenuation correction showed coronary artery calcification in the left main artery (moderate), left anterior descending artery (severe), diagonal artery (severe), left circumflex artery (mild), and right coronary artery (mild).    Non-cardiac findings of the [place] are described in a separate report from Radiology.  No prior study available for comparison.Global myocardial blood flow was reduced at 0.78 ml/g/min during stress and normal at 0.68 ml/g/min during rest.  Global myocardial flow reserve was reduced at 1.15.Repeat cMRI 05/12/2020:  IMPRESSION:1. Mildly enlarged left ventricular size. Mildly depressed left ventricular systolic function. LVEF: 49%. There is mild to moderate asymmetric myocardial hypertrophy with the basal to mid septum measuring the thickest, up to 16mm at the basal anteroseptum.  There is basal to mid inferoseptal/inferior wall hypokinesis and associated thinning. Two areas of ischemic delayed enhancement:  subendocardial delayed enhancement basal to mid inferior/inferoseptal wall with 50-75% transmurality, consistent with known myocardial infarction in the distribution of the RCA and a second area that was possibly present on the prior study 04/19/20 but more subtle with subendocardial delayed enhancement in the basal to mid anterior/anteroseptal/anterolateral wall with ~25% transmurality (consistent with prior LAD MI). T2 hyperintensity is noted in a similar location as prior study and heterogeneous mid myocardial delayed enhancement in the basal lateral segments, concerning for myocarditis, possibly related to check point inhibitor therapy and known prior infarct in LAD and RCA distribution. 2. Normal right ventricular size. Normal right ventricular systolic function.  RVEF: 54%. No right ventricular delayed myocardial enhancement.3. No significant valvular abnormalities.4. Moderate biatrial enlargement5. Dilatation of the ascending aorta up to 4 cm.  Enlarged pulmonary artery may be seen in pulmonary hypertension. Compared to prior study dated 04/19/2020, LVEF appears similar, with possible slight improvement from 44% and RVEF has normalized. Due to technical and artifactual differences, T2 appears in similar location, but cannot quantify differences. DGE burden also appear in the same locations as prior study. Since last evaluated in the office, results of testing as above.  He has been admitted  to [facility] X 4 since that time; Briefly summarized here9/13 to 9/16:  CHF and hematuria.  9/23 - 9/24:  S/p outpatient cystoscopy and admitted with urinary retention and pain9/25 - 05/25/20:  Hematuria. Cystoscopy X 2 with clot evacuation, TURBT, papillary tumor and fulguration.  Pathology + for invasive urothelial carcinoma ;  He is scheduled to see Dr. Kathrynn Ducking 10/13/202110/1 to 05/30/2020:  Presyncope likely secondary to hypovolemia, chest pain; Troponins elevated and Prednisone increased back to 130 mg daily. He presented today for follow-up.  He had an episode of hematuria yesterday in the setting of mistakenly taking his Plavix for 2 days.  He has extreme fatigue and generalized weakness.  He is short of breath with minimal exertion.  He also has abdominal pain in his left lower quadrant for the last few days, worse in the morning and better by the afternoon.  He is requesting pain medication for this.  He has lower extremity edema which improves slightly with elevating his legs.  He has had a couple episodes of dizziness and lightheadedness, associated with low blood pressure.  He denies PND, orthopnea, palpitations, syncope.  He denies chest pain.He is scheduled to see Dr Kathrynn Ducking 10/13. He is scheduled to see palliative care October 28.  Problem list:Patient Active Problem List  Diagnosis Date Noted ? Chest pain, rule out acute myocardial infarction 05/27/2020 ? Mediastinal lymphadenopathy 05/24/2020 ? Bladder tumor 05/22/2020 ? Hemorrhagic cystitis 05/21/2020 ? Urinary catheter complication, initial encounter (HC Code) (HC CODE) 05/20/2020 ? DOE (dyspnea on exertion) 05/09/2020 ? Pulmonary emphysema, unspecified emphysema type (HC Code) (HC CODE) 03/28/2020 ? Dyspnea on exertion 03/28/2020 ? Sleep apnea, unspecified type 03/28/2020 ? Squamous cell carcinoma of lung, stage IV (HC Code) (HC CODE) 01/07/2020 ? GERD (gastroesophageal reflux disease) 08/14/2011 ? CAD (coronary artery disease)  ? Diabetes mellitus (HC Code) (HC CODE)  ? Hyperlipidemia  ? Hypertension  Family history:Medications:Current Outpatient Medications Medication Sig Dispense Refill ? albuterol sulfate 90 mcg/actuation HFA aerosol inhaler Inhale 2 puffs into the lungs every 6 (six) hours as needed for wheezing or shortness of breath. 6.7 g 0 ? aspirin 81 MG EC tablet Take 81 mg by mouth daily.   ? atorvastatin (LIPITOR) 80 mg tablet Take 1 tablet (80 mg total) by mouth nightly. 30 tablet 6 ? belladonna alkaloids-opium (B&O SUPPRETTES) 16.2-30 mg suppository Place 1 suppository rectally 2 (two) times daily as needed for bladder spasm. 12 suppository 0 ? calcium carbonate-vitamin D3 500 mg(1,250mg ) -400 unit per tablet Take 1 tablet by mouth 2 (two) times daily. (Patient not taking: Reported on 06/02/2020) 60 tablet 2 ? carboxymethylcellulose (REFRESH PLUS) 0.5 % ophthalmic solution in dropperette Place 1 drop into both eyes 3 (three) times daily as needed.   ? escitalopram oxalate (LEXAPRO) 10 mg tablet Take 1 tablet (10 mg total) by mouth nightly. 90 tablet 3 ? fexofenadine (ALLEGRA) 180 mg tablet Take 180 mg by mouth daily as needed.    ? furosemide (LASIX) 40 mg tablet Take 0.5 tablets (20 mg total) by mouth daily. 30 tablet 2 ? gabapentin (NEURONTIN) 300 mg capsule Take 2 capsules (600 mg total) by mouth nightly. 60 capsule 0 ? lisinopriL (PRINIVIL,ZESTRIL) 2.5 mg tablet Take 2.5 mg by mouth daily.   ? LORazepam (ATIVAN) 0.5 mg tablet Take 1 tablet (0.5 mg total) by mouth every 6 (six) hours as needed for anxiety. (Patient not taking: Reported on 06/02/2020) 15 tablet 0 ? metoprolol succinate XL (TOPROL-XL) 25 mg 24 hr tablet Take 0.5 tablets (12.5 mg  total) by mouth daily. Take with or immediately following a meal. 30 tablet 0 ? multivitamin capsule Take 1 capsule by mouth daily.  (Patient not taking: Reported on 06/02/2020)   ? nicotine (NICODERM CQ) 7 mg transdermal patch Place 1 patch (7 mg total) onto the skin daily. Alternate arms/sites when applying patch. Discard old patch when applying new one 28 patch 0 ? nicotine polacrilex (COMMIT) 2 mg lozenge Place 1 lozenge (2 mg total) inside cheek every 2 (two) hours as needed for smoking cessation. 108 each 2 ? oxybutynin XL (DITROPAN-XL) 5 mg 24 hr tablet Take 1 tablet (5 mg total) by mouth daily. 90 tablet 0 ? oxyCODONE (ROXICODONE) 5 mg Immediate Release tablet Take 1 tablet (5 mg total) by mouth every 4 (four) hours as needed for up to 5 doses. (Patient not taking: Reported on 06/02/2020) 5 tablet 0 ? pantoprazole (PROTONIX) 40 mg tablet Take 1 tablet (40 mg total) by mouth daily. 30 tablet 2 ? polyethylene glycol (MIRALAX) 17 gram packet Take 1 packet (17 g total) by mouth daily as needed for constipation. Mix in 8 ounces of water, juice, soda, coffee or tea prior to taking. 14 each 0 ? [START ON 08/09/2020] predniSONE (DELTASONE) 10 mg tablet Take 3 tablets (30 mg total) by mouth daily for 7 days, THEN 2 tablets (20 mg total) daily for 7 days, THEN 1 tablet (10 mg total) daily for 7 days, THEN 0.5 tablets (5 mg total) daily for 7 days, THEN 0.5 tablets (5 mg total) daily for 7 days. Take with food.. 49 tablet 0 ? predniSONE (DELTASONE) 10 mg tablet Take 13 tablets (130 mg total) by mouth daily for 7 days, THEN 12 tablets (120 mg total) daily for 7 days, THEN 11 tablets (110 mg total) daily for 7 days, THEN 10 tablets (100 mg total) daily for 7 days, THEN 9 tablets (90 mg total) daily for 7 days, THEN 8 tablets (80 mg total) daily for 7 days, THEN 7 tablets (70 mg total) daily for 7 days, THEN 6 tablets (60 mg total) daily for 7 days, THEN 5 tablets (50 mg total) daily for 7 days, THEN 4 tablets (40 mg total) daily for 7 days. Take with food.. 595 tablet 0 ? predniSONE (DELTASONE) 50 mg tablet Take with food. 56 tablet 0 ? senna-docusate (SENNA-PLUS) 8.6-50 mg tablet Take 1 tablet by mouth nightly. 30 tablet 0 ? simethicone (MYLICON) 80 mg chewable tablet Take 1 tablet (80 mg total) by mouth every 6 (six) hours as needed for flatulence. 30 tablet 0 ? sulfamethoxazole-trimethoprim (BACTRIM DS;CO-TRIMOXAZOLE DS) 800-160 mg per tablet Take 1 tablet by mouth Every Monday, Wednesday, and Friday. 28 tablet 0 ? tamsulosin (FLOMAX) 0.4 mg 24 hr capsule Take 1 capsule (0.4 mg total) by mouth nightly. 30 capsule 2 ? tiotropium (SPIRIVA WITH HANDIHALER) 18 mcg capsule for inhalation Inhale 18 mcg into the lungs daily. (Patient not taking: Reported on 06/02/2020)   ? walker Misc Use as directed. 1 each 0 No current facility-administered medications for this visit. Allergies:He has No Known Allergies.Review of Systems Constitutional: Positive for malaise/fatigue. Negative for decreased appetite, weight gain and weight loss. Cardiovascular: Positive for dyspnea on exertion and leg swelling. Negative for chest pain, claudication, cyanosis, irregular heartbeat, near-syncope, orthopnea, palpitations, paroxysmal nocturnal dyspnea and syncope. Respiratory: Positive for shortness of breath.  Musculoskeletal: Positive for back pain and neck pain. Gastrointestinal: Positive for abdominal pain, constipation and flatus. Negative for bloating and diarrhea. Genitourinary: Positive for hematuria. All other  systems reviewed and are negative. Vital Signs: BP (!) 90/58  - Pulse 84  - Temp 98.5 ?F (36.9 ?C)  - Resp 19  - SpO2 96% Wt Readings from Last 3 Encounters: 06/02/20 (!) 136.9 kg 05/27/20 (!) 138.3 kg 05/21/20 (!) 139 kg Physical ExamConstitutional: Pleasant and conversant, in NAD at rest.  Well-developed and well-nourished. HEENT: anictericNeck: Neck supple. No JVD. No carotid bruitsCardiovascular: PMI nondisplaced. Regular rate and rhythm. S1S2, No murmurs, rubs, or gallops.  Pulmonary/Chest:  Good inspiratory effort. Lungs clear bilaterally. No wheezes. No rales or crackles. Abdominal: Soft. Nontender.  Bowel sounds are normal.  Nondistended.  No reproducible pain with palpation. Musculoskeletal: Normal range of motion. No deformitiesExtremities:  No clubbing, cyanosis.  1-2+ bilateral LE edema.  Neurological: Alert and oriented to person, place, time, and situation. No focal deficits.  Psychiatric: Normal mood and affect. Labs:Lipid Panel:Lab Results Component Value Date  CHOL 141 05/11/2020  TRIG 133 05/11/2020  HDL 39 (L) 05/11/2020  LDL 75 05/11/2020  05/27/2020 11:43 05/27/2020 20:27 05/28/2020 05:13 05/29/2020 05:27 05/29/2020 11:19 05/30/2020 05:21 06/02/2020 08:05 B-Type Natriuretic Peptide, Pro (proBNP) 1,015.0 (H)      1,845.0 (H) Troponin T 0.05 (H) 0.06 (H) 0.05 (H) 0.04 (H)  0.07 (H) 0.10 (H)  05/29/2020 05:27 05/29/2020 11:19 05/30/2020 05:21 06/02/2020 08:05 WBC 14.8 (H)  21.0 (H) 25.3 (H) RBC 3.3 (L)  3.1 (L) 3.3 (L) Hemoglobin 10.1 (L)  9.9 (L) 10.5 (L) Hematocrit 30.8 (L)  29.4 (L) 31.5 (L) MCV 94.5 (H)  94.5 (H) 94.9 (H) MCH 31.0  31.8 (H) 31.6 (H) MCHC 32.8  33.7 33.3 RDW-CV 14.6 (H)  14.6 (H) 14.8 (H) nRBC 0.0  0.0 0.0 Platelets 248  264 249  05/30/2020 05:21 06/02/2020 08:05 Sodium 146 (H) 142 Potassium 3.8 3.6 Chloride 108 (H) 106 CO2 26 25 Anion Gap 12 11 BUN 31 (H) 33 (H) Creatinine 1.42 (H) 1.51 (H) BUN/Creatinine Ratio 21.8 21.9 eGFR (NON African-American) 50 46 eGFR (Afr Amer) 60 56 Glucose 168 (H) 205 (H) Calcium 8.5 (L) 8.6 (L) Total Bilirubin 0.2 0.2 Alkaline Phosphatase 96 95 Alanine Aminotransferase (ALT) 45 93 (H) Aspartate Aminotransferase (AST) 26 19 AST/ALT Ratio 0.6 0.2 Total Protein 5.9 (L) 5.6 (L) Albumin 3.3 (L) 3.1 (L) Globulin 2.6 2.5 A/G Ratio 1.3 1.2 EKG: Sinus rhythm, nonspecific ST and T-wave abnormalities, no changes from priorECHO:  Results for orders placed or performed during the hospital encounter of 05/27/20 Echo 2D Ltd w Doppler and CFI if Ind Image Enhancement and or 3D Result Value Ref Range  Reported Biplane EF% 66 %  Narrative   * Limited study for left ventricular function.* No regional wall motion abnormalities.  Normal left ventricular systolic function.  LVEF calculated by biplane Simpson's was 66%.* Normal right ventricular systolic function.* No evidence of pericardial effusion.* Compared with the prior study, dated 05/11/2020, there is no significant change. Results for orders placed or performed during the hospital encounter of 05/09/20 Echo 2D Complete w Doppler and CFI if Ind Image Enhancement 3D and or bubbles Result Value Ref Range  Reported Biplane EF% 68 %  Narrative   * Normal left ventricular size, systolic function and wall motion. Moderate concentric left ventricular hypertrophy.  LVEF calculated by biplane Simpson's was 68%.  Abnormal tissue Doppler suggestive of abnormal diastolic function.  The inferior and inferoseptal wall are hypokinetic* Normal right ventricular cavity size, systolic function and wall motion.No significant valvular abnormalities* Dilated sinuses of Valsalva with a diameter of 4.2 cm and dilated ascending  aorta with a diameter of 4.5 cm.  Previous study ascending aorta size was 4.4cm* Inferior vena cava was not well visualized.* Compared with the prior study, dated 02/08/2020, there are changes noted.  Wall motion in inferior and inferoseptal walls noted on current study, though compared to prior image, no significant change in wall motion abnormality. STRESS TEST: 05/06/2020:  PET stress testHighly abnormal PET myocardial perfusion study following pharmacologic vasodilation with regadenoson and at rest with high risk features.  Perfusion imaging was abnormal showing a large sized, moderate to severe intensity, reversible perfusion defect in the basal to apical inferior and inferolateral walls consistent with ischemia; and a medium to large sized, moderate intensity, reversible perfusion defect in the mid to apical anterior and anterolateral walls consistent with ischemia.  There is visual transient ischemic dilation  There was transient post-stress LV dysfunction, resting ejection fraction 40% and stress ejection fraction 36% with abnormal regional wall motion showing global hypokinesis and inferior akinesis.  The left ventricle was severely enlarged in size.  Stress electrocardiogram was abnormal due to ischemic ECG changes.  [place] performed for attenuation correction showed coronary artery calcification in the left main artery (moderate), left anterior descending artery (severe), diagonal artery (severe), left circumflex artery (mild), and right coronary artery (mild).    Non-cardiac findings of the [place] are described in a separate report from Radiology.  No prior study available for comparison.Global myocardial blood flow was reduced at 0.78 ml/g/min during stress and normal at 0.68 ml/g/min during rest.  Global myocardial flow reserve was reduced at 1.15. Repeat cMRI 05/12/2020:  IMPRESSION:1. Mildly enlarged left ventricular size. Mildly depressed left ventricular systolic function. LVEF: 49%. There is mild to moderate asymmetric myocardial hypertrophy with the basal to mid septum measuring the thickest, up to 16mm at the basal anteroseptum.  There is basal to mid inferoseptal/inferior wall hypokinesis and associated thinning. Two areas of ischemic delayed enhancement:  subendocardial delayed enhancement basal to mid inferior/inferoseptal wall with 50-75% transmurality, consistent with known myocardial infarction in the distribution of the RCA and a second area that was possibly present on the prior study 04/19/20 but more subtle with subendocardial delayed enhancement in the basal to mid anterior/anteroseptal/anterolateral wall with ~25% transmurality (consistent with prior LAD MI). T2 hyperintensity is noted in a similar location as prior study and heterogeneous mid myocardial delayed enhancement in the basal lateral segments, concerning for myocarditis, possibly related to check point inhibitor therapy and known prior infarct in LAD and RCA distribution. 2. Normal right ventricular size. Normal right ventricular systolic function.  RVEF: 54%. No right ventricular delayed myocardial enhancement.3. No significant valvular abnormalities.4. Moderate biatrial enlargement5. Dilatation of the ascending aorta up to 4 cm.  Enlarged pulmonary artery may be seen in pulmonary hypertension. Compared to prior study dated 04/19/2020, LVEF appears similar, with possible slight improvement from 44% and RVEF has normalized. Due to technical and artifactual differences, T2 appears in similar location, but cannot quantify differences. DGE burden also appear in the same locations as prior study. ASSESSMENT:69 year old gentleman with a complex medical history as described above.  He has CAD, status post multiple PCI, stage IV NSCLC, newly diagnosed invasive urothelial carcinoma.?	ICI mediated myocarditis, on high dose steroids; Troponin is trending up, 0.10 by labs yesterday.  Chronic troponin elevation may also be due to chronic ischemia?	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  Fortunately, he is without angina symptoms.?	Diastolic heart failure.  Oxygenating well and clear lungs but has lower extremity edema.  ProBNP level is trending up; Recent LVEF is improved.?	Abdominal pain, unclear etiology?	Relative hypotension precludes increasing his cardiac medication regimen?	Hematuria in the setting of taking clopidogrel for 2 days.  He has since stopped the clopidogrel and has no hematuria today.?	Abdominal pain, unclear etiology.PLAN:Continue metoprolol ER 12.5 milligrams daily, lisinopril 2.5 milligrams daily, Lasix 20 milligrams daily.  I have not made any medication changes today.For now, will continue Prednisone 130 mg dailyWill check repeat troponin next week.  Hopefully, can taper at that point.A repeat cardiac MRI has been scheduled for the end of October and I will have a mychart video appt with him after that.If his hematuria is resolved, we can consider rechallenging him with clopidogrel at that time.Currently, not an appropriate candidate for invasive cardiac evaluation/cardiac catheterization.I have given him a small supply of Tramadol for pain, but asked him to contact primary oncology provider for refills if neededI encouraged the patient to call me with any questions or any worsening or new symptoms.Lorain Childes, APRN, [facility] [phone] Signed by Jeanelle Malling, APRN, June 03, 2020

## 2020-06-03 NOTE — Telephone Encounter
Received message from answering service-pt had called about 3 pm. Call back to pt [phone]-spoke to pt's sister Paul Hunter.  Pt had an episode of dizziness earlier while he was standing. Pt sat down- B/P 84/58. Reassured sister B/P only 90 systolic at office visit today.The dizziness has resolved-B/P checked while we were talking- Rt Arm 136/80 Lt arm 115/86. Pt had lunch,[name] states working on taking fluids. Pt took Lisinopril and Metoprolol this am.Pt has complaints of feeling exhausted. Advised pt to put up his feet and rest. Message to be sent to [facility].

## 2020-06-04 ENCOUNTER — Telehealth: Admit: 2020-06-04 | Payer: PRIVATE HEALTH INSURANCE | Attending: Medical Oncology

## 2020-06-04 ENCOUNTER — Encounter: Admit: 2020-06-04 | Payer: PRIVATE HEALTH INSURANCE | Attending: Medical Oncology

## 2020-06-04 MED ORDER — OXYCODONE IMMEDIATE RELEASE 5 MG TABLET
5 mg | ORAL_TABLET | ORAL | 1 refills | Status: AC | PRN
Start: 2020-06-04 — End: 2020-06-04

## 2020-06-04 MED ORDER — OXYCODONE IMMEDIATE RELEASE 5 MG TABLET
5 mg | ORAL_TABLET | ORAL | 1 refills | Status: AC | PRN
Start: 2020-06-04 — End: 2020-06-16

## 2020-06-04 NOTE — Telephone Encounter
APP On-Call Telephone NotePatient's sister [name]) called into [facility] team this afternoon. Her brother was discharged from [facility] on 05/30/2020. He has been newly diagnosed with bladder cancer. He has persistent bladder spasms for which he has been taking Ditropan XL 5 mg po daily with little effect. He denies any burning or irritative symptomsPlan: Increase Ditropan XL to 10 mg po dailyInstructed to take oxycodone 5 mg po Q4 hours as needed with Miralax daily for  bowel prophylaxis; Prescription electronically sent to Pharmacy

## 2020-06-04 NOTE — Telephone Encounter
APP on-Call Telephone NoteKathy, [facility] into [facility] Medical Oncology team this afternoon. Patient will be admitted to [facility] services on Monday. The above will be communicated to Dr. Lehman Prom and her team.

## 2020-06-07 ENCOUNTER — Encounter: Admit: 2020-06-07 | Payer: PRIVATE HEALTH INSURANCE | Attending: Vascular and Interventional Radiology

## 2020-06-08 ENCOUNTER — Ambulatory Visit: Admit: 2020-06-08 | Payer: PRIVATE HEALTH INSURANCE | Attending: Pain Medicine

## 2020-06-09 ENCOUNTER — Encounter: Admit: 2020-06-09 | Payer: PRIVATE HEALTH INSURANCE

## 2020-06-09 ENCOUNTER — Encounter: Admit: 2020-06-09 | Payer: PRIVATE HEALTH INSURANCE | Attending: Medical Oncology

## 2020-06-09 ENCOUNTER — Encounter: Admit: 2020-06-09 | Payer: PRIVATE HEALTH INSURANCE | Attending: Hematology

## 2020-06-09 ENCOUNTER — Ambulatory Visit: Admit: 2020-06-09 | Payer: PRIVATE HEALTH INSURANCE | Attending: Medical Oncology

## 2020-06-09 DIAGNOSIS — E291 Testicular hypofunction: Secondary | ICD-10-CM

## 2020-06-09 DIAGNOSIS — N400 Enlarged prostate without lower urinary tract symptoms: Secondary | ICD-10-CM

## 2020-06-09 DIAGNOSIS — M4800 Spinal stenosis, site unspecified: Secondary | ICD-10-CM

## 2020-06-09 DIAGNOSIS — I10 Essential (primary) hypertension: Secondary | ICD-10-CM

## 2020-06-09 DIAGNOSIS — E119 Type 2 diabetes mellitus without complications: Secondary | ICD-10-CM

## 2020-06-09 DIAGNOSIS — Z720 Tobacco use: Secondary | ICD-10-CM

## 2020-06-09 DIAGNOSIS — F419 Anxiety disorder, unspecified: Secondary | ICD-10-CM

## 2020-06-09 DIAGNOSIS — T884XXA Failed or difficult intubation, initial encounter: Secondary | ICD-10-CM

## 2020-06-09 DIAGNOSIS — E559 Vitamin D deficiency, unspecified: Secondary | ICD-10-CM

## 2020-06-09 DIAGNOSIS — K219 Gastro-esophageal reflux disease without esophagitis: Secondary | ICD-10-CM

## 2020-06-09 DIAGNOSIS — I2510 Atherosclerotic heart disease of native coronary artery without angina pectoris: Secondary | ICD-10-CM

## 2020-06-09 DIAGNOSIS — C679 Malignant neoplasm of bladder, unspecified: Secondary | ICD-10-CM

## 2020-06-09 DIAGNOSIS — E785 Hyperlipidemia, unspecified: Secondary | ICD-10-CM

## 2020-06-09 MED ORDER — NICOTINE 21 MG/24 HR DAILY TRANSDERMAL PATCH
21 mg | Status: AC
Start: 2020-06-09 — End: ?

## 2020-06-09 MED ORDER — OXYCODONE ER 10 MG TABLET,CRUSH RESISTANT,EXTENDED RELEASE 12 HR
10 mg | ORAL_TABLET | Freq: Two times a day (BID) | ORAL | 1 refills | Status: AC
Start: 2020-06-09 — End: 2020-06-16

## 2020-06-09 NOTE — Progress Notes
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 gentleman with 75py smoking history, history of CAD s/p MIx2 and PCI, who has now transfered care from [place] to [place] for his stage IV NSCLC.  ?He noticed worsening DOE around November and December 2020.  Chest Xray done in 09/2019 showed bilateral pulmonary nodules and chest [place] done shortly after showed numerous bilateral nodules with dominant left lung nodule measuring 1.4cm, as well as bilateral axillary, hilar and mediastinal nodes.  PET [place] 11/04/19 showed hyperavid lung nodules as well as diffuse adenopathy in neck, chest and upper abdomen.  ?He underwent ultrasound guided biopsy of infraclavicular lymph node on 11/18/19, which showed focal atypical cells concerning for carcinoma.   He subsequently underwent EBUS with biopsy of station 7 node, which was positive for metastatic squamous cell carcinoma.  There was insufficient material for molecular testing or PD-L1 staining.  11/28/19-03/10/20: Carbo/Taxol/Pembro - received 2 cycles in [place] prior to transitioning care.Current Treatment:  Carbo/Taxol/Pembro q21 days - ON-HOLDInterim History: Paul Hunter presents today for followup. He is accompanied by his sister. Paul Hunter has had multiple recent hospitalizations for recurrent hematuria and more recently pre-syncope, though 2/2 hypovolemia. While admitted, he was seen by urology, who performed a cystoscopy and TURBT, with path consistent with urothelial carcinoma. On his most recent hospitalization from 10/1-10/4 for presyncope, he was noted to have a recurrent uptrend in troponin, for which his steroids were increased, per Cardio-Oncology. He was discharged on pred 130mg  with a planned taper by 10mg  weekly. Since discharge home on 10/4, he states that his gross hematuria has fully resolved since the TURBT. He has also remained off of clopidogrel. He continues feeling intermittently exhausted. His activity is further limited by his chronic lower back pain, as he was unable to make [facility] to continue CSI due to his recent admission. He does also remains intermittently dyspneic and continues to have a non-productive cough. He has not been using his inhalers, as he did not feel they were effective.  His leg swelling has improved overall, though he has noted an association with dietary intake. He does continue to experience indigestion, jitteriness, and insomnia on steroids. Review of Systems: Pertinent items are noted in HPI.  A 12-point review of systems was reviewed and is otherwise negative except as stated in the HPI.Review of Systems Constitutional: Positive for malaise/fatigue. Negative for chills, diaphoresis, fever and weight loss. Respiratory: Positive for cough and shortness of breath. Negative for hemoptysis and sputum production.  Cardiovascular: Positive for leg swelling. Negative for chest pain and palpitations. Gastrointestinal: Positive for constipation and heartburn. Negative for abdominal pain, blood in stool, diarrhea, melena, nausea and vomiting. Genitourinary: Negative for hematuria. Musculoskeletal: Positive for back pain. Negative for myalgias and neck pain. Neurological: Negative for dizziness and focal weakness. Allergies: Patient has no known allergies.Medications: Current Outpatient Medications Medication Sig ? albuterol sulfate 90 mcg/actuation HFA aerosol inhaler Inhale 2 puffs into the lungs every 6 (six) hours as needed for wheezing or shortness of breath. ? aspirin 81 MG EC tablet Take 81 mg by mouth daily. ? atorvastatin (LIPITOR) 80 mg tablet Take 1 tablet (80 mg total) by mouth nightly. ? belladonna alkaloids-opium (B&O SUPPRETTES) 16.2-30 mg suppository Place 1 suppository rectally 2 (two) times daily as needed for bladder spasm. ? calcium  carbonate-vitamin D3 500 mg(1,250mg ) -400 unit per tablet Take 1 tablet by mouth 2 (two) times daily. ? carboxymethylcellulose (REFRESH PLUS) 0.5 % ophthalmic solution in dropperette Place 1 drop into both eyes 3 (three) times daily as needed. ? escitalopram oxalate (LEXAPRO) 10 mg tablet Take 1 tablet (10 mg total) by mouth nightly. ? fexofenadine (ALLEGRA) 180 mg tablet Take 180 mg by mouth daily as needed.  ? furosemide (LASIX) 40 mg tablet Take 0.5 tablets (20 mg total) by mouth daily. ? gabapentin (NEURONTIN) 300 mg capsule Take 2 capsules (600 mg total) by mouth nightly. ? lisinopriL (PRINIVIL,ZESTRIL) 2.5 mg tablet Take 2.5 mg by mouth daily. ? LORazepam (ATIVAN) 0.5 mg tablet Take 1 tablet (0.5 mg total) by mouth every 6 (six) hours as needed for anxiety. ? metoprolol succinate XL (TOPROL-XL) 25 mg 24 hr tablet Take 0.5 tablets (12.5 mg total) by mouth daily. Take with or immediately following a meal. ? multivitamin capsule Take 1 capsule by mouth daily.  ? nicotine (NICODERM CQ) 7 mg transdermal patch Place 1 patch (7 mg total) onto the skin daily. Alternate arms/sites when applying patch. Discard old patch when applying new one ? oxybutynin XL (DITROPAN-XL) 5 mg 24 hr tablet Take 1 tablet (5 mg total) by mouth daily. ? oxyCODONE (ROXICODONE) 5 mg Immediate Release tablet Take 1 tablet (5 mg total) by mouth every 4 (four) hours as needed for up to 5 doses. ? pantoprazole (PROTONIX) 40 mg tablet Take 1 tablet (40 mg total) by mouth daily. ? polyethylene glycol (MIRALAX) 17 gram packet Take 1 packet (17 g total) by mouth daily as needed for constipation. Mix in 8 ounces of water, juice, soda, coffee or tea prior to taking. ? [START ON 08/09/2020] predniSONE (DELTASONE) 10 mg tablet Take 3 tablets (30 mg total) by mouth daily for 7 days, THEN 2 tablets (20 mg total) daily for 7 days, THEN 1 tablet (10 mg total) daily for 7 days, THEN 0.5 tablets (5 mg total) daily for 7 days, THEN 0.5 tablets (5 mg total) daily for 7 days. Take with food.. ? predniSONE (DELTASONE) 10 mg tablet Take 13 tablets (130 mg total) by mouth daily for 7 days, THEN 12 tablets (120 mg total) daily for 7 days, THEN 11 tablets (110 mg total) daily for 7 days, THEN 10 tablets (100 mg total) daily for 7 days, THEN 9 tablets (90 mg total) daily for 7 days, THEN 8 tablets (80 mg total) daily for 7 days, THEN 7 tablets (70 mg total) daily for 7 days, THEN 6 tablets (60 mg total) daily for 7 days, THEN 5 tablets (50 mg total) daily for 7 days, THEN 4 tablets (40 mg total) daily for 7 days. Take with food.. ? senna-docusate (SENNA-PLUS) 8.6-50 mg tablet Take 1 tablet by mouth nightly. (Patient not taking: Reported on 05/27/2020) ? simethicone (MYLICON) 80 mg chewable tablet Take 1 tablet (80 mg total) by mouth every 6 (six) hours as needed for flatulence. (Patient not taking: Reported on 05/27/2020) ? sulfamethoxazole-trimethoprim (BACTRIM DS;CO-TRIMOXAZOLE DS) 800-160 mg per tablet Take 1 tablet by mouth Every Monday, Wednesday, and Friday. ? tamsulosin (FLOMAX) 0.4 mg 24 hr capsule Take 1 capsule (0.4 mg total) by mouth nightly. ? tiotropium (SPIRIVA WITH HANDIHALER) 18 mcg capsule for inhalation Inhale 18 mcg into the lungs daily. ? walker Misc Use as directed. No current facility-administered medications for this visit. Exam: ECOG Performance Status: 1Vitals: Wt: 05/27/20 (!) 138.3 kg 05/21/20 (!) 139 kg 05/20/20 (!) 140.9 kg  05/19/20 136.1 kg 05/16/20 136.1 kg 05/12/20 (!) 138.3 kg Physical ExamVitals reviewed. Constitutional:     General: He is not in acute distress.   Appearance: Normal appearance. He is obese. He is not ill-appearing or toxic-appearing. Cardiovascular:    Rate and Rhythm: Normal rate and regular rhythm.    Heart sounds: No murmur heard. Pulmonary:    Effort: Pulmonary effort is normal. No respiratory distress.    Breath sounds: Wheezing present. Abdominal:    Palpations: Abdomen is soft. There is no mass.    Tenderness: There is no abdominal tenderness. There is no guarding or rebound. Musculoskeletal:       General: Swelling present.    Cervical back: Normal range of motion.    Right lower leg: Edema present.    Left lower leg: Edema present. Neurological:    General: No focal deficit present.    Mental Status: He is alert. Mental status is at baseline. Laboratory Data: Results for orders placed or performed in visit on 06/02/20 CBC auto differential Result Value Ref Range  WBC 25.3 (H) 4.0 - 10.0 x1000/?L  RBC 3.3 (L) 3.8 - 5.9 M/?L  Hemoglobin 10.5 (L) 12.0 - 18.0 g/dL  Hematocrit 16.1 (L) 09.6 - 52.0 %  MCV 94.9 (H) 78.0 - 94.0 fL  MCHC 33.3 31.0 - 36.0 g/dL  RDW-CV 04.5 (H) 40.9 - 14.5 %  Platelets 249 140 - 440 x1000/?L  MPV 10.0 6.0 - 11.0 fL  nRBC 0.0 0.0 - 1.0 %  Absolute nRBC 0.0 0.0 - 0.0 x 1000/?L  MCH 31.6 (H) 27.0 - 31.0 pg  Diagnostic Imaging: XR Chest PA or APResult Date: 10/1/2021Multiple bilateral variable sized pulmonary nodules. No acute radiographic findings. Reported And Signed By: Maudie Flakes, MD  [facility] Radiology and Biomedical ImagingCT Abdomen Pelvis w IV ContrastResult Date: 10/2/20211.  Bladder wall thickening at the base with nonvisualization of the previously seen bladder tumor consistent with interval resection. 2.  Slightly decreased soft tissue at the ureteropelvic junction likely reflecting interval biopsy/resection of the known tumor. Unchanged mild left hydronephrosis with left renal atrophy. 3.  No change in retroperitoneal lymph nodes. 4.  Redemonstration of metastatic lung disease. Reported And Signed By: Levonne Spiller, MD  [facility] Radiology and Biomedical ImagingImpression: Paul Hunter is a 69 y.o. man with a 75py smoking history, history of CAD s/p MIx2 and PCI, diagnosed with stage IV NSCLC in the fall of 2020. ?He was started on treatment with Carbo/Taxol/Pembrolizumab on 12/25/19 in [place] where he received?2 cycles. He tolerated treatment?relatively well overall, but developed some neuropathy following?the?second cycle. His care was then transitioned to [place].?Taxol was dose reduced by 20% starting with cycle 3 due to neuropathy. He completed cycle 4 on 7/15. Restaging imaging on 7/31 showed stable/improving disease, including improvements in his RP LAD. 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  Trop and BNP normal. Started on prednisone 130mg  daily. PET stress with findings suspicious for multi-vessel CAD, resting EF 40%-->stress 36%.  He was therefore started on DAPT, but developed recurrent gross hematuria requiring admissions. Cystoscopy with TURBT on 05/22/2020 revealed a bladder mass, with path consistent with urothelial carcinoma.In further discussions with Pathology and re-review of his outside slides, his initial mediastinal node biopsy appears poorly differentiated and p63+. While initially interpreted as metastatic SCC of presumed  pulmonary origin, the morphological similarities to most recent bladder mass biopsy make it likely he has had metastatic urothelial carcinoma (rather than lung cancer), which can also be p63+. We had reviewed his case with our GU Med Onc colleagues, and will plan to transition further oncologic care to their clinic to discuss further systemic therapy options. Plan: #Metastatic urothelial carcinoma:- Referral placed to GU Onc, case discussed with Dr. Erin Sons- Will tentatively plan 2 week phone follow-up while transitioning care- No further lung cancer-directed therapy indicated at this time- Patient has upcoming appointment with [facility]#ICI-induced myocarditis:- Repeat troponin and BNP next week- Continue prednisone taper, as outlined by Cardi-Oncology  -- Will discuss potentially a faster taper given significant steroid-induced side effects- Continue PPI and Bactrim prophylaxis#Smoking cessation:- Continue nicotine 7mg  patch, which he found helpful during recent admission- Prescribed nicotine lozenges PRN- Will have Smoking Cessation Team follow-up with Paul Hunter, who is motivated to quit#Chronic lumbago 2/2 spinal stenosis (not cancer related): Had previously been receiving CSI q52mo prior to moving to [place]- Prev referred to [facility] - patient will touchbase with clinic to reschedule consult appointment (missed due to admission)- Uptitrate gabapentin to 900mg  qhs, per patient preference#Supportive Care:- Appreciate Case Management assistance in arranging visiting RN to help with medication management- Appreciate SW involvement, as patient is feeling overwhelmed by overall situationSeen and discussed with attending, Dr. Tyreka Henneke.___________________Benjamin Jeannie Fend. Lu, MDClinical Fellow, PGY-6Division of Medical OncologySmilow Cancer HospitalAttending Statement I have seen/evaluated the patient and agree with the H&P as documented by the Fellow Dr. Reynolds Bowl.  Paul Hunter was recently admitted for recurrent hematuria, and cytoscopy revealed a bladder mass which was biopsied and showed invasive papillary urothelial carcinoma.  He also had a biopsy of a right lower lobe lung mass which was positive for malignant cells morphologically similar to the bladder tumor.  I reviewed his case with the pathologist who re-reviewed the original mediastinal lymph node biopsy, and that too appears to be consistent with urothelial cancer.  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  I reviewed this with the patient and his sister-in-law and recommended that he see a GU medical oncologist to discuss additional options for treatment.  He will continue on the steroid taper for myocarditis - we discussed with cardio-onc whether we can expedite the taper but the preferred keeping it as is given persistently elevated troponin.  Clinically he has improved with less dyspnea and improvement in volume status after diuresis.  We will repeat troponin and BNP next week to follow closely.  Will refer him back to ortho spine for chronic back pain for which he previously received steroid injections with good benefit.  I will plan to see him back in 2 weeks for followup as he transitions his care to the GU team.Lizzett Nobile B Layla Barter

## 2020-06-10 ENCOUNTER — Encounter: Admit: 2020-06-10 | Payer: PRIVATE HEALTH INSURANCE

## 2020-06-10 ENCOUNTER — Ambulatory Visit: Admit: 2020-06-10 | Payer: PRIVATE HEALTH INSURANCE

## 2020-06-12 ENCOUNTER — Encounter: Admit: 2020-06-12 | Payer: PRIVATE HEALTH INSURANCE | Attending: Medical Oncology

## 2020-06-12 DIAGNOSIS — I2510 Atherosclerotic heart disease of native coronary artery without angina pectoris: Secondary | ICD-10-CM

## 2020-06-12 DIAGNOSIS — K219 Gastro-esophageal reflux disease without esophagitis: Secondary | ICD-10-CM

## 2020-06-12 DIAGNOSIS — I10 Essential (primary) hypertension: Secondary | ICD-10-CM

## 2020-06-12 DIAGNOSIS — E291 Testicular hypofunction: Secondary | ICD-10-CM

## 2020-06-12 DIAGNOSIS — Z720 Tobacco use: Secondary | ICD-10-CM

## 2020-06-12 DIAGNOSIS — E785 Hyperlipidemia, unspecified: Secondary | ICD-10-CM

## 2020-06-12 DIAGNOSIS — E559 Vitamin D deficiency, unspecified: Secondary | ICD-10-CM

## 2020-06-12 DIAGNOSIS — T884XXA Failed or difficult intubation, initial encounter: Secondary | ICD-10-CM

## 2020-06-12 DIAGNOSIS — F419 Anxiety disorder, unspecified: Secondary | ICD-10-CM

## 2020-06-12 DIAGNOSIS — E119 Type 2 diabetes mellitus without complications: Secondary | ICD-10-CM

## 2020-06-12 DIAGNOSIS — N400 Enlarged prostate without lower urinary tract symptoms: Secondary | ICD-10-CM

## 2020-06-12 DIAGNOSIS — M4800 Spinal stenosis, site unspecified: Secondary | ICD-10-CM

## 2020-06-13 ENCOUNTER — Telehealth: Admit: 2020-06-13 | Payer: PRIVATE HEALTH INSURANCE | Attending: Medical Oncology

## 2020-06-13 ENCOUNTER — Ambulatory Visit: Admit: 2020-06-13 | Payer: PRIVATE HEALTH INSURANCE | Attending: Pain Medicine

## 2020-06-13 ENCOUNTER — Telehealth: Admit: 2020-06-13 | Payer: PRIVATE HEALTH INSURANCE | Attending: Hematology

## 2020-06-13 ENCOUNTER — Encounter: Admit: 2020-06-13 | Payer: PRIVATE HEALTH INSURANCE | Attending: Pain Medicine

## 2020-06-13 DIAGNOSIS — R202 Paresthesia of skin: Secondary | ICD-10-CM

## 2020-06-13 DIAGNOSIS — M47816 Spondylosis without myelopathy or radiculopathy, lumbar region: Secondary | ICD-10-CM

## 2020-06-13 DIAGNOSIS — N400 Enlarged prostate without lower urinary tract symptoms: Secondary | ICD-10-CM

## 2020-06-13 DIAGNOSIS — Z720 Tobacco use: Secondary | ICD-10-CM

## 2020-06-13 DIAGNOSIS — K219 Gastro-esophageal reflux disease without esophagitis: Secondary | ICD-10-CM

## 2020-06-13 DIAGNOSIS — M4800 Spinal stenosis, site unspecified: Secondary | ICD-10-CM

## 2020-06-13 DIAGNOSIS — M5442 Lumbago with sciatica, left side: Secondary | ICD-10-CM

## 2020-06-13 DIAGNOSIS — E291 Testicular hypofunction: Secondary | ICD-10-CM

## 2020-06-13 DIAGNOSIS — F419 Anxiety disorder, unspecified: Secondary | ICD-10-CM

## 2020-06-13 DIAGNOSIS — E785 Hyperlipidemia, unspecified: Secondary | ICD-10-CM

## 2020-06-13 DIAGNOSIS — E559 Vitamin D deficiency, unspecified: Secondary | ICD-10-CM

## 2020-06-13 DIAGNOSIS — E119 Type 2 diabetes mellitus without complications: Secondary | ICD-10-CM

## 2020-06-13 DIAGNOSIS — M5416 Radiculopathy, lumbar region: Secondary | ICD-10-CM

## 2020-06-13 DIAGNOSIS — I2510 Atherosclerotic heart disease of native coronary artery without angina pectoris: Secondary | ICD-10-CM

## 2020-06-13 DIAGNOSIS — M7918 Myalgia, other site: Secondary | ICD-10-CM

## 2020-06-13 DIAGNOSIS — I10 Essential (primary) hypertension: Secondary | ICD-10-CM

## 2020-06-13 DIAGNOSIS — T884XXA Failed or difficult intubation, initial encounter: Secondary | ICD-10-CM

## 2020-06-13 NOTE — Progress Notes
NAME: Paul Hunter (08-Oct-1950)MRN: [id number] AGE: 69 y.o.PROVIDER: Nadara Mustard OF SERVICE: 10/14/2021INITIAL CONSULT NOTECHIEF COMPLAINT: metastatic bladder cancerHPI: Oncology History Overview Note 69 y.o. 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 Found to have microscopic hematuria 11/2017 f/u with urology, imaging not concerning for pathology (we do not have it), tx for UTI05/2019 Presents to ER for kidney stone02/2021 worsening SOB over many mos02/2021 CXR: bilateral pulmonary nodules02/2021 [place] chest: multifocal adenopathy and pulmonary nodules.  Findings almost certainly represent metastatic disease03/24/2021 lung biopsy shows metastatic cancer03/31/2021 lung biopsy: [facility], [address], [address], [place], [place], [id number], 3 SLIDES, SPECIMEN COLLECTION DATE, 11/25/2019:   EBUS FNA, 4R, LYMPH NODE AND PDL1 SLIDES:      - PER OUTSIDE PHENOPATH REPORT ([id number])      - INSUFFICIENT VIABLE TUMOR CELLS PRESENT       - CELL BLOCK WITH NO DEFINITIVE VIABLE TUMOR CELLS.10/2019 PET/[place]: Adenopathy in neck, chest and upper abdomen avidly positive.  Bilateral pulmonary nodules are active, c/w neoplasm05/2021 MRI brain: No evidence of metastatic disease.  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 He had bedside irrigation and given urine was clear red without clots and Hgb and Cr at baseline, no operative intervention was recommended but planned to have cystoscopy as outpatient and Cardiology recommended holding clopidogrel until outpatient f/u on 10/8/202109/26/2021 cystoscopy w/ clot evacuation, TURBT, L retrograde pyelogram, and noted 3x4cm papillary tumor involving L trigone and L lateral bladder wall. Pathology: BLADDER, LEFT LATERAL WALL AND TRIGONE, TRANSURETHRAL RESECTION:      - INVASIVE PAPILLARY UROTHELIAL CARCINOMA, HIGH GRADE      - TUMOR INVADES INTO LAMINA PROPRIA      - MUSCULARIS PROPRIA IDENTIFIED BUT NOT INVOLVED BY TUMOR 2. LEFT URETER TUMOR, BIOPSY:      - SUSPICIOUS FOR HIGH GRADE UROTHELIAL CARCINOMA09/27/2021 repeat cystoscopy for gross hematuria that developed after 05/22/2020 cystoscopy and biopsy. He had clot evacuation and fulguration; found to have large organized clot w/in bladder and arterial bleeding immediately lateral to L ureteral orifice s/p cauterization w/ subsequent improvement in hematuria09/28/2021 EBUSLUNG, RIGHT LOWER, FINE NEEDLE ASPIRATION/WASH: - SPECIMEN IS ADEQUATE FOR INTERPRETATION. - POSITIVE FOR MALIGNANT CELLS. - CONSISTENT WITH METASTASIS. - SEE NOTE. Note: Tumor cells are positive for p40 and Gata-3. They are negative for TTF-1. A clinical history of multiple lung nodules is noted. The morphologic features of this tumor are similar to the bladder biopsy ([id number]), reviewed for comparison purposes only. Based on the overall features metastatic carcinoma from known urothelial primary is favored. Clinical and imaging correlation is recommended10/01-11/2019 admitted to [facility] for presyncopal episode10/09/2019 [place] AP: 1.  Bladder wall thickening at base with nonvisualization of previously seen bladder tumor consistent with interval resection. 2.  Slightly decreased soft tissue at ureteropelvic junction likely reflecting interval biopsy/resection of known tumor. Unchanged mild  left hydronephrosis with left renal atrophy. 3.  No change in retroperitoneal lymph nodes. 4.  Redemonstration of metastatic lung disease.05/29/2020 Echo: EF 66%Genetic testing on one of the lung nodule biopsies was done - NTRK1 mutation but insufficient for additional studies Bladder cancer (HC Code) (HC CODE) [name] presents with the history above. He has a number of issues:1. He was originally diagnosed with lung cancer but over the last few weeks it has become apparent that he has a poorly differentiated bladder cancer. 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  Since starting steroids, he has had a lot of swelling and jitteriness. Taking steroids bid helps with jitteriness.2. Appetite is good.3. He feels like he is not fully getting all urine out. 5 days ago a lot of clots came out and he felt better. Then a second episode. Since then no bleeding. Having pain in head of penis. But that is slight (burning). Wearing pads. 4. Biggest pain is on left side in front (LLQ). Starts within 30 minutes of waking up. Spasmodic pain. Pain has gotten worse and it lasts for hours. He takes oxycodone 2x/day and tramadol with morning meds. 5. Easily fatigued. Sits around a lot but he is awake pretty much all day. 6. 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 7. He has fallen more than once. Since then he has stopped drivingECOG PS: 2 PMH: Patient Active Problem List  Diagnosis Date Noted ? Chest pain, rule out acute myocardial infarction 05/27/2020 ? Mediastinal lymphadenopathy 05/24/2020 ? Bladder cancer (HC Code) (HC CODE) 05/22/2020 ? Hemorrhagic cystitis 05/21/2020 ? Urinary catheter complication, initial encounter (HC Code) (HC CODE) 05/20/2020 ? DOE (dyspnea on exertion) 05/09/2020 ? Pulmonary emphysema, unspecified emphysema type (HC Code) (HC CODE) 03/28/2020 ? Dyspnea on exertion 03/28/2020 ? Sleep apnea, unspecified type 03/28/2020 ? Squamous cell carcinoma of lung, stage IV (HC Code) (HC CODE) 01/07/2020 ? GERD (gastroesophageal reflux disease) 08/14/2011 ? CAD (coronary artery disease)  ? Diabetes mellitus (HC Code) (HC CODE)  ? Hyperlipidemia  ? Hypertension  PSH: Past Surgical History: Procedure Laterality Date ? CAROTID STENT  08/28/2003 ? CHOLECYSTECTOMY  11/07/2016 ? KNEE SURGERY    torn meniscus ? LAMINECTOMY  10/07/2008 ? LUMBAR DISC SURGERY    herniated disc SocH: Social History Social History Narrative  As of 2021: Was living?in [place]?and moved back to [place] to be near family in 2021. He moved into his niece's in-law apartment in [place] while undergoing cancer treatment. ?He is currently self sufficient and independent.  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  -[employer] coming for med management and HHA assistance  -family very involved  -he is divorced and has 1 daughter with a 46 month old who lives 30 minutes away and a son with a 7 month old in [place]    Smoking hx 29 PYH  -current 1 PPD  -meeting with smoking cessation  EtOH - not much   FamH: no significant family historyALLERGIES: No Known AllergiesCURRENT MEDICATIONS: Current Outpatient Medications on File Prior to Visit Medication Sig Dispense Refill ? albuterol sulfate 90 mcg/actuation HFA aerosol inhaler Inhale 2 puffs into the lungs every 6 (six) hours as needed for wheezing or shortness of breath. 6.7 g 0 ? aspirin 81 MG EC tablet Take 81 mg by mouth daily.   ? atorvastatin (LIPITOR) 80 mg tablet Take 1 tablet (80 mg total) by mouth nightly.  30 tablet 6 ? belladonna alkaloids-opium (B&O SUPPRETTES) 16.2-30 mg suppository Place 1 suppository rectally 2 (two) times daily as needed for bladder spasm. 12 suppository 0 ? calcium carbonate-vitamin D3 500 mg(1,250mg ) -400 unit per tablet Take 1 tablet by mouth 2 (two) times daily. (Patient not taking: Reported on 06/02/2020) 60 tablet 2 ? carboxymethylcellulose (REFRESH PLUS) 0.5 % ophthalmic solution in dropperette Place 1 drop into both eyes 3 (three) times daily as needed.   ? escitalopram oxalate (LEXAPRO) 10 mg tablet Take 1 tablet (10 mg total) by mouth nightly. 90 tablet 3 ? fexofenadine (ALLEGRA) 180 mg tablet Take 180 mg by mouth daily as needed.    ? furosemide (LASIX) 40 mg tablet Take 0.5 tablets (20 mg total) by mouth daily. 30 tablet 2 ? gabapentin (NEURONTIN) 300 mg capsule Take 2 capsules (600 mg total) by mouth nightly. 60 capsule 0 ? lisinopriL (PRINIVIL,ZESTRIL) 2.5 mg tablet Take 2.5 mg by mouth daily.   ? LORazepam (ATIVAN) 0.5 mg tablet Take 1 tablet (0.5 mg total) by mouth every 6 (six) hours as needed for anxiety. (Patient not taking: Reported on 06/02/2020) 15 tablet 0 ? metoprolol succinate XL (TOPROL-XL) 25 mg 24 hr tablet Take 0.5 tablets (12.5 mg total) by mouth daily. Take with or immediately following a meal. 30 tablet 0 ? nicotine polacrilex (COMMIT) 2 mg lozenge Place 1 lozenge (2 mg total) inside cheek every 2 (two) hours as needed for smoking cessation. 108 each 2 ? oxybutynin XL (DITROPAN-XL) 5 mg 24 hr tablet Take 1 tablet (5 mg total) by mouth daily. 90 tablet 0 ? oxyCODONE (ROXICODONE) 5 mg Immediate Release tablet Take 1 tablet (5 mg total) by mouth every 4 (four) hours as needed for up to 10 doses. 60 tablet 0 ? pantoprazole (PROTONIX) 40 mg tablet Take 1 tablet (40 mg total) by mouth daily. 30 tablet 2 ? polyethylene glycol (MIRALAX) 17 gram packet Take 1 packet (17 g total) by mouth daily as needed for constipation. Mix in 8 ounces of water, juice, soda, coffee or tea prior to taking. 14 each 0 ? [START ON 08/09/2020] predniSONE (DELTASONE) 10 mg tablet Take 3 tablets (30 mg total) by mouth daily for 7 days, THEN 2 tablets (20 mg total) daily for 7 days, THEN 1 tablet (10 mg total) daily for 7 days, THEN 0.5 tablets (5 mg total) daily for 7 days, THEN 0.5 tablets (5 mg total) daily for 7 days. Take with food.. 49 tablet 0 ? predniSONE (DELTASONE) 10 mg tablet Take 13 tablets (130 mg total) by mouth daily for 7 days, THEN 12 tablets (120 mg total) daily for 7 days, THEN 11 tablets (110 mg total) daily for 7 days, THEN 10 tablets (100 mg total) daily for 7 days, THEN 9 tablets (90 mg total) daily for 7 days, THEN 8 tablets (80 mg total) daily for 7 days, THEN 7 tablets (70 mg total) daily for 7 days, THEN 6 tablets (60 mg total) daily for 7 days, THEN 5 tablets (50 mg total) daily for 7 days, THEN 4 tablets (40 mg total) daily for 7 days. Take with food.. 595 tablet 0 ? predniSONE (DELTASONE) 50 mg tablet Take with food. 56 tablet 0 ? senna-docusate (SENNA-PLUS) 8.6-50 mg tablet Take 1 tablet by mouth nightly. 30 tablet 0 ? simethicone (MYLICON) 80 mg chewable tablet Take 1 tablet (80 mg total) by mouth every 6 (six) hours as needed for flatulence. 30 tablet 0 ? sulfamethoxazole-trimethoprim (BACTRIM DS;CO-TRIMOXAZOLE DS) 800-160 mg per  tablet Take 1 tablet by mouth Every Monday, Wednesday, and Friday. 28 tablet 0 ? tamsulosin (FLOMAX) 0.4 mg 24 hr capsule Take 1 capsule (0.4 mg total) by mouth nightly. 30 capsule 2 ? traMADoL (ULTRAM) 50 mg tablet Take 1 tablet (50 mg total) by mouth every 8 (eight) hours as needed for up to 7 days. 20 tablet 0 ? walker Misc Use as directed. 1 each 0 ? [DISCONTINUED] nicotine (NICODERM CQ) 7 mg transdermal patch Place 1 patch (7 mg total) onto the skin daily. Alternate arms/sites when applying patch. Discard old patch when applying new one 28 patch 0 No current facility-administered medications on file prior to visit. ROSThere were no vitals filed for this visit.Physical Exam LABORATORY DATA: RADIOLOGIC STUDIES: above in HPICODE STATUS: not discussedASSESSMENT/PLAN: 69 y.o. man with HTN, T2 DM, CAD (PCI x3 in 2014), NSTEMI, COPD, CHF with metastatic bladder cancer. He has had hematuria since mid-2018. In 09/2019 he developed shortness of breath and work-up revealed multiple bilateral pulmonary nodules. 2 biopsies showed cancer but definitive tissue diagnosis was lacking. From 4-02/2020 he received 4 cycles of carboplatin/paclitaxel/pembrolizumab on the assumption that he had lung cancer. 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 Lowering the prednisone dose resulted in evidence of worsening myositis so prednisone has stayed at high dose. Scans in 04/2020 show progression of disease in lungs and minimal progression in the abdomen and pelvis. In 04/2020 he was admitted to [facility] with urinary retention, penile pain and poor foley drainage. Work-up eventually revealed a bladder mass and biopsy was consistent with bladder cancer and an EBUS on 05/24/2020 found cells that were similar to the bladder mass. Thus, he has metastatic bladder cancer.At this point he has a number of issues:1. High dose steroids with swelling, weight gain and difficulty with mobility2. Cardiomyopathy3. Urinary issues4. LLQ and penile pain5. Currently untreated cancerPlan:1. Discuss with cardiology whether he could go on a steroid sparing agent to decrease steroids2. His urinary issues should improve a bit on their own3. Start enfortumab vedotin in 2 weeks4. For pain - add long acting oxycodone Rosalyn Gess Hurwitz10/14/2021 12:11 PM

## 2020-06-13 NOTE — Telephone Encounter
noted 

## 2020-06-13 NOTE — Telephone Encounter
Pt is being [facility] & [facility] needs to go over RX listProvider is Dr Lehman Prom

## 2020-06-13 NOTE — Telephone Encounter
Spoke with Paul Hunter [facility] RN.She reached me in error after holding for a significant amount of time. Reviewed med list.She wants to report an interaction between Lisinopril and Bactrim DS.Routing to appropriate pool.

## 2020-06-13 NOTE — Progress Notes
[facility] Rehabilitation[facility] Rehabilitation [address], [facility], [place] Appointment scheduling: [phone] , Fax: [phone] Today's Date: 10/18/2021Patient name: Paul Pettingill AstorinoMRN: [id number] Referring Provider: Nikki Dom, MDNew Patient Impression & Plan: IMPRESSION:69 y.o. pleasant male (retired 2019, Glass blower/designer) with hx as below presenting with below complaint(s):Imaging: MRI L-Spine (12/2008, 05/2018, no images) L4-5/L5-S1 small left paracentral protrusion (almost same as 2010)[place] L-Spine (06/2018) L2-3/L3-4 moderate central canal stenosis (in standing)1. LBP & BLE paresthesia (predominant, anterior/medial thigh).  LBP since 2010 s/p laminectomy. BLE since summer 2021-likely lumbar radiculitis/myofascial pain in the setting of post-laminectomy syndrome.  Chemo related peripheral neuropathy versus tumor infiltration remains in the differential.2. Bilateral digits/distal feet paresthesia since summer 2021-likely PN related to chemoPertinent treatments thus far: L4-5 laminectomy 2010Right L3 TFESI (07/2018, [facility]) - 100% relief for 3 months with back painLeft L3-4 TFESI (10/2018, [facility]) - 100% relief for 3 months with back painPLAN:Diagnostics:  Ordered MRI L-spine without contrast.  No further imaging needed at this time. Went over new imaging in detail with patient. However if no improvement would consider EMG BLE versus x-ray L-spine flexion extension.  Advised patient obtain MRI L-spine CD from 2019 if possible.Consultation(s): None at this time. Educated red flags.  Counseled smoking cessation-2 ppdTherapy:  Continue home therapy focusing on core/hip/10 on the conditioning, ROM, stretching and strengthening, and education on home exercise program. Stressed the importance of continuing home exercise program at least 4 days out of the week. Discussed life style changes and postural modifications in detail. Alternative medicine/complimentary modalities to be considered in the future-acupuncture. Meds: Patient on gabapentin 600 qhs, OxyContin b.i.d (since summer 2021), tramadol (2- 3x/day, since summer 2021), prednisone (on and off), Lexapro. Ordered none. Future consideration:  Medical cannabis. Discussed side effect profile for all medications and patient expressed understanding.   Pertinent facts: As below.  Injections: None at this time. Future consideration:  BLE burning-bilateral L4 transforaminal epidural steroid injection.  LBP-trigger point without steroids versus facet injections. Discussed in detail risks and benefits of the procedure and patient expressed understanding. Pertinent facts: BMI 42, MI (stent 2005, ASA81/plavix), DM (HgA1c 5.6 03/2020), Smoker, lumbar laminectomy (09/2008), Lung CAwith mets (chemo finished 03/2020, no RT), Bladder CA(sx 04/2020, chemo pending), CKD (Cr 1.51 05/2020), Current abx (bactrim for prophylaxis), Kidney stones,  hypotension.  Will need clearance from Oncology/cardiology prior to ESIP&O: None at this time.  Using cane/walker since summer 2021Follow up: No follow-ups on file.  Will assist as much as possible.  Call after discussing with Heme Onc regarding chemo, otherwise TPThank you Dr. Layla Barter for allowing me to care for this patient.  History of Present Illness CC:  BLE burningHPI: Patient complains of painLocation:  Across low back-currently not bothersome.  Burning sensation to bilateral anterior medial thigh-most bothersome.  Occasionally to lateral thigh as well.  Low back usually much worse than the burning sensation in the legs but currently burning sensations much worse.  Onset:  2010. On and off since then.  Flare-up x2 to 3 years.  BLE burning since summer 2021 after starting chemoRelated trauma:  None Numbness/Tingling:  Numbness to bilateral anterior thigh Aggravating factors:  Walking Alleviating factors:  Sitting -burning almost goes away, prednisone helped low back significantly Other pertinent hx: Burning sensation in the legs has been affecting his quality of life significantly.  He would like to get some pain relief.  He continues to be very frustrated by his progression of disease.No other areas of painCurrent Pain Meds: Gabapentin 600mg  qhsOxycodone bid Tramadol 1-2x/dayPrednisone 110mg  - helping ?Past Pain Meds: Not sure?Other medsAspirin 81LexaproBactrimPlavixInjections in the past:Lumbar spine injection x5 (last one 2020, [place])-significantly helped his back and leg pain for 3 months every time.  Could not repeat injections because of chemotherapyPhysical Therapy -  last session - 3 years ago.  HEP - none.  Mostly sedentary at home.  Able to do toileting by himself.  Unable to shower.  Has a nurse come in to help with shower.  Able to dress by himself.Chiropractor - NoneAcupuncture - NonePertinent Surgeries: NoneB/B issues:  - urine incontinent - wearing pad for 1.5 month.  Positive constipation on and offFevers:  None - bactrim for prevention.  No current infections Unintentional weight loss:   noneDepression/Anxiety:   noneCP/SOB:  NoneWeakness:  ble Other symptoms:  None Social Hx: Denies drinking/drugs.  +Smoking - 2 packs a day Children: 2-daughter lives 1/2 hour awayCurrently living with this niece, her boyfriend and her kids.  There is always someone at home.He came back from [place] to be close to family in summer 2021 once he was diagnosed with cancer.  Originally from .Occupation:  Worked for [employer] and multiple other companies in the past.  Retired few years ago.Family history:  as below Other pertinent hx:  here with daughter.  Cancer has been taking a huge toll  on his life.  He would like to get some relief.  Lung cancer diagnosis in March 2021- he has been having a significant difficult time coping with it since then.Medical, Family, Social History: Past Medical History: Diagnosis Date ? Anxiety  ? CAD (coronary artery disease)  ? Diabetes mellitus (HC Code) (HC CODE)  ? Difficult intubation 05/22/2020  Patient with significant soft tissue in oropharynx. Glidescope intubation revealed anterior cords with floppy soft tissue obstruction. Patient will require video laryngoscopy for airway management going forward. ? GERD (gastroesophageal reflux disease)  ? Hyperlipidemia  ? Hypertension  ? Hypertrophy of prostate without urinary obstruction and other lower urinary tract symptoms (LUTS)  ? Other testicular hypofunction  ? Spinal stenosis 09/28/2018 ? Tobacco abuse  ? Vitamin D deficiency  Past Surgical History: Procedure Laterality Date ? CAROTID STENT  08/28/2003 ? CHOLECYSTECTOMY  11/07/2016 ? KNEE SURGERY    torn meniscus ? LAMINECTOMY  10/07/2008 ? LUMBAR DISC SURGERY    herniated disc No family history on file.Social History Occupational History ? Not on file Tobacco Use ? Smoking status: Current Every Day Smoker   Types: Cigarettes Substance and Sexual Activity ? Alcohol use: Not on file ? Drug use: Not on file ? Sexual activity: Not on file Social History Substance and  Sexual Activity Drug Use Not on file Allergies: No Known AllergiesOutpatient Encounter Medications as of 06/13/2020 Medication Sig Dispense Refill ? albuterol sulfate 90 mcg/actuation HFA aerosol inhaler Inhale 2 puffs into the lungs every 6 (six) hours as needed for wheezing or shortness of breath. 6.7 g 0 ? aspirin 81 MG EC tablet Take 81 mg by mouth daily.   ? atorvastatin (LIPITOR) 80 mg tablet Take 1 tablet (80 mg total) by mouth nightly. 30 tablet 6 ? calcium carbonate-vitamin D3 500 mg(1,250mg ) -400 unit per tablet Take 1 tablet by mouth 2 (two) times daily. 60 tablet 2 ? carboxymethylcellulose (REFRESH PLUS) 0.5 % ophthalmic solution in dropperette Place 1 drop into both eyes 3 (three) times daily as needed.   ? escitalopram oxalate (LEXAPRO) 10 mg tablet Take 1 tablet (10 mg total) by mouth nightly. 90 tablet 3 ? fexofenadine (ALLEGRA) 180 mg tablet Take 180 mg by mouth daily as needed.    ? furosemide (LASIX) 40 mg tablet Take 0.5 tablets (20 mg total) by mouth daily. 30 tablet 2 ? gabapentin (NEURONTIN) 300 mg capsule Take 2 capsules (600 mg total) by mouth nightly. 60 capsule 0 ? lisinopriL (PRINIVIL,ZESTRIL) 2.5 mg tablet Take 2.5 mg by mouth daily.   ? LORazepam (ATIVAN) 0.5 mg tablet Take 1 tablet (0.5 mg total) by mouth every 6 (six) hours as needed for anxiety. 15 tablet 0 ? metoprolol succinate XL (TOPROL-XL) 25 mg 24 hr tablet Take 0.5 tablets (12.5 mg total) by mouth daily. Take with or immediately following a meal. 30 tablet 0 ? nicotine (NICODERM CQ) 21 mg transdermal patch APPLY 1 PATCH DAILY. ROTATE PATCH SITES. LEAVE ON FOR 24 HOURS UNLESS OTHERWISE DIRECTED.   ? oxybutynin XL (DITROPAN-XL) 5 mg 24 hr tablet Take 1 tablet (5 mg total) by mouth daily. 90 tablet 0 ? oxyCODONE (OXYCONTIN) 10 mg 12 hr extended release tablet Take 1 tablet (10 mg total) by mouth every 12 (twelve) hours. 20 tablet 0 ? oxyCODONE (ROXICODONE) 5 mg Immediate Release tablet Take 1 tablet (5 mg total) by mouth every 4 (four) hours as needed for up to 10 doses. 60 tablet 0 ? pantoprazole (PROTONIX) 40 mg tablet Take 1 tablet (40 mg total) by mouth daily. 30 tablet 2 ? polyethylene glycol (MIRALAX) 17 gram packet Take 1 packet (17 g total) by mouth daily as needed for constipation. Mix in 8 ounces of water, juice, soda, coffee or tea prior to taking. 14 each 0 ? [START ON 08/09/2020] predniSONE (DELTASONE) 10 mg tablet Take 3 tablets (30 mg total) by mouth daily for 7 days, THEN 2 tablets (20 mg total) daily for 7 days, THEN 1 tablet (10 mg total) daily for 7 days, THEN 0.5 tablets (5 mg total) daily for 7 days, THEN 0.5 tablets (5 mg total) daily for 7 days. Take with food.. 49 tablet 0 ? predniSONE (DELTASONE) 10 mg tablet Take 13 tablets (130 mg total) by mouth daily for 7 days, THEN 12 tablets (120 mg total) daily for 7 days, THEN 11 tablets (110 mg total) daily for 7 days, THEN 10 tablets (100 mg total) daily for 7 days, THEN 9 tablets (90 mg total) daily for 7 days, THEN 8 tablets (80 mg total) daily for 7 days, THEN 7 tablets (70 mg total) daily for 7 days, THEN 6 tablets (60 mg total) daily for 7 days, THEN 5 tablets (50 mg total) daily for 7 days, THEN 4 tablets (40 mg total) daily for 7 days. Take with  food.. 595 tablet 0 ? predniSONE (DELTASONE) 50 mg tablet Take with food. 56 tablet 0 ? senna-docusate (SENNA-PLUS) 8.6-50 mg tablet Take 1 tablet by mouth nightly. 30 tablet 0 ? simethicone (MYLICON) 80 mg chewable tablet Take 1 tablet (80 mg total) by mouth every 6 (six) hours as needed for flatulence. 30 tablet 0 ? sulfamethoxazole-trimethoprim (BACTRIM DS;CO-TRIMOXAZOLE DS) 800-160 mg per tablet Take 1 tablet by mouth Every Monday, Wednesday, and Friday. 28 tablet 0 ? tamsulosin (FLOMAX) 0.4 mg 24 hr capsule Take 1 capsule (0.4 mg total) by mouth nightly. 30 capsule 2 ? walker Misc Use as directed. 1 each 0 ? belladonna alkaloids-opium (B&O SUPPRETTES) 16.2-30 mg suppository Place 1 suppository rectally 2 (two) times daily as needed for bladder spasm. (Patient not taking: Reported on 06/09/2020) 12 suppository 0 ? nicotine polacrilex (COMMIT) 2 mg lozenge Place 1 lozenge (2 mg total) inside cheek every 2 (two) hours as needed for smoking cessation. (Patient not taking: Reported on 06/09/2020) 108 each 2 ? [EXPIRED] traMADoL (ULTRAM) 50 mg tablet Take 1 tablet (50 mg total) by mouth every 8 (eight) hours as needed for up to 7 days. 20 tablet 0 No facility-administered encounter medications on file as of 06/13/2020. The above PMH/PSH/Meds/allergies/SH/FH was reviewed. REVIEW OF SYSTEMSConstitutional: Per HPIHEENT:  Per HPIRespiratory:  For HPICardiovascular:  For HPIGastrointestinal: Per HPIGenitourinary:  Per HPIMusculoskeletal: Per HPINeurologic: Per HPIPsychiatric: Per HPISkin:  For HPIPhysical Exam: Vital Signs: BP 95/60  - Pulse 66  - Ht 6' (1.829 m)  - Wt (!) 140.6 kg  - BMI 42.04 kg/m? General Appearance: NAD Psychiatric: appropriate mood & affectHEENT: NC/ATCardiovascular:  Normal peripheral pulses; 2+ edema throughout shin/foot-bilateralRespiratory:  Normal rate; unlabored breathing.Skin:  Intact; no erythema/rash.MSK: No joint swelling, rest as belowLUMBAR:Inspection: No asymmetry ROM: Flexion - Hands to knees. ROM-no significant painFacet loading:  NegativePalpatory exam: (+) Tenderness right PSISProvocative tests:(-)SLR Right FABER:  NTLeft FABER:  NTUnable to lie flat on the tableHIP: Inspection: No asymmetry. No atrophyROM:Right internal rotation:  No painLeft internal rotation:  No painPalpatory exam: (-) Tenderness Provocative tests:NTLog rollNEURO:Gait:  Antalgic, wide-based gait, able to take few steps with caneToe walk:  Unable to doHeel walk: unable to doHeel to toe tandem walk:  Unable to doMotor  Strength: Right  Strength: Left  Upper Extremity   Deltoid  5/5  5/5  Biceps  5/5  5/5  Triceps  5/5  5/5  Wrist Extensors  5/5  5/5  Finger abductors 5/5 5/5 Lower Extremity    Iliospoas  5/5  5/5  Quadriceps  5/5  5/5  Tibialis anterior  5/5  5/5  EHL 5/5 5/5 Gastroc soleus  5/5 5/5 Gluteus medius    Sensation: Intact in all dermatomes BUE  BLEDTRs:Reflex Right Left Biceps 0 0 Triceps 0 0 Brachioradialis 0 0 Patella 0 0 Achilles  0 0 Babinski   Clonus Negative Negative Hoffmann Negative Negative LABS:Lab Results Component Value Date  WBC 25.3 (H) 06/02/2020  HGB 10.5 (L) 06/02/2020  HCT 31.5 (L) 06/02/2020  MCV 94.9 (H) 06/02/2020  PLT 249 06/02/2020   Chemistry  Lab Results Component Value Date  NA 142 06/02/2020  K 3.6 06/02/2020  CL 106 06/02/2020  CO2 25 06/02/2020  BUN 33 (H) 06/02/2020  CREATININE 1.51 (H) 06/02/2020  GLU 205 (H) 06/02/2020  Lab Results Component Value Date  CALCIUM 8.6 (L) 06/02/2020  ALKPHOS 95 06/02/2020  AST 19 06/02/2020  ALT 93 (H) 06/02/2020  BILITOT 0.2 06/02/2020  Lab Results Component Value Date  HGBA1C 5.6  04/08/2020 IMAGING:Pertinent new studies interpreted by me and reviewed with patient. Summary stated in the impression section. MRI L-Spine (12/2008)Additional inversion T2 sequences were performed, as well, due to a history of recent trauma.Posterior subcutaneous soft tissue edema is present on sagittal IR T2 sequences. ?The finding is not specific. ?No paraspinal muscle edema, paraspinal edema or vertebral body marrow edema to indicate post-traumatic spinal pathology. A small amount of increased T2 signal is present at the articulations between the L3-4 and L4-5 spinous processes; this is associated with some degenerative bony changes and is considered degenerative in nature.The conus medullaris terminates normally at L1. T11-12 and T12-L1 discs contain no disc protrusion. L1-2: ?Intervertebral disc is desiccated and mildly narrowed. ?A small midline disc protrusion is present without central canal stenosis or foraminal stenosis. ?Small annulus fissure s present. ?Some lumbar lordosis reversal occurs at this level, secondary to degenerative disc changes.L2-3: ?Small right paracentral annulus fissure is present. ?No disc protrusion, central canal or foraminal stenosis.L3-4: ?Desiccated disc. ? Small annulus fissure. ?Bulging disc material enters the neural foramina, but there is no root contact or displacement. ?No central stenosis.L4-5: ?Desiccated disc contains a left paracentral disc protrusion with small extruded fragments, with disc material and extruded fragment entering the left L4-5 neural foramen, compressing the L4 root. ?No central canal stenosis or right neural foramen stenosis.L5-S1: ?Desiccated disc. ?Small central to left paracentral disc protrusion with annulus fissure. ? No central canal or foraminal stenosis.Impression:1. ?Multilevel degenerative changes with left L4-5 paracentral disc protrusion, with disc material and extruded fragment entering the left neural foramen, compressing the left L4 foraminal root.2. ?Small disc protrusion and degenerative endplate and disc changes, L1-2, without stenosis.3. ?Bulging disc within the L3-4 neural foramina without root displacement.4. ?Small left paracentral disc protrusion, L5-S1.MRI L-Spine (05/2018)FINDINGS: Segmentation: ?Standard. Alignment: ?Physiologic. Vertebrae: ?No fracture, evidence of discitis, or bone lesion. Conus medullaris and cauda equina: Conus extends to the T12 level. Conus and cauda equina appear normal. Paraspinal and other soft tissues: No acute paraspinal abnormality. Postsurgical changes in the posterior paraspinal soft tissues at L4-5. Disc levels: Disc spaces: Disc desiccation throughout the lumbar spine. Mild disc height loss at L1-2. T12-L1: No significant disc bulge. No evidence of neural foraminal stenosis. No central canal stenosis. Mild bilateral facet arthropathy. L1-L2: Broad central disc protrusion slightly eccentric towards the right. Mild bilateral facet arthropathy. Mild spinal stenosis. No evidence of neural foraminal stenosis. L2-L3: Broad-based disc bulge. Mild bilateral facet arthropathy. Mild spinal stenosis. No evidence of neural foraminal stenosis. L3-L4: Broad-based disc bulge. Mild bilateral facet arthropathy. Prominence of the posterior epidural fat deforming the thecal sac. No evidence of neural foraminal stenosis. No central canal stenosis. L4-L5: Broad-based disc bulge. Prior left laminectomy. Moderate bilateral facet arthropathy. Small left paracentral disc protrusion. No evidence of neural foraminal stenosis. No central canal stenosis. Prominence of the posterior epidural fat. L5-S1: Small broad left paracentral disc protrusion. Mild bilateral facet arthropathy. No evidence of neural foraminal stenosis. No central canal stenosis. IMPRESSION: 1. At L4-5 there is a broad-based disc bulge. Prior left laminectomy. Moderate bilateral facet arthropathy. Small left paracentral disc protrusion. No evidence of neural foraminal stenosis. No central canal stenosis. Prominence of the posterior epidural fat. 2. At L5-S1 there is a small broad left paracentral disc protrusion. Mild bilateral facet arthropathy. 3. Otherwise lumbar spine spondylosis as described above.  [place] L-Spine (06/2018)Segmentation: Normal. Alignment: 2 mm retrolisthesis at L1-2 and L2-3 is redemonstrated with patient recumbent for Elida. Vertebrae: No worrisome osseous lesion. Conus medullaris: Normal in  size and location, terminating opposite L1. Paraspinal tissues: Aortic atherosclerosis. No aneurysm formation. Transverse diameter?29 mm. Disc levels: IMPRESSION: LUMBAR MYELOGRAM IMPRESSION: Lumbar myelogram demonstrates slight retrolisthesis at L1-2 and L2-3, facet mediated. With patient standing, in extension, ligamentum flavum infolding contributes to functional dynamic instability, leading to moderate, to borderline severe stenosis at the L2-3 and L3-4 levels; mild to moderate stenosis is observed in lateral extension at L1-2.  LUMBAR MYELOGRAM IMPRESSION: Progressive mild-to-moderate stenosis atL1-2 and L2-3 compared to previous lumbar MRI imaging in 2017. This is multifactorial, related to retrolisthesis, posterior element hypertrophy, and bulging/protruding disc material. Mild to moderate stenosis at L3-4, central and to the RIGHT, not significantly changed from priors, but with significant/persistent RIGHT greater than LEFT L4 and L3 neural impingement. No significant recurrent disc pathology at L4-5. Diagnoses Codes for this visit:  ICD-10-CM  1. Chronic bilateral low back pain with bilateral sciatica  M54.42 MRI Lumbar Spine wo IV Contrast  M54.41   G89.29  2. Lumbar radiculitis  M54.16 MRI Lumbar Spine wo IV Contrast 3. Myofascial muscle pain  M79.18 MRI Lumbar Spine wo IV Contrast 4. Lumbar spondylosis  M47.816 MRI Lumbar Spine wo IV Contrast 5. Bilateral leg paresthesia  R20.2  Education: discussed his condition, diagnoses, prognosis, further work up and treatment options. Took the time to answer his questions pertaining to conservative and interventional treatment options.  Explained red flag symptoms for his condition.Follow up: Return sooner if needed, also advised to call with any questions or concerns in the interim.On the day of this patient's encounter, a total of 45 minutes was personally spent by me.  This does not include any resident/fellow teaching time, or any time spent performing a procedural service.This note is produced by voice recognition software. Please excuse typographical and grammatical errors I may have unintentionally missed during my review.Scribed for Weston Brass, DO by Domenic Schwab, medical scribe June 13, 2020 The documentation recorded by the scribe accurately reflects the services I personally performed and the decisions made by me. I reviewed and confirmed all material entered and/or pre-charted by the scribe.Impression & Plan: Listed Harley Alto, DOInterventional Spine and Musculoskeletal MedicinePhysical Medicine and RehabilitationYale Department of Orthopedics and Rehabilitation

## 2020-06-15 ENCOUNTER — Ambulatory Visit: Admit: 2020-06-15 | Payer: PRIVATE HEALTH INSURANCE | Attending: Urology

## 2020-06-15 DIAGNOSIS — R55 Syncope and collapse: Secondary | ICD-10-CM

## 2020-06-15 DIAGNOSIS — R079 Chest pain, unspecified: Secondary | ICD-10-CM

## 2020-06-16 ENCOUNTER — Ambulatory Visit: Admit: 2020-06-16 | Payer: PRIVATE HEALTH INSURANCE | Attending: Hematology

## 2020-06-16 ENCOUNTER — Emergency Department: Admit: 2020-06-16 | Payer: PRIVATE HEALTH INSURANCE | Attending: Diagnostic Radiology

## 2020-06-16 ENCOUNTER — Inpatient Hospital Stay: Admit: 2020-06-16 | Discharge: 2020-06-16 | Payer: PRIVATE HEALTH INSURANCE

## 2020-06-16 ENCOUNTER — Encounter: Admit: 2020-06-16 | Payer: PRIVATE HEALTH INSURANCE | Attending: Medical Oncology

## 2020-06-16 ENCOUNTER — Encounter: Admit: 2020-06-16 | Payer: PRIVATE HEALTH INSURANCE

## 2020-06-16 ENCOUNTER — Emergency Department: Admit: 2020-06-16 | Payer: PRIVATE HEALTH INSURANCE

## 2020-06-16 ENCOUNTER — Telehealth: Admit: 2020-06-16 | Payer: PRIVATE HEALTH INSURANCE | Attending: Medical Oncology

## 2020-06-16 ENCOUNTER — Ambulatory Visit: Admit: 2020-06-16 | Payer: PRIVATE HEALTH INSURANCE | Attending: Critical Care Medicine

## 2020-06-16 ENCOUNTER — Inpatient Hospital Stay
Admit: 2020-06-16 | Discharge: 2020-06-23 | Payer: PRIVATE HEALTH INSURANCE | Source: Home / Self Care | Admitting: Student in an Organized Health Care Education/Training Program

## 2020-06-16 DIAGNOSIS — C7800 Secondary malignant neoplasm of unspecified lung: Secondary | ICD-10-CM

## 2020-06-16 DIAGNOSIS — C679 Malignant neoplasm of bladder, unspecified: Secondary | ICD-10-CM

## 2020-06-16 DIAGNOSIS — R0602 Shortness of breath: Secondary | ICD-10-CM

## 2020-06-16 DIAGNOSIS — I514 Myocarditis, unspecified: Secondary | ICD-10-CM

## 2020-06-16 MED ORDER — OXYCODONE IMMEDIATE RELEASE 5 MG TABLET
5 mg | ORAL_TABLET | ORAL | 1 refills | Status: AC | PRN
Start: 2020-06-16 — End: ?

## 2020-06-16 MED ORDER — OXYCODONE ER 10 MG TABLET,CRUSH RESISTANT,EXTENDED RELEASE 12 HR
10 mg | ORAL_TABLET | Freq: Two times a day (BID) | ORAL | 1 refills | Status: AC
Start: 2020-06-16 — End: ?

## 2020-06-16 MED ORDER — FUROSEMIDE 10 MG/ML INJECTION SOLUTION
10 mg/mL | Freq: Once | INTRAVENOUS | Status: CP
Start: 2020-06-16 — End: ?
  Administered 2020-06-17: 04:00:00 10 mL via INTRAVENOUS

## 2020-06-16 NOTE — Telephone Encounter
Spoke to Paul Hunter, confirmed he is taking the long acting Qxycontin 10 mg every 12 hours and using the short acting Oxycodone 5mg  a couple of times a day.He has only one oxycodone left and will run ot of the long acting on 10/24. Will ask Dr Kathrynn Ducking to renew today and My Chart [name] when it is done.

## 2020-06-16 NOTE — Telephone Encounter
[place] is returning Donna's call. He stated he is taking the long acting oxycontin, but is out of the quick release and also only has one tablet left of tramadol.

## 2020-06-17 ENCOUNTER — Emergency Department: Admit: 2020-06-17 | Payer: PRIVATE HEALTH INSURANCE

## 2020-06-17 ENCOUNTER — Inpatient Hospital Stay: Admit: 2020-06-17 | Payer: PRIVATE HEALTH INSURANCE

## 2020-06-17 DIAGNOSIS — R0602 Shortness of breath: Secondary | ICD-10-CM

## 2020-06-17 LAB — UA REFLEX CULTURE

## 2020-06-17 LAB — CBC WITH AUTO DIFFERENTIAL
BKR WAM ABSOLUTE IMMATURE GRANULOCYTES: 0.4 x 1000/ÂµL — ABNORMAL HIGH (ref 0.0–0.3)
BKR WAM ABSOLUTE IMMATURE GRANULOCYTES: 0.5 x 1000/??L — ABNORMAL HIGH (ref 0.0–0.3)
BKR WAM ABSOLUTE IMMATURE GRANULOCYTES: 0.6 x 1000/??L — ABNORMAL HIGH (ref 0.0–0.3)
BKR WAM ABSOLUTE LYMPHOCYTE COUNT: 0.9 x 1000/??L — ABNORMAL LOW (ref 1.0–4.0)
BKR WAM ABSOLUTE LYMPHOCYTE COUNT: 1.1 x 1000/??L — ABNORMAL HIGH (ref 1.0–4.0)
BKR WAM ABSOLUTE LYMPHOCYTE COUNT: 0.8 x 1000/??L — ABNORMAL LOW (ref 1.0–4.0)
BKR WAM ABSOLUTE NRBC: 0 x 1000/ÂµL (ref 0.0–0.0)
BKR WAM ABSOLUTE NRBC: 0 x 1000/ÂµL (ref 0.0–0.0)
BKR WAM ABSOLUTE NRBC: 0 x 1000/??L (ref 0.0–0.0)
BKR WAM ANALYZER ANC: 24.5 x 1000/??L — ABNORMAL HIGH (ref 1.0–11.0)
BKR WAM ANALYZER ANC: 21.7 x 1000/ÂµL — ABNORMAL HIGH (ref 1.0–11.0)
BKR WAM ANALYZER ANC: 25.6 x 1000/??L — ABNORMAL HIGH (ref 1.0–11.0)
BKR WAM BASOPHIL ABSOLUTE COUNT: 0 x 1000/ÂµL (ref 0.0–0.0)
BKR WAM BASOPHIL ABSOLUTE COUNT: 0 x 1000/??L (ref 0.0–0.0)
BKR WAM BASOPHIL ABSOLUTE COUNT: 0.1 x 1000/??L — ABNORMAL HIGH (ref 0.0–0.0)
BKR WAM BASOPHILS: 0.2 % (ref 0.0–4.0)
BKR WAM BASOPHILS: 0.1 % (ref 0.0–4.0)
BKR WAM BASOPHILS: 0.2 % (ref 0.0–4.0)
BKR WAM EOSINOPHIL ABSOLUTE COUNT: 0 x 1000/??L (ref 0.0–1.0)
BKR WAM EOSINOPHIL ABSOLUTE COUNT: 0 x 1000/ÂµL (ref 0.0–1.0)
BKR WAM EOSINOPHIL ABSOLUTE COUNT: 0 x 1000/??L (ref 0.0–1.0)
BKR WAM EOSINOPHILS: 0.1 % (ref 0.0–7.0)
BKR WAM EOSINOPHILS: 0 % (ref 0.0–7.0)
BKR WAM EOSINOPHILS: 0 % (ref 0.0–7.0)
BKR WAM HEMATOCRIT: 31.1 % — ABNORMAL LOW (ref 37.0–52.0)
BKR WAM HEMATOCRIT: 31.2 % — ABNORMAL LOW (ref 37.0–52.0)
BKR WAM HEMATOCRIT: 33.3 % — ABNORMAL LOW (ref 37.0–52.0)
BKR WAM HEMOGLOBIN: 10.2 g/dL — ABNORMAL LOW (ref 12.0–18.0)
BKR WAM HEMOGLOBIN: 10.4 g/dL — ABNORMAL LOW (ref 12.0–18.0)
BKR WAM HEMOGLOBIN: 11.1 g/dL — ABNORMAL LOW (ref 12.0–18.0)
BKR WAM IMMATURE GRANULOCYTES: 1.6 % (ref 0.0–3.0)
BKR WAM IMMATURE GRANULOCYTES: 2 % (ref 0.0–3.0)
BKR WAM IMMATURE GRANULOCYTES: 2.1 % (ref 0.0–3.0)
BKR WAM LYMPHOCYTES: 3.3 % — ABNORMAL LOW (ref 8.0–49.0)
BKR WAM LYMPHOCYTES: 4.4 % — ABNORMAL LOW (ref 8.0–49.0)
BKR WAM LYMPHOCYTES: 2.9 % — ABNORMAL LOW (ref 8.0–49.0)
BKR WAM MCH (PG): 32.7 pg — ABNORMAL HIGH (ref 27.0–31.0)
BKR WAM MCH (PG): 31.5 pg — ABNORMAL HIGH (ref 27.0–31.0)
BKR WAM MCH (PG): 32.3 pg — ABNORMAL HIGH (ref 27.0–31.0)
BKR WAM MCHC: 32.8 g/dL (ref 31.0–36.0)
BKR WAM MCHC: 33.3 g/dL — ABNORMAL HIGH (ref 31.0–36.0)
BKR WAM MCHC: 33.3 g/dL (ref 31.0–36.0)
BKR WAM MCV: 99.7 fL — ABNORMAL HIGH (ref 78.0–94.0)
BKR WAM MCV: 94.5 fL — ABNORMAL HIGH (ref 78.0–94.0)
BKR WAM MCV: 96.8 fL — ABNORMAL HIGH (ref 78.0–94.0)
BKR WAM MONOCYTE ABSOLUTE COUNT: 0.3 x 1000/ÂµL (ref 0.0–2.0)
BKR WAM MONOCYTE ABSOLUTE COUNT: 0.6 x 1000/??L (ref 0.0–2.0)
BKR WAM MONOCYTE ABSOLUTE COUNT: 0.6 x 1000/ÂµL (ref 0.0–2.0)
BKR WAM MONOCYTES: 1.3 % — ABNORMAL LOW (ref 4.0–15.0)
BKR WAM MONOCYTES: 2.5 % — ABNORMAL LOW (ref 4.0–15.0)
BKR WAM MONOCYTES: 2.1 % — ABNORMAL LOW (ref 4.0–15.0)
BKR WAM MPV: 10.3 fL — ABNORMAL HIGH (ref 6.0–11.0)
BKR WAM MPV: 10 fL (ref 6.0–11.0)
BKR WAM MPV: 10.1 fL (ref 6.0–11.0)
BKR WAM NEUTROPHILS: 93.5 % — ABNORMAL HIGH (ref 37.0–84.0)
BKR WAM NEUTROPHILS: 91 % — ABNORMAL HIGH (ref 37.0–84.0)
BKR WAM NEUTROPHILS: 92.7 % — ABNORMAL HIGH (ref 37.0–84.0)
BKR WAM NUCLEATED RED BLOOD CELLS: 0 % (ref 0.0–1.0)
BKR WAM NUCLEATED RED BLOOD CELLS: 0 % (ref 0.0–1.0)
BKR WAM NUCLEATED RED BLOOD CELLS: 0.1 % (ref 0.0–1.0)
BKR WAM PLATELETS: 268 x1000/ÂµL (ref 140–440)
BKR WAM PLATELETS: 261 x1000/??L (ref 140–440)
BKR WAM PLATELETS: 244 x1000/ÂµL (ref 140–440)
BKR WAM RDW-CV: 15.8 % — ABNORMAL HIGH (ref 11.5–14.5)
BKR WAM RDW-CV: 15.7 % — ABNORMAL HIGH (ref 11.5–14.5)
BKR WAM RED BLOOD CELL COUNT: 3.1 M/??L — ABNORMAL LOW (ref 3.8–5.9)
BKR WAM RED BLOOD CELL COUNT: 3.3 M/??L — ABNORMAL LOW (ref 3.8–5.9)
BKR WAM RED BLOOD CELL COUNT: 3.4 M/??L — ABNORMAL LOW (ref 3.8–5.9)
BKR WAM WHITE BLOOD CELL COUNT: 26.1 x1000/??L — ABNORMAL HIGH (ref 4.0–10.0)
BKR WAM WHITE BLOOD CELL COUNT: 23.9 x1000/ÂµL — ABNORMAL HIGH (ref 4.0–10.0)
BKR WAM WHITE BLOOD CELL COUNT: 27.6 x1000/ÂµL — ABNORMAL HIGH (ref 4.0–10.0)

## 2020-06-17 LAB — BASIC METABOLIC PANEL
BKR A/G RATIO: 137 mmol/L (ref 136–144)
BKR ANION GAP: 15 x1000/??L (ref 7–17)
BKR BLOOD UREA NITROGEN: 29 mg/dL — ABNORMAL HIGH (ref 8–23)
BKR BLOOD UREA NITROGEN: 30 mg/dL — ABNORMAL HIGH (ref 8–23)
BKR BUN / CREAT RATIO: 18.6 (ref 8.0–23.0)
BKR BUN / CREAT RATIO: 18.6 (ref 8.0–23.0)
BKR CALCIUM: 8.7 mg/dL — ABNORMAL LOW (ref 8.8–10.2)
BKR CALCIUM: 8.7 mg/dL — ABNORMAL LOW (ref 8.8–10.2)
BKR CHLORIDE: 101 mmol/L — ABNORMAL HIGH (ref 98–107)
BKR CHLORIDE: 98 mmol/L (ref 98–107)
BKR CO2: 26 mmol/L (ref 20–30)
BKR CREATININE: 1.56 mg/dL — ABNORMAL HIGH (ref 0.40–1.30)
BKR CREATININE: 1.61 mg/dL — ABNORMAL HIGH (ref 0.40–1.30)
BKR EGFR (AFR AMER): 54 mL/min/1.73m2 (ref >60)
BKR EGFR (AFR AMER): 52 mL/min/1.73m2 — ABNORMAL LOW (ref >60)
BKR EGFR (NON AFRICAN AMERICAN): 45 mL/min/1.73m2 — ABNORMAL HIGH (ref >60)
BKR EGFR (NON AFRICAN AMERICAN): 43 mL/min/{1.73_m2} (ref >60)
BKR GLUCOSE: 170 mg/dL — ABNORMAL HIGH (ref 70–100)
BKR GLUCOSE: 158 mg/dL — ABNORMAL HIGH (ref 70–100)
BKR POTASSIUM: 4.7 mmol/L (ref 3.3–5.3)
BKR POTASSIUM: 4 mmol/L — ABNORMAL HIGH (ref 3.3–5.3)
BKR SODIUM: 137 mmol/L (ref 136–144)
BKR SODIUM: 139 mmol/L (ref 136–144)

## 2020-06-17 LAB — LIVER FUNCTION TESTS     (YH)
BKR ALANINE AMINOTRANSFERASE (ALT): 33 U/L (ref 9–59)
BKR ALKALINE PHOSPHATASE: 125 U/L — ABNORMAL HIGH (ref 9–122)
BKR ASPARTATE AMINOTRANSFERASE (AST): 21 U/L (ref 10–35)
BKR AST/ALT RATIO: 0.6
BKR BILIRUBIN DIRECT: <0.2 mg/dL (ref <=0.3)
BKR BILIRUBIN TOTAL: 0.3 mg/dL (ref <=1.2)

## 2020-06-17 LAB — SARS COV-2 (COVID-19) RNA-~~LOC~~ LABS (BH GH LMW YH): BKR SARS-COV-2 RNA (COVID-19) (YH): NEGATIVE

## 2020-06-17 LAB — COMPREHENSIVE METABOLIC PANEL
BKR A/G RATIO: 1.6 (ref 1.0–2.2)
BKR ALANINE AMINOTRANSFERASE (ALT): 34 U/L (ref 9–59)
BKR ALBUMIN: 3.2 g/dL — ABNORMAL LOW (ref 3.6–4.9)
BKR ALKALINE PHOSPHATASE: 118 U/L (ref 9–122)
BKR ANION GAP: 13 (ref 7–17)
BKR ASPARTATE AMINOTRANSFERASE (AST): 13 U/L (ref 10–35)
BKR AST/ALT RATIO: 0.4
BKR BILIRUBIN TOTAL: 0.2 mg/dL (ref <=1.2)
BKR BLOOD UREA NITROGEN: 25 mg/dL — ABNORMAL HIGH (ref 8–23)
BKR BUN / CREAT RATIO: 15.4 % — ABNORMAL LOW (ref 8.0–23.0)
BKR CALCIUM: 8.5 mg/dL — ABNORMAL LOW (ref 8.8–10.2)
BKR CHLORIDE: 101 mmol/L (ref 98–107)
BKR CO2: 26 mmol/L (ref 20–30)
BKR CREATININE: 1.62 mg/dL — ABNORMAL HIGH (ref 0.40–1.30)
BKR EGFR (AFR AMER): 52 mL/min/1.73m2 — ABNORMAL HIGH (ref >60)
BKR EGFR (NON AFRICAN AMERICAN): 43 mL/min/{1.73_m2} (ref >60)
BKR GLOBULIN: 2 g/dL — ABNORMAL LOW (ref 2.3–3.5)
BKR GLUCOSE: 164 mg/dL — ABNORMAL HIGH (ref 70–100)
BKR POTASSIUM: 4.6 mmol/L — ABNORMAL LOW (ref 3.3–5.3)
BKR PROTEIN TOTAL: 5.2 g/dL — ABNORMAL LOW (ref 6.6–8.7)
BKR SODIUM: 140 mmol/L (ref 136–144)

## 2020-06-17 LAB — URINALYSIS WITH CULTURE REFLEX      (BH LMW YH)
BKR BILIRUBIN, UA: NEGATIVE
BKR BLOOD, UA: NEGATIVE
BKR GLUCOSE, UA: NEGATIVE ng/mL — ABNORMAL HIGH (ref <0.01)
BKR KETONES, UA: NEGATIVE
BKR LEUKOCYTE ESTERASE, UA: POSITIVE — AB
BKR NITRITE, UA: NEGATIVE
BKR PH, UA: 6 mg/L FEU — ABNORMAL HIGH (ref 5.5–7.5)
BKR SPECIFIC GRAVITY, UA: 1.019 (ref 1.005–1.030)
BKR UROBILINOGEN, UA: <2 EU/dL (ref <=2.0)

## 2020-06-17 LAB — TROPONIN T     (Q)
BKR TROPONIN T: 0.03 ng/mL — ABNORMAL HIGH (ref <0.01)
BKR TROPONIN T: 0.03 ng/mL — ABNORMAL HIGH (ref <0.01)
BKR TROPONIN T: 0.04 ng/mL — ABNORMAL HIGH (ref <0.01)
BKR TROPONIN T: 0.05 ng/mL — ABNORMAL HIGH (ref <0.01)

## 2020-06-17 LAB — PROTIME AND INR
BKR INR: 0.94 U/L (ref 0.87–1.14)
BKR PROTHROMBIN TIME: 10.3 s (ref 9.6–12.3)

## 2020-06-17 LAB — URINE MICROSCOPIC     (BH GH LMW YH)
BKR HYALINE CASTS, UA INSTRUMENT (NUMERIC): 0 /LPF (ref 0–3)
BKR RBC/HPF INSTRUMENT: 3 /HPF — ABNORMAL HIGH (ref 0–2)
BKR WBC/HPF INSTRUMENT: 19 /HPF — ABNORMAL HIGH (ref 0–5)

## 2020-06-17 LAB — HEPATIC FUNCTION PANEL
BKR ALANINE AMINOTRANSFERASE (ALT): 33 U/L (ref 9–59)
BKR ALBUMIN: 2.8 g/dL — ABNORMAL LOW (ref 3.6–4.9)
BKR ALKALINE PHOSPHATASE: 128 U/L — ABNORMAL HIGH (ref 9–122)
BKR ASPARTATE AMINOTRANSFERASE (AST): 19 U/L (ref 10–35)
BKR AST/ALT RATIO: 0.6
BKR BILIRUBIN DIRECT: <0.2 mg/dL — ABNORMAL HIGH (ref <=0.3)
BKR BILIRUBIN TOTAL: <0.2 mg/dL (ref <=1.2)
BKR GLOBULIN: 2.9 g/dL (ref 2.3–3.5)
BKR PROTEIN TOTAL: 5.7 g/dL — ABNORMAL LOW (ref 6.6–8.7)

## 2020-06-17 LAB — D-DIMER, QUANTITATIVE: BKR D-DIMER: 1.78 mg{FEU}/L — ABNORMAL HIGH (ref <=0.67)

## 2020-06-17 LAB — NT-PROBNPE
BKR B-TYPE NATRIURETIC PEPTIDE, PRO (PROBNP): 1235 pg/mL — ABNORMAL HIGH (ref <125.0)
BKR B-TYPE NATRIURETIC PEPTIDE, PRO (PROBNP): 1369 pg/mL — ABNORMAL HIGH (ref <125.0)

## 2020-06-17 LAB — MAGNESIUM: BKR MAGNESIUM: 2 mg/dL (ref 1.7–2.4)

## 2020-06-17 MED ORDER — ENTERAL HYPOGLYCEMIA MANAGEMENT PRODUCT
1 | ORAL | Status: DC | PRN
Start: 2020-06-17 — End: 2020-06-24

## 2020-06-17 MED ORDER — INSULIN LISPRO 100 UNIT/ML (SLIDING SCALE)
100 unit/mL | Freq: Every evening | SUBCUTANEOUS | Status: DC
Start: 2020-06-17 — End: 2020-06-24

## 2020-06-17 MED ORDER — LISINOPRIL 2.5 MG TABLET
2.5 mg | Freq: Every day | ORAL | Status: DC
Start: 2020-06-17 — End: 2020-06-24
  Administered 2020-06-17 – 2020-06-21 (×5): 2.5 mg via ORAL

## 2020-06-17 MED ORDER — ENOXAPARIN 40 MG/0.4 ML SUBCUTANEOUS SYRINGE
40 mg/0.4 mL | SUBCUTANEOUS | Status: DC
Start: 2020-06-17 — End: 2020-06-17

## 2020-06-17 MED ORDER — ASPIRIN 81 MG TABLET,DELAYED RELEASE
81 mg | Freq: Every day | ORAL | Status: DC
Start: 2020-06-17 — End: 2020-06-24
  Administered 2020-06-17 – 2020-06-23 (×7): 81 mg via ORAL

## 2020-06-17 MED ORDER — GABAPENTIN 300 MG CAPSULE
300 mg | Freq: Every evening | ORAL | Status: DC
Start: 2020-06-17 — End: 2020-06-24
  Administered 2020-06-18 – 2020-06-23 (×6): 300 mg via ORAL

## 2020-06-17 MED ORDER — ZZ IMS TEMPLATE
Freq: Every day | ORAL | Status: DC
Start: 2020-06-17 — End: 2020-06-21
  Administered 2020-06-17 – 2020-06-21 (×5): 10 mg via ORAL

## 2020-06-17 MED ORDER — DEXTROSE 50 % IN WATER (D50W) INTRAVENOUS SYRINGE
INTRAVENOUS | Status: DC | PRN
Start: 2020-06-17 — End: 2020-06-18

## 2020-06-17 MED ORDER — POLYETHYLENE GLYCOL 3350 17 GRAM ORAL POWDER PACKET
17 gram | Freq: Every day | ORAL | Status: DC | PRN
Start: 2020-06-17 — End: 2020-06-24
  Administered 2020-06-20: 01:00:00 17 gram via ORAL

## 2020-06-17 MED ORDER — CALCIUM CARBONATE 500 MG-VITAMIN D3 5 MCG (200 UNIT) TABLET
1250 mg (500 mg calcium) - 200 unit | Freq: Every day | ORAL | Status: DC
Start: 2020-06-17 — End: 2020-06-24
  Administered 2020-06-17 – 2020-06-23 (×7): 1250 mg (500 mg calcium) - 200 unit via ORAL

## 2020-06-17 MED ORDER — NICOTINE 21 MG/24 HR DAILY TRANSDERMAL PATCH
21 mg | Freq: Every day | TRANSDERMAL | Status: DC
Start: 2020-06-17 — End: 2020-06-24

## 2020-06-17 MED ORDER — SODIUM CHLORIDE 0.9 % (FLUSH) INJECTION SYRINGE
0.9 % | INTRAVENOUS | Status: DC | PRN
Start: 2020-06-17 — End: 2020-06-24

## 2020-06-17 MED ORDER — CLOPIDOGREL 75 MG TABLET
75 mg | Freq: Every day | ORAL | Status: AC
Start: 2020-06-17 — End: ?

## 2020-06-17 MED ORDER — LORAZEPAM 0.5 MG TABLET
0.5 mg | Freq: Four times a day (QID) | ORAL | Status: DC | PRN
Start: 2020-06-17 — End: 2020-06-24

## 2020-06-17 MED ORDER — CARBOXYMETHYLCELLULOSE SODIUM 0.5 % EYE DROPS IN A DROPPERETTE
0.5 % | Freq: Three times a day (TID) | OPHTHALMIC | Status: DC | PRN
Start: 2020-06-17 — End: 2020-06-24

## 2020-06-17 MED ORDER — SODIUM CHLORIDE 0.9 % (FLUSH) INJECTION SYRINGE
0.9 % | Freq: Three times a day (TID) | INTRAVENOUS | Status: DC
Start: 2020-06-17 — End: 2020-06-24
  Administered 2020-06-17 – 2020-06-20 (×5): 0.9 mL via INTRAVENOUS

## 2020-06-17 MED ORDER — ENOXAPARIN 40 MG/0.4 ML SUBCUTANEOUS SYRINGE
40 mg/0.4 mL | Freq: Two times a day (BID) | SUBCUTANEOUS | Status: DC
Start: 2020-06-17 — End: 2020-06-24
  Administered 2020-06-17 – 2020-06-23 (×12): 40 mg/0.4 mL via SUBCUTANEOUS

## 2020-06-17 MED ORDER — METOPROLOL SUCCINATE ER (TOPROL) 12.5 MG HALFTAB
12.5 mg | Freq: Every day | ORAL | Status: DC
Start: 2020-06-17 — End: 2020-06-24
  Administered 2020-06-17 – 2020-06-21 (×4): 12.5 mg via ORAL

## 2020-06-17 MED ORDER — SODIUM CHLORIDE 0.9 % BOLUS (NEW BAG)
0.9 % | Freq: Once | INTRAVENOUS | Status: CP | PRN
Start: 2020-06-17 — End: ?
  Administered 2020-06-17: 06:00:00 0.9 mL/h via INTRAVENOUS

## 2020-06-17 MED ORDER — OXYCODONE ER 10 MG TABLET,CRUSH RESISTANT,EXTENDED RELEASE 12 HR
10 mg | Freq: Two times a day (BID) | ORAL | Status: DC
Start: 2020-06-17 — End: 2020-06-24
  Administered 2020-06-17 – 2020-06-23 (×13): 10 mg via ORAL

## 2020-06-17 MED ORDER — IOHEXOL 350 MG IODINE/ML INTRAVENOUS SOLUTION
350 mg iodine/mL | Freq: Once | INTRAVENOUS | Status: CP | PRN
Start: 2020-06-17 — End: ?
  Administered 2020-06-17: 06:00:00 350 mL via INTRAVENOUS

## 2020-06-17 MED ORDER — ESCITALOPRAM 10 MG TABLET
10 mg | Freq: Every evening | ORAL | Status: DC
Start: 2020-06-17 — End: 2020-06-24
  Administered 2020-06-18 – 2020-06-23 (×6): 10 mg via ORAL

## 2020-06-17 MED ORDER — OXYBUTYNIN CHLORIDE ER 5 MG TABLET,EXTENDED RELEASE 24 HR
5 mg | Freq: Every day | ORAL | Status: DC
Start: 2020-06-17 — End: 2020-06-24
  Administered 2020-06-17 – 2020-06-23 (×7): 5 mg via ORAL

## 2020-06-17 MED ORDER — ENTERAL HYPOGLYCEMIA MANAGEMENT PRODUCT < 50 MG/DL
1 | ORAL | Status: DC | PRN
Start: 2020-06-17 — End: 2020-06-24

## 2020-06-17 MED ORDER — SULFAMETHOXAZOLE 800 MG-TRIMETHOPRIM 160 MG TABLET
800-160 mg | ORAL | Status: DC
Start: 2020-06-17 — End: 2020-06-24
  Administered 2020-06-17 – 2020-06-22 (×3): 800-160 mg via ORAL

## 2020-06-17 MED ORDER — DEXTROSE 50 % IN WATER (D50W) INTRAVENOUS SYRINGE
INTRAVENOUS | Status: DC | PRN
Start: 2020-06-17 — End: 2020-06-24

## 2020-06-17 MED ORDER — PANTOPRAZOLE 40 MG TABLET,DELAYED RELEASE
40 mg | Freq: Every day | ORAL | Status: DC
Start: 2020-06-17 — End: 2020-06-24
  Administered 2020-06-17 – 2020-06-23 (×7): 40 mg via ORAL

## 2020-06-17 MED ORDER — TAMSULOSIN 0.4 MG CAPSULE
0.4 mg | Freq: Every evening | ORAL | Status: DC
Start: 2020-06-17 — End: 2020-06-24
  Administered 2020-06-18 – 2020-06-23 (×6): 0.4 mg via ORAL

## 2020-06-17 MED ORDER — OXYCODONE IMMEDIATE RELEASE 5 MG TABLET
5 mg | ORAL | Status: DC | PRN
Start: 2020-06-17 — End: 2020-06-24
  Administered 2020-06-18 – 2020-06-23 (×10): 5 mg via ORAL

## 2020-06-17 MED ORDER — ROSUVASTATIN 40 MG TABLET
40 mg | Freq: Every day | ORAL | 7 refills | Status: DC
Start: 2020-06-17 — End: 2020-06-24
  Administered 2020-06-17 – 2020-06-23 (×7): 40 mg via ORAL

## 2020-06-17 MED ORDER — INSULIN LISPRO 100 UNIT/ML (SLIDING SCALE)
100 unit/mL | Freq: Three times a day (TID) | SUBCUTANEOUS | Status: DC
Start: 2020-06-17 — End: 2020-06-24
  Administered 2020-06-19 – 2020-06-22 (×7): 100 mL via SUBCUTANEOUS

## 2020-06-17 MED ORDER — ALBUTEROL SULFATE 2.5 MG/3 ML (0.083 %) SOLUTION FOR NEBULIZATION
2.5 mg /3 mL (0.083 %) | RESPIRATORY_TRACT | Status: DC | PRN
Start: 2020-06-17 — End: 2020-06-20
  Administered 2020-06-19 – 2020-06-20 (×2): 2.5 mL via RESPIRATORY_TRACT

## 2020-06-17 MED ORDER — SENNOSIDES 8.6 MG-DOCUSATE SODIUM 50 MG TABLET
Freq: Two times a day (BID) | ORAL | Status: DC
Start: 2020-06-17 — End: 2020-06-24
  Administered 2020-06-17 – 2020-06-23 (×10): via ORAL

## 2020-06-17 MED ORDER — ACETAMINOPHEN 325 MG TABLET
325 mg | Freq: Four times a day (QID) | ORAL | Status: DC | PRN
Start: 2020-06-17 — End: 2020-06-24
  Administered 2020-06-20: 14:00:00 325 mg via ORAL

## 2020-06-17 MED ORDER — SIMETHICONE 80 MG CHEWABLE TABLET
80 mg | Freq: Four times a day (QID) | ORAL | Status: DC | PRN
Start: 2020-06-17 — End: 2020-06-24

## 2020-06-17 MED ORDER — GLUCAGON 1 MG/ML IN STERILE WATER
Freq: Once | INTRAMUSCULAR | Status: DC | PRN
Start: 2020-06-17 — End: 2020-06-24

## 2020-06-17 MED ORDER — FUROSEMIDE 10 MG/ML INJECTION SOLUTION
10 mg/mL | Freq: Two times a day (BID) | INTRAVENOUS | Status: DC
Start: 2020-06-17 — End: 2020-06-18
  Administered 2020-06-17 (×2): 10 mL via INTRAVENOUS

## 2020-06-17 MED ORDER — PERFLUTREN LIPID MICROSPHERES 1.3 ML/10 ML NS INJ (IN
Freq: Once | INTRAVENOUS | Status: CP | PRN
Start: 2020-06-17 — End: ?
  Administered 2020-06-17: 15:00:00 10.000 mL via INTRAVENOUS

## 2020-06-17 NOTE — ED Notes
8:41 AM Patient is resting in his room.  Patient is awake, alert, oriented.  Patient states he just received a new prescription for an Immediate Release Pain Medication.11:00 AM patient is resting, will continue to monitor12:00 PM patient is eating Patient is taken to [facility] PM patient's family is at bedside 6:54 PM patient is informed of having a room upstairs, [place] books transport

## 2020-06-17 NOTE — Utilization Review (ED)
UM Status: Commercial - IP

## 2020-06-17 NOTE — Plan of Care
Problem: Adult Inpatient Plan of Care  Goal: Readiness for Transition of Care  Outcome: Initial problem identification     Case Management Assessment    69 y.o. male  Hx of HTN, T2DM, CAD s/p PCI x3 (2014) and recent history of Stage IV NSCLC (dx 06/2019), worsening CM (CAD vs ICI toxicity) and new bladder malignancy (dx 04/2020) who was recently admitted 10/1-10/4/21 for presyncopal episode believed 2/2 NSTEMI (ICI Myocarditis vs iCM). He presents today as MEDICAL PATIENT for sudden onset SOB that started at 8PM while at rest.   Met with patient who reports he resides alone in a single family home with 16 steps to enter with rails. Patient states he is currently active with [facility] for home Nursing,PT,OT, and HHA. He denies use of any DME, has no PCP. Patient had difficulty maintaining attention during this interaction.  Assessment screening completed. Continue to follow patient's progress and discuss plan of care with treatment team.  Discharge needs not determined at this time.   Care Management will continue to follow with team.      Most Recent Value   Case Management Assessment   Patient Requires Care Coordination Intervention Due To change in physical function   Arrived from prior to admission home   Bed Hold  n/a   Services Prior to Admission home care agency (specify services)   Home care agency name: [facility]   ADL Assistance None   Type of Home Care Services Nurse visit   Equipment Currently Used at Home none   Documented Insurance Accurate Yes   Any financial concerns related to anticipated discharge needs No   Patient's home address verified Yes   Patient's PCP of record verified No   Living Environment    Lives With Alone   Current Living Arrangements home/apartment/condo   Home Accessibility no concerns, stairs to enter home   Number of Stairs, Main Entrance other (see comments)  [16]   Surface of Stairs, Main Entrance concrete   Stair Railings, Main Entrance railings safe and in good condition Transportation Available car, family or friend will provide   Home Safety   Feels Safe Living In Home yes   Source of Clinical History   Patient's clinical history has been reviewed and source of Information is: Patient, Medical Provider   CM/SW Attestation: Choose which ONE is appropriate for you   I have reviewed the medical record and completed the above evaluation with the following recommendations. Yes   Discharge Planning Coordination Recommendations   Discharge Planning Coordination Recommendations Needs not determined at this time   RN reviewed plan of care/ continuum of care need's with  Patient          Plan of Care Overview/ Patient Status

## 2020-06-17 NOTE — ED Notes
7:09 PM - Assumed care of pt.

## 2020-06-17 NOTE — ED Notes
11:14 PM pt in ambulatory from home after having increased difficulty breathing at home, was given CPAP with 2 duo nebs, and 2 albuterol's nebs en route, now on NRB at 15 with SA o2 sat of 97% , pt speaking in full clear sentences,   `11:26 PM bedside echo completed by MD, ekg in process, labs drawn , and sent - hx of CHF and Lung CA11:48 PM pt with + trop T, went to CXR, plan for Lasix when pt returns 1:02 AM urine sent pt daughter et bedside [place] awaiting protocall will speak to resident for protocol d. Dimer elevated to 1.78 n2:10 AM pt taken to cta report given to RN Kayla, pt to be moved to RM A 17 post cta 2:46 AM pt with Foley cath #16 placed by RN w/o any issue- EKG complete, trop running 3:03 AM Pt with Trop T sent- MD Laurence Aly aware pt Trop I with Higher elevation that prior

## 2020-06-17 NOTE — H&P
[facility]	 Medicine History & PhysicalHistory provided by: the patient and EMR reviewHistory limited by: no limitationsPatient presents from: HomeSubjective: Chief Complaint: dyspneaDavid J Hunter is a 69 y.o. 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 He reports noticing fluid accumulating in his legs the past several days and that they became so swollen they hurt however he deferred medical intervention. Today while he was eating dinner he developed abrupt onset dyspnea and needed to call EMS. He denies dietary indiscretion and reports eating meals provided by his health insurance company this week which he believes are low sodium. He has been on high dose prednisone taper for myocarditis recently thought to be related to his pembrolizumab resulting in diastolic HF.On arrival to the ED afebrile, BP 130/79, HR 73, initially breathing well on room air then requiring 2 L NC O2 while sleeping. Labs notable for stable BMP and CBC to prior, with Cr 1.6. BNP slightly lower than prior at 1,235. Trop T 0.03->0.04. D-dimer 1.78, INR 0.9. He underwent CTA PE which was negative for PE but possibly revealed progression of pulmonary metastases. EKG sinus with APC's however monitor in room with frequent ventricular ectopy. On interview the patient reports substantial improvement in breathing since arrival. He reports some ongoing despair about his progressive malignancy and states he is becoming more hopeless.Medical History: PMH PSH Past Medical History: Diagnosis Date ? Anxiety  ? CAD (coronary artery disease)  ? Diabetes mellitus (HC Code) (HC CODE)  ? Difficult intubation 05/22/2020  Patient with significant soft tissue in oropharynx. Glidescope intubation revealed anterior cords with floppy soft tissue obstruction. Patient will require video laryngoscopy for airway management going forward. ? GERD (gastroesophageal reflux disease)  ? Hyperlipidemia  ? Hypertension  ? Hypertrophy of prostate without urinary obstruction and other lower urinary tract symptoms (LUTS)  ? Other testicular hypofunction  ? Spinal stenosis 09/28/2018 ? Tobacco abuse  ? Vitamin D deficiency   Past Surgical History: Procedure Laterality Date ? CAROTID STENT  08/28/2003 ? CHOLECYSTECTOMY  11/07/2016 ? KNEE SURGERY    torn meniscus ? LAMINECTOMY  10/07/2008 ? LUMBAR DISC SURGERY    herniated disc   Family History family history is not on file.Social History  reports that he has been smoking cigarettes. He does not have any smokeless tobacco history on file. No history on file for alcohol use and drug use.Prior to Admission Medications (Not in a hospital admission) Allergies No Known Allergies Review of Systems: Review of Systems Respiratory: Positive for shortness of breath.  Cardiovascular: Positive for leg swelling. All other systems reviewed and are negative.  Objective: Vitals:Most recent: Patient Vitals for the past 24 hrs: BP Temp Temp src Pulse Resp SpO2 Height Weight 06/17/20 0607 (!) 116/51 -- -- 78 16 96 % -- -- 06/17/20 0433 -- 97.3 ?F (36.3 ?C) Axillary -- -- -- -- -- 06/17/20 0420 137/68 -- -- 73 -- 98 % -- -- 06/17/20 0248 113/61 (!) 96 ?F (35.6 ?C) Oral 63 -- 99 % -- -- 06/17/20 0006 (!) 106/51 97.7 ?F (36.5 ?C) Oral 62 18 97 % -- -- 06/16/20 2317 120/78 97.5 ?F (36.4 ?C) Oral 86 (!) 22 97 % -- -- 06/16/20 2308 130/79 (!) 96.9 ?F (36.1 ?C) Temporal 73 20 97 % 5' 10 (1.778 m) 136.1 kg Physical Exam: Physical ExamConstitutional:     General: He is not in acute distress. Appearance: He  is ill-appearing. HENT:    Mouth/Throat:    Mouth: Mucous membranes are moist. Cardiovascular:    Rate and Rhythm: Normal rate. Rhythm irregular.    Pulses: Normal pulses.    Heart sounds: Murmur heard. Pulmonary:    Effort: Pulmonary effort is normal.    Breath sounds: Normal breath sounds. Abdominal:    General: Bowel sounds are normal. There is distension.    Palpations: Abdomen is soft. Musculoskeletal:       General: Normal range of motion.    Cervical back: Normal range of motion and neck supple.    Right lower leg: Edema (3+) present.    Left lower leg: Edema (3+) present. Skin:   General: Skin is warm and dry. Neurological:    General: No focal deficit present.    Mental Status: He is oriented to person, place, and time. Psychiatric:       Mood and Affect: Mood is depressed.  Labs: I have reviewed the patient's labs within the last 24 hrs.Diagnostics:CXRResult Date: 10/22/2021XR CHEST PA AND LATERAL INDICATION: SHORTNESS OF BREATH. History of bladder cancer. COMPARISON: XR CHEST PA OR AP 2021-Oct-01 FINDINGS:   The cardiomediastinal silhouette is stably enlarged. Right-sided Port-A-Cath terminates in similar position. There is chronic fullness of the pulmonary vasculature with newly worsened interstitial prominence predominantly involving the mid and lower lung zones. Findings are most compatible with central vascular congestion and a component of mild pulmonary edema. Superposed infectious process would be difficult to exclude in this setting. Innumerable bilateral metastatic lung nodules are again noted, better assessed on prior [place] (05/17/2020). There is no pneumothorax or pleural effusion. There is no acute osseous injury. Cardiomegaly and central vascular congestion with a suggested component of mild pulmonary edema, consistent with a fluid overload picture. Superposed infectious process would be difficult to exclude radiographically in this setting and should be correlated for clinically. Metastatic lung lesions are again noted, better assessed on prior [place] scan of 05/17/2020. Continued follow-up per oncological protocols is recommended. Reported And Signed By: Lavinia Sharps, MD  [facility] Radiology and Biomedical ImagingCTA Chest (PE) w IV ContrastResult Date: 10/22/2021CTA CHEST (PE) W IV CONTRAST Performed On 06/17/2020 2:21 AM. CLINICAL HISTORY: Shortness of breath, pulmonary embolism suspected, high probability, positive d-dimer. TECHNIQUE: A volumetric acquisition of the chest was obtained from the thoracic inlet to the upper abdomen during dynamic administration of intravenous contrast (120 cc Omnipaque 350) according to the PE protocol. Multiplanar 3D MIP images were provided. COMPARISON: [place] chest 05/17/2020 LIMITATIONS: Evaluation is mildly limited secondary to contrast bolus timing and mixing artifact. FINDINGS: CTA: There is no evidence of intraluminal filling defect in the pulmonary arteries to suggest pulmonary embolism. The cardiac chambers appear normal in size. The RV to LV ratio is less than 1. There is no reflux of contrast into the IVC or hepatic veins. There is no pericardial effusion. Aorta is normal in caliber. The main pulmonary artery is enlarged, nonspecific but may suggest pulmonary artery hypertension. Lungs/Airways/Pleura: Again demonstrated are innumerable pulmonary metastases, many of which have increased in size compared to the prior study (05/17/2020). The largest representative lesions include a 2.7 cm right posterior basilar nodule (series 2, image 423), previously 2.3 cm, a 2.5 cm left posterior base nodule (series 2, image 390), previously 2.2 cm, and a 2.0 cm left upper lobe nodule (series 2, image 259), previously 1.6 cm. Many of the lesions are noted to be cavitating, similar to prior. There is upper lobe predominant centrilobular emphysema. A 1.9 cm cyst is  noted in the left upper lobe. There is diffuse bronchial wall thickening. The central airways are patent. No large pleural effusion or pneumothorax. Mediastinum/Lymph nodes: Scattered mediastinal lymphadenopathy is noted, the largest including several 1.2 cm left superior mediastinal lymph nodes (series 2, images 48 and 89) and a 2.0 cm right paratracheal node (series 2, image 176). A 1.3 cm right hilar lymph node (series 2, image 276) is noted. Left axillary lymphadenopathy measures up to 1.4 cm (series 2, image 71). These are overall similar to the prior study. Upper Abdomen: Limited evaluation of the upper abdomen is unremarkable. Bones and Soft Tissues: No aggressive osseous lesions. Degenerative changes of the thoracolumbar spine are noted. 1.  No evidence of pulmonary embolism, subject to limitations described above. 2.  Interval enlargement of multiple lung metastases with stable thoracic lymphadenopathy, consistent with an overall progression of patient's metastatic disease. Continued follow-up per oncological protocols is recommended. 3.  Main pulmonary artery is enlarged, nonspecific but may suggest underlying pulmonary artery hypertension. These findings were discussed with Dr. Kem Boroughs at 4:05 AM on 06/17/2020. Reported And Signed By: Lavinia Sharps, MD  [facility] Radiology and Biomedical ImagingECG/Tele Events: I have reviewed the patient's ECG as resulted in the EMR.Assessment: Paul Hunter is a 69 y.o. 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 The etiology of his rather abrupt decompensation is not entirely clear, but suspect there is a contribution of his ongoing steroid use and perhaps spread of his pulmonary mets resulting in increased pulmonary edema. He has responded well to aggressive IV diuresis.Principal Problem:  Shortness of breath  SNOMED [place](R): DYSPNEA  Plan: # acute on chronic diastolic HF- appreciate cardiology input- continue lasix 80 mg IV BID for goal net negative >2 L daily- continue home metoprolol, lisinopril, ASA, statin- continue prednisone taper for myocarditis, today 110 mg daily with bactrim PPX- limited TTE to eval EF# metastatic bladder cancer- continue oxybutinin, tamsulosin- continue oxycontin 10 mg BID and oxycodone Q3H prn# depression- continue lexapro, gabapentin- consider palliative care consult for emotional distress# GERD- continue PPIVTE pharmacologic prophylaxis: LMW heparinI have discussed the patients code status:Yeswith:patientNotifications: PCP: Name), No Pcp (Do Not Change None    I have personally notified the patient's primary care provider of this admission. Yes  Family was notified of this admission. YesI have personally discussed the plan with the patient and/or family. YesI have reviewed the plan of care with the bedside nurse, including an emphasis on the most important aspects of care for the next 12 hours: yesAdvance Care Planning Advance Care Planning Patient has a living will: yesPatient has healthcare representative/proxy: yes Signed:Saivon Prowse, MD, PhDNight Hospitalist, [facility] on [facility] HeartBeat10/22/2021 7:02 AMAddendum: None

## 2020-06-17 NOTE — Consults
Cardiovascular Medicine Consultation    Name: Paul Hunter Attending: Cyndia Diver, *    DOB: [birth year] Outpatient Cardiologist: Ferne Reus   MRN: [id number] PCP: Name), No Pcp (Do Not Change MD   Admit Date: 06/16/2020 History provided by: the patient     Hospitalization Details     Paul Hunter is a 69 y.o. 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    Reason for Consultation: evaluation for myocarditis     History:     He was in his USOH until Thursday night when he acutely developed dyspnea and orthopnea. He had had swelling in his legs for about a week and had noted some weight gain, but realized it was especially. He was also not able to urinate, despite continuing to take his furosemide. He has not had PND, chest pain or palpitations. He called 911 and was brought to the ED by EMS. En route, he was given DuoNebs and placed on oxygen.     Of note, he has a history of NSCLC and transferred care from [place] to [place] for his stage IV NSCLC. He was started on carbo/taxo/pembro 11/2019 for 2 cycles. He was referred to cardio-oncology due to progressive DOE and poor functional capacity. 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 ICI therapy was held. He was started on prednisone with plan to taper by 10 mg per week. When he was seen in 05/09/20 he appeared volume overloaded. He has since been hospitalized 4 times: 9/13-9/16/21 for ADHF and hematuria, 9/23-9/24/21 for urinary retention, 9/25-9/29 for urinary retention and pain (found to have 3x4 cm papillary tumor in bladder with post-op hematuria), and 10/1-10/4 for pre-syncope thought to be 2/2 hypovolemia. During the last admission, Cardiology was consulted for troponin elevation and he was given IV methylprednisone 1 g for 3 days with plan to resume prednisone taper starting from 130 mg and decreasing by 10 mg weekly. His metoprolol, lisinopril and furosemide were resumed on discharge. ASA was resumed as well, and per Urology clopidogrel was held due to persistent hematuria and concern for GI bleed (evaluation for this deferred as he only had 2 episodes of dark stools). A repeat TTE showed LVEF 66%. He was last seen in Cardio Oncology clinic 06/03/20 where metoprolol, lisinopril, furosemide were continued, as was prednisone 130 mg daily. He was not thought to be an appropriate candidate for LHC. Per the patient, he has now tapered prednisone to 110 mg daily.     In the ED, vitals were temp 36.1C, HR 73, RR 20, BP 130/79, SpO2 97% on 2L NC. Labs were notable for Cr 1.56 (recent b/l 1.5), BNP 1369 (similar to earlier this month) and troponin T 0.04. WBC was 23.89 and Hgb was 10.4, also stable. CXR with cardiomegaly and mild vascular congestion, with metastatic lung lesions. CTA chest did not show PE, with interval enlargement of lung mets. ECG was nonischemic. He received furosemide 80 mg IV x1 and was admitted to Medicine. At this time he was feeling much better. Foley was placed in ED, with robust >1L urine  output.    Active Problems:  Patient Active Problem List    Diagnosis    ? Anxiety    ? Vitamin D deficiency    ? Chest pain, rule out acute myocardial infarction    ? Mediastinal lymphadenopathy    ? Bladder cancer (HC Code) (HC CODE)    ? Hemorrhagic cystitis    ? Urinary catheter complication, initial encounter (HC Code) (HC CODE)    ? DOE (dyspnea on exertion)    ? Pulmonary emphysema, unspecified emphysema type (HC Code) (HC CODE)    ? Dyspnea on exertion    ? Sleep apnea, unspecified type    ? Squamous cell carcinoma of lung, stage IV (HC Code) (HC CODE)    ? Spinal stenosis    ? GERD (gastroesophageal reflux disease)    ? CAD (coronary artery disease)    ? Diabetes mellitus (HC Code) (HC CODE)    ? Hyperlipidemia    ? Hypertension        Review of Systems:    As above    Allergies: No Known Allergies    Past Medical History:   Diagnosis Date   ? Anxiety    ? CAD (coronary artery disease)    ? Diabetes mellitus (HC Code) (HC CODE)    ? Difficult intubation 05/22/2020    Patient with significant soft tissue in oropharynx. Glidescope intubation revealed anterior cords with floppy soft tissue obstruction. Patient will require video laryngoscopy for airway management going forward.   ? GERD (gastroesophageal reflux disease)    ? Hyperlipidemia    ? Hypertension    ? Hypertrophy of prostate without urinary obstruction and other lower urinary tract symptoms (LUTS)    ? Other testicular hypofunction    ? Spinal stenosis 09/28/2018   ? Tobacco abuse    ? Vitamin D deficiency     Past Surgical History:   Procedure Laterality Date   ? CAROTID STENT  08/28/2003   ? CHOLECYSTECTOMY  11/07/2016   ? KNEE SURGERY      torn meniscus   ? LAMINECTOMY  10/07/2008   ? LUMBAR DISC SURGERY      herniated disc      Social History     Tobacco Use   ? Smoking status: Current Every Day Smoker     Types: Cigarettes   Substance Use Topics   ? Alcohol use: Not on file    No family history on file.     Medications Prior to Admission:  (Not in a hospital admission)      Current Meds:  No current facility-administered medications for this encounter.      Infusions:    PRN Meds:      Vitals:  Vitals:    06/16/20 2308 06/16/20 2317 06/17/20 0006   BP: 130/79 120/78 (!) 106/51   Pulse: 73 86 62   Resp: 20 (!) 22 18   Temp: (!) 96.9 ?F (36.1 ?C) 97.5 ?F (36.4 ?C) 97.7 ?F (36.5 ?C)   TempSrc: Temporal Oral Oral   SpO2: 97% 97% 97%   Weight: 136.1 kg     Height: 5' 10 (1.778 m)         I/O's last 24 hours:  No intake or output data in the 24 hours ending 06/17/20 0102 Weights:  Admission Weight: 136.1 kg (300 lb)  Last 3 Weights    06/16/20 2308   Weight (lbs): 300 Lbs.        Physical exam:  Constitutional:  NAD  HEENT: EOMI, no scleral icterus, MMM, no oral lesions  CV: RRR, S1, S2, no m/r/g, JVP elevated, 2-3+ pitting LE edema up to flanks, no carotid bruits, distal pulses 2+  Pulm: CTAB, no wheezes, rhonchi or crackles  Abd: soft, NT, +distension, active BS, no rebound or guarding  Skin: no rashes  Neuro: A&Ox3, no focal deficits  Psych: euthymic mood and affect     CBC  Recent Labs   Lab 06/16/20  1654 06/16/20  2321 06/16/20  2324   WBC 26.1* 23.9*  --    HGB 10.2* 10.4*  --    HCT 31.1* 31.2* 30.0*   PLT 268 261  --         LFTs  Recent Labs   Lab 06/16/20  1654   ALT 34   AST 13   ALKPHOS 118   BILITOT 0.2       Coags  Recent Labs   Lab 06/16/20  2321   INR 0.94        BMP  Recent Labs   Lab 06/16/20  1654 06/16/20  2324   NA 140  --    K 4.6 4.3   CL 101  --    CO2 26  --    BUN 25*  --    CREATININE 1.62* 1.6*   GLU 164* 167*   CALCIUM 8.5*  --        Cardiac Biomarkers  Recent Labs   Lab 06/16/20  1654   TROPONINT 0.03*     Lab Results   Component Value Date    TROPONINI 0.17 (H) 06/16/2020       PBNP  No components found for: BTYPENATRIUR Lipids  Lab Results   Component Value Date    CHOL 141 05/11/2020    HDL 39 (L) 05/11/2020    LDL 75 05/11/2020    TRIG 409 05/11/2020       A1C  Lab Results   Component Value Date    HGBA1C 5.6 04/08/2020        Cardiac Diagnostics:  Imaging: CXR    Result Date: 06/17/2020  Cardiomegaly and central vascular congestion with a suggested component of mild pulmonary edema, consistent with a fluid overload picture. Superposed infectious process would be difficult to exclude radiographically in this setting and should be correlated for clinically. Metastatic lung lesions are again noted, better assessed on prior [place] scan of 05/17/2020. Continued follow-up per oncological protocols is recommended. Reported And Signed By: Lavinia Sharps, MD [facility] Radiology and Biomedical Imaging    Echo:    Results for orders placed or performed during the hospital encounter of 05/27/20   Echo 2D Ltd w Doppler and CFI if Ind Image Enhancement and or 3D   Result Value Ref Range    Reported Biplane EF% 66 %    Narrative     * Limited study for left ventricular function.  * No regional wall motion abnormalities.  Normal left ventricular systolic function.  LVEF calculated by biplane Simpson's was 66%.  * Normal right ventricular systolic function.  * No evidence of pericardial effusion.  * Compared with the prior study, dated 05/11/2020, there is no significant change.   Results for orders placed or performed during the hospital encounter of 05/09/20   Echo 2D Complete w Doppler and CFI if Ind Image Enhancement 3D and or bubbles   Result Value Ref Range    Reported Biplane EF% 68 %    Narrative     *  Normal left ventricular size, systolic function and wall motion. Moderate concentric left ventricular hypertrophy.  LVEF calculated by biplane Simpson's was 68%.  Abnormal tissue Doppler suggestive of abnormal diastolic function.  The inferior and inferoseptal wall are hypokinetic  * Normal right ventricular cavity size, systolic function and wall motion.  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  Previous study ascending aorta size was 4.4cm  * Inferior vena cava was not well visualized.  * Compared with the prior study, dated 02/08/2020, there are changes noted.  Wall motion in inferior and inferoseptal walls noted on current study, though compared to prior image, no significant change in wall motion abnormality.     Stress Test (pharmacologic, exercise): No results found for this or any previous visit.    Exercise Stress Test: na  Cardiac Cath: not recent  ECG: sinus rhythm with PAC  Tele Events: sinus with PVCs    Consult Synopsis & Recommendations     Impression: EDGAR REISZ is a 69 y.o. 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    He appears grossly volume overloaded, and his presentation is most c/w ADHF. It is unclear what the trigger is this time, though certainly the high dose steroids put him at greater risk for retaining fluid. It is also possible that these could be continued symptoms related to myocarditis though troponin is notably only 0.04. Contribution from ischemia cannot be ruled out either.     Recommendations:  - Continue furosemide 80 mg IV 1 or 2 times daily to achieve goal net negative 2L per day   - Strict I/O, high goal lytes (goal K >4, Mg >2), BID BMP, daily standing weights   - Obtain formal limited TTE to assess LVEF and wall motion   - Check daily troponin for now   - CM: continue home metoprolol and lisinopril  - CAD: continue ASA 81 mg daily and home statin  - Continue prednisone taper (currently on 110 mg daily)  - Will discuss with Cardio-Oncology    Thank you for allowing [place] to participate in the care of Maximilliano Kersh Tanney. Please do not hesitate to contact us with any questions.    PRELIMINARY NOTE - FINAL RECOMMENDATIONS PENDING ATTENDING ADDENDUM.    Electronically signed:  Conley Rolls, MD  PGY-4  Cardiology Fellow  [facility]: [phone]

## 2020-06-17 NOTE — Progress Notes
Cardio-oncology progress note43 y.o.?M?with active tobacco use (1ppd now,?1-2ppd for 50 years),?HTN,?OSA on CPAP,?history of CAD s/p MIx2?1996 (1 stent at [facility] area), 2014 ?(3 stents in [facility] at [facility]) and PCI?x4?(on review of [place] chest, likely RCA and LAD stents), HFrEF??LVEF 39% (OSH spect 09/2019), inferior wall hypokinesis with?rega?stress test 10/09/19 showing inferior infarct with recovery of LVEF??with recent TTE 01/2020?showing LVEF of 62%, moderate LVH, no WMA on 02/08/20,?transfered care from [place] to [place] for his presumed stage IV NSCLC started on?Carbo/Taxol/Pembro 11/28/19?s/p 4 cycles, most recent 03/10/20 referred to cardio-onc to est care given hx of CAD and MI.??Pt with poor functional capacity, progressive DOE over the past year, even prior to chemotherapy, slightly different sx from prior MI. Euvolemic on exam in cardio onc outpt visit.?[place] chest?02/2020?showed ascending dilated aorta of 4.5cm?and dilated PA to 4cm. 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 Pt underwent PET stress 05/06/20 that was high risk, w/+TID, 2 large reversible perfusion defects, LVEF of 40% at rest, drop to 36% on stress with [place] chest showing possible progression of disease. Plavix started cautiously without bolus due to recent hematuria complaints to see if he would tolerate DAPT. Since then, patient has had 4 hospitalizations: 9/13-9/16/21 for HF exacerbation and hematuria for which plavix was held, 9/23-9/24/21 for urinary retention, 9/25-9/29 for urinary retention and pain (with cystoscopy and TURBT 9/26 revealing a 3x4 cm papillary tumor in bladder with post-op hematuria and path confirmed metastatic urothelial carcinoma), and 10/1-10/4 for pre-syncope thought to be 2/2 hypovolemia. During 10/1-10/4 hospitalization, Cardiology was consulted for troponin elevation and he was given IV methylprednisone 1 g for 3 days with resumption of prednisone taper starting from 130 mg and decreasing by 10 mg weekly. His metoprolol, lisinopril and furosemide were resumed on discharge. ASA was resumed as well, and per Urology clopidogrel was held due to persistent hematuria and concern for GI bleed (evaluation for this deferred as he only had 2 episodes of dark stools). A repeat TTE 05/29/20 showed LVEF 66%. He was last seen in Cardio Oncology clinic 06/03/20 where metoprolol, lisinopril, furosemide were continued, as was prednisone 130 mg daily. He was not thought to be an appropriate candidate for LHC given bleeding issues in a nonurgent/emergent settting.  He has now tapered prednisone to 110 mg daily.  Plans to start enfortumab, a nectin 4 antagonist for his metastatic urothelial carcinoma next week. Of note, on review of path, it is possible that what was thought to be lung cancer, is actually metastatic urothelial carcinoma per oncology.On f/u with oncology, pt had a noted 30lb weight gain (some could be due to steroids), no new O2 requirement at rest (no O2 done with exertion), but worsening SOB/DOE and troponin T + 0.03-0.04, CTA negative for PE, started on IV diuresis and planned admission on telemetry for diuresis with cards, onc on consult. Repeat echo shows interval reduction in RVEF, RV dilation, LVEF visually estimated to be 55-60%?Vitals:  06/17/20 0607 06/17/20 0749 06/17/20 0835 06/17/20 1100 BP: (!) 116/51 103/86 (!) 142/109 118/61 Pulse: 78 88 (!) 91 68 Resp: 16 16  16  Temp:  98.6 ?F (37 ?C)  98.2 ?F (36.8 ?C) TempSrc:  Oral  Oral SpO2: 96% 97%  95% Weight:  Height:     BMI Readings from Last 3 Encounters: 06/16/20 43.05 kg/m? 06/13/20 42.04 kg/m? 06/09/20 42.37 kg/m? Wt: 06/16/20 136.1 kg 06/13/20 (!) 140.6 kg 06/09/20 (!) 141.7 kg 06/02/20 (!) 136.9 kg 05/27/20 (!) 138.3 kg 05/21/20 (!) 139 kg 05/22/2020 cystoscopy w/?clot evacuation, TURBT, L retrograde pyelogram, and noted 3x4cm?papillary tumor involving L trigone and L lateral bladder wall. Pathology: BLADDER, LEFT LATERAL WALL AND TRIGONE, TRANSURETHRAL RESECTION: ? ? ?- INVASIVE PAPILLARY UROTHELIAL CARCINOMA, HIGH GRADE ? ? ?- TUMOR INVADES INTO LAMINA PROPRIA ? ? ?- MUSCULARIS PROPRIA IDENTIFIED BUT NOT INVOLVED BY TUMOR 2. LEFT URETER TUMOR, BIOPSY: ? ? ?- SUSPICIOUS FOR HIGH GRADE UROTHELIAL CARCINOMA09/27/2021 repeat cystoscopy for gross hematuria that developed after 05/22/2020 cystoscopy and biopsy. He had clot evacuation and fulguration; found to have large organized clot w/in bladder and arterial bleeding immediately lateral to L ureteral orifice s/p cauterization?w/ subsequent improvement in hematuria09/28/2021 EBUSLUNG, RIGHT LOWER, FINE NEEDLE ASPIRATION/WASH: - SPECIMEN IS ADEQUATE FOR INTERPRETATION. - POSITIVE FOR MALIGNANT CELLS. - CONSISTENT WITH METASTASIS. - SEE NOTE. Note: Tumor cells are positive for p40 and Gata-3. They are negative for TTF-1. A clinical history of multiple lung nodules is noted. The morphologic features of this tumor are similar to the bladder biopsy ([id number]), reviewed for comparison purposes only. Based on the overall features metastatic carcinoma from known urothelial primary is favored. Clinical and imaging correlation is recommendedECGNsr, q wave inferiorly, pacTTE 06/17/2020* Study limited by poor acoustic windows.* Suboptimal visualization of the left ventricle, but it appears to be normal size with normal function.  No regional wall motion abnormalities.  LVEF estimated by visual assessment was between 55-60%.* Suboptimal visualization of the right ventricle, but it appears to be dilated with mildly decreased function.* There is beat to beat variability in the LV and RV function due to frequent ventricular ectopy.* IVC diameter < 2.1 cm that collapses > 50% with a sniff suggests normal RAP (0-5 mmHg, mean 3 mmHg).* No significant pericardial effusion.* Compared with the prior study, dated 05/29/2020, there are changes noted.  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 Given his active bladder malignancy with gross hematuria, will defer cath for now. Workup and monitoring recommended as below:PlanTrend serial troponin and 12 lead ecg dailyStrict Is and OsDaily weightsContinue diuresis per primary team/general cardiology recsGet repeat cMRI while inpt once pt is more euvolemic and can hold his breathContinue his current steroid dose of 110mg /dayContinue asa 81, atorva 80/or equivalent, lisinopril 2.5mg Increase his metop succinate 12.5 --> 25mg  dailyMay consider adding additional agent (s) after/depending on cMRI results: may aim to give 1000mg  BID PO myocophenolate* in addition to steroids, should pt still have significant findings for myocarditis since pt has been bolused with solumedrol already and the steroids are likely contributing to some of his fluid retention.*Mycophenolate mofetil: Oral 1 g twice daily in combination with glucocorticoids (ASCO [Brahmer 2018]; [place] Alar 2019).[email or web address] Oretha Milch MD PhDClinical Cardio-oncology fellow

## 2020-06-17 NOTE — Other
Evaluated in AMStates that feels much better, with DOE, denies any chest pain, F/C, N/V, abdominal painNAD, decreased breath sounds, RRR, abdomen soft, NT/ND, 3+ b/l peripheral edema- cards, cards/onc recs- continue lasix 80 IV BID- TTE with dilated RV with reduced function- plan for inpatient cMRI

## 2020-06-17 NOTE — ED Notes
6:55 PM Floor Handoff Telemetry: 	[x]  Yes		[]  NoCode Status:   [x]  Full		[]  DNR		[]  DNI		Other (specify):Safety Precautions: [x]  None	[]  Sitter   []  Restraints	[]  Suicidal	[]  Fall Risk	Other (specify):Mentation/Orientation:	 A&O (Self, person, place, time) x  4        	 Disoriented to:                    	 Deficits: []  Hearing impaired	[]  Blind  []  Nonverbal	 []  Mental retardationOxygenation Upon Admission: []  RA	[x]  NC	[]  Venti  []  Simple Mask []  Other	Baseline O2 Status? []  Yes	[x]  NoAmbulation: [x]  Independent	[]  Cane   []  Walker	[]  Wheelchair	[]  Bedbound		[]  Hemiplegic	[]  Paraplegic	[]  QuadraplegicEliminiation: []  Independent	[]  Commode	[]  Bedpan/Urinal  []  Straight Cath [x]  Foley cath			[]  Urostomy	[]  Colostomy	Other (specify):Diarrhea/Loose stool : []  1x within 24h  []  2x within 24h  []  3x within 24h  []  None 	C.Diff Order: 	[]  Ordered- needs to be collected             []  Collected-sent to lab             []  Resulted - Negative C.Diff             []  Resulted - Positive C.Diff[]  Not Ordered   []  N/ASkin Alteration: []  Pressure Injury []  Wound []  None []  Skin not assessedDiet: [x]  Regular/No order placed	[]  NPO		Other (specify):IV Access: [x]  PIV   []  PICC    []  Port    [] Central line    []  A-line    Other (specify)IVF/GTT Running Upon ED Departure? [x]  No	    []  Yes (specify):Outstanding Meds/Treatments/Tests:Patient Belongings:Are the belongings documented?          []  No	    []  YesIs someone taking belongings home?   [x]  No     []  Yes  Who? (specify)                                   ED RN and Contact number/MHB #:           Cala Bradford

## 2020-06-17 NOTE — ED Notes
7:12 AM report received and care of pt assumed. Here for SOB, CA mets to lungs. Elevated porbnp and received lasix. +troponins, MD aware.

## 2020-06-18 LAB — BASIC METABOLIC PANEL
BKR ANION GAP: 12 (ref 7–17)
BKR BLOOD UREA NITROGEN: 35 mg/dL — ABNORMAL HIGH (ref 8–23)
BKR BUN / CREAT RATIO: 19.8 (ref 8.0–23.0)
BKR CALCIUM: 8.7 mg/dL — ABNORMAL LOW (ref 8.8–10.2)
BKR CHLORIDE: 101 mmol/L (ref 98–107)
BKR CO2: 29 mmol/L — ABNORMAL HIGH (ref 20–30)
BKR CREATININE: 1.77 mg/dL — ABNORMAL HIGH (ref 0.40–1.30)
BKR EGFR (AFR AMER): 47 mL/min/{1.73_m2} — ABNORMAL LOW (ref >60)
BKR EGFR (NON AFRICAN AMERICAN): 39 mL/min/1.73m2 (ref >60)
BKR GLUCOSE: 102 mg/dL — ABNORMAL HIGH (ref 70–100)
BKR POTASSIUM: 3.8 mmol/L (ref 3.3–5.3)
BKR SODIUM: 142 mmol/L — ABNORMAL LOW (ref 136–144)

## 2020-06-18 LAB — CBC WITH AUTO DIFFERENTIAL
BKR WAM ABSOLUTE IMMATURE GRANULOCYTES: 0.3 x 1000/??L (ref 0.0–0.3)
BKR WAM ABSOLUTE LYMPHOCYTE COUNT: 1.8 x 1000/??L (ref 1.0–4.0)
BKR WAM ABSOLUTE NRBC: 0 x 1000/??L (ref 0.0–0.0)
BKR WAM ANALYZER ANC: 18.2 x 1000/ÂµL — ABNORMAL HIGH (ref 1.0–11.0)
BKR WAM BASOPHIL ABSOLUTE COUNT: 0 x 1000/??L (ref 0.0–0.0)
BKR WAM BASOPHILS: 0.1 % (ref 0.0–4.0)
BKR WAM EOSINOPHIL ABSOLUTE COUNT: 0 x 1000/??L (ref 0.0–1.0)
BKR WAM EOSINOPHILS: 0.1 % (ref 0.0–7.0)
BKR WAM HEMATOCRIT: 29.9 % — ABNORMAL LOW (ref 37.0–52.0)
BKR WAM HEMOGLOBIN: 10.3 g/dL — ABNORMAL LOW (ref 12.0–18.0)
BKR WAM IMMATURE GRANULOCYTES: 1.5 % (ref 0.0–3.0)
BKR WAM LYMPHOCYTES: 8.5 % — ABNORMAL LOW (ref 8.0–49.0)
BKR WAM MCH (PG): 32.7 pg — ABNORMAL HIGH (ref 27.0–31.0)
BKR WAM MCHC: 34.4 g/dL (ref 31.0–36.0)
BKR WAM MCV: 94.9 fL — ABNORMAL HIGH (ref 78.0–94.0)
BKR WAM MONOCYTE ABSOLUTE COUNT: 0.7 x 1000/ÂµL (ref 0.0–2.0)
BKR WAM MONOCYTES: 3.3 % — ABNORMAL LOW (ref 4.0–15.0)
BKR WAM MPV: 10.1 fL (ref 6.0–11.0)
BKR WAM NEUTROPHILS: 86.5 % — ABNORMAL HIGH (ref 37.0–84.0)
BKR WAM NUCLEATED RED BLOOD CELLS: 0 % (ref 0.0–1.0)
BKR WAM PLATELETS: 249 x1000/??L (ref 140–440)
BKR WAM RDW-CV: 15.8 % — ABNORMAL HIGH (ref 11.5–14.5)
BKR WAM RED BLOOD CELL COUNT: 3.2 M/??L — ABNORMAL LOW (ref 3.8–5.9)
BKR WAM WHITE BLOOD CELL COUNT: 21.1 x1000/??L — ABNORMAL HIGH (ref 4.0–10.0)

## 2020-06-18 LAB — TROPONIN T     (Q): BKR TROPONIN T: 0.06 ng/mL — ABNORMAL HIGH (ref <0.01)

## 2020-06-18 LAB — URINE CULTURE: BKR URINE CULTURE, ROUTINE: NO GROWTH

## 2020-06-18 MED ORDER — NICOTINE (POLACRILEX) 2 MG BUCCAL MINI LOZENGE
2 mg | ORAL | Status: DC | PRN
Start: 2020-06-18 — End: 2020-06-24

## 2020-06-18 MED ORDER — FUROSEMIDE 10 MG/ML INJECTION SOLUTION
10 mg/mL | Freq: Two times a day (BID) | INTRAVENOUS | Status: DC
Start: 2020-06-18 — End: 2020-06-20
  Administered 2020-06-18 – 2020-06-19 (×3): 10 mL via INTRAVENOUS

## 2020-06-18 MED ORDER — DEXTROSE 50 % IN WATER (D50W) INTRAVENOUS SYRINGE
INTRAVENOUS | Status: DC | PRN
Start: 2020-06-18 — End: 2020-06-18

## 2020-06-18 MED ORDER — INSULIN LISPRO 100 UNIT/ML (SLIDING SCALE)
100 unit/mL | Freq: Three times a day (TID) | SUBCUTANEOUS | Status: DC
Start: 2020-06-18 — End: 2020-06-18

## 2020-06-18 NOTE — Progress Notes
[facility]    [facility]     Medicine Progress Note    Attending Provider: Dr. Raford Pitcher    Subjective:     Interim History: No acute events overnight. VSS.    I did not personally interview or examine the patient.  All recommendations were made based off of chart review and discussion with my attending.    Review of Allergies/Meds/Hx:     I have reviewed the patient's: allergies, current scheduled medications, current infusions, current prn medications and past medical history    Objective:     Vitals:  I have reviewed the patient's current vital signs as documented in the patient's EMR.   and Last 24 hours: Temp:  [98 ?F (36.7 ?C)-98.5 ?F (36.9 ?C)] 98.4 ?F (36.9 ?C)  Pulse:  [63-98] 98  Resp:  [16] 16  BP: (105-132)/(62-74) 115/68  SpO2:  [93 %-97 %] 93 %    I/O's:  I have reviewed the patient's current I&O's as documented in the EMR.    Gross Totals (Last 24 hours) at 06/18/2020 1356  Last data filed at 06/18/2020 1247  Intake 690 ml   Output 2800 ml   Net -2110 ml       Procedures:  None     Physical Exam: per attending    Nursing note and vitals reviewed.    Labs:  Recent Labs   Lab   0000 06/16/20  1654 06/16/20  1654 06/16/20  2321 06/16/20  2324 06/17/20  0654 06/17/20  0800 06/17/20  1333 06/17/20  1852 06/17/20  2017 06/18/20  0607 06/18/20  0816 06/18/20  1212   NA  --  140  --  137  --   --   --  139  --   --  142  --   --    K  --  4.6   < > 4.7 4.3  --   --  4.0  --   --  3.8  --   --    CL  --  101  --  101  --   --   --  98  --   --  101  --   --    CO2  --  26  --   --   --   --   --  26  --   --  29  --   --    BUN  --  25*  --  29*  --   --   --  30*  --   --  35*  --   --    CREATININE  --  1.62*   < > 1.56* 1.6*  --   --  1.61*  --   --  1.77*  --   --    GLU   < > 164*   < > 170* 167*  --    < > 158*   < > 226* 102* 91 147*   CALCIUM  --  8.5*  --  8.7*  --   --   --  8.7*  --   --  8.7*  --   --    MG  --   --   --   --   --  2.0  --   --   --   --   --   --   -- < > = values in this interval not displayed.  Recent Labs   Lab 06/16/20  1654 06/16/20  1654 06/16/20  2321 06/16/20  2324 06/17/20  1333 06/18/20  0607   WBC 26.1*  --  23.9*  --  27.6* 21.1*   HGB 10.2*  --  10.4*  --  11.1* 10.3*   HCT 31.1*   < > 31.2* 30.0* 33.3* 29.9*   PLT 268  --  261  --  244 249   MCV 99.7*  --  94.5*  --  96.8* 94.9*   NEUTROPHILS 93.5*  --  91.0*  --  92.7* 86.5*   MONOCYTES 1.3*  --  2.5*  --  2.1* 3.3*    < > = values in this interval not displayed.     Recent Labs   Lab 06/16/20  2321   LABPROT 10.3   INR 0.94   DDIMER 1.78*     Recent Labs   Lab 06/16/20  1654 06/16/20  2321 06/17/20  1333   ALKPHOS 118 128* 125*   BILITOT 0.2 <0.2 0.3   BILIDIR  --  <0.2 <0.2   PROT 5.2* 5.7*  --    ALT 34 33 33   AST 13 19 21     No results for input(s): AMYLASE, LIPASE in the last 168 hours.    No results for input(s): PHART, PCO2ART, PO2ART, O2SATART, BEART, HCO3ART in the last 168 hours.  Recent Labs   Lab 06/16/20  2321 06/17/20  0259 06/17/20  1333 06/18/20  0607   TROPONINT 0.03* 0.04* 0.05* 0.06*         Diagnostics:  No new radiology.    ECG/Tele Events:   No ECG ordered today    Assessment:     Assessment:69 y.o. 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 The etiology of his rather abrupt decompensation is not entirely clear, but suspect there is a contribution of his ongoing steroid use and perhaps spread of his pulmonary mets resulting in increased pulmonary edema. He has responded well to aggressive IV diuresis.    Diastolic heart failure  -appreciate cards recs  -IV lasix to 60 mg BID tomorrow  -goal 1.5-2L output  -compression stockings ordered  -cont I&Os  -cardiac MRI pending for Monday 10/25  -replete K >4 and Mg >2  -cont daily EKG and troponin  -cont metoprolol, lisinopril, ASA, statin  -cont pred taper for myocarditis (110mg  for 7 days then taper 10 100mg ), cont bactrim ppx  -limited TTE to eval EF    Metastatic bladder cancer  -cont flomax and oxybutinin  -cont oxycontin10 mg BID  -cont oxycodone Q3H prn    Depression  -cont lexapro  -cont gabapentin    GERD   -cont PPI    DM  -cont sliding scale inpt  -A1c pending    Diet: cardiac/diabetic  Code Status Full Code/ACLS   DVT Prophylaxis lovenox   ADMIT MED REC Pending pharmacist review   PCP Name), No Pcp (Do Not Change None   Emergency  Extended Emergency Contact Information  Primary Emergency Contact: AUSTIN,[name]  Home Phone: [phone]   Disposition Pending diuresis         Signed:  Milton Ferguson, PA  MHB: [phone]  after 5:00 PM please page [phone] for [facility] or [phone] for [facility]    06/18/2020  1:52 PM

## 2020-06-18 NOTE — Progress Notes
Cardiovascular Medicine Consultation Follow Up NoteName: Paul Hunter Primary Team Attending: Suella Grove, MD  DOB: [birth year] Outpatient Cardiologist: Dr Ferne Reus MRN: [id number] PCP: Name), No Pcp (Do Not Change  Admit Date: 06/16/2020  Interval Events Patient feels his breathing is significantly better. His legs are still swollen. He was net negative 3410 cc. Cr 1.61.Review of telemetry shows PVCs, otherwise NSREKG shows PAC, otherwise NSRCardiac Studies Echo 2D Complete w Doppler and CFI if Ind Image Enhancement 3D and or bubblesResult Date: 05/11/2020 * Normal left ventricular size, systolic function and wall motion. Moderate concentric left ventricular hypertrophy.  LVEF calculated by biplane Simpson's was 68%.  Abnormal tissue Doppler suggestive of abnormal diastolic function.  The inferior and inferoseptal wall are hypokinetic * Normal right ventricular cavity size, systolic function and wall motion. No significant valvular abnormalities * Dilated sinuses of Valsalva with a diameter of 4.2 cm and dilated ascending aorta with a diameter of 4.5 cm.  Previous study ascending aorta size was 4.4cm * Inferior vena cava was not well visualized. * Compared with the prior study, dated 02/08/2020, there are changes noted.  Wall motion in inferior and inferoseptal walls noted on current study, though compared to prior image, no significant change in wall motion abnormality.Echo 2D Complete w Doppler and CFI if Ind Image Enhancement 3D and or bubblesResult Date: 02/08/2020 * Normal left ventricular size and systolic function. Moderate concentric left ventricular hypertrophy.  No regional wall motion abnormalities.  LVEF calculated by biplane Simpson's was 62%. * Longitudinal strain did not track well. No thrombus visualized in the left ventricle * Normal right ventricular cavity size and systolic function. * Left atrium is mildly dilated.  No interatrial shunt by color Doppler.  Right atrium is normal in size. * No significant valvular abnormalities. * Dilated sinuses of Valsalva with a diameter of 4.0 cm and dilated ascending aorta with a diameter of 4.4 cm. * IVC diameter < 2.1 cm that collapses > 50% with a sniff suggests normal RAP (0-5 mmHg, mean 3 mmHg). * No evidence of pericardial effusion * No prior study available for comparison.Echo 2D Ltd if [employer] and or 3DResult Date: 06/17/2020 * Study limited by poor acoustic windows. * Suboptimal visualization of the left ventricle, but it appears to be normal size with normal function.  No regional wall motion abnormalities.  LVEF estimated by visual assessment was between 55-60%. * Suboptimal visualization of the right ventricle, but it appears to be dilated with mildly decreased function. * There is beat to beat variability in the LV and RV function due to frequent ventricular ectopy. * IVC diameter < 2.1 cm that collapses > 50% with a sniff suggests normal RAP (0-5 mmHg, mean 3 mmHg). * No significant pericardial effusion. * Compared with the prior study, dated 05/29/2020, there are changes noted.  The RV appears dilated with mildly reduced function.Echo 2D Ltd w Doppler and CFI if Ind Image Enhancement and or 3DResult Date: 05/29/2020 * Limited study for left ventricular function. * No regional wall motion abnormalities.  Normal left ventricular systolic function.  LVEF calculated by biplane Simpson's was 66%. * Normal right ventricular systolic function. * No evidence of pericardial effusion. * Compared with the prior study, dated 05/11/2020, there is no significant change. No results found for this or any previous visit.No results found for this or any previous visit. No results found for this or any previous visit. MRI Heart w wo IV Contrast w Velocity FlowResult Date: 9/17/20211. Mildly enlarged  left ventricular size. Mildly depressed left ventricular systolic function. LVEF: 49%. There is mild to moderate asymmetric myocardial hypertrophy with the basal to mid septum measuring the thickest, up to 16mm at the basal anteroseptum.  There is basal to mid inferoseptal/inferior wall hypokinesis and associated thinning. Two areas of ischemic delayed enhancement:  subendocardial delayed enhancement basal to mid inferior/inferoseptal wall with 50-75% transmurality, consistent with known myocardial infarction in the distribution of the RCA and a second area that was possibly present on the prior study 04/19/20 but more subtle with subendocardial delayed enhancement in the basal to mid anterior/anteroseptal/anterolateral wall with ~25% transmurality (consistent with prior LAD MI). T2 hyperintensity is noted in a similar location as prior study and heterogeneous mid myocardial delayed enhancement in the basal lateral segments, concerning for myocarditis, possibly related to check point inhibitor therapy and known prior infarct in LAD and RCA distribution. 2. Normal right ventricular size. Normal right ventricular systolic function.  RVEF: 54%. No right ventricular delayed myocardial enhancement. 3. No significant valvular abnormalities. 4. Moderate biatrial enlargement 5. Dilatation of the ascending aorta up to 4 cm.  Enlarged pulmonary artery may be seen in pulmonary hypertension. Compared to prior study dated 04/19/2020, LVEF appears similar, with possible slight improvement from 44% and RVEF has normalized. Due to technical and artifactual differences, T2 appears in similar location, but cannot quantify differences. DGE burden also appear in the same locations as prior study. Consider follow up cMRI in 6-8 weeks for monitoring.  Report Initiated By:  Ferne Reus, MD Reported And Signed By: Jac Canavan, MDMRI Heart w wo IV Contrast w Velocity Flow (YH SR)Result Date: 8/25/20211. Mildly enlarged left ventricular size. Mildly depressed left ventricular systolic function. LVEF: 44%. Basal to mid inferoseptal/inferior wall hypokinesis and thinning with subendocardial delayed enhancement, consistent with known myocardial infarction in the distribution of the RCA. Mild T2 hyperintensity and heterogeneous mid myocardial delayed enhancement in the basal lateral segments, concerning for myocarditis, possibly related to check point inhibitor therapy. 2. Normal right ventricular size. Mildly depressed right ventricular systolic function.  RVEF: 48%. No right ventricular delayed myocardial enhancement. 3. No significant valvular abnormalities. 4. Moderate biatrial enlargement 5. Dilatation of the ascending aorta up to 4 cm.  Enlarged pulmonary artery may be seen in pulmonary hypertension.  Consider repeat cardiac MRI in 6-8 weeks for monitoring. Report Initiated By:  Ferne Reus, MD PhD Reported And Signed By: Maryjane Hurter, MD Recent Lab Values     06/17/20 06/16/20 06/03/20        0234 [id number]       QTC Interval [phone]        Cardiac Medications Outpatient Medications: Medications Prior to Admission Medication Sig Dispense Refill Last Dose ? albuterol sulfate 90 mcg/actuation HFA aerosol inhaler Inhale 2 puffs into the lungs every 6 (six) hours as needed for wheezing or shortness of breath. 6.7 g 0  ? aspirin 81 MG EC tablet Take 81 mg by mouth daily.    ? atorvastatin (LIPITOR) 80 mg tablet Take 1 tablet (80 mg total) by mouth nightly. 30 tablet 6  ? calcium carbonate-vitamin D3 500 mg(1,250mg ) -400 unit per tablet Take 1 tablet by mouth 2 (two) times daily. 60 tablet 2  ? carboxymethylcellulose (REFRESH PLUS) 0.5 % ophthalmic solution in dropperette Place 1 drop into both eyes 3 (three) times daily as needed.    ? clopidogreL (PLAVIX) 75 mg tablet Take 75 mg by mouth daily.    ? escitalopram oxalate (LEXAPRO) 10 mg  tablet Take 1 tablet (10 mg total) by mouth nightly. 90 tablet 3  ? fexofenadine (ALLEGRA) 180 mg tablet Take 180 mg by mouth daily as needed.     ? furosemide (LASIX) 40 mg tablet Take 0.5 tablets (20 mg total) by mouth daily. 30 tablet 2  ? gabapentin (NEURONTIN) 300 mg capsule Take 2 capsules (600 mg total) by mouth nightly. 60 capsule 0  ? lisinopriL (PRINIVIL,ZESTRIL) 2.5 mg tablet Take 2.5 mg by mouth daily.    ? LORazepam (ATIVAN) 0.5 mg tablet Take 1 tablet (0.5 mg total) by mouth every 6 (six) hours as needed for anxiety. 15 tablet 0  ? metoprolol succinate XL (TOPROL-XL) 25 mg 24 hr tablet Take 0.5 tablets (12.5 mg total) by mouth daily. Take with or immediately following a meal. 30 tablet 0  ? nicotine (NICODERM CQ) 21 mg transdermal patch APPLY 1 PATCH DAILY. ROTATE PATCH SITES. LEAVE ON FOR 24 HOURS UNLESS OTHERWISE DIRECTED.    ? nicotine polacrilex (COMMIT) 2 mg lozenge Place 1 lozenge (2 mg total) inside cheek every 2 (two) hours as needed for smoking cessation. 108 each 2  ? oxybutynin XL (DITROPAN-XL) 5 mg 24 hr tablet Take 1 tablet (5 mg total) by mouth daily. 90 tablet 0  ? oxyCODONE (OXYCONTIN) 10 mg 12 hr extended release tablet Take 1 tablet (10 mg total) by mouth every 12 (twelve) hours. 60 tablet 0  ? oxyCODONE (ROXICODONE) 5 mg Immediate Release tablet Take 1 tablet (5 mg total) by mouth every 3 (three) hours as needed for up to 10 doses. 200 tablet 0  ? pantoprazole (PROTONIX) 40 mg tablet Take 1 tablet (40 mg total) by mouth daily. 30 tablet 2  ? polyethylene glycol (MIRALAX) 17 gram packet Take 1 packet (17 g total) by mouth daily as needed for constipation. Mix in 8 ounces of water, juice, soda, coffee or tea prior to taking. 14 each 0  ? [START ON 08/09/2020] predniSONE (DELTASONE) 10 mg tablet Take 3 tablets (30 mg total) by mouth daily for 7 days, THEN 2 tablets (20 mg total) daily for 7 days, THEN 1 tablet (10 mg total) daily for 7 days, THEN 0.5 tablets (5 mg total) daily for 7 days, THEN 0.5 tablets (5 mg total) daily for 7 days. Take with food.. 49 tablet 0 ? senna-docusate (SENNA-PLUS) 8.6-50 mg tablet Take 1 tablet by mouth nightly. 30 tablet 0  ? simethicone (MYLICON) 80 mg chewable tablet Take 1 tablet (80 mg total) by mouth every 6 (six) hours as needed for flatulence. 30 tablet 0  ? tamsulosin (FLOMAX) 0.4 mg 24 hr capsule Take 1 capsule (0.4 mg total) by mouth nightly. 30 capsule 2  ? belladonna alkaloids-opium (B&O SUPPRETTES) 16.2-30 mg suppository Place 1 suppository rectally 2 (two) times daily as needed for bladder spasm. 12 suppository 0  ? predniSONE (DELTASONE) 10 mg tablet Take 13 tablets (130 mg total) by mouth daily for 7 days, THEN 12 tablets (120 mg total) daily for 7 days, THEN 11 tablets (110 mg total) daily for 7 days, THEN 10 tablets (100 mg total) daily for 7 days, THEN 9 tablets (90 mg total) daily for 7 days, THEN 8 tablets (80 mg total) daily for 7 days, THEN 7 tablets (70 mg total) daily for 7 days, THEN 6 tablets (60 mg total) daily for 7 days, THEN 5 tablets (50 mg total) daily for 7 days, THEN 4 tablets (40 mg total) daily for 7 days. Take with food.. 595 tablet  0  ? predniSONE (DELTASONE) 50 mg tablet Take with food. 56 tablet 0  ? sulfamethoxazole-trimethoprim (BACTRIM DS;CO-TRIMOXAZOLE DS) 800-160 mg per tablet Take 1 tablet by mouth Every Monday, Wednesday, and Friday. 28 tablet 0  ? walker Misc Use as directed. 1 each 0  Inpatient Medications: Notable for aspirin, enoxaparin, furosemide, lisinopril, metoprolol succinate, rosuvastatin, prednisoneCurrent Facility-Administered Medications Medication Dose Route Frequency Provider Last Rate Last Admin ? aspirin EC delayed release tablet 81 mg  81 mg Oral Daily Moreines, Reynolds Bowl, MD   81 mg at 06/18/20 2956 ? calcium-vitamin D 500 mg-200 units tablet  1 tablet Oral Daily Tim Lair, MD   1 tablet at 06/18/20 0910 ? enoxaparin (LOVENOX) syringe 40 mg  40 mg Subcutaneous Q12H Burnett Corrente, PA   40 mg at 06/18/20 2130 ? escitalopram oxalate (LEXAPRO) tablet 10 mg  10 mg Oral Nightly Tim Lair, MD   10 mg at 06/17/20 2145 ? furosemide (LASIX) injection 60 mg  60 mg Intravenous BID Golden Pop, MD     ? gabapentin (NEURONTIN) capsule 600 mg  600 mg Oral Nightly Tim Lair, MD   600 mg at 06/17/20 2145 ? insulin lispro (Admelog, HumaLOG) Sliding Scale (See admin instructions for dose)   Subcutaneous TID AC Moreines, Reynolds Bowl, MD     ? insulin lispro (Admelog, HumaLOG) Sliding Scale (See admin instructions for dose)   Subcutaneous Nightly Ramos, Rey F, MD     ? lisinopriL (PRINIVIL,ZESTRIL) tablet 2.5 mg  2.5 mg Oral Daily Moreines, Reynolds Bowl, MD   2.5 mg at 06/18/20 8657 ? metoprolol succinate (TOPROL-XL) 24 hr tablet 12.5 mg  12.5 mg Oral Daily Moreines, Reynolds Bowl, MD   12.5 mg at 06/18/20 8469 ? nicotine (NICODERM CQ) transdermal patch 24 hr 21 mg  21 mg Transdermal Daily Tim Lair, MD   21 mg at 06/18/20 0912 ? oxybutynin XL (DITROPAN-XL) 24 hr tablet 5 mg  5 mg Oral Daily Moreines, Reynolds Bowl, MD   5 mg at 06/18/20 6295 ? oxyCODONE (OxyCONTIN) 12 hr tablet 10 mg  10 mg Oral Q12H Moreines, Reynolds Bowl, MD   10 mg at 06/18/20 0908 ? pantoprazole (PROTONIX) EC tablet 40 mg  40 mg Oral Daily Tim Lair, MD   40 mg at 06/18/20 0910 ? predniSONE (DELTASONE) tablet 110 mg  110 mg Oral Daily Tim Lair, MD   110 mg at 06/18/20 2841 ? rosuvastatin (CRESTOR) tablet 40 mg  40 mg Oral Daily Moreines, Reynolds Bowl, MD   40 mg at 06/18/20 3244 ? senna-docusate (SENNA-PLUS) 8.6-50 mg per tablet 1 tablet  1 tablet Oral BID Tim Lair, MD   1 tablet at 06/18/20 0102 ? sodium chloride 0.9 % flush 3 mL  3 mL IV Push Q8H Moreines, Reynolds Bowl, MD   3 mL at 06/17/20 2152 ? sulfamethoxazole-trimethoprim (BACTRIM DS;CO-TRIMOXAZOLE DS) 800-160 mg per tablet 1 tablet  1 tablet Oral Once per day on Mon Wed Fri Moreines, Reynolds Bowl, MD 1 tablet at 06/17/20 7253 ? tamsulosin (FLOMAX) 24 hr capsule 0.4 mg  0.4 mg Oral Nightly Tim Lair, MD   0.4 mg at 06/17/20 2145  Infusions:  PRN Meds: acetaminophen, albuterol, carboxymethylcellulose, dextrose 50% in water (D50W), dextrose 50% in water (D50W), Enteral Hypoglycemia Management if Blood Glucose 50 - 69 mg/dL or 70 - 79 mg/dL with symptoms of Hypoglycemia, Enteral Hypoglycemia Management if Blood Glucose less than 50 mg/dL, glucagon, LORazepam, oxyCODONE, polyethylene glycol, simethicone,  sodium chloride I&O, Vitals, Physical Exam Vitals:Vitals:  06/17/20 1800 06/17/20 2016 06/18/20 0100 06/18/20 0830 BP: 105/62 118/68  125/71 Pulse:  63  83 Resp: 16 16  16  Temp: 98.5 ?F (36.9 ?C) 98 ?F (36.7 ?C)  98.4 ?F (36.9 ?C) TempSrc:  Oral  Oral SpO2: 97% 95%  (!) 93% Weight:  (!) 139.1 kg 122.3 kg  Height:   6' (1.829 m)   I/O's last 24 hours:Gross Totals (Last 24 hours) at 06/18/2020 0925Last data filed at 06/18/2020 0647Intake 240 ml Output 3650 ml Net -3410 ml  Weights:Admission Weight: 136.1 kg (300 lb)Last 3 Weights  06/16/20 2308 06/17/20 2016 06/18/20 0100 Weight (lbs): 300 Lbs. 306.66 Lbs. 269.62 Lbs.  Physical exam:Constitutional: NADHEENT: EOMI, no scleral icterus, MMM, no oral lesionsCV: RRR, S1, S2, no m/r/g, JVP not elevated, 2+ pitting LE edema up to knees, no carotid bruits, distal pulses 2+Pulm: Crackles bilaterally up to mid-lung on posterior auscultationAbd: soft, NT, +distension, active BS, no rebound or guardingSkin: no rashesNeuro: A&Ox3, no focal deficitsPsych: euthymic mood and affect  Other Labs CBCRecent Labs Lab 10/21/212321 10/21/212324 10/22/211333 10/23/210607 WBC 23.9*  --  27.6* 21.1* HGB 10.4*  --  11.1* 10.3* HCT 31.2* 30.0* 33.3* 29.9* PLT 261  --  244 249  LFTsRecent Labs Lab 10/21/211654 10/21/212321 10/22/211333 ALT 34 33 33 AST 13 19 21  ALKPHOS 118 128* 125* BILITOT 0.2 <0.2 0.3 BILIDIR  --  <0.2 <0.2 CoagsRecent Labs Lab 10/21/212321 INR 0.94  BMPRecent Labs Lab 0000 10/21/211654 10/21/211654 10/21/212321 10/21/212324 10/22/210654 10/22/210800 10/22/211333 10/22/211852 10/22/212017 10/23/210607 10/23/210816 NA  --  140   < > 137  --   --   --  139  --   --  142  --  K  --  4.6   < > 4.7 4.3  --   --  4.0  --   --  3.8  --  CL  --  101   < > 101  --   --   --  98  --   --  101  --  CO2  --  26  --   --   --   --   --  26  --   --  29  --  BUN  --  25*   < > 29*  --   --   --  30*  --   --  35*  --  CREATININE  --  1.62*   < > 1.56* 1.6*  --   --  1.61*  --   --  1.77*  --  GLU   < > 164*   < > 170* 167*  --    < > 158*   < > 226* 102* 91 CALCIUM  --  8.5*   < > 8.7*  --   --   --  8.7*  --   --  8.7*  --  MG  --   --   --   --   --  2.0  --   --   --   --   --   --   < > = values in this interval not displayed. Cardiac BiomarkersRecent Labs Lab 10/21/212321 10/22/210259 10/22/211333 10/23/210607 TROPONINT 0.03* 0.04* 0.05* 0.06* Lab Results Component Value Date  TROPONINI 0.29 (H) 06/17/2020 PBNPNo components found for: BTYPENATRIUR LipidsLab Results Component Value Date  CHOL 141 05/11/2020  HDL 39 (L) 05/11/2020  LDL 75 05/11/2020  TRIG 133 05/11/2020 A1CLab Results Component Value  Date  HGBA1C 5.6 04/08/2020  Other Imaging No results found.Synopsis Paul Hunter is a 69 y.o. 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, LVEF most recently 66% in 05/2020), newly diagnosed urothelial carcinoma, current tobacco use presenting with acute onset dyspnea and worsening LE swelling, found to be in ADHF.Consult Impression & Recommendations Cardiovascular Problem List:# CAD s/p PCI in 1996, PCI x3 in 2014, PCI x4# HF with recovered EF (was 39% in 09/2019, then 62% in 01/2020)# 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 We can go down on his diuretic dose. Although his JVP appears to be dramatically improved, he continues to have crackles midway up to his bilateral lungs and has significant lower extremity edema. He needs compression stockings to promote movement of the fluid into the intravascular space. His troponins continue to be flat at 0.03->0.05->0.05. His creatinine remains at the same level at 1.61. At this time, it is not possible to definitively say if his diastolic heart failure exacerbation is due to worsening ICI myocarditis (thus requiring more steroids), or due to too much steroids. Based on the very low troponin and lack of ischemic changes on EKG, we do not suspect ACS. In any case, given his active bladder malignancy with gross hematuria, PCI with DAPT would not be ideal in him.Recommendations:- Modify diuresis from furosemide 80mg  IV BID to furosemide 60mg  IV BID with goal of net negative 1.5-2L over next 24 hours- Please get compression stockings and apply to bilateral legs- Continue strict I/O and daily morning weights- Replete K to 4.0 and Mg to 2.0 on daily BMP- Continue daily EKG and daily troponin- Continue aspirin, statin, lisinopril, metoprolol and [facility]. Based on the results of that, we will let you know if any modifications should be made to his steroid plan and whether he needs to add immunomodulators- Cardio-Oncology (Dr Bryson Ha) will reassess the patient after the cMRI is doneThe patient was discussed with cardiology consult attending Dr Grayland Ormond. PRELIMINARY NOTE - FINAL RECOMMENDATIONS PENDING ATTENDING ADDENDUM.Rolene Course, MD MPH PGY-4Cardiology Fellow

## 2020-06-18 NOTE — Plan of Care
Problem: Adult Inpatient Plan of CareGoal: Plan of Care ReviewOutcome: Interventions implemented as appropriateGoal: Patient-Specific Goal (Individualized)Outcome: Interventions implemented as appropriateGoal: Absence of Hospital-Acquired Illness or InjuryOutcome: Interventions implemented as appropriateGoal: Optimal Comfort and WellbeingOutcome: Interventions implemented as appropriateGoal: Readiness for Transition of CareOutcome: Interventions implemented as appropriate Problem: InfectionGoal: Absence of Infection Signs and SymptomsOutcome: Interventions implemented as appropriate Problem: Restraint, Nonbehavioral (Nonviolent)Goal: Discontinuation Criteria AchievedOutcome: Interventions implemented as appropriate Problem: Skin Injury Risk IncreasedGoal: Skin Health and IntegrityOutcome: Interventions implemented as appropriate Problem: Fall Injury RiskGoal: Absence of Fall and Fall-Related InjuryOutcome: Interventions implemented as appropriate Problem: Impaired Wound HealingGoal: Optimal Wound HealingOutcome: Interventions implemented as appropriate Plan of Care Overview/ Patient Status     0700-1900 VSS. Pt is on 3 L NC. Pt is alert and oriented x4.  Tele is still in place. Pt is cont of bowel. Last bm 06/18/20. Foley is intact. Foley cares were done during the shift.  Pt is up with 1 person. Takes meds whole. No nausea noted. Pt complained about abdomen pain, 4/10. Prn oxy was given with some relief. Bed alarm is on. Pt refused his insulin lispro. The provider was notified. Frequent roundigns were performed. Bed alarm is on. SCDS were applied to the lower extremities. Continue to monitor.

## 2020-06-18 NOTE — Plan of Care
Admission Note Nursing Paul Hunter is a 69 y.o. male admitted with a chief complaint of SOB. Patient arrived from ED  Vitals:  06/17/20 1348 06/17/20 1608 06/17/20 1800 06/17/20 2016 BP: 108/60 132/74 105/62 118/68 Pulse: 60 63  63 Resp: 16 16 16 16  Temp: 97 ?F (36.1 ?C)  98.5 ?F (36.9 ?C) 98 ?F (36.7 ?C) TempSrc: Oral   Oral SpO2: 96% 94% 97% 95% Weight:    (!) 139.1 kg Height:     Oxygen therapy Oxygen TherapySpO2: 95 %Device (Oxygen Therapy): nasal cannulaOxygen Tank PSI Reading: 1520O2 Flow (L/min): 3$Oxygen On/Off : Therapy continuedI have reviewed the patient's current medication orders..See flowsheets, patient education and plan of care for additional information. Plan of Care Overview/ Patient Status    1900-0700Patient A&Ox4. VSS on 3L NC. Tele monitoring continued, NSR 60's. C/O of abdominal pain, scheduled oxycodone given with positive effect. No c/o of SOB, chest pain, N/V/D this shift. Scabs to BUE, edema +3 BLE, skin otherwise intact. Cardiac diet, pills whole. Foley in place, draining clear yellow urine, foley care provided. Pt did not ambulate this shift. T&P independently. Safety maintained, hourly rounding complete. Problem: Adult Inpatient Plan of CareGoal: Plan of Care ReviewOutcome: Interventions implemented as appropriateGoal: Patient-Specific Goal (Individualized)Outcome: Interventions implemented as appropriateGoal: Absence of Hospital-Acquired Illness or InjuryOutcome: Interventions implemented as appropriateGoal: Optimal Comfort and WellbeingOutcome: Interventions implemented as appropriateGoal: Readiness for Transition of CareOutcome: Interventions implemented as appropriate Problem: InfectionGoal: Absence of Infection Signs and SymptomsOutcome: Interventions implemented as appropriate Problem: Restraint, Nonbehavioral (Nonviolent)Goal: Discontinuation Criteria AchievedOutcome: Interventions implemented as appropriate Problem: Skin Injury Risk IncreasedGoal: Skin Health and IntegrityOutcome: Interventions implemented as appropriate Problem: Fall Injury RiskGoal: Absence of Fall and Fall-Related InjuryOutcome: Interventions implemented as appropriate

## 2020-06-19 LAB — BASIC METABOLIC PANEL
BKR ANION GAP: 14 (ref 7–17)
BKR BLOOD UREA NITROGEN: 40 mg/dL — ABNORMAL HIGH (ref 8–23)
BKR BUN / CREAT RATIO: 24.1 — ABNORMAL HIGH (ref 8.0–23.0)
BKR CALCIUM: 8.7 mg/dL — ABNORMAL LOW (ref 8.8–10.2)
BKR CHLORIDE: 99 mmol/L (ref 98–107)
BKR CO2: 27 mmol/L (ref 20–30)
BKR CREATININE: 1.66 mg/dL — ABNORMAL HIGH (ref 0.40–1.30)
BKR EGFR (AFR AMER): 50 mL/min/{1.73_m2} (ref >60)
BKR EGFR (NON AFRICAN AMERICAN): 42 mL/min/{1.73_m2} (ref >60)
BKR GLUCOSE: 127 mg/dL — ABNORMAL HIGH (ref 70–100)
BKR POTASSIUM: 4 mmol/L (ref 3.3–5.3)
BKR SODIUM: 140 mmol/L (ref 136–144)
BKR WAM MCH (PG): 127 mg/dL — ABNORMAL HIGH (ref 70–100)

## 2020-06-19 LAB — TROPONIN T     (Q): BKR TROPONIN T: 0.05 ng/mL — ABNORMAL HIGH (ref <0.01)

## 2020-06-19 LAB — CBC WITHOUT DIFFERENTIAL
BKR WAM ANALYZER ANC: 18.2 x 1000/??L — ABNORMAL HIGH (ref 1.0–11.0)
BKR WAM HEMATOCRIT: 29.7 % — ABNORMAL LOW (ref 37.0–52.0)
BKR WAM HEMOGLOBIN: 10.1 g/dL — ABNORMAL LOW (ref 12.0–18.0)
BKR WAM MCHC: 34 g/dL (ref 31.0–36.0)
BKR WAM MCV: 96.4 fL — ABNORMAL HIGH (ref 78.0–94.0)
BKR WAM MPV: 10.1 fL (ref 6.0–11.0)
BKR WAM PLATELETS: 248 x1000/??L (ref 140–440)
BKR WAM RDW-CV: 15.6 % — ABNORMAL HIGH (ref 11.5–14.5)
BKR WAM RED BLOOD CELL COUNT: 3.1 M/??L — ABNORMAL LOW (ref 3.8–5.9)
BKR WAM WHITE BLOOD CELL COUNT: 20.7 x1000/??L — ABNORMAL HIGH (ref 4.0–10.0)

## 2020-06-19 MED ORDER — POLYETHYLENE GLYCOL 3350 17 GRAM ORAL POWDER PACKET
17 gram | Freq: Every day | ORAL | Status: DC
Start: 2020-06-19 — End: 2020-06-24
  Administered 2020-06-19 – 2020-06-23 (×4): 17 gram via ORAL

## 2020-06-19 NOTE — Plan of Care
Plan of Care Overview/ Patient Status    0700-1900Patient A&OX4 soft BP this shift. Patient on 2l 0xygen saturating at 95% titrated to 1 liter this shift tolerated well no sob or difficulty breathing pao2 94%. Assist x1, Pills whole with water. Continent of B&B. Foley displaying red tinged output provider Haight made aware and d/c foley Patient able to void without difficulty using urinal at bedside. Bladder scan as ordered. Patient up in chair with compression stocking applied when OOB as ordered.+2 Edema on BLE.  Wheezes auscultated in bilateral lower lobes and right upper lobe posteriorly. Respiratory administered PRN nebulizer as ordered. Patient complained of pain in abdomen this shift scheduled oxy given with effect. Bed in lowest position, bed alarm on and audible, call light within reach, safety maintained, will continue to monitor.

## 2020-06-19 NOTE — Progress Notes
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.8]  =========================================================================     Interval Events:  Feels better with diuresis   No acute events overnight    Objective:    Vitals:  Afebrile  Pulse 94  RR 16  BP 101/56    Intake/Output:  890 in  205 out  Net -1.1L     Physical Exam:  Lying in bed, appears well, appears stated age, no acute distress  MMM  JVP elevation to base of jaw  Lungs with mild bibasilar crackles  Heart RRR, no murmurs  Extremities wwp with b/l pitting edema, SCDs in place  No rash  Alert and oriented    Notable Labs:  Creatinine today 1.66    Diagnostics:  TTE 06/17/2020:   * Study limited by poor acoustic windows.  * Suboptimal visualization of the left ventricle, but it appears to be normal size with normal function.  No regional wall motion abnormalities.  LVEF estimated by visual assessment was between 55-60%.  * Suboptimal visualization of the right ventricle, but it appears to be dilated with mildly decreased function.  * There is beat to beat variability in the LV and RV function due to frequent ventricular ectopy.  * IVC diameter < 2.1 cm that collapses > 50% with a sniff suggests normal RAP (0-5 mmHg, mean 3 mmHg).  * No significant pericardial effusion.  * Compared with the prior study, dated 05/29/2020, there are changes noted.  The RV appears dilated with mildly reduced function.    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 He is admitted with worsening dyspnea with volume overload.     I suspect the etiology of volume overload is multifactorial, both due to his cardiac dysfunction as well as his steroid use. He awaits a cardiac MRI to evaluate the status of his ICI myocarditis. We do not suspect active ACS at this time, although his underlying coronary artery disease is likely a contributing factor. Unfortunately due to hematuria we are hesitant to commit him to DAPT.    He has responded well to IV diuretics. Today we favor aiming to keep him net even to slightly net negative to allow him to mobilize some of his extravascular volume into his intravascular space. He has received one dose of diuretics this morning which should be sufficient to meet this goal. Tomorrow we can plan to resume brisk diuresis once he has equilibrated a bit.     Recommendations:  1. S/p 60mg  IV lasix this AM  2. Would aim to keep slightly net negative to net even today to allow his extravascular volume to mobilize intravascularly  3. Will plan to resume brisk diuresis tomorrow  4. CMRI ordered, will hopefully happen early this week  5. Continue strict intake and output and daily standing weights  6. Continue aspirin, statin, lisinpril, metoprolol and steroids  7. Further plans pending results of MRI    Thank you so much for involving [place] in the care of this patient. Please do not  hesitate to call with any questions or concerns. Easiest way to contact is via [facility]. These recommendations are preliminary and may change pending review with the attending and final attending attestation.     Paul Hunter D. Anola Gurney, MD  Fellow in Cardiovascular Diseases   [facility]  Best Contact: [facility]  06/19/20

## 2020-06-19 NOTE — Progress Notes
[facility]    [facility]     Medicine Progress Note    Attending Provider: Dr. Raford Pitcher    Subjective:     Interim History:    I have no complaints, feel good, swelling in legs better   No CP, no SOB.   State he slept well last night   2nd dose lasix held 2/2 renal function  Agreed to wear Compression stockings  Hx os OSA and uses CPAP at home        Review of Allergies/Meds/Hx:     I have reviewed the patient's: allergies, current scheduled medications, current infusions, current prn medications and past medical history    Objective:     Vitals:  I have reviewed the patient's current vital signs as documented in the patient's EMR.   and Last 24 hours: Temp:  [97.7 ?F (36.5 ?C)-98.4 ?F (36.9 ?C)] 97.7 ?F (36.5 ?C)  Pulse:  [61-94] 94  Resp:  [16-20] 16  BP: (94-101)/(56-61) 101/56  SpO2:  [94 %-95 %] 95 %    I/O's:  I have reviewed the patient's current I&O's as documented in the EMR.    Gross Totals (Last 24 hours) at 06/19/2020 1510  Last data filed at 06/19/2020 1300  Intake 440 ml   Output 2250 ml   Net -1810 ml       Procedures:  None     Physical Exam:     PE  Constitutional:?Pleasant, comfortable, NAD, ill appearing  HEENT:?NCAT, EOMI, sclera anicteric  Neck:?Supple.   Cardiovascular: Murmur  Pulmonary/Chest:?mild crackles at bases  Abdominal:?Soft, + BS, ND/NT  Musculoskeletal:?FROM   Extremities:?WWP. 3+ edema  Neurological: A+Ox4. CNs grossly intact. Nonfocal.  Skin: No rash noted.   Psychiatric:?Normal mood and affect.    Nursing note and vitals reviewed.    Labs:  Recent Labs   Lab   0000 06/16/20  1654 06/16/20  1654 06/16/20  2321 06/16/20  2324 06/17/20  0654 06/17/20  0800 06/17/20  1333 06/17/20  1852 06/18/20  0607 06/18/20  0816 06/19/20  0556 06/19/20  0751 06/19/20  1118 06/19/20  1311   NA  --  140   < > 137  --   --   --  139  --  142  --  140  --   --   --    K  --  4.6   < > 4.7 4.3  --   --  4.0  --  3.8  --  4.0  --   --   --    CL  --  101   < > 101  --   -- --  98  --  101  --  99  --   --   --    CO2  --  26  --   --   --   --   --  26  --  29  --  27  --   --   --    BUN  --  25*   < > 29*  --   --   --  30*  --  35*  --  40*  --   --   --    CREATININE  --  1.62*   < > 1.56* 1.6*  --   --  1.61*  --  1.77*  --  1.66*  --   --   --    GLU   < >  164*   < > 170* 167*  --    < > 158*   < > 102*   < > 127* 141* 181* 216*   CALCIUM  --  8.5*   < > 8.7*  --   --   --  8.7*  --  8.7*  --  8.7*  --   --   --    MG  --   --   --   --   --  2.0  --   --   --   --   --   --   --   --   --     < > = values in this interval not displayed.     Recent Labs   Lab 06/16/20  1654 06/16/20  1654 06/16/20  2321 06/16/20  2324 06/17/20  1333 06/18/20  0607 06/19/20  0556   WBC 26.1*   < > 23.9*  --  27.6* 21.1* 20.7*   HGB 10.2*   < > 10.4*  --  11.1* 10.3* 10.1*   HCT 31.1*   < > 31.2* 30.0* 33.3* 29.9* 29.7*   PLT 268   < > 261  --  244 249 248   MCV 99.7*   < > 94.5*  --  96.8* 94.9* 96.4*   NEUTROPHILS 93.5*  --  91.0*  --  92.7* 86.5*  --    MONOCYTES 1.3*  --  2.5*  --  2.1* 3.3*  --     < > = values in this interval not displayed.     Recent Labs   Lab 06/16/20  2321   LABPROT 10.3   INR 0.94   DDIMER 1.78*     Recent Labs   Lab 06/16/20  1654 06/16/20  2321 06/17/20  1333   ALKPHOS 118 128* 125*   BILITOT 0.2 <0.2 0.3   BILIDIR  --  <0.2 <0.2   PROT 5.2* 5.7*  --    ALT 34 33 33   AST 13 19 21     No results for input(s): AMYLASE, LIPASE in the last 168 hours.    No results for input(s): PHART, PCO2ART, PO2ART, O2SATART, BEART, HCO3ART in the last 168 hours.  Recent Labs   Lab 06/17/20  0259 06/17/20  1333 06/18/20  0607 06/19/20  0556   TROPONINT 0.04* 0.05* 0.06* 0.05*         Diagnostics:  No new radiology.    ECG/Tele Events:   No ECG ordered today    Assessment:     Assessment:  69 y.o. 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 The etiology of his rather abrupt decompensation is not entirely clear, but suspect there is a contribution of his ongoing steroid use and perhaps spread of his pulmonary mets resulting in increased pulmonary edema. He has responded well to aggressive IV diuresis.    Diastolic heart failure  -appreciate cards recs  -IV lasix to 60 mg BID- held today and resume when Creatine stable.   -goal 1.5-2L output  -compression stockings ordered  -cont I&Os  -cardiac MRI pending for Monday 10/25  -replete K >4 and Mg >2  -cont daily EKG and troponin  -cont metoprolol, lisinopril, ASA, statin  -cont pred taper for myocarditis (110mg  for 7 days then taper 10 100mg ), cont bactrim ppx  -  limited TTE to eval EF    Metastatic bladder canc  er  -cont flomax and oxybutinin  -cont oxycontin10 mg BID  -cont oxycodone Q3H prn     Depression  -cont lexapro  -cont gabapentin    GERD   -cont PPI    DM  -cont sliding scale inpt  -A1c pending    Diet: cardiac/diabetic  Code Status Full Code/ACLS   DVT Prophylaxis lovenox   ADMIT MED REC Pending pharmacist review   PCP Name), No Pcp (Do Not Change None   Emergency  Extended Emergency Contact Information  Primary Emergency Contact: AUSTIN,[name]  Home Phone: [phone]   Disposition Pending diuresis         Signed:  Jhonnie Garner, MD

## 2020-06-19 NOTE — Plan of Care
Plan of Care Overview/ Patient Status    1900-0700Patient A&O x4. VSS on 3L NC. No c/o of pain, SOB, chest pain, N/V/D this shift. Tele monitoring continued. Scabs to both arms, edema BLE. Cardiac diet, pills whole. Foley intact, blood clot noticed, MD notified, no new interventions. Continent BM. Ax1 OOB. Safety maintained, hourly rounding complete. 5409 Pt reported abdominal pain, PRN oxy given with positive effect. Problem: Adult Inpatient Plan of CareGoal: Plan of Care ReviewOutcome: Interventions implemented as appropriateGoal: Patient-Specific Goal (Individualized)Outcome: Interventions implemented as appropriateGoal: Absence of Hospital-Acquired Illness or InjuryOutcome: Interventions implemented as appropriateGoal: Optimal Comfort and WellbeingOutcome: Interventions implemented as appropriateGoal: Readiness for Transition of CareOutcome: Interventions implemented as appropriate Problem: InfectionGoal: Absence of Infection Signs and SymptomsOutcome: Interventions implemented as appropriate Problem: Restraint, Nonbehavioral (Nonviolent)Goal: Discontinuation Criteria AchievedOutcome: Interventions implemented as appropriate Problem: Skin Injury Risk IncreasedGoal: Skin Health and IntegrityOutcome: Interventions implemented as appropriate Problem: Fall Injury RiskGoal: Absence of Fall and Fall-Related InjuryOutcome: Interventions implemented as appropriate Problem: Impaired Wound HealingGoal: Optimal Wound HealingOutcome: Interventions implemented as appropriate

## 2020-06-20 ENCOUNTER — Encounter: Admit: 2020-06-20 | Payer: PRIVATE HEALTH INSURANCE | Attending: Adult Health

## 2020-06-20 ENCOUNTER — Encounter: Admit: 2020-06-20 | Payer: PRIVATE HEALTH INSURANCE | Attending: Internal Medicine

## 2020-06-20 LAB — CBC WITHOUT DIFFERENTIAL
BKR WAM ANALYZER ANC: 19.1 x 1000/??L — ABNORMAL HIGH (ref 1.0–11.0)
BKR WAM HEMATOCRIT: 31.4 % — ABNORMAL LOW (ref 37.0–52.0)
BKR WAM HEMOGLOBIN: 11 g/dL — ABNORMAL LOW (ref 12.0–18.0)
BKR WAM MCH (PG): 34.2 pg — ABNORMAL HIGH (ref 27.0–31.0)
BKR WAM MCHC: 35 g/dL (ref 31.0–36.0)
BKR WAM MCV: 97.5 fL — ABNORMAL HIGH (ref 78.0–94.0)
BKR WAM MPV: 9.9 fL (ref 6.0–11.0)
BKR WAM PLATELETS: 246 x1000/??L (ref 140–440)
BKR WAM RDW-CV: 15.4 % — ABNORMAL HIGH (ref 11.5–14.5)
BKR WAM RED BLOOD CELL COUNT: 3.2 M/ÂµL — ABNORMAL LOW (ref 3.8–5.9)
BKR WAM WHITE BLOOD CELL COUNT: 22.2 x1000/??L — ABNORMAL HIGH (ref 4.0–10.0)

## 2020-06-20 LAB — HEMOGLOBIN A1C
BKR ESTIMATED AVERAGE GLUCOSE: 151 mg/dL
BKR HEMOGLOBIN A1C: 6.9 % — ABNORMAL HIGH (ref 4.0–5.6)

## 2020-06-20 LAB — BASIC METABOLIC PANEL
BKR ANION GAP: 11 (ref 7–17)
BKR BLOOD UREA NITROGEN: 44 mg/dL — ABNORMAL HIGH (ref 8–23)
BKR BUN / CREAT RATIO: 26 — ABNORMAL HIGH (ref 8.0–23.0)
BKR CALCIUM: 9 mg/dL (ref 8.8–10.2)
BKR CHLORIDE: 98 mmol/L (ref 98–107)
BKR CO2: 31 mmol/L — ABNORMAL HIGH (ref 20–30)
BKR CREATININE: 1.69 mg/dL — ABNORMAL HIGH (ref 0.40–1.30)
BKR EGFR (AFR AMER): 49 mL/min/{1.73_m2} (ref >60)
BKR EGFR (NON AFRICAN AMERICAN): 41 mL/min/{1.73_m2} (ref >60)
BKR GLUCOSE: 105 mg/dL — ABNORMAL HIGH (ref 70–100)
BKR POTASSIUM: 4.4 mmol/L (ref 3.3–5.3)
BKR SODIUM: 140 mmol/L (ref 136–144)

## 2020-06-20 LAB — MAGNESIUM: BKR MAGNESIUM: 2.3 mg/dL — ABNORMAL LOW (ref 1.7–2.4)

## 2020-06-20 LAB — PHOSPHORUS     (BH GH L LMW YH): BKR PHOSPHORUS: 3.6 mg/dL (ref 2.2–4.5)

## 2020-06-20 MED ORDER — BISACODYL 10 MG RECTAL SUPPOSITORY
10 mg | Freq: Once | RECTAL | Status: CP
Start: 2020-06-20 — End: ?
  Administered 2020-06-20: 15:00:00 10 mg via RECTAL

## 2020-06-20 MED ORDER — FUROSEMIDE 10 MG/ML INJECTION SOLUTION
10 mg/mL | INTRAVENOUS | Status: DC
Start: 2020-06-20 — End: 2020-06-22
  Administered 2020-06-20 – 2020-06-21 (×2): 10 mL via INTRAVENOUS

## 2020-06-20 MED ORDER — FUROSEMIDE 10 MG/ML INJECTION SOLUTION
10 mg/mL | Freq: Once | INTRAVENOUS | Status: CP
Start: 2020-06-20 — End: ?
  Administered 2020-06-20: 22:00:00 10 mL via INTRAVENOUS

## 2020-06-20 MED ORDER — ALBUTEROL SULFATE 2.5 MG/3 ML (0.083 %) SOLUTION FOR NEBULIZATION
2.5 mg /3 mL (0.083 %) | Freq: Four times a day (QID) | RESPIRATORY_TRACT | Status: DC
Start: 2020-06-20 — End: 2020-06-21
  Administered 2020-06-20 – 2020-06-21 (×4): 2.5 mL via RESPIRATORY_TRACT

## 2020-06-20 NOTE — Plan of Care
Plan of Care Overview/ Patient Status    0700-1900Patient A&OX4 soft BP on 2l oxygen. Patient o2 saturation decreased to 88% this shift increased oxygen to 3l patient displaying SOB provider Romano notified. Meds given as ordered patient up in chair titrated back down to 2 l oxygen pao2 within normal range.Assist x1 with walker when ambulating. Independent with positioning and bed mobility. Continent of B&B urinal at bedside up to toilet for BM. Suppository given this shift.One large bowel movement this shift. Bright red Blood noted after stool passage. Patient states he has hemorrhids and straining when moving bowels. Bladder scan as ordered. +3 edema in BLE compression stockings applied when OOB. Heart Rate fluctuating between 40-90BPM  Metoprolol held this shift Paul Hunter made aware. Bed in lowest position, bed/ chair alarm on and audible, call light within reach, safety maintained, will continue to monitor.

## 2020-06-20 NOTE — Progress Notes
[facility] Medicine Progress NoteAttending Provider: Roger Kill, MD [phone]                                            Subjective: Interim History: Per RN, patient satting 88% on 1L NC this AM. Increased to 3L NC with sat 92%.Patient reports not feeling good this morning. Reports abdominal pain. Explains every since his bladder ca surgery, he has episodes of intermittent abdominal pain. Awaiting PRN oxy. Also reports he hasn't had a BM in a few days and feels like having BM would help. Agrees to suppository. Reports SOB. No CP.Review of Allergies/Meds/Hx: Review of Allergies/Meds/Hx:I have reviewed the patient's: current scheduled medications, current infusions, current prn medications and past medical historyObjective: Vitals:I have reviewed the patient's current vital signs as documented in the patient's EMR.   and Last 24 hours:Temp:  [97.4 ?F (36.3 ?C)-97.6 ?F (36.4 ?C)] 97.6 ?F (36.4 ?C)Pulse:  [61-65] 65Resp:  [20] 20BP: (121-122)/(67-72) 122/67SpO2:  [88 %-98 %] 88 %I/O's:I have reviewed the patient's current I&O's as documented in the EMR.Gross Totals (Last 24 hours) at 06/20/2020 0846Last data filed at 06/19/2020 1900Intake 240 ml Output 1200 ml Net -960 ml Procedures:None Physical Exam:Physical ExamConstitutional:     Comments: Pleasant male laying in bed in NAD HENT:    Head: Normocephalic. Cardiovascular:    Rate and Rhythm: Normal rate and regular rhythm. Pulmonary:    Effort: Pulmonary effort is normal.    Breath sounds: Wheezing (Inspiratory and expiratory diffusely) present. Abdominal:    General: Bowel sounds are normal. There is distension.    Palpations: Abdomen is soft. Musculoskeletal:    Comments: BLE compression stockings in place Neurological:    Mental Status: He is oriented to person, place, and time. Labs:I have reviewed the patient's labs within the last 24 hrs.Na 140K 4.4Cr 1.69Glucose 105WBC 22.2H&H 11.0/31.4Platelets 246Diagnostics:No new radiology.ECG/Tele Events: No ECG ordered todayAssessment: Assessment:68 y/o male PMHx squamous cell lung ca, metastatic bladder ca to lungs, HFpEF, c/f pembrolizumab related myocarditis, CKD stage 3, who presented with dyspnea, admitted with CHF exacerbation. Plan: HF exacerbation- Appreciate cardiology consult- Continue lisinopril 2.5 mg daily- Continue metoprolol 12.5 mg daily - Resume Lasix at 60 mg IV this AM given Cr stable and increased O2 requirements - defer to cardiology additional dosing later today - Continue ASA and statin- Monitor I/Os and daily weightsMyocarditis- cMRI to eval status of myocarditis - Continue prednisone taper - decrease by 10 mg weekly - Continue Bactrim ppxLeukocytosis- iso high dose steroids Hyperglycemia- iso steroid use- Continue ISSAbdominal pain- iso bladder ca and constipation- Suppository ordered x1- Continue Oxycontin and PRN oxycodoneBladder ca- Pain control as above- Continue Flomax 0.4 mg QHS- Continue oxybutynin 5 mg dailyCode Status: Full code/ACLSMed Rec: completeDiet: Cardiac DMPPx: SQ LovenoxDispo: pending improvement in volume status  Electronically Signed:Zeidy Tayag, PA-CContact via MHB10/25/2021 8:45 AM

## 2020-06-20 NOTE — Significant Event
Received a call from cardiology about MRI not scheduled due to safety sheet not filledI have reached out to the RN - per RN - no notification from MRI about this.Safety sheet to be done by incoming RNOlubunmi Faustine Tates MD, FACP

## 2020-06-20 NOTE — Progress Notes
[facility]     Medicine Service Progress Note     Date of Admission: 06/16/2020  Attending Provider: Roger Kill, MD    Interim Events:   Overnight: NAEO     This AM:  - He subjectively reports improvement in his dyspnea today although the team had increased O2 1-> 3L to keep O2 sat>92%.   - Complained of severe 8-9/10 LLQ LLQ pain. In repeat exam around noon he was feeling better with adequate pain management.     Meds:   Scheduled Meds:  Current Facility-Administered Medications   Medication Dose Route Frequency Provider Last Rate Last Admin   ? albuterol neb sol 2.5 mg/3 mL (0.083%) (PROVENTIL,VENTOLIN)  2.5 mg Nebulization Q6H WA Barrett, Shean L, MD   2.5 mg at 06/20/20 1325   ? aspirin EC delayed release tablet 81 mg  81 mg Oral Daily Tim Lair, MD   81 mg at 06/20/20 0944   ? calcium-vitamin D 500 mg-200 units tablet  1 tablet Oral Daily Moreines, Reynolds Bowl, MD   1 tablet at 06/20/20 0946   ? enoxaparin (LOVENOX) syringe 40 mg  40 mg Subcutaneous Q12H Burnett Corrente, PA   40 mg at 06/20/20 6578   ? escitalopram oxalate (LEXAPRO) tablet 10 mg  10 mg Oral Nightly Moreines, Reynolds Bowl, MD   10 mg at 06/19/20 2052   ? furosemide (LASIX) injection 60 mg  60 mg Intravenous Q24H Peri Jefferson, PA   60 mg at 06/20/20 1039   ? gabapentin (NEURONTIN) capsule 600 mg  600 mg Oral Nightly Moreines, Reynolds Bowl, MD   600 mg at 06/19/20 2052   ? insulin lispro (Admelog, HumaLOG) Sliding Scale (See admin instructions for dose)   Subcutaneous TID AC Moreines, Reynolds Bowl, MD   2 Units at 06/20/20 1422   ? insulin lispro (Admelog, HumaLOG) Sliding Scale (See admin instructions for dose)   Subcutaneous Nightly Ramos, Rey F, MD       ? lisinopriL (PRINIVIL,ZESTRIL) tablet 2.5 mg  2.5 mg Oral Daily Moreines, Reynolds Bowl, MD   2.5 mg at 06/20/20 1038   ? metoprolol succinate (TOPROL-XL) 24 hr tablet 12.5 mg  12.5 mg Oral Daily Moreines, Reynolds Bowl, MD   12.5 mg at 06/19/20 4696   ? nicotine (NICODERM CQ) transdermal patch 24 hr 21 mg  21 mg Transdermal Daily Tim Lair, MD   21 mg at 06/20/20 0947   ? oxybutynin XL (DITROPAN-XL) 24 hr tablet 5 mg  5 mg Oral Daily Moreines, Reynolds Bowl, MD   5 mg at 06/20/20 0944   ? oxyCODONE (OxyCONTIN) 12 hr tablet 10 mg  10 mg Oral Q12H Moreines, Reynolds Bowl, MD   10 mg at 06/20/20 0805   ? pantoprazole (PROTONIX) EC tablet 40 mg  40 mg Oral Daily Moreines, Reynolds Bowl, MD   40 mg at 06/20/20 0946   ? polyethylene glycol (MIRALAX) packet 17 g  17 g Oral Daily Verlee Rossetti, APRN   17 g at 06/20/20 2952   ? predniSONE (DELTASONE) tablet 110 mg  110 mg Oral Daily Tim Lair, MD   110 mg at 06/20/20 0945   ? rosuvastatin (CRESTOR) tablet 40 mg  40 mg Oral Daily Tim Lair, MD   40 mg at 06/20/20 0944   ? senna-docusate (SENNA-PLUS) 8.6-50 mg per tablet 1 tablet  1 tablet Oral BID Moreines, Reynolds Bowl, MD   1  tablet at 06/20/20 0944   ? sodium chloride 0.9 % flush 3 mL  3 mL IV Push Q8H Moreines, Reynolds Bowl, MD   3 mL at 06/19/20 2053   ? sulfamethoxazole-trimethoprim (BACTRIM DS;CO-TRIMOXAZOLE DS) 800-160 mg per tablet 1 tablet  1 tablet Oral Once per day on Mon Wed Fri Moreines, Reynolds Bowl, MD   1 tablet at 06/20/20 0945   ? tamsulosin (FLOMAX) 24 hr capsule 0.4 mg  0.4 mg Oral Nightly Moreines, Reynolds Bowl, MD   0.4 mg at 06/19/20 2052     Continuous Infusions:  PRN Meds:acetaminophen, carboxymethylcellulose, dextrose 50% in water (D50W), dextrose 50% in water (D50W), Enteral Hypoglycemia Management if Blood Glucose 50 - 69 mg/dL or 70 - 79 mg/dL with symptoms of Hypoglycemia, Enteral Hypoglycemia Management if Blood Glucose less than 50 mg/dL, glucagon, LORazepam, nicotine (polacrilex), oxyCODONE, polyethylene glycol, simethicone, sodium chloride    Objective:     Vitals  Temp:  [97.4 ?F (36.3 ?C)-97.6 ?F (36.4 ?C)] 97.6 ?F (36.4 ?C)  Pulse:  [41-65] 62  Resp:  [18-20] 18  BP: (92-122)/(56-72) 92/56  SpO2:  [88 %-98 %] 96 %  Device (Oxygen Therapy): nasal cannula  O2 Flow (L/min):  [1-3] 3    I/O    Gross Totals (Last 24 hours) at 06/20/2020 1615  Last data filed at 06/19/2020 1900  Intake 240 ml   Output 300 ml   Net -60 ml       Weight  Wt:   06/20/20 132.6 kg   06/13/20 (!) 140.6 kg   06/09/20 (!) 141.7 kg   06/02/20 (!) 136.9 kg   05/27/20 (!) 138.3 kg   05/21/20 (!) 139 kg       Physical Exam:  Gen: lying in left decubitus position. Appears uncomfortable and moaning in pain  iso worsening LLQ pain. During second exam after pain management patient was more comfortable and cooperating in exam.   HEENT: NCAT, EOMI, MMM   CV: RRR, nl s1/s2, no m/r/g , JVP elevated    Pulm: bibasilar crackles,  no wheezing or rubs.   Abd: +BS, soft, NTND. No rebound or guarding   Ext: Bilateral 1-2+Pitting LE edema. SCD in place.   Neuro: AOx4, CN 2-12 grossly intact    Labs:     CBC  Recent Labs   Lab 06/18/20  0607 06/18/20  0607 06/19/20  0556 06/19/20  0556 06/20/20  0533   WBC 21.1*  --  20.7*  --  22.2*   HGB 10.3*   < > 10.1*   < > 11.0*   HCT 29.9*   < > 29.7*   < > 31.4*   PLT 249   < > 248   < > 246   MCV 94.9*   < > 96.4*   < > 97.5*    < > = values in this interval not displayed.    Recent Labs   Lab 06/16/20  2321 06/17/20  1333 06/18/20  0607   NEUTROPHILS 91.0* 92.7* 86.5*      Chemistry  Recent Labs   Lab 06/18/20  0607 06/18/20  0816 06/19/20  0556 06/19/20  0751 06/20/20  0533 06/20/20  0812 06/20/20  1407   NA 142  --  140  --  140  --   --    K 3.8   < > 4.0   < > 4.4  --   --    CL 101   < > 99   < >  98  --   --    CO2 29   < > 27   < > 31*  --   --    BUN 35*   < > 40*   < > 44*  --   --    CREATININE 1.77*   < > 1.66*   < > 1.69*  --   --    GLU 102*   < > 127*   < > 105*   < > 249*   ANIONGAP 12   < > 14   < > 11  --   --     < > = values in this interval not displayed.    Recent Labs   Lab 06/17/20  0654 06/18/20  0607 06/19/20  0556 06/20/20  0533   CALCIUM  --  8.7* 8.7* 9.0 MG   < >  --   --  2.3   PHOS  --   --   --  3.6    < > = values in this interval not displayed.      LFTs  Recent Labs   Lab 06/16/20  1654 06/16/20  1654 06/16/20  2321 06/16/20  2321 06/17/20  1333   ALT 34  --  33  --  33   AST 13   < > 19   < > 21   ALKPHOS 118   < > 128*   < > 125*   BILITOT 0.2   < > <0.2   < > 0.3   BILIDIR  --   --  <0.2   < > <0.2    < > = values in this interval not displayed.    Coags  Recent Labs   Lab 06/16/20  2321   LABPROT 10.3   INR 0.94   DDIMER 1.78*          Micro: no new    Imaging: no new    Assessment & Plan:   Paul Hunter is a 69 y.o. 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 Echo done in this admission was evident for EF of 55-60%, no regional WMA with dilated RV with mild RV dysfunction. Responding well to diuretics with improvement in his respiratory status. He is planned to undergo another MRI to evaluate the course of myocarditis. Although he has baseline ischemic heart disease and ICIs can accelerate CAD we do not suspect active ACS at this time and would recommend against LHC. Notably his hematuria makes [place] less hesitant to commit him to DAPT as well.     Recommendations:   - Would recommending diuresing to keep net negative at least a 1L with strict Is/Os and volume assessment and monitoring kidney functions. S/p 60mg  IV lasix this AM with good UO (1200cc). Can trial another 60mg  IV lasix this evening. Please ensure accurate Is/Os monitoring.   - follow up on CMRI ordered  - Continue strict intake and output and daily standing weights  - Continue aspirin, statin, lisinpril, metoprolol and steroids  - Further plans pending results of MRI    PCP: Name), No Pcp (Do Not Change    CONTACT:  Primary Emergency Contact: AUSTIN,[name], Home Phone: [phone]    Signed:  Charlotte Crumb MD-MPH  PGY-2 Internal Medicine  MHB: [phone]  June 20, 2020

## 2020-06-20 NOTE — Plan of Care
Plan of Care Overview/ Patient Status    1900-0700Patient A&O x4. VSS on 1 L NC. Tele monitoring continued. C/O of abdominal pain, PRN oxy given with good effect. C/O of SOB, pt initially refused interventions at assessment, overnight asked for neb treatment, given with good effect. No c/o of chest pain, N/V/D.  Edema to BLE. Cardiac diet, pills whole. Continent B&B, assist x1 w/ RW to bathroom. Voids spontaneously, bladder scanned per order. Safety maintained, hourly rounding complete.Problem: Adult Inpatient Plan of CareGoal: Plan of Care ReviewOutcome: Interventions implemented as appropriateGoal: Patient-Specific Goal (Individualized)Outcome: Interventions implemented as appropriateGoal: Absence of Hospital-Acquired Illness or InjuryOutcome: Interventions implemented as appropriateGoal: Optimal Comfort and WellbeingOutcome: Interventions implemented as appropriateGoal: Readiness for Transition of CareOutcome: Interventions implemented as appropriate Problem: InfectionGoal: Absence of Infection Signs and SymptomsOutcome: Interventions implemented as appropriate Problem: Restraint, Nonbehavioral (Nonviolent)Goal: Discontinuation Criteria AchievedOutcome: Interventions implemented as appropriate Problem: Skin Injury Risk IncreasedGoal: Skin Health and IntegrityOutcome: Interventions implemented as appropriate Problem: Fall Injury RiskGoal: Absence of Fall and Fall-Related InjuryOutcome: Interventions implemented as appropriate Problem: Impaired Wound HealingGoal: Optimal Wound HealingOutcome: Interventions implemented as appropriate

## 2020-06-21 ENCOUNTER — Inpatient Hospital Stay: Admit: 2020-06-21 | Payer: PRIVATE HEALTH INSURANCE

## 2020-06-21 DIAGNOSIS — R0602 Shortness of breath: Secondary | ICD-10-CM

## 2020-06-21 LAB — BASIC METABOLIC PANEL
BKR ANION GAP: 11 (ref 7–17)
BKR BLOOD UREA NITROGEN: 48 mg/dL — ABNORMAL HIGH (ref 8–23)
BKR BUN / CREAT RATIO: 25.7 — ABNORMAL HIGH (ref 8.0–23.0)
BKR CALCIUM: 8.7 mg/dL — ABNORMAL LOW (ref 8.8–10.2)
BKR CHLORIDE: 99 mmol/L (ref 98–107)
BKR CO2: 30 mmol/L (ref 20–30)
BKR CREATININE: 1.87 mg/dL — ABNORMAL HIGH (ref 0.40–1.30)
BKR EGFR (AFR AMER): 44 mL/min/{1.73_m2} (ref >60)
BKR EGFR (NON AFRICAN AMERICAN): 36 mL/min/{1.73_m2} (ref >60)
BKR GLUCOSE: 130 mg/dL — ABNORMAL HIGH (ref 70–100)
BKR POTASSIUM: 3.8 mmol/L (ref 3.3–5.3)
BKR SODIUM: 140 mmol/L (ref 136–144)
BKR WAM ABSOLUTE NRBC: 3.8 mmol/L (ref 3.3–5.3)

## 2020-06-21 LAB — TROPONIN T     (Q): BKR TROPONIN T: 0.08 ng/mL — ABNORMAL HIGH (ref <0.01)

## 2020-06-21 MED ORDER — IPRATROPIUM 0.5 MG-ALBUTEROL 3 MG (2.5 MG BASE)/3 ML NEBULIZATION SOLN
0.5 mg-3 mg(2.5 mg base)/3 mL | Freq: Four times a day (QID) | RESPIRATORY_TRACT | Status: DC
Start: 2020-06-21 — End: 2020-06-24
  Administered 2020-06-22 – 2020-06-23 (×4): 0.5 mL via RESPIRATORY_TRACT

## 2020-06-21 MED ORDER — GADOTERATE MEGLUMINE 0.5 MMOL/ML (376.9 MG/ML) INTRAVENOUS SOLUTION
0.5 mmol/mL (376.9 mg/mL) | Freq: Once | INTRAVENOUS | Status: CP | PRN
Start: 2020-06-21 — End: ?
  Administered 2020-06-21: 18:00:00 0.5 mL via INTRAVENOUS

## 2020-06-21 MED ORDER — ALBUTEROL SULFATE 2.5 MG/3 ML (0.083 %) SOLUTION FOR NEBULIZATION
2.5 mg /3 mL (0.083 %) | Freq: Four times a day (QID) | RESPIRATORY_TRACT | Status: DC | PRN
Start: 2020-06-21 — End: 2020-06-24

## 2020-06-21 MED ORDER — MIDODRINE 2.5 MG TABLET
2.5 mg | Freq: Three times a day (TID) | ORAL | Status: DC
Start: 2020-06-21 — End: 2020-06-24
  Administered 2020-06-21 – 2020-06-23 (×6): 2.5 mg via ORAL

## 2020-06-21 MED ORDER — PREDNISONE 50 MG TABLET
50 mg | Freq: Every day | ORAL | Status: DC
Start: 2020-06-21 — End: 2020-06-24
  Administered 2020-06-22 – 2020-06-23 (×2): 50 mg via ORAL

## 2020-06-21 NOTE — Plan of Care
Plan of Care Overview/ Patient Status    0700-1900: A&Ox4, hypotensive on 4L NC, other VSS, complaint of abd pain, prn and scheduled meds administered w/ + effect. LBM: 10/26, liquid per pt, refusing bowel regimen at this time. 3+ edema to BLE, compression stockings in place, pt educated to elevate legs while in the chair. Blood glucose monitored. Pills whole. OOB SBA. Chair alarm activated, hourly rounding complete, safety maintained.1015: Sating 88% on 2L NC, sating 93% on 4L NC, provider aware.1215: Pt off unit to MRI1430: Pt back on unit, reattached to wall O2.Problem: Adult Inpatient Plan of Care:Goal: Plan of Care ReviewOutcome: Interventions implemented as appropriateGoal: Patient-Specific Goal (Individualized)Outcome: Interventions implemented as appropriateGoal: Absence of Hospital-Acquired Illness or InjuryOutcome: Interventions implemented as appropriateGoal: Optimal Comfort and WellbeingOutcome: Interventions implemented as appropriateGoal: Readiness for Transition of CareOutcome: Interventions implemented as appropriate Problem: InfectionGoal: Absence of Infection Signs and SymptomsOutcome: Interventions implemented as appropriate Problem: Restraint, Nonbehavioral (Nonviolent)Goal: Discontinuation Criteria AchievedOutcome: Interventions implemented as appropriate Problem: Skin Injury Risk IncreasedGoal: Skin Health and IntegrityOutcome: Interventions implemented as appropriate Problem: Fall Injury RiskGoal: Absence of Fall and Fall-Related InjuryOutcome: Interventions implemented as appropriate Problem: Impaired Wound HealingGoal: Optimal Wound HealingOutcome: Interventions implemented as appropriate

## 2020-06-21 NOTE — Progress Notes
[facility] Medicine Progress NoteAttending Provider: Roger Kill, MD [phone]                                            Subjective: Interim History: Patient reports feeling better today. Explains his weight is significantly down since admission and his BLE edema has improved. Reports his abdominal pain is currently tolerable as he is staying on top of his pain medication. HD BM with suppository yesterday. No SOB. No CP. Review of Allergies/Meds/Hx: Review of Allergies/Meds/Hx:I have reviewed the patient's: current scheduled medications, current infusions, current prn medications and past medical historyObjective: Vitals:I have reviewed the patient's current vital signs as documented in the patient's EMR.   and Last 24 hours:Temp:  [97.6 ?F (36.4 ?C)] 97.6 ?F (36.4 ?C)Pulse:  [41-81] 81Resp:  [18-20] 20BP: (90-109)/(53-61) 90/53SpO2:  [90 %-98 %] 97 %I/O's:I have reviewed the patient's current I&O's as documented in the EMR.Gross Totals (Last 24 hours) at 06/21/2020 0808Last data filed at 06/21/2020 0636Intake 480 ml Output 1200 ml Net -720 ml Procedures:None Physical Exam:Physical ExamConstitutional:     Comments: Pleasant male sitting up in the chair in NAD HENT:    Head: Normocephalic. Cardiovascular:    Rate and Rhythm: Normal rate and regular rhythm. Pulmonary:    Effort: Pulmonary effort is normal.    Breath sounds: Normal breath sounds. No wheezing or rales. Abdominal:    General: Bowel sounds are normal.    Palpations: Abdomen is soft. Musculoskeletal:    Right lower leg: Edema present.    Left lower leg: Edema present.    Comments: Compression socks in place bilaterally Neurological:    Mental Status: He is oriented to person, place, and time. 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 Plan: HF exacerbation- Appreciate cardiology consult- s/p IV Lasix 60 mg IV BID yesterday. Slight bump in Cr today, would keep daily dosing for today- Continue lisinopril 2.5 mg daily- Continue metoprolol 12.5 mg daily - Continue ASA and statin- Monitor I/Os and daily weights- Work to wean off O2 - patient on RA at baselineMyocarditis- cMRI to eval status of myocarditis - scheduled for this afternoon- Continue prednisone taper - decrease by 10 mg weekly (On Wednesdays per patient)- Continue Bactrim ppxLeukocytosis- iso high dose steroids Hyperglycemia- iso steroid use- Continue ISSAbdominal pain- iso bladder ca and constipation- Suppository ordered x1- Continue Oxycontin and PRN oxycodoneBladder ca- Pain control as above- Continue Flomax 0.4 mg QHS- Continue oxybutynin 5 mg dailyOSA- CPAP ordered Code Status: Full code/ACLSMed Rec: completeDiet: Cardiac DMPPx: SQ LovenoxDispo: pending improvement in volume status  Electronically Signed:Rock Sobol, PA-CContact via MHB10/26/2021 8:08 AM

## 2020-06-21 NOTE — Progress Notes
[facility]     Medicine Service Progress Note     Date of Admission: 06/16/2020  Attending Provider: Roger Kill, MD    Interim Events:   Overnight: NAEO     This AM:  - He feels better this morning. Reports significant improvement in his SOB.   - Now on 4L O2.       Meds:   Scheduled Meds:  Current Facility-Administered Medications   Medication Dose Route Frequency Provider Last Rate Last Admin   ? aspirin EC delayed release tablet 81 mg  81 mg Oral Daily Tim Lair, MD   81 mg at 06/21/20 1610   ? calcium-vitamin D 500 mg-200 units tablet  1 tablet Oral Daily Moreines, Reynolds Bowl, MD   1 tablet at 06/21/20 [phone]   ? enoxaparin (LOVENOX) syringe 40 mg  40 mg Subcutaneous Q12H Burnett Corrente, [place]   40 mg at 06/21/20 5409   ? escitalopram oxalate (LEXAPRO) tablet 10 mg  10 mg Oral Nightly Moreines, Reynolds Bowl, MD   10 mg at 06/20/20 2103   ? [Held by provider] furosemide (LASIX) injection 60 mg  60 mg Intravenous Q24H Peri Jefferson, PA   60 mg at 06/21/20 1014   ? gabapentin (NEURONTIN) capsule 600 mg  600 mg Oral Nightly Tim Lair, MD   600 mg at 06/20/20 2103   ? insulin lispro (Admelog, HumaLOG) Sliding Scale (See admin instructions for dose)   Subcutaneous TID AC Moreines, Reynolds Bowl, MD   2 Units at 06/20/20 1829   ? insulin lispro (Admelog, HumaLOG) Sliding Scale (See admin instructions for dose)   Subcutaneous Nightly Ramos, Vida Roller, MD       ? ipratropium-albuteroL (DUO-NEB) 0.5 mg-3 mg(2.5 mg base)/3 mL nebulizer solution 3 mL  3 mL Nebulization Q6H WA Roger Kill, MD       ? [Held by provider] lisinopriL (PRINIVIL,ZESTRIL) tablet 2.5 mg  2.5 mg Oral Daily Moreines, Reynolds Bowl, MD   2.5 mg at 06/21/20 8119   ? [Held by provider] metoprolol succinate (TOPROL-XL) 24 hr tablet 12.5 mg  12.5 mg Oral Daily Moreines, Reynolds Bowl, MD   12.5 mg at 06/21/20 1478   ? midodrine (PROAMATINE) tablet 2.5 mg  2.5 mg Oral TID WC Haq, Jess Barters, MD   2.5 mg at 06/21/20 1558   ? nicotine (NICODERM CQ) transdermal patch 24 hr 21 mg  21 mg Transdermal Daily Tim Lair, MD   21 mg at 06/21/20 2956   ? oxybutynin XL (DITROPAN-XL) 24 hr tablet 5 mg  5 mg Oral Daily Moreines, Reynolds Bowl, MD   5 mg at 06/21/20 2130   ? oxyCODONE (OxyCONTIN) 12 hr tablet 10 mg  10 mg Oral Q12H Moreines, Reynolds Bowl, MD   10 mg at 06/21/20 8657   ? pantoprazole (PROTONIX) EC tablet 40 mg  40 mg Oral Daily Moreines, Reynolds Bowl, MD   40 mg at 06/21/20 8469   ? polyethylene glycol (MIRALAX) packet 17 g  17 g Oral Daily Verlee Rossetti, APRN   17 g at 06/20/20 6295   ? [START ON 06/22/2020] predniSONE (DELTASONE) tablet 100 mg  100 mg Oral Daily Peri Jefferson, PA       ? rosuvastatin (CRESTOR) tablet 40 mg  40 mg Oral Daily Tim Lair, MD   40 mg at 06/21/20 2841   ? senna-docusate (SENNA-PLUS) 8.6-50 mg per tablet 1 tablet  1 tablet  Oral BID Tim Lair, MD   1 tablet at 06/20/20 2103   ? sodium chloride 0.9 % flush 3 mL  3 mL IV Push Q8H Moreines, Reynolds Bowl, MD   3 mL at 06/19/20 2053   ? sulfamethoxazole-trimethoprim (BACTRIM DS;CO-TRIMOXAZOLE DS) 800-160 mg per tablet 1 tablet  1 tablet Oral Once per day on Mon Wed Fri Moreines, Reynolds Bowl, MD   1 tablet at 06/20/20 0945   ? tamsulosin (FLOMAX) 24 hr capsule 0.4 mg  0.4 mg Oral Nightly Moreines, Reynolds Bowl, MD   0.4 mg at 06/20/20 2104     Continuous Infusions:  PRN Meds:acetaminophen, albuterol, carboxymethylcellulose, dextrose 50% in water (D50W), dextrose 50% in water (D50W), Enteral Hypoglycemia Management if Blood Glucose 50 - 69 mg/dL or 70 - 79 mg/dL with symptoms of Hypoglycemia, Enteral Hypoglycemia Management if Blood Glucose less than 50 mg/dL, glucagon, LORazepam, nicotine (polacrilex), oxyCODONE, polyethylene glycol, simethicone, sodium chloride    Objective:     Vitals  Temp:  [97.6 ?F (36.4 ?C)-98.2 ?F (36.8 ?C)] 98.2 ?F (36.8 ?C)  Pulse:  [60-86] 60  Resp:  [18-20] 20  BP: (90-124)/(53-74) 124/74  SpO2:  [88 %-98 %] 96 %  Device (Oxygen Therapy): nasal cannula  O2 Flow (L/min):  [2-4] 4    I/O    Gross Totals (Last 24 hours) at 06/21/2020 1625  Last data filed at 06/21/2020 1300  Intake 480 ml   Output 1350 ml   Net -870 ml       Weight  Wt:   06/20/20 132.6 kg   06/13/20 (!) 140.6 kg   06/09/20 (!) 141.7 kg   06/02/20 (!) 136.9 kg   05/27/20 (!) 138.3 kg   05/21/20 (!) 139 kg       Physical Exam:  Gen: in NAD  HEENT: NCAT, EOMI, MMM   CV: RRR, nl s1/s2, no m/r/g , JVP elevated    Pulm: bibasilar crackles,  no wheezing or rubs.   Abd: +BS, soft, NTND. No rebound or guarding   Ext: Bilateral 1-2+Pitting LE edema. SCD in place.   Neuro: AOx4, CN 2-12 grossly intact    Labs:     CBC  Recent Labs   Lab 06/18/20  0607 06/18/20  0607 06/19/20  0556 06/19/20  0556 06/20/20  0533   WBC 21.1*  --  20.7*  --  22.2*   HGB 10.3*   < > 10.1*   < > 11.0*   HCT 29.9*   < > 29.7*   < > 31.4*   PLT 249   < > 248   < > 246   MCV 94.9*   < > 96.4*   < > 97.5*    < > = values in this interval not displayed.    Recent Labs   Lab 06/16/20  2321 06/17/20  1333 06/18/20  0607   NEUTROPHILS 91.0* 92.7* 86.5*      Chemistry  Recent Labs   Lab 06/19/20  0556 06/19/20  0751 06/20/20  0533 06/20/20  0812 06/21/20  0633 06/21/20  0746 06/21/20  1225   NA 140  --  140  --  140  --   --    K 4.0   < > 4.4   < > 3.8  --   --    CL 99   < > 98   < > 99  --   --    CO2 27   < > 31*   < >  30  --   --    BUN 40*   < > 44*   < > 48*  --   --    CREATININE 1.66*   < > 1.69*   < > 1.87*  --   --    GLU 127*   < > 105*   < > 130*   < > 205*   ANIONGAP 14   < > 11   < > 11  --   --     < > = values in this interval not displayed.    Recent Labs   Lab 06/17/20  0654 06/19/20  0556 06/20/20  0533 06/21/20  1610   CALCIUM  --  8.7* 9.0 8.7*   MG   < >  --  2.3  --    PHOS  --   --  3.6  --     < > = values in this interval not displayed.      LFTs  Recent Labs   Lab 06/16/20  1654 06/16/20  1654 06/16/20  2321 06/16/20  2321 06/17/20  1333   ALT 34  --  33  --  33   AST 13   < > 19   < > 21   ALKPHOS 118   < > 128*   < > 125*   BILITOT 0.2   < > <0.2   < > 0.3   BILIDIR  --   --  <0.2   < > <0.2    < > = values in this interval not displayed.    Coags  Recent Labs   Lab 06/16/20  2321   LABPROT 10.3   INR 0.94   DDIMER 1.78*        Pending Lab Results     Order Current Status    COVID-19 Clearance or for Placement Only Collected (06/21/20 1511)        Micro: no new    Imaging: no new    Assessment & Plan:   Paul Hunter is a 69 y.o. 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 Echo done in this admission was evident for EF of 55-60%, no regional WMA with dilated RV with mild RV dysfunction. Responding well to diuretics with improvement in his respiratory status. He is planned to undergo another MRI to evaluate the course of myocarditis. Although he has baseline ischemic heart disease and ICIs can accelerate CAD we do not suspect active ACS at this time and would recommend against LHC. Notably his hematuria makes [place] less hesitant to commit him to DAPT as well.     Recommendations:   - Cont diuresing to keep net negative 1L with strict Is/Os, volume assessment and monitoring kidney functions.  - follow up on CMRI report  - Continue strict intake and output and daily standing weights  - Continue aspirin, statin, lisinpril, metoprolol and steroids  - Further plans pending results of MRI    PCP: Name), No Pcp (Do Not Change    CONTACT:  Primary Emergency Contact: AUSTIN,[name], Home Phone: [phone]    Signed:  Charlotte Crumb MD-MPH  PGY-2 Internal Medicine  MHB: [phone]  June 21, 2020     I personally interviewed and examined the patient, reviewed clinical data, including laboratory data, current  medications, vital signs/hemodynamic data and imaging data, and directly supervised and participated in the development of the specific assessment and treatment plans documented in the note above. I independently verified and agree with the documented physical exam. I directly participated in and supervised development of the assessment and treatment plan. Exceptions/additional comments noted in above bullets. If you have any questions, please do not hesitate to contact me.     As above.  Patient clinically improved, edema improved, weight down, much less short of breath.  He is responding to IV diuresis.  Await results of repeat cardiac MRI.    Charleston Ropes, MD [facility]  Clinician [facility]

## 2020-06-21 NOTE — Other
-    CONSULT  REQUEST  DOCUMENTATION  -  [facility] NOTE  -  Type of consult: [facility]   -  New Consult: [id number] Onalee Hua J Bulson /Location: 9831/9831-B / Patient status: Established patientReason for Consult? REadmitted. NOted your plans to start next line of t/m at 2w mark which is 10/28. Also patient increasingly hopeless, what is his prognosis and would you like pall care involved?/Callback Cell Phone: [phone] / Please confirm receipt of this message by texting back ?OK?  -  1 - Mobile Heartbeat message sent to Audery Amel at 10:43 AM. Received response at 1049.  Alycia Rossetti  06/21/2020  10:43 AM  Consult [facility] [phone]

## 2020-06-21 NOTE — Other
PHARMACY-ASSISTED MEDICATION REPORT    Pharmacist review of the best possible medication history obtained by the pharmacy medication history technician has been performed.      I have updated the home medication list and identified the following information that may be relevant to this admission.      NOTES/RECOMMENDATIONS  All home medications reviewed and no recommendations at this time.                     Prior to Admission Medications     Medication Name Sig Taking? Patient Reported       albuterol sulfate 90 mcg/actuation HFA aerosol inhaler  Last dose:  --  Inhale 2 puffs into the lungs every 6 (six) hours as needed for wheezing or shortness of breath. Yes         aspirin 81 MG EC tablet  Last dose:  --  Take 81 mg by mouth daily. Yes Yes       atorvastatin (LIPITOR) 80 mg tablet  Last dose:  --  Take 1 tablet (80 mg total) by mouth nightly. Yes         belladonna alkaloids-opium (B&O SUPPRETTES) 16.2-30 mg suppository  Last dose:  --   Last Medication Note: >> Francis Dowse   Fri Jun 17, 2020  5:29 PM  [facility] Tech(Caitlin Cherre Blanc, CPHT):  Patient reported this prescription was not picked up yet    Entered by Francis Dowse, CPHT Fri Jun 17, 2020 1729 Place 1 suppository rectally 2 (two) times daily as needed for bladder spasm.           calcium carbonate-vitamin D3 500 mg(1,250mg ) -400 unit per tablet  Last dose:  --  Take 1 tablet by mouth 2 (two) times daily. Yes         carboxymethylcellulose (REFRESH PLUS) 0.5 % ophthalmic solution in dropperette  Last dose:  --  Place 1 drop into both eyes 3 (three) times daily as needed. Yes Yes       clopidogreL (PLAVIX) 75 mg tablet  Last dose:  --  Take 75 mg by mouth daily. Yes Yes       escitalopram oxalate (LEXAPRO) 10 mg tablet  Last dose:  --  Take 1 tablet (10 mg total) by mouth nightly. Yes         fexofenadine (ALLEGRA) 180 mg tablet  Last dose:  --   Last Medication Note: >> Cristina Gong   WUJ May 28, 2020  3:32 PM     Entered by Cristina Gong, CPHT Sat May 28, 2020 1532 Take 180 mg by mouth daily as needed.  Yes Yes       furosemide (LASIX) 40 mg tablet  Last dose:  --  Take 0.5 tablets (20 mg total) by mouth daily. Yes         gabapentin (NEURONTIN) 300 mg capsule  Last dose:  --  Take 2 capsules (600 mg total) by mouth nightly. Yes         lisinopriL (PRINIVIL,ZESTRIL) 2.5 mg tablet  Last dose:  --   Last Medication Note: >> LU, Calton Golds Jun 02, 2020  9:25 AM     Entered by Benetta Spar, MD Thu Jun 02, 2020 0925 Take 2.5 mg by mouth daily. Yes Yes       LORazepam (ATIVAN) 0.5 mg tablet  Last dose:  --   Last Medication Note: >> LU, BENJAMIN   Thu Jun 02, 2020  9:25 AM  Entered by Benetta Spar, MD Thu Jun 02, 2020 0925 Take 1 tablet (0.5 mg total) by mouth every 6 (six) hours as needed for anxiety. Yes         metoprolol succinate XL (TOPROL-XL) 25 mg 24 hr tablet  Last dose:  --   Last Medication Note: >> MULINSKI, TINA M.   Fri Jun 03, 2020 11:31 AM     Entered by Jeanelle Malling., APRN Caleen Essex Jun 03, 2020 1131 Take 0.5 tablets (12.5 mg total) by mouth daily. Take with or immediately following a meal. Yes         nicotine (NICODERM CQ) 21 mg transdermal patch  Last dose:  --   Last Medication Note: >> Francis Dowse   Fri Jun 17, 2020  5:30 PM  [employer](CF): Patient reports medication is located ON LEFT ARM since 06/17/2020 put ON ARM since 06/17/2020 AM  Entered by Francis Dowse, CPHT Fri Jun 17, 2020 1730 APPLY 1 PATCH DAILY. ROTATE PATCH SITES. LEAVE ON FOR 24 HOURS UNLESS OTHERWISE DIRECTED. Yes Yes       nicotine polacrilex (COMMIT) 2 mg lozenge  Last dose:  --   Last Medication Note: >> Francis Dowse   Fri Jun 17, 2020  5:31 PM  [employer](CF): Patient reports medication is NOT taken normally at home but since he is in the hospital he will probably need to take medication  Entered by Francis Dowse, CPHT Fri Jun 17, 2020 1731 Place 1 lozenge (2 mg total) inside cheek every 2 (two) hours as needed for smoking cessation. Yes         oxybutynin XL (DITROPAN-XL) 5 mg 24 hr tablet  Last dose:  --  Take 1 tablet (5 mg total) by mouth daily. Yes         oxyCODONE (OXYCONTIN) 10 mg 12 hr extended release tablet  Last dose:  --  Take 1 tablet (10 mg total) by mouth every 12 (twelve) hours. Yes         oxyCODONE (ROXICODONE) 5 mg Immediate Release tablet  Last dose:  --  Take 1 tablet (5 mg total) by mouth every 3 (three) hours as needed for up to 10 doses. Yes         pantoprazole (PROTONIX) 40 mg tablet  Last dose:  --  Take 1 tablet (40 mg total) by mouth daily. Yes         polyethylene glycol (MIRALAX) 17 gram packet  Last dose:  --  Take 1 packet (17 g total) by mouth daily as needed for constipation. Mix in 8 ounces of water, juice, soda, coffee or tea prior to taking. Yes         predniSONE (DELTASONE) 10 mg tablet  Last dose:  --   Last Medication Note: >> Francis Dowse   Fri Jun 17, 2020  5:33 PM  [employer](CF): Patient reports taking medication, patient reports currently taking 10 Tablets(100 MG) until NEXT Tuesday 06/21/2020 and will taper down to 9 tablets for another week  Entered by Francis Dowse, CPHT Fri Jun 17, 2020 1733 Take 3 tablets (30 mg total) by mouth daily for 7 days, THEN 2 tablets (20 mg total) daily for 7 days, THEN 1 tablet (10 mg total) daily for 7 days, THEN 0.5 tablets (5 mg total) daily for 7 days, THEN 0.5 tablets (5 mg total) daily for 7 days. Take with food.. Yes         predniSONE (DELTASONE) 10 mg tablet  Last dose:  --  Last Medication Note: >> Francis Dowse   Fri Jun 17, 2020  5:34 PM  [employer](CF): Patient currently taking 10 tablets (100 MG) Daily, see additional note as prescription is written in twice  Entered by Francis Dowse, CPHT Fri Jun 17, 2020 1734 Take 13 tablets (130 mg total) by mouth daily for 7 days, THEN 12 tablets (120 mg total) daily for 7 days, THEN 11 tablets (110 mg total) daily for 7 days, THEN 10 tablets (100 mg total) daily for 7 days, THEN 9 tablets (90 mg total) daily for 7 days, THEN 8 tablets (80 mg total) daily for 7 days, THEN 7 tablets (70 mg total) daily for 7 days, THEN 6 tablets (60 mg total) daily for 7 days, THEN 5 tablets (50 mg total) daily for 7 days, THEN 4 tablets (40 mg total) daily for 7 days. Take with food..           predniSONE (DELTASONE) 50 mg tablet  Last dose:  --   Last Medication Note: >> Francis Dowse   Fri Jun 17, 2020  5:34 PM  [employer](CF): Patient currently NOT ON this dosage yet see other note  Entered by Francis Dowse, CPHT Fri Jun 17, 2020 1734 Take with food.           senna-docusate (SENNA-PLUS) 8.6-50 mg tablet  Last dose:  --   Last Medication Note: >> LU, Calton Golds Jun 02, 2020  9:27 AM     Entered by Benetta Spar, MD Thu Jun 02, 2020 0927 Take 1 tablet by mouth nightly. Yes         simethicone (MYLICON) 80 mg chewable tablet  Last dose:  --   Last Medication Note: >> LU, BENJAMIN   Thu Jun 02, 2020  9:29 AM     Entered by Benetta Spar, MD Thu Jun 02, 2020 0929 Take 1 tablet (80 mg total) by mouth every 6 (six) hours as needed for flatulence. Yes         sulfamethoxazole-trimethoprim (BACTRIM DS;CO-TRIMOXAZOLE DS) 800-160 mg per tablet  Last dose:  --   Last Medication Note: >> Francis Dowse   Fri Jun 17, 2020  5:35 PM  [facility] Tech(Caitlin Cherre Blanc, CPHT):  Patient reported this prescription was not picked up yet    Entered by Francis Dowse, CPHT Fri Jun 17, 2020 1735 Take 1 tablet by mouth Every Monday, Wednesday, and Friday.           tamsulosin (FLOMAX) 0.4 mg 24 hr capsule  Last dose:  --  Take 1 capsule (0.4 mg total) by mouth nightly. Yes         walker Misc  Last dose:  --  Use as directed.           Prior to admission medications last reviewed by Francis Dowse, CPHT on Fri Jun 17, 2020 1737         Thank you,  Dickey Gave, PharmD, BCPS  06/21/2020  2:46 PM  Phone: [facility]

## 2020-06-21 NOTE — Plan of Care
Plan of Care Overview/ Patient Status    1900-0700Alert and oriented x4. VSS on room air. Weaned from 2L NC. Abdominal pain well controlled with scheduled OxyContin and PRN oxycodone. NSR on telemetry; HR 60s-90s. MRI checklist completed. OOB with SBA and RW. Slept in chair overnight. Indepednent with bed/chair mobility. Safety maintained. Hourly rounding completed. See flowsheets for assessments.

## 2020-06-22 ENCOUNTER — Encounter: Admit: 2020-06-22 | Payer: PRIVATE HEALTH INSURANCE | Attending: Medical Oncology

## 2020-06-22 ENCOUNTER — Inpatient Hospital Stay: Admit: 2020-06-22 | Payer: PRIVATE HEALTH INSURANCE

## 2020-06-22 DIAGNOSIS — R0602 Shortness of breath: Secondary | ICD-10-CM

## 2020-06-22 LAB — BASIC METABOLIC PANEL
BKR ANION GAP: 13 (ref 7–17)
BKR ANION GAP: 13 (ref 7–17)
BKR BLOOD UREA NITROGEN: 44 mg/dL — ABNORMAL HIGH (ref 8–23)
BKR BLOOD UREA NITROGEN: 43 mg/dL — ABNORMAL HIGH (ref 8–23)
BKR BUN / CREAT RATIO: 22.6 (ref 8.0–23.0)
BKR BUN / CREAT RATIO: 24.3 — ABNORMAL HIGH (ref 8.0–23.0)
BKR CALCIUM: 9.2 mg/dL (ref 8.8–10.2)
BKR CALCIUM: 9.1 mg/dL (ref 8.8–10.2)
BKR CHLORIDE: 98 mmol/L (ref 98–107)
BKR CHLORIDE: 99 mmol/L (ref 98–107)
BKR CO2: 27 mmol/L (ref 20–30)
BKR CO2: 26 mmol/L (ref 20–30)
BKR CREATININE: 1.95 mg/dL — ABNORMAL HIGH (ref 0.40–1.30)
BKR CREATININE: 1.77 mg/dL — ABNORMAL HIGH (ref 0.40–1.30)
BKR EGFR (AFR AMER): 42 mL/min/1.73m2 (ref >60)
BKR EGFR (AFR AMER): 47 mL/min/{1.73_m2} (ref >60)
BKR EGFR (NON AFRICAN AMERICAN): 34 mL/min/{1.73_m2} (ref >60)
BKR EGFR (NON AFRICAN AMERICAN): 39 mL/min/{1.73_m2} (ref >60)
BKR GLUCOSE: 101 mg/dL — ABNORMAL HIGH (ref 70–100)
BKR GLUCOSE: 212 mg/dL — ABNORMAL HIGH (ref 70–100)
BKR POTASSIUM: 5.1 mmol/L (ref 3.3–5.3)
BKR SODIUM: 138 mmol/L (ref 136–144)
BKR SODIUM: 138 mmol/L (ref 136–144)
BKR WAM RED BLOOD CELL COUNT: 9.2 mg/dL (ref 8.8–10.2)

## 2020-06-22 LAB — OSMOLALITY, URINE, RANDOM: BKR OSMOLALITY, URINE, RANDOM: 756 mOsm/kg (ref 300–900)

## 2020-06-22 LAB — UREA NITROGEN, URINE, RANDOM     (BH GH LMW Q YH): BKR UREA NITROGEN, URINE, RANDOM: 1378 mg/dL

## 2020-06-22 LAB — COVID-19 CLEARANCE OR FOR PLACEMENT ONLY: BKR SARS-COV-2 RNA (COVID-19) (YH): NEGATIVE

## 2020-06-22 LAB — POTASSIUM, URINE, RANDOM     (BH GH L LMW YH): BKR POTASSIUM, URINE, RANDOM: 41 mmol/L

## 2020-06-22 LAB — SODIUM, URINE, RANDOM W/O CREATININE: BKR SODIUM, URINE RANDOM: 64 mmol/L

## 2020-06-22 LAB — CREATININE, URINE, RANDOM: BKR CREATININE, URINE, RANDOM: 162 mg/dL

## 2020-06-22 MED ORDER — FUROSEMIDE 10 MG/ML INJECTION SOLUTION
10 mg/mL | INTRAVENOUS | Status: DC
Start: 2020-06-22 — End: 2020-06-24

## 2020-06-22 MED ORDER — MYCOPHENOLATE MOFETIL 500 MG TABLET
500 mg | Freq: Two times a day (BID) | ORAL | Status: DC
Start: 2020-06-22 — End: 2020-06-24
  Administered 2020-06-22 – 2020-06-23 (×3): 500 mg via ORAL

## 2020-06-22 NOTE — Plan of Care
Plan of Care Overview/ Patient Status    0700-1900A&O x4. VSS on 1L NC, provider Romano titrated pt down from 4L NC in AM during shift, pt satting well on 1L NC, O2 sat check on pts ear for most accurate reading. Pt complained of abdominal pain, PRN oxycodone given with therapeutic effect. Cardiac CC diet, pills whole, insulin coverage given, good PO intake. Lung sounds are diminished, no SOB reported. Heart rate and rhythm WDL, tele monitoring. Pt is continent of B&B, LBM 10/27 during shift, urinal at bedside. SBA OOB with RW. See flowsheets for skin, slight edema in bilateral lower extremities. Chair/ bed alarm on. Hourly rounding. ADDM 1412: Pt fell during shift, Provider notified and came to bedside to assess, vitals taken, vitals WDL, Willey obtained. See fall note for more details on event. Problem: Adult Inpatient Plan of CareGoal: Plan of Care Review10/27/2021 1941 by Beckey Downing F, RNOutcome: Interventions implemented as appropriate10/27/2021 1502 by Beckey Downing F, RNOutcome: Interventions implemented as appropriateGoal: Patient-Specific Goal (Individualized)06/22/2020 1941 by Beckey Downing F, RNOutcome: Interventions implemented as appropriate10/27/2021 1502 by Beckey Downing F, RNOutcome: Interventions implemented as appropriateGoal: Absence of Hospital-Acquired Illness or Injury10/27/2021 1941 by Beckey Downing F, RNOutcome: Interventions implemented as appropriate10/27/2021 1502 by Beckey Downing F, RNOutcome: Interventions implemented as appropriateGoal: Optimal Comfort and Wellbeing10/27/2021 1941 by Beckey Downing F, RNOutcome: Interventions implemented as appropriate10/27/2021 1502 by Beckey Downing F, RNOutcome: Interventions implemented as appropriateGoal: Readiness for Transition of Care10/27/2021 1941 by Beckey Downing F, RNOutcome: Interventions implemented as appropriate10/27/2021 1502 by Beckey Downing F, RNOutcome: Interventions implemented as appropriate Problem: InfectionGoal: Absence of Infection Signs and Symptoms10/27/2021 1941 by Beckey Downing F, RNOutcome: Interventions implemented as appropriate10/27/2021 1502 by Beckey Downing F, RNOutcome: Interventions implemented as appropriate Problem: Restraint, Nonbehavioral (Nonviolent)Goal: Discontinuation Criteria Achieved10/27/2021 1941 by Beckey Downing F, RNOutcome: Interventions implemented as appropriate10/27/2021 1502 by Beckey Downing F, RNOutcome: Interventions implemented as appropriate Problem: Skin Injury Risk IncreasedGoal: Skin Health and Integrity10/27/2021 1941 by Beckey Downing F, RNOutcome: Interventions implemented as appropriate10/27/2021 1502 by Beckey Downing F, RNOutcome: Interventions implemented as appropriate Problem: Fall Injury RiskGoal: Absence of Fall and Fall-Related Injury10/27/2021 1941 by Beckey Downing F, RNOutcome: Interventions implemented as appropriate10/27/2021 1502 by Beckey Downing F, RNOutcome: Interventions implemented as appropriate Problem: Impaired Wound HealingGoal: Optimal Wound Healing10/27/2021 1941 by Beckey Downing F, RNOutcome: Interventions implemented as appropriate10/27/2021 1502 by Beckey Downing F, RNOutcome: Interventions implemented as appropriate

## 2020-06-22 NOTE — Other
CARDIOLOGY CONSULT NOTE  =========================================================================   Consulting Attending Physician: Roger Kill, MD  Admission Diagnosis: Shortness of breath [R06.02]  Elevated troponin [R77.8]  Coronary artery disease involving native coronary artery of native heart with angina pectoris (HC Code) (HC CODE) [I25.119]  Hypervolemia, unspecified hypervolemia type [E87.70]  Other subacute myocarditis [I40.8]  =========================================================================   Yesterday:  -S/p lasix 60 yesterday AM  -Net negative 980 mL for yesterday    Today:  -Weight stable 132.6 kg from yesterday  -Cr elevated 1.95 vs yesterday 1.87 baseline from September around 1.4-1.5  -Feels his breathing is good, feels ready to ambulate    PMH PSH   Past Medical History:   Diagnosis Date   ? Anxiety    ? CAD (coronary artery disease)    ? Diabetes mellitus (HC Code) (HC CODE)    ? Difficult intubation 05/22/2020    Patient with significant soft tissue in oropharynx. Glidescope intubation revealed anterior cords with floppy soft tissue obstruction. Patient will require video laryngoscopy for airway management going forward.   ? GERD (gastroesophageal reflux disease)    ? Hyperlipidemia    ? Hypertension    ? Hypertrophy of prostate without urinary obstruction and other lower urinary tract symptoms (LUTS)    ? Other testicular hypofunction    ? Spinal stenosis 09/28/2018   ? Tobacco abuse    ? Vitamin D deficiency     Past Surgical History:   Procedure Laterality Date   ? CAROTID STENT  08/28/2003   ? CHOLECYSTECTOMY  11/07/2016   ? KNEE SURGERY      torn meniscus   ? LAMINECTOMY  10/07/2008   ? LUMBAR DISC SURGERY      herniated disc      Social History Family History   Social History     Tobacco Use   ? Smoking status: Current Every Day Smoker     Types: Cigarettes   Substance Use Topics   ? Alcohol use: Not on file   ? Drug use: Not on file     Social History     Substance and Sexual Activity   Drug Use Not on file    No family history on file.       Prior to Admission Medications   Prior to Admission Medication   Medication Sig Dispense Refill   ? albuterol sulfate 90 mcg/actuation HFA aerosol inhaler Inhale 2 puffs into the lungs every 6 (six) hours as needed for wheezing or shortness of breath. 6.7 g 0   ? aspirin 81 MG EC tablet Take 81 mg by mouth daily.     ? atorvastatin (LIPITOR) 80 mg tablet Take 1 tablet (80 mg total) by mouth nightly. 30 tablet 6   ? calcium carbonate-vitamin D3 500 mg(1,250mg ) -400 unit per tablet Take 1 tablet by mouth 2 (two) times daily. 60 tablet 2   ? carboxymethylcellulose (REFRESH PLUS) 0.5 % ophthalmic solution in dropperette Place 1 drop into both eyes 3 (three) times daily as needed.     ? clopidogreL (PLAVIX) 75 mg tablet Take 75 mg by mouth daily.     ? escitalopram oxalate (LEXAPRO) 10 mg tablet Take 1 tablet (10 mg total) by mouth nightly. 90 tablet 3   ? fexofenadine (ALLEGRA) 180 mg tablet Take 180 mg by mouth daily as needed.      ? furosemide (LASIX) 40 mg tablet Take 0.5 tablets (20 mg total) by mouth daily. 30 tablet 2   ? gabapentin (NEURONTIN) 300 mg  capsule Take 2 capsules (600 mg total) by mouth nightly. 60 capsule 0   ? lisinopriL (PRINIVIL,ZESTRIL) 2.5 mg tablet Take 2.5 mg by mouth daily.     ? LORazepam (ATIVAN) 0.5 mg tablet Take 1 tablet (0.5 mg total) by mouth every 6 (six) hours as needed for anxiety. 15 tablet 0   ? metoprolol succinate XL (TOPROL-XL) 25 mg 24 hr tablet Take 0.5 tablets (12.5 mg total) by mouth daily. Take with or immediately following a meal. 30 tablet 0   ? nicotine (NICODERM CQ) 21 mg transdermal patch APPLY 1 PATCH DAILY. ROTATE PATCH SITES. LEAVE ON FOR 24 HOURS UNLESS OTHERWISE DIRECTED.     ? nicotine polacrilex (COMMIT) 2 mg lozenge Place 1 lozenge (2 mg total) inside cheek every 2 (two) hours as needed for smoking cessation. 108 each 2   ? oxybutynin XL (DITROPAN-XL) 5 mg 24 hr tablet Take 1 tablet (5 mg total) by mouth daily. 90 tablet 0   ? oxyCODONE (OXYCONTIN) 10 mg 12 hr extended release tablet Take 1 tablet (10 mg total) by mouth every 12 (twelve) hours. 60 tablet 0   ? oxyCODONE (ROXICODONE) 5 mg Immediate Release tablet Take 1 tablet (5 mg total) by mouth every 3 (three) hours as needed for up to 10 doses. 200 tablet 0   ? pantoprazole (PROTONIX) 40 mg tablet Take 1 tablet (40 mg total) by mouth daily. 30 tablet 2   ? polyethylene glycol (MIRALAX) 17 gram packet Take 1 packet (17 g total) by mouth daily as needed for constipation. Mix in 8 ounces of water, juice, soda, coffee or tea prior to taking. 14 each 0   ? [START ON 08/09/2020] predniSONE (DELTASONE) 10 mg tablet Take 3 tablets (30 mg total) by mouth daily for 7 days, THEN 2 tablets (20 mg total) daily for 7 days, THEN 1 tablet (10 mg total) daily for 7 days, THEN 0.5 tablets (5 mg total) daily for 7 days, THEN 0.5 tablets (5 mg total) daily for 7 days. Take with food.. 49 tablet 0   ? senna-docusate (SENNA-PLUS) 8.6-50 mg tablet Take 1 tablet by mouth nightly. 30 tablet 0   ? simethicone (MYLICON) 80 mg chewable tablet Take 1 tablet (80 mg total) by mouth every 6 (six) hours as needed for flatulence. 30 tablet 0   ? tamsulosin (FLOMAX) 0.4 mg 24 hr capsule Take 1 capsule (0.4 mg total) by mouth nightly. 30 capsule 2   ? belladonna alkaloids-opium (B&O SUPPRETTES) 16.2-30 mg suppository Place 1 suppository rectally 2 (two) times daily as needed for bladder spasm. 12 suppository 0   ? predniSONE (DELTASONE) 10 mg tablet Take 13 tablets (130 mg total) by mouth daily for 7 days, THEN 12 tablets (120 mg total) daily for 7 days, THEN 11 tablets (110 mg total) daily for 7 days, THEN 10 tablets (100 mg total) daily for 7 days, THEN 9 tablets (90 mg total) daily for 7 days, THEN 8 tablets (80 mg total) daily for 7 days, THEN 7 tablets (70 mg total) daily for 7 days, THEN 6 tablets (60 mg total) daily for 7 days, THEN 5 tablets (50 mg total) daily for 7 days, THEN 4 tablets (40 mg total) daily for 7 days. Take with food.. 595 tablet 0   ? predniSONE (DELTASONE) 50 mg tablet Take with food. 56 tablet 0   ? sulfamethoxazole-trimethoprim (BACTRIM DS;CO-TRIMOXAZOLE DS) 800-160 mg per tablet Take 1 tablet by mouth Every Monday, Wednesday, and Friday. 28 tablet 0   ?  walker Misc Use as directed. 1 each 0        Current Medications   Scheduled Meds:  Current Facility-Administered Medications   Medication Dose Route Frequency Provider Last Rate Last Admin   ? aspirin EC delayed release tablet 81 mg  81 mg Oral Daily Tim Lair, MD   81 mg at 06/22/20 1610   ? calcium-vitamin D 500 mg-200 units tablet  1 tablet Oral Daily Moreines, Reynolds Bowl, MD   1 tablet at 06/22/20 [phone]   ? enoxaparin (LOVENOX) syringe 40 mg  40 mg Subcutaneous Q12H Burnett Corrente, [place]   40 mg at 06/22/20 5409   ? escitalopram oxalate (LEXAPRO) tablet 10 mg  10 mg Oral Nightly Tim Lair, MD   10 mg at 06/21/20 2000   ? [Held by provider] furosemide (LASIX) injection 40 mg  40 mg Intravenous Q24H Haq, Jess Barters, MD       ? gabapentin (NEURONTIN) capsule 600 mg  600 mg Oral Nightly Moreines, Reynolds Bowl, MD   600 mg at 06/21/20 2000   ? insulin lispro (Admelog, HumaLOG) Sliding Scale (See admin instructions for dose)   Subcutaneous TID AC Moreines, Reynolds Bowl, MD   2 Units at 06/21/20 1847   ? insulin lispro (Admelog, HumaLOG) Sliding Scale (See admin instructions for dose)   Subcutaneous Nightly Ramos, Vida Roller, MD       ? ipratropium-albuteroL (DUO-NEB) 0.5 mg-3 mg(2.5 mg base)/3 mL nebulizer solution 3 mL  3 mL Nebulization Q6H WA Roger Kill, MD   3 mL at 06/21/20 2022   ? [Held by provider] lisinopriL (PRINIVIL,ZESTRIL) tablet 2.5 mg  2.5 mg Oral Daily Moreines, Reynolds Bowl, MD   2.5 mg at 06/21/20 8119   ? [Held by provider] metoprolol succinate (TOPROL-XL) 24 hr tablet 12.5 mg  12.5 mg Oral Daily Moreines, Reynolds Bowl, MD   12.5 mg at 06/21/20 1478   ? midodrine (PROAMATINE) tablet 2.5 mg  2.5 mg Oral TID WC Roger Kill, MD   2.5 mg at 06/22/20 0811   ? mycophenolate mofetil (CELLCEPT) tablet 1 g  1 g Oral BID Roger Kill, MD   1 g at 06/22/20 2956   ? nicotine (NICODERM CQ) transdermal patch 24 hr 21 mg  21 mg Transdermal Daily Tim Lair, MD   21 mg at 06/22/20 2130   ? oxybutynin XL (DITROPAN-XL) 24 hr tablet 5 mg  5 mg Oral Daily Moreines, Reynolds Bowl, MD   5 mg at 06/22/20 8657   ? oxyCODONE (OxyCONTIN) 12 hr tablet 10 mg  10 mg Oral Q12H Moreines, Reynolds Bowl, MD   10 mg at 06/22/20 8469   ? pantoprazole (PROTONIX) EC tablet 40 mg  40 mg Oral Daily Tim Lair, MD   40 mg at 06/22/20 6295   ? polyethylene glycol (MIRALAX) packet 17 g  17 g Oral Daily Verlee Rossetti, APRN   17 g at 06/22/20 2841   ? predniSONE (DELTASONE) tablet 100 mg  100 mg Oral Daily Peri Jefferson, [place]   100 mg at 06/22/20 3244   ? rosuvastatin (CRESTOR) tablet 40 mg  40 mg Oral Daily Moreines, Reynolds Bowl, MD   40 mg at 06/22/20 0102   ? senna-docusate (SENNA-PLUS) 8.6-50 mg per tablet 1 tablet  1 tablet Oral BID Tim Lair, MD   1 tablet at 06/22/20 7253   ? sodium chloride 0.9 % flush 3 mL  3 mL IV Push Q8H Moreines, Reynolds Bowl, MD  3 mL at 06/19/20 2053   ? sulfamethoxazole-trimethoprim (BACTRIM DS;CO-TRIMOXAZOLE DS) 800-160 mg per tablet 1 tablet  1 tablet Oral Once per day on Mon Wed Fri Moreines, Reynolds Bowl, MD   1 tablet at 06/22/20 5784   ? tamsulosin (FLOMAX) 24 hr capsule 0.4 mg  0.4 mg Oral Nightly Moreines, Reynolds Bowl, MD   0.4 mg at 06/21/20 2000     Continuous Infusions:  PRN Meds:.acetaminophen, albuterol, carboxymethylcellulose, dextrose 50% in water (D50W), dextrose 50% in water (D50W), Enteral Hypoglycemia Management if Blood Glucose 50 - 69 mg/dL or 70 - 79 mg/dL with symptoms of Hypoglycemia, Enteral Hypoglycemia Management if Blood Glucose less than 50 mg/dL, glucagon, LORazepam, nicotine (polacrilex), oxyCODONE, polyethylene glycol, simethicone, sodium chloride     ROS    12 point review of systems completed and negative other than noted above  ROS     Allergies   No Known Allergies     Objective:       Gross Totals (Last 24 hours) at 06/22/2020 0951  Last data filed at 06/22/2020 0800  Intake 480 ml   Output 1550 ml   Net -1070 ml     Wt:   06/22/20 132.6 kg   06/13/20 (!) 140.6 kg   06/09/20 (!) 141.7 kg   06/02/20 (!) 136.9 kg   05/27/20 (!) 138.3 kg   05/21/20 (!) 139 kg     Weight approximately 132 kg June 2021 per review of epic chart    Physical Exam:   Temp:  [97.7 ?F (36.5 ?C)-98.3 ?F (36.8 ?C)] 98.3 ?F (36.8 ?C)  Pulse:  [46-62] 46  Resp:  [18-20] 20  BP: (90-124)/(55-74) 107/58  SpO2:  [88 %-96 %] 96 %  Device (Oxygen Therapy): nasal cannula  O2 Flow (L/min):  [2-4] 4    Gen: Sitting upright in chair with nasal cannula on face, no acute distress  CV: Regular rate and rhythm, normal S1/S2, no murmurs, rubs or gallops  Pulm: Clear to auscultation bilaterally  Abd: Soft, non-tender/non-distended, +BS  Ext: Bilateral lower extremity edema 1-2+ to upper shins  Neuro: No focal deficits, face symmetric  Skin: No apparent rashes      CBC  Recent Labs   Lab 06/18/20  0607 06/19/20  0556 06/20/20  0533   WBC 21.1* 20.7* 22.2*   HGB 10.3* 10.1* 11.0*   HCT 29.9* 29.7* 31.4*   PLT 249 248 246       LFTs  Recent Labs   Lab 06/16/20  1654 06/16/20  2321 06/17/20  1333   ALT 34 33 33   AST 13 19 21    ALKPHOS 118 128* 125*   BILITOT 0.2 <0.2 0.3   BILIDIR  --  <0.2 <0.2    BMP  Recent Labs   Lab 06/17/20  0654 06/17/20  1333 06/20/20  0533 06/21/20  0633 06/22/20  0449   NA  --    < > 140 140 138   K  --    < > 4.4 3.8 5.3   CL  --    < > 98 99 98   CO2  --    < > 31* 30 27   BUN  --    < > 44* 48* 44*   CREATININE  --    < > 1.69* 1.87* 1.95*   CALCIUM  --    < > 9.0 8.7* 9.2   MG 2.0  --  2.3  --   --  PHOS  --   --  3.6  --   --     < > = values in this interval not displayed.       Coags  Recent Labs   Lab 06/16/20  2321   INR 0.94 Cardiac Enzymes  Recent Labs     06/21/20  0633   TROPONINT 0.08*    pBNP  Lab Results   Component Value Date    BNPPRO 1,369.0 (H) 06/16/2020    BNPPRO 1,235.0 (H) 06/16/2020    BNPPRO 1,845.0 (H) 06/02/2020     No results found for: BNP   Lipids  Lab Results   Component Value Date    CHOL 141 05/11/2020    HDL 39 (L) 05/11/2020    LDL 75 05/11/2020    TRIG 161 05/11/2020    A1C  Lab Results   Component Value Date    HGBA1C 6.9 (H) 06/20/2020        Diagnostics:  MRI heart 07/22/20:  1. Normal left ventricular size. Normal left ventricular systolic function. LVEF: 60 %. Hypokinesis of the basal to mid inferoseptum and the basal to apical inferior wall, with associated thinning of the myocardium and subendocardial delayed enhancement. Findings compatible with ischemic cardiomyopathy. No findings suggestive of acute myocarditis.  2. Normal right ventricular size. Normal right ventricular systolic function. RVEF: 60 %. No right ventricular delayed myocardial enhancement.   3. No significant valvular abnormalities.  4. Mild left atrial enlargement. Normal right atrial size.  5. Innumerable bilateral pulmonary metastases, better characterized on recent CTA chest dated 06/17/2020.  6. Ascending aortic aneurysm measuring 4.1 cm in diameter.    TTE 06/17/20:   * Study limited by poor acoustic windows.  * Suboptimal visualization of the left ventricle, but it appears to be normal size with normal function.  No regional wall motion abnormalities.  LVEF estimated by visual assessment was between 55-60%.  * Suboptimal visualization of the right ventricle, but it appears to be dilated with mildly decreased function.  * There is beat to beat variability in the LV and RV function due to frequent ventricular ectopy.  * IVC diameter < 2.1 cm that collapses > 50% with a sniff suggests normal RAP (0-5 mmHg, mean 3 mmHg).  * No significant pericardial effusion.  * Compared with the prior study, dated 05/29/2020, there are changes noted.  The RV appears dilated with mildly reduced function.    Impressions and Recommendations:   Paul Hunter is a 69 y.o. 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 Echo done in this admission was evident for EF of 55-60%, no regional WMA with dilated RV with mild RV dysfunction. Per cardio-onc MRI heart yesterday +T2 and + troponins - per their recs recommended mycophenolate in addition to steroids 110 mg/day.    Responding well to diuretics with relatively clear lungs although patient still sleeping upright in recliner and using 4 L NC despite being on no home oxygen. Cr increased to 1.95 today from 1.87 yesterday (baseline around 1.4-1.5 per chart review). Weight 132.6 kg similar to June 2021 weight. Would recommend additional diuresis today with 60 IV lasix but would continue to monitor closely and re-evaluate tomorrow before re-dosing.     Recommendations:     #HF exacerbation  - Cont diuresis today with 60 IV  lasix to keep net negative approximately 1L with strict Is/Os, volume assessment and monitoring kidney functions.  - Given elevated Cr, stable weight, improving LE edema would re-evaluate tomorrow before additional diuresis likely approaching euvolemia  - Consider urine lytes/urea to evaluate Cr elevation from baseline   - Continue strict intake and output and daily standing weights  - Continue aspirin 81, statin, lisinppril 2.5 mg daily, metoprolol 12.5 mg daily    #Myocarditis  - Per cardio-onc mycophenolate and steroids  ?  CONTACT:  Primary Emergency Contact: AUSTIN,[name], Home Phone: [phone]  ?  Recommendations are preliminary and may change pending attending attestation.  Please use MHB/call with any questions.    Luciano Cutter  Internal Medicine PGY1  [facility]  Best Contact: M[facility]    I personally interviewed and examined the patient, reviewed clinical data, including laboratory data, current medications, vital signs/hemodynamic data and imaging data, and directly supervised and participated in the development of the specific assessment and treatment plans documented in the note above. I independently verified and agree with the documented physical exam. I directly participated in and supervised development of the assessment and treatment plan. Exceptions/additional comments noted in above bullets. If you have any questions, please do not hesitate to contact me.       Clinically improving with less sob, but remains on oxygen. Weight has been stable the last 2 days.  He is down 6 kgs from early this month when he was d/ced from hospital. Cr slightly higher. Appreciate input from cardio-oncology and mycophenolate to be started. The official cMRI shows no evidence of active myocarditis with subendocardial scar.    Plan is for one more day of IV lasix and then change to po  Attempt to wean off of oxygen    Charleston Ropes, MD [facility]  Clinician [facility]  [phone]

## 2020-06-22 NOTE — Significant Event
Called to bedside for patient fall. Patient found lying flat on floor outside bathroom, awake and conversant. Reports that he was on the toilet, had a bowel movement and stood up too quickly and became lightheaded. He lowered himself to the ground, did not lose consciousness but did have a mild head strike over R parietal region. No tenderness to palpation over neck, no step off noted. PERRL, EOMICN intactStrength 4+/5 in UE/LE bilaterally Plan: - Head [place] - No indication for [place] or [place] cspine, no indication for trauma team evaluation since patient alert, conversant and reliable. - check orthostatic vitals once [place] resulted Roger Kill, MDHospitalist Attending

## 2020-06-22 NOTE — Progress Notes
[facility] Progress NoteAttending Provider: Roger Kill, MD [phone]                                            Subjective: Interim History: Patient reports feeling good. Denies feeling SOB despite now being on 4L O2. No CP. Reports his usual abdominal pain. Relieved with oxycodone.Patient's O2 sat checked on 4L. Pulse oximeter pleth with poor waveform. Tried different pulse ox with better waveform and sat 90% on 1L NC. RN tried pulse ox on patient's ear with sats 88-90% on room air.Review of Allergies/Meds/Hx: Review of Allergies/Meds/Hx:I have reviewed the patient's: current scheduled medications, current infusions, current prn medications and past medical historyObjective: Vitals:I have reviewed the patient's current vital signs as documented in the patient's EMR.   and Last 24 hours:Temp:  [97.7 ?F (36.5 ?C)-98.3 ?F (36.8 ?C)] 98.3 ?F (36.8 ?C)Pulse:  [46-62] 46Resp:  [18-20] 20BP: (90-124)/(56-74) 107/58SpO2:  [92 %-96 %] 96 %I/O's:I have reviewed the patient's current I&O's as documented in the EMR.Gross Totals (Last 24 hours) at 06/22/2020 1129Last data filed at 06/22/2020 1000Intake 550 ml Output 1250 ml Net -700 ml Procedures:None Physical Exam:Physical ExamVitals reviewed. Constitutional:     Comments: Pleasant male sitting up in the chair in NAD HENT:    Head: Normocephalic. Cardiovascular:    Rate and Rhythm: Normal rate and regular rhythm. Pulmonary:    Effort: Pulmonary effort is normal.    Breath sounds: Normal breath sounds. No rales. Abdominal:    General: Bowel sounds are normal.    Palpations: Abdomen is soft. Musculoskeletal:    Right lower leg: Edema present.    Left lower leg: Edema present.    Comments: 2+ pitting edema BLE Neurological:    Mental Status: He is oriented to person, place, and time. Labs:I have reviewed the patient's labs within the last 24 hrs.Na 138K 5.3Cr 1.95Glucose 101Diagnostics:I have reviewed the patient's Radiology report(s) within the last 48 hrs. Significant findings are   .MRI Heart w wo IV Contrast w Velocity Flow [[id number]] Collected: 06/21/20 1336 1. Normal left ventricular size. Normal left ventricular systolic function. LVEF: 60 %. Hypokinesis of the basal to mid inferoseptum and the basal to apical inferior wall, with associated thinning of the myocardium and subendocardial delayed enhancement. Findings compatible with ischemic cardiomyopathy. No findings suggestive of acute myocarditis. 2. Normal right ventricular size. Normal right ventricular systolic function. RVEF: 60 %. No right ventricular delayed myocardial enhancement. 3. No significant valvular abnormalities. 4. Mild left atrial enlargement. Normal right atrial size. 5. Innumerable bilateral pulmonary metastases, better characterized on recent CTA chest dated 06/17/2020. 6. Ascending aortic aneurysm measuring 4.1 cm in diameter. 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 Plan: HF exacerbation- Appreciate cardiology consult- Holding IV Lasix for today given Cr trending up and O2 requirements down - have asked RN to ensure good pulse ox pleth prior to documenting O2 sat- Lisinopril on hold given Cr trending up- Holding metoprolol given low/normal BP and episode of bradycardia this AM- Midodrine 2.5 mg TID added yesterday given low/normal BP and need to diurese - Continue ASA and statin- Monitor I/Os and daily weights- Work to wean off O2 - patient on RA at baselineMyocarditis- cMRI done yesterday-  Appreciate cardio-onc note- Urine electrolytes ordered- Will order daily trop and EKG- Continue prednisone taper - decrease by 10 mg weekly on Wednesday. Decreased to 100 mg daily today- Continue Bactrim ppx- Started on Cellcept 1000 mg BID- Follow-up with cardio-onc 10/29 - MyChart video visit so can be done inpatient if neededLeukocytosis- iso high dose steroids Hyperglycemia- iso steroid use- Continue ISSAbdominal pain- iso bladder ca - Continue Oxycontin and PRN oxycodoneBladder ca- Pain control as above- Continue Flomax 0.4 mg QHS- Continue oxybutynin 5 mg dailyOSA- Continue CPAP Code Status: Full code/ACLSMed Rec: completeDiet: Cardiac DMPPx: SQ LovenoxDispo: pending improvement in volume status  Electronically Signed:Tion Tse, PA-CContact via MHB10/27/2021 11:29 AM

## 2020-06-22 NOTE — Plan of Care
Plan of Care Overview/ Patient Status    S/P Patient FallDate of Fall: 10/27/21Time of Fall: 1412Post Fall Vital Signs Pulse - Post Fall: 88Resp. Rate Post Fall: 18 PER MINUTEBP - Post Fall: 152/71Bed Position at TIme of Fall: Low Position;Brake On;Bedrail Up - Two Nurse Call System Location: Attached to chair armAt the time of fall,was the patient: Bed/chair alarm in useActivity During Fall: While walking to bathroomPatient Position Upon Discovery: supineFall Prevention Education Provided: YesSee Patient Care Summary for assessmentComment: Pt was ambulating from the chair to the bathroom. Pts chair alarm was on, hospital socks on pts feet, call bell within reach. This RN heard chair alarm go off, Fish farm manager calling out in hallway that pt is ambulating to bathroom, this RN ran to pts room, this RN found pt laying supine in the bathroom, assessment questions asked, Pt A&O x4, vitals taken WDL, provider Haq called to bedside, RRT called. Provider Haq, this RN and SWAT RN guided pt back to bed. Neuro assessment obtained. Tasley ordered by provider. Debriefing of fall occurred between this RN and Adrienne PCA. Pt was educated on the importance of calling for the bathroom prior to ambulating. Bed alarm on. Family at bedside, education about fall prevention provided to family. Problem: Adult Inpatient Plan of CareGoal: Plan of Care ReviewOutcome: Interventions implemented as appropriateGoal: Patient-Specific Goal (Individualized)Outcome: Interventions implemented as appropriateGoal: Absence of Hospital-Acquired Illness or InjuryOutcome: Interventions implemented as appropriateGoal: Optimal Comfort and WellbeingOutcome: Interventions implemented as appropriateGoal: Readiness for Transition of CareOutcome: Interventions implemented as appropriate Problem: InfectionGoal: Absence of Infection Signs and SymptomsOutcome: Interventions implemented as appropriate Problem: Restraint, Nonbehavioral (Nonviolent)Goal: Discontinuation Criteria AchievedOutcome: Interventions implemented as appropriate Problem: Skin Injury Risk IncreasedGoal: Skin Health and IntegrityOutcome: Interventions implemented as appropriate Problem: Fall Injury RiskGoal: Absence of Fall and Fall-Related InjuryOutcome: Interventions implemented as appropriate Problem: Impaired Wound HealingGoal: Optimal Wound HealingOutcome: Interventions implemented as appropriate

## 2020-06-22 NOTE — Plan of Care
Plan of Care Overview/ Patient Status    1900-0700 Alert and oriented x4. VSS on 4L NC. CPAP on overnight while sleeping.  Abdominal pain well controlled with scheduled OxyContin and PRN oxycodone. NSR with intermittent bigeminy on telemetry. OOB with SBA and RW. Slept in chair overnight. Indepedent with bed/chair mobility. Safety maintained. Hourly rounding completed. See flowsheets for assessments. Problem: Adult Inpatient Plan of CareGoal: Plan of Care ReviewOutcome: Interventions implemented as appropriateGoal: Patient-Specific Goal (Individualized)Outcome: Interventions implemented as appropriateGoal: Absence of Hospital-Acquired Illness or InjuryOutcome: Interventions implemented as appropriateGoal: Optimal Comfort and WellbeingOutcome: Interventions implemented as appropriateGoal: Readiness for Transition of CareOutcome: Interventions implemented as appropriate Problem: InfectionGoal: Absence of Infection Signs and SymptomsOutcome: Interventions implemented as appropriate Problem: Restraint, Nonbehavioral (Nonviolent)Goal: Discontinuation Criteria AchievedOutcome: Interventions implemented as appropriate Problem: Skin Injury Risk IncreasedGoal: Skin Health and IntegrityOutcome: Interventions implemented as appropriate Problem: Fall Injury RiskGoal: Absence of Fall and Fall-Related InjuryOutcome: Interventions implemented as appropriate Problem: Impaired Wound HealingGoal: Optimal Wound HealingOutcome: Interventions implemented as appropriate

## 2020-06-23 ENCOUNTER — Ambulatory Visit: Admit: 2020-06-23 | Payer: PRIVATE HEALTH INSURANCE

## 2020-06-23 ENCOUNTER — Ambulatory Visit: Admit: 2020-06-23 | Payer: PRIVATE HEALTH INSURANCE | Attending: Internal Medicine

## 2020-06-23 ENCOUNTER — Ambulatory Visit: Admit: 2020-06-23 | Payer: PRIVATE HEALTH INSURANCE | Attending: Medical Oncology

## 2020-06-23 ENCOUNTER — Encounter: Admit: 2020-06-23 | Payer: PRIVATE HEALTH INSURANCE

## 2020-06-23 DIAGNOSIS — K5900 Constipation, unspecified: Secondary | ICD-10-CM

## 2020-06-23 DIAGNOSIS — N179 Acute kidney failure, unspecified: Secondary | ICD-10-CM

## 2020-06-23 DIAGNOSIS — Z7902 Long term (current) use of antithrombotics/antiplatelets: Secondary | ICD-10-CM

## 2020-06-23 DIAGNOSIS — J439 Emphysema, unspecified: Secondary | ICD-10-CM

## 2020-06-23 DIAGNOSIS — I130 Hypertensive heart and chronic kidney disease with heart failure and stage 1 through stage 4 chronic kidney disease, or unspecified chronic kidney disease: Secondary | ICD-10-CM

## 2020-06-23 DIAGNOSIS — E669 Obesity, unspecified: Secondary | ICD-10-CM

## 2020-06-23 DIAGNOSIS — N400 Enlarged prostate without lower urinary tract symptoms: Secondary | ICD-10-CM

## 2020-06-23 DIAGNOSIS — Z7982 Long term (current) use of aspirin: Secondary | ICD-10-CM

## 2020-06-23 DIAGNOSIS — G4733 Obstructive sleep apnea (adult) (pediatric): Secondary | ICD-10-CM

## 2020-06-23 DIAGNOSIS — Z6841 Body Mass Index (BMI) 40.0 and over, adult: Secondary | ICD-10-CM

## 2020-06-23 DIAGNOSIS — F32A Depression, unspecified: Secondary | ICD-10-CM

## 2020-06-23 DIAGNOSIS — I2510 Atherosclerotic heart disease of native coronary artery without angina pectoris: Secondary | ICD-10-CM

## 2020-06-23 DIAGNOSIS — E785 Hyperlipidemia, unspecified: Secondary | ICD-10-CM

## 2020-06-23 DIAGNOSIS — I5033 Acute on chronic diastolic (congestive) heart failure: Secondary | ICD-10-CM

## 2020-06-23 DIAGNOSIS — E1122 Type 2 diabetes mellitus with diabetic chronic kidney disease: Secondary | ICD-10-CM

## 2020-06-23 DIAGNOSIS — N183 Chronic kidney disease, stage 3 unspecified: Secondary | ICD-10-CM

## 2020-06-23 DIAGNOSIS — C7800 Secondary malignant neoplasm of unspecified lung: Secondary | ICD-10-CM

## 2020-06-23 DIAGNOSIS — Z20822 Contact with and (suspected) exposure to covid-19: Secondary | ICD-10-CM

## 2020-06-23 DIAGNOSIS — I493 Ventricular premature depolarization: Secondary | ICD-10-CM

## 2020-06-23 DIAGNOSIS — I429 Cardiomyopathy, unspecified: Secondary | ICD-10-CM

## 2020-06-23 DIAGNOSIS — Z79899 Other long term (current) drug therapy: Secondary | ICD-10-CM

## 2020-06-23 DIAGNOSIS — E1165 Type 2 diabetes mellitus with hyperglycemia: Secondary | ICD-10-CM

## 2020-06-23 DIAGNOSIS — C679 Malignant neoplasm of bladder, unspecified: Secondary | ICD-10-CM

## 2020-06-23 DIAGNOSIS — I252 Old myocardial infarction: Secondary | ICD-10-CM

## 2020-06-23 DIAGNOSIS — I514 Myocarditis, unspecified: Secondary | ICD-10-CM

## 2020-06-23 DIAGNOSIS — K219 Gastro-esophageal reflux disease without esophagitis: Secondary | ICD-10-CM

## 2020-06-23 DIAGNOSIS — Z955 Presence of coronary angioplasty implant and graft: Secondary | ICD-10-CM

## 2020-06-23 DIAGNOSIS — F17210 Nicotine dependence, cigarettes, uncomplicated: Secondary | ICD-10-CM

## 2020-06-23 DIAGNOSIS — T380X5A Adverse effect of glucocorticoids and synthetic analogues, initial encounter: Secondary | ICD-10-CM

## 2020-06-23 LAB — BASIC METABOLIC PANEL
BKR ANION GAP: 11 % — ABNORMAL HIGH (ref 7–17)
BKR BLOOD UREA NITROGEN: 38 mg/dL — ABNORMAL HIGH (ref 8–23)
BKR BUN / CREAT RATIO: 22.6 (ref 8.0–23.0)
BKR CALCIUM: 9 mg/dL — ABNORMAL LOW (ref 8.8–10.2)
BKR CHLORIDE: 101 mmol/L (ref 98–107)
BKR CO2: 28 mmol/L — ABNORMAL LOW (ref 20–30)
BKR CREATININE: 1.68 mg/dL — ABNORMAL HIGH (ref 0.40–1.30)
BKR EGFR (AFR AMER): 50 mL/min/{1.73_m2} (ref >60)
BKR EGFR (NON AFRICAN AMERICAN): 41 mL/min/{1.73_m2} (ref >60)
BKR GLUCOSE: 106 mg/dL — ABNORMAL HIGH (ref 70–100)
BKR POTASSIUM: 4.2 mmol/L (ref 3.3–5.3)

## 2020-06-23 LAB — MAGNESIUM: BKR MAGNESIUM: 2.3 mg/dL (ref 1.7–2.4)

## 2020-06-23 LAB — TROPONIN T     (Q): BKR TROPONIN T: 0.07 ng/mL — ABNORMAL HIGH (ref <0.01)

## 2020-06-23 MED ORDER — LISINOPRIL 2.5 MG TABLET
2.5 mg | ORAL_TABLET | Freq: Every day | ORAL | 12 refills | Status: AC
Start: 2020-06-23 — End: ?

## 2020-06-23 MED ORDER — TORSEMIDE 20 MG TABLET
20 mg | ORAL_TABLET | Freq: Every day | ORAL | 1 refills | Status: AC
Start: 2020-06-23 — End: ?

## 2020-06-23 MED ORDER — METOPROLOL SUCCINATE ER 25 MG TABLET,EXTENDED RELEASE 24 HR
25 mg | ORAL_TABLET | Freq: Every day | ORAL | 1 refills | Status: AC
Start: 2020-06-23 — End: ?

## 2020-06-23 MED ORDER — MYCOPHENOLATE MOFETIL 500 MG TABLET
500 mg | ORAL_TABLET | Freq: Two times a day (BID) | ORAL | 1 refills | Status: AC
Start: 2020-06-23 — End: ?

## 2020-06-23 NOTE — Plan of Care
Plan of Care Overview/ Patient Status    1900-0700Pt A&O x 4, VSS on 1 L NC. Refused BiPAP overnight, provider aware. Tele monitoring continued, NSR, HR in the 60's.  C/o pain in abdomen, PRN oxycodone given to positive effect. Denies SOB, chest pain, n/v/d, numbness/tingling. Continent of B&B, assist  X 1 with RW up to toilet, no BM this shift. Takes pills whole with water. R PIV patent. T&R independently in bed. POCT continued. Bed in low and locked position with alarm on, call bell with reach, hourly rounding . Safety maintained. Problem: Adult Inpatient Plan of CareGoal: Plan of Care ReviewOutcome: Interventions implemented as appropriateGoal: Patient-Specific Goal (Individualized)Outcome: Interventions implemented as appropriateGoal: Absence of Hospital-Acquired Illness or InjuryOutcome: Interventions implemented as appropriateGoal: Optimal Comfort and WellbeingOutcome: Interventions implemented as appropriateGoal: Readiness for Transition of CareOutcome: Interventions implemented as appropriate

## 2020-06-23 NOTE — Discharge Instructions
Medication changes:- Lasix discontinued and started on a different diuretic called Torsemide (per cardiology)- Lisinopril on hold until further discussion with cardiology- Toprol XL on hold because your heart rate was slow (bradycardia); can be reassessed by cardiologist outpatient- New medication: cellcept recommended by cardiologyIt was a pleasure taking care of you during your hospitalization. If you have any questions or concerns regarding your hospital stay, please feel free to contact the [facility] at [phone].

## 2020-06-23 NOTE — Plan of Care
Cardio-oncology progress note    Vitals:    06/21/20 1015 06/21/20 1551 06/21/20 2030 06/21/20 2137   BP:  124/74 (!) 90/56 106/63   Pulse:  60 62    Resp:  20 18    Temp:  98.2 ?F (36.8 ?C) 97.7 ?F (36.5 ?C)    TempSrc:  Oral Oral    SpO2: (!) 93% 96% (!) 92% (!) 93%   Weight:       Height:         BMI Readings from Last 3 Encounters:   06/20/20 39.64 kg/m?   06/13/20 42.04 kg/m?   06/09/20 42.37 kg/m?       MRI 06/21/20 on my review, T2, fat sat still looks +, suggestive of myocardial edema (although there was some artifact), LVEF has recovered compared to prior cMRI 04/2020, DGE pattern is similar in an ischemic distribution in RCA and LAD territories              Given +troponins, and T2+, will add myocophenolate  is pt really net neg 9L?      Plan  Get urine lytes/urine urea to evaluate rising creatinine, make sure pt is not intravascularly depleted: FeUrea >35%, suggests intrinsic renal disease  Trend serial troponin and 12 lead ecg daily  Strict Is and Os  Daily weights  Continue diuresis per primary team/general cardiology recs  Continue his current steroid dose of 110mg /day, taper 10mg  per week as long as troponin downtrends  Continue asa 81, atorva 80/or equivalent  Consider restarting once renal function/BP improves: lisinopril 2.5mg   Continue metop succ 12.5 if HR >60  start 1000mg  BID PO myocophenolate* in addition to current steroid dose and will taper prednisone 10mg /wk while monitoring his troponins and imaging  Repeat cMRIin 4-6 weeks  Follow up with cardio oncology as outpt, has f/u 10/29    ?  *Mycophenolate mofetil: Oral 1 g twice daily in combination with glucocorticoids (ASCO [Brahmer 2018]; One[place] Alar 2019).  [email or web address]  [email or web address]  ?  Milta Deiters MD PhD  Clinical Cardio-oncology fellow

## 2020-06-23 NOTE — Plan of Care
Physical Therapy Evaluation    Default Flowsheet Data (most recent)       IP PT Discharge Evaluation - 06/23/20 1430          Date of Visit / Treatment    Date of Visit / Treatment 06/23/20     Note Type Evaluation     End Time 1430        Patient Overview    History of Present Illness per chart, 69 y/o male with PMHx of HTN, T2DM, CAD (s/p MI, PCI x4), COPD with Metastatic Urothelial Carcinoma (originally thought to have Stage IV NSCLC, after p/w DOE in 06/2019, PET 11/04/19 w/ hyperavid lung nodules w/ diffuse adenopathy in neck/chest and upper abdomen, s/p infraclavicular LN bx 11/18/19 w/ focal atypical cells c/f carcinoma, s/p EBUS w/ bx of station 7 LN c/w metastatic SCC; s/p Carbo/Taxol/Pembro x4 cycles (11/2019-02/2020 c/c/b neuropathy requiring dose reduction and 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 (on steroid taper)), with recent admission 04/2020 for hematuria, s/p Cystoscopy with clot evacuation, TURBT, L retrograde pyelogram, and noted 3x4cm papillary tumor involving L trigone and L lateral bladder wall on 9/26 with path c/w Urothelial carcinoma, s/p repeat EBUS 9/28 with RLL FNA/Wash positive for malignant cells (similar morphologically to bladder pathology, therefore felt to be metastatic carcinoma from known urothelial primary), plan to start Enfortumab Vedotin) who presents with shortness of breath, CXR with cardiomegaly and central vascular congestion s/f fluid overload; CTA negative for PE but with interval enlargement of multiple lung mets, cardiology/cardio-onc consulted, currently on Pred and Cellcept     Precautions fall     Precautions - Additional Details/Comments chair alarm, bed alarm     Social History - Additional Details/Comments pt lives in in law apartment of niece's house. +1 FOS to enter. Pt reports niece is able to provide A as needed in addition to currently receiving Home PT/OT, visiting nursing/ HHA     Prior Level of Function independent with assistive device;assist with ADLs     Prior Level of Function - Additional Details/Comments ambulating with RW/rollator PTA     Subjective I'm actually excited     General Observations pt received in chair on RA in NAD. PT session cleared by RN        Assessment    Cognition (Mentation/Communication) within functional limits     Vital Signs vital signs stable   HR: 99 during ambulation, 98% O2 after ambualtion: 86 HR 96% O2    Pain Rating 0     Skin Integrity/Edema see skin documentation     Sensation no complaints     Range of Motion within functional limits     Muscle Strength/Tone at least 3/5 as demonstrated with mobility/function     Balance no loss of balance        Functional Mobility    Supine to/from Sit --   received in chair    Sit to/from Stand Supervision     Sit to/from Stand Device Rolling walker     Ambulation Supervision     Ambulation Device Rolling walker     Ambulation Distance 100 feet;x2   1 seated rest break, steady with no LOB, good cadence/ navigation with RW    Overall Functional Mobility Comments pt declined practicing stairs 2/2 fatigue. Pt reporting first floor set up available. Pt demonstrating adequate dynamic balance via standing marching. Pt education regarding importance of having someone with him on the stairs at all times provided as well as  pacing activity for safety during mobilization provided.        PT- AM-PAC - Basic Mobility Screen- How much help from another person do you currently need.....    Turning from your back to your side while in a a flat bed without using rails? 4 - None - Does not require any help and does the activity independently. Can use assistive devices.     Moving from lying on your back to sitting on the side of a flat bed without using bed rails? 4 - None - Does not require any help and does the activity independently. Can use assistive devices.     Moving to and from a bed to a chair (including a wheelchair)? 3 - A Little - Requires a little help (supervision, minimal assistance). Can use assistive devices.     Standing up from a chair using your arms(e.g., wheelchair or bedside chair)? 3 - A Little - Requires a little help (supervision, minimal assistance). Can use assistive devices.     To walk in a hospital room? 3 - A Little - Requires a little help (supervision, minimal assistance). Can use assistive devices.     Climbing 3-5 steps with a railing? 3 - A Little - Requires a little help (supervision, minimal assistance). Can use assistive devices.     AMPAC Mobility Score 20     TARGET Highest Level of Mobility Mobility Level 6, Walk 10+steps        PT Recommendations for Inpatient Admission    Activity/Level of Assist ambulate;assist of 1;with rolling walker        Clinical Impression / Recommendation    Initial Assessment 06/23/20: PT consulted fo rmobility assessment. Pt presents with deficits in strength, balance, and overall activity tolerance although reports improved ability to navigate. Pt reports mobilizing close to baseline and was able to ambulate with supervision and RW without difficulty. Pt will continue to benefit form skilled PT to address acute deficits and return to PLOF. PT rec for home with 24 hr assist, family support, home PT, visiting nurse, and HHA     Patient Goal return home     PT Frequency Cleared   10/28/221    Reason for Discharge (PT) no further needs identified     Physical Therapy Disposition Recommendation Home     Additional Physical Therapy Disposition Recommendations Home with 24 hour assistance;Home Physical Therapy;Home with family support;Outpatient Cardiac Rehab        Handoff Documentation    Handoff Patient in chair;Chair alarm;Patient instructed to call nursing for mobility;Discussed with nursing                   Ledell Peoples, PT  MH: [phone]

## 2020-06-23 NOTE — Progress Notes
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]  ======================================================================    Interval Events:  Fall yesterday afternoon after having bowl movement and standing up too quickly  Sent for [place] head with NAICA abnormality  No acute events overnight  Not given diuresis yesterday     Objective:    Vitals:  Afebrile  Pulse 67  RR 19  BP 135/80  Satting 94% on 1L NC    Intake/Output:  Do not appear to be accurately recorded yesterday     Physical Exam:  Sitting upright, appears well, no acute distress  Moist mucous membranes, no conjunctival icterus or injection  Plethoric neck without significant JVP elevation  Lungs clear to auscultation bilaterally without significant crackles  Heart regular rate and rhythm without significant murmurs rubs or gallops  Extremities are warm and well perfused with mild edema  Patient is alert and oriented, able to give clear history and answer questions    Notable Labs:  Creatinine improving to 1.77  Potassium 5.1 but hemolyzed    Diagnostics:  CMRI 06/21/2020:  1. Normal left ventricular size. Normal left ventricular systolic function. LVEF: 60 %. Hypokinesis of the basal to mid inferoseptum and the basal to apical inferior wall, with associated thinning of the myocardium and subendocardial delayed enhancement. Findings compatible with ischemic cardiomyopathy. No findings suggestive of acute myocarditis.  2. Normal right ventricular size. Normal right ventricular systolic function. RVEF: 60 %. No right ventricular delayed myocardial enhancement.   3. No significant valvular abnormalities.  4. Mild left atrial enlargement. Normal right atrial size.  5. Innumerable bilateral pulmonary metastases, better characterized on recent CTA chest dated 06/17/2020.  6. Ascending aortic aneurysm measuring 4.1 cm in diameter.    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 Echo done in this admission was evident for EF of 55-60%, no regional WMA with dilated RV with mild RV dysfunction. Per cardio-onc MRI heart yesterday +T2 and + troponins - per their recs recommended mycophenolate in addition to steroids 110 mg/day.    He has now clinically improved, and if he is able to come off of oxygen this morning he may be able to be transitioned to oral diuretics. Would recommend 20mg  of torsemide daily. Will follow up with cardio-oncology as an outpatient, has appointment scheduled for tomorrow.     Recommendations:  1. Almost off oxygen, if comes off oxygen this AM can be discharged from cardiology perspective  2. Defer to cardio-oncology regarding management of immunosuppressants   3. Would change home diuretics to torsemide 20mg  daily   4. Continue aspirin and statin for secondary prevention of CAD  5. Patient should weigh himself at home daily, if he gains more than 2 lbs over 2 days or more than 5 lbs over 5 days he should call his cardiology office to determine if a dose adjustment of his diuretics are necessary. Same if he loses weight or begins to feel symptoms of orthostasis.   6. Cardiology will now sign off, please do  not hesitate to call us with any questions or concerns.     Thank you so much for involving [place] in the care of this patient. Please do not hesitate to call with any questions or concerns. Easiest way to contact is via [facility]. These recommendations are preliminary and may change pending review with the attending and final attending attestation.     Paul D. Anola Gurney, MD  Fellow in Cardiovascular Diseases   [facility]  Best Contact: [facility]  06/23/20     I personally interviewed and examined the patient, reviewed clinical data, including laboratory data, current medications, vital signs/hemodynamic data and imaging data, and directly supervised and participated in the development of the specific assessment and treatment plans documented in the note above. I independently verified and agree with the documented physical exam. I directly participated in and supervised development of the assessment and treatment plan. Exceptions/additional comments noted in above bullets. If you have any questions, please do not hesitate to contact me.       As above. Appears to be at baseline weight. Oxygen being weaned off.  Plan as above. Reviewed with pt need to sleep with CPAP nightly    Paul Ropes, MD [facility]  Clinician [facility]

## 2020-06-23 NOTE — Plan of Care
Plan of Care Overview/ Patient Status    CM Discharge Planning    Most Recent Value Discharge Planning Concerns to be Addressed care coordination/care conferences, basic needs Patient/Patient Representative was presented with a list of choices of facilities, agencies and/or dme providers Homecare Company Preference(s) Homecare Preference(s) [facility] Referral(s) placed for: Homecare services Home Health Care Services Required Nursing, Physical Therapy, Occupational Therapy, Home Health Aide Homecare Services have been secured as of 06/23/20 Homecare Company [facility] Patient is considered homebound due to: (he/she requires considerable and taxing effort to leave their residence for medical reasons or religious services OR infrequently OR of short duration for other reasons) mobility problems Equipment Needed After Discharge none Mode of Transportation  Private car  (add comment for special considerations) Patient to be accompanied by family member Is the patient discharging to their home address Yes CM D/C Readiness PASRR completed and approved N/A Authorization number obtained, if required N/A Is there a 3 day INPATIENT Qualifying stay for Medicare Patients? N/A DME Authorized/Delivered N/A No needs identified/ follow up with PCP/MD Yes Post acute care services secured W10 complete Yes Pri Completed and Accepted  N/A Finalized Plan Expected Discharge Date 06/23/20 Discharge Disposition Home-Health Care Svc Care Coordination recommendation for Provider under the W10 MD certification statement Home Health Agency Post acute care services secured W10 complete Yes Physician documentation required AVS (Patient Instructions), Prescriptions Discharge Coordination/Progress Patient is medically cleared for discharge home w/ resumption of services provided by [facility]. Patient is in agreemnt with discharge POC. RN and BA aware. BA to fax W10. Patient has arranged for ride home.  Roxan Diesel. Tomasa Rand, RN, [facility]: [phone]: [phone]

## 2020-06-23 NOTE — Plan of Care
Plan of Care Overview/ Patient Status    Paul Hunter was discharged via Private Car accompanied by Alone.  Verbalized understanding of discharge instructionsand recommended follow up care as per the after visit summary.  Written discharge instructions provided. Denies any further questions. Vital signs    Vitals:  06/22/20 1647 06/22/20 2020 06/23/20 0740 06/23/20 0800 BP: 115/67 135/80 123/76  Pulse: 80 67 (!) 56  Resp: 20 19 16   Temp: 97.7 ?F (36.5 ?C) 97.6 ?F (36.4 ?C) 97.8 ?F (36.6 ?C)  TempSrc: Oral Oral Oral  SpO2: 95% 94% 99%  Weight:   134.6 kg 133.6 kg Height:     0700-1600 A&O x4. VSS on RA, titrated down from 1L NC. Pt complained of abdominal pain, PRN oxycodone given with therapeutic effect. Cardiac CC diet, pills whole, insulin coverage given, good PO intake. Lung sounds are diminished, no SOB reported. Heart rate and rhythm WDL, tele monitoring. Pt is continent of B&B, LBM 10/27 during shift, urinal at bedside. SBA OOB with RW. See flowsheets for skin, slight edema in bilateral lower extremities. Chair/ bed alarm on. Hourly rounding. Problem: Adult Inpatient Plan of CareGoal: Plan of Care ReviewOutcome: Interventions implemented as appropriateGoal: Patient-Specific Goal (Individualized)Outcome: Interventions implemented as appropriateGoal: Absence of Hospital-Acquired Illness or InjuryOutcome: Interventions implemented as appropriateGoal: Optimal Comfort and WellbeingOutcome: Interventions implemented as appropriateGoal: Readiness for Transition of CareOutcome: Interventions implemented as appropriate Problem: InfectionGoal: Absence of Infection Signs and SymptomsOutcome: Interventions implemented as appropriate Problem: Restraint, Nonbehavioral (Nonviolent)Goal: Discontinuation Criteria AchievedOutcome: Interventions implemented as appropriate Problem: Skin Injury Risk IncreasedGoal: Skin Health and IntegrityOutcome: Interventions implemented as appropriate Problem: Fall Injury RiskGoal: Absence of Fall and Fall-Related InjuryOutcome: Interventions implemented as appropriate Problem: Impaired Wound HealingGoal: Optimal Wound HealingOutcome: Interventions implemented as appropriate

## 2020-06-23 NOTE — Consults
Medical Oncology Initial Consult Note  Patient Data:    Patient Name: Paul Hunter    Age: 69 y.o.   DOB: 09/04/1950    MRN: [id number]      Consult Information:     Consultation requested by: Roger Kill, MD  Reason for consultation: REadmitted. NOted your plans to start next line of t/m at 2w mark which is 10/28. Also patient increasingly hopeless, what is his prognosis and would you like pall care involved?  Source of Information: Patient and EMR/Previous Record    Present History     Patient is a 69 y/o male with PMHx of HTN, T2DM, CAD (s/p MI, PCI x4), COPD with Metastatic Urothelial Carcinoma (originally thought to have Stage IV NSCLC, after p/w DOE in 06/2019, PET 11/04/19 w/ hyperavid lung nodules w/ diffuse adenopathy in neck/chest and upper abdomen, s/p infraclavicular LN bx 11/18/19 w/ focal atypical cells c/f carcinoma, s/p EBUS w/ bx of station 7 LN c/w metastatic SCC; s/p Carbo/Taxol/Pembro x4 cycles (11/2019-02/2020 c/c/b neuropathy requiring dose reduction and 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 (on steroid taper)), with recent admission 04/2020 for hematuria, s/p Cystoscopy with clot evacuation, TURBT, L retrograde pyelogram, and noted 3x4cm papillary tumor involving L trigone and L lateral bladder wall on 9/26 with path c/w Urothelial carcinoma, s/p repeat EBUS 9/28 with RLL FNA/Wash positive for malignant cells (similar morphologically to bladder pathology, therefore felt to be metastatic carcinoma from known urothelial primary), plan to start Enfortumab Vedotin) who presents with shortness of breath, CXR with cardiomegaly and central vascular congestion s/f fluid overload; CTA negative for PE but with interval enlargement of multiple lung mets, cardiology/cardio-onc consulted, currently on Lasix 60mg  IV QD, Pred 110mg  QD, awaiting cMRI, remains on 4L NC. Oncology now consulted given due for chemotherapy initiation 10/28.    Chart reviewed. Patient seen and evaluated with attending. Upon exam, patient appears in no acute distress. Cardiology at bedside initially, had patient stand to listen to lung sounds and patient looked unsteady (did not fall). He denies any headache, numbness/tingling, shortness of breath, chest pain, abdominal pain, N/V/D, bleeding, difficulty swallowing, leg swelling or saddle anesthesia.        Review of Systems  Review of Systems   HENT: Negative for sore throat.    Respiratory: Negative for shortness of breath.    Cardiovascular: Negative for chest pain and leg swelling.   Gastrointestinal: Negative for abdominal pain, diarrhea, nausea and vomiting.   Genitourinary:        Denies difficulty urinating   Neurological: Negative for sensory change and headaches.     Oncology History     Oncologist: Dr Hurwitz/Goldberg  Current Treatment Regimen: plan to start Enfortumab Vedotin    Review of History / Allergies / Medications     Allergies notable for:   No Known Allergies    Medications:   Current Facility-Administered Medications   Medication Dose Route Frequency Provider Last Rate Last Admin    albuterol neb sol 2.5 mg/3 mL (0.083%) (PROVENTIL,VENTOLIN)  2.5 mg Nebulization Q6H WA Barrett, Shean L, MD   2.5 mg at 06/21/20 0954    aspirin EC delayed release tablet 81 mg  81 mg Oral Daily Tim Lair, MD   81 mg at 06/21/20 7829    calcium-vitamin D 500 mg-200 units tablet  1 tablet Oral Daily Tim Lair, MD   1 tablet at 06/21/20 0839    enoxaparin (LOVENOX) syringe 40 mg  40 mg Subcutaneous  Q12H Burnett Corrente, PA   40 mg at 06/21/20 1610    escitalopram oxalate (LEXAPRO) tablet 10 mg  10 mg Oral Nightly Tim Lair, MD   10 mg at 06/20/20 2103    furosemide (LASIX) injection 60 mg  60 mg Intravenous Q24H Peri Jefferson, PA   60 mg at 06/21/20 1014    gabapentin (NEURONTIN) capsule 600 mg  600 mg Oral Nightly Tim Lair, MD   600 mg at 06/20/20 2103    insulin lispro (Admelog, HumaLOG) Sliding Scale (See admin instructions for dose)   Subcutaneous TID AC Moreines, Reynolds Bowl, MD   2 Units at 06/20/20 1829    insulin lispro (Admelog, HumaLOG) Sliding Scale (See admin instructions for dose)   Subcutaneous Nightly Ramos, Vida Roller, MD        ipratropium-albuteroL (DUO-NEB) 0.5 mg-3 mg(2.5 mg base)/3 mL nebulizer solution 3 mL  3 mL Nebulization Q6H WA Roger Kill, MD        lisinopriL (PRINIVIL,ZESTRIL) tablet 2.5 mg  2.5 mg Oral Daily Moreines, Reynolds Bowl, MD   2.5 mg at 06/21/20 9604    metoprolol succinate (TOPROL-XL) 24 hr tablet 12.5 mg  12.5 mg Oral Daily Tim Lair, MD   12.5 mg at 06/21/20 5409    nicotine (NICODERM CQ) transdermal patch 24 hr 21 mg  21 mg Transdermal Daily Tim Lair, MD   21 mg at 06/21/20 8119    oxybutynin XL (DITROPAN-XL) 24 hr tablet 5 mg  5 mg Oral Daily Tim Lair, MD   5 mg at 06/21/20 1478    oxyCODONE (OxyCONTIN) 12 hr tablet 10 mg  10 mg Oral Q12H Moreines, Reynolds Bowl, MD   10 mg at 06/21/20 2956    pantoprazole (PROTONIX) EC tablet 40 mg  40 mg Oral Daily Tim Lair, MD   40 mg at 06/21/20 0839    polyethylene glycol (MIRALAX) packet 17 g  17 g Oral Daily Verlee Rossetti, APRN   17 g at 06/20/20 0947    [START ON 06/22/2020] predniSONE (DELTASONE) tablet 100 mg  100 mg Oral Daily Peri Jefferson, PA        rosuvastatin (CRESTOR) tablet 40 mg  40 mg Oral Daily Tim Lair, MD   40 mg at 06/21/20 2130    senna-docusate (SENNA-PLUS) 8.6-50 mg per tablet 1 tablet  1 tablet Oral BID Tim Lair, MD   1 tablet at 06/20/20 2103    sodium chloride 0.9 % flush 3 mL  3 mL IV Push Q8H Moreines, Reynolds Bowl, MD   3 mL at 06/19/20 2053    sulfamethoxazole-trimethoprim (BACTRIM DS;CO-TRIMOXAZOLE DS) 800-160 mg per tablet 1 tablet  1 tablet Oral Once per day on Mon Wed Fri Moreines, Reynolds Bowl, MD   1 tablet at 06/20/20 0945    tamsulosin (FLOMAX) 24 hr capsule 0.4 mg  0.4 mg Oral Nightly Moreines, Reynolds Bowl, MD   0.4 mg at 06/20/20 2104       PRN:   acetaminophen  650 mg Oral Q6H PRN    carboxymethylcellulose  1 drop Both Eyes TID PRN    dextrose 50% in water (D50W)  12.5 g IV Push Q15 MIN PRN    dextrose 50% in water (D50W)  25 g IV Push Q15 MIN PRN    Enteral Hypoglycemia Management if Blood Glucose 50 - 69 mg/dL or 70 - 79 mg/dL with symptoms of Hypoglycemia  1 each Oral Q15 MIN PRN  Enteral Hypoglycemia Management if Blood Glucose less than 50 mg/dL  2 each Oral W09 MIN PRN    glucagon  1 mg Intramuscular Once PRN    LORazepam  0.5 mg Oral Q6H PRN    nicotine (polacrilex)  2 mg Oral Q2H PRN    oxyCODONE  5 mg Oral Q3H PRN    polyethylene glycol  17 g Oral DAILY PRN    simethicone  80 mg Oral Q6H PRN    sodium chloride  3 mL IV Push PRN for Line Care       Infusions:          Objective     VITALS  Temp:  [97.6 ?F (36.4 ?C)-98 ?F (36.7 ?C)] 98 ?F (36.7 ?C)  Pulse:  [62-86] 86  Resp:  [18-20] 20  BP: (90-94)/(53-59) 94/55  SpO2:  [88 %-98 %] 93 %    Physical Exam:   Physical Exam  Constitutional:       General: He is not in acute distress.  HENT:      Nose:      Comments: NC In place  Eyes:      General: No scleral icterus.     Conjunctiva/sclera: Conjunctivae normal.   Cardiovascular:      Rate and Rhythm: Normal rate and regular rhythm.      Heart sounds: Normal heart sounds.   Pulmonary:      Effort: Pulmonary effort is normal. No respiratory distress.      Breath sounds: Normal breath sounds.   Abdominal:      General: Bowel sounds are normal. There is distension.      Palpations: Abdomen is soft.      Tenderness: There is no abdominal tenderness.   Musculoskeletal:      Comments: Compression stocking in place bilaterally   Skin:     Coloration: Skin is not jaundiced.   Neurological:      Mental Status: He is alert.      Comments: Speech fluent. No facial asymmetry.        Lab Review:   Lab Results   Component Value Date    WBC 22.2 (H) 06/20/2020    HGB 11.0 (L) 06/20/2020    HCT 31.4 (L) 06/20/2020 MCV 97.5 (H) 06/20/2020    PLT 246 06/20/2020     Lab Results   Component Value Date    CREATININE 1.87 (H) 06/21/2020    BUN 48 (H) 06/21/2020    NA 140 06/21/2020    K 3.8 06/21/2020    CL 99 06/21/2020    CO2 30 06/21/2020     Lab Results   Component Value Date    ALT 33 06/17/2020    AST 21 06/17/2020    ALKPHOS 125 (H) 06/17/2020    BILITOT 0.3 06/17/2020       Micro:   UCx 10/22: No Growth   SARS CoV-2 (COVID-19) 10/21: Negative    Diagnostic Review:   ECHO 10/22:  * Study limited by poor acoustic windows.  * Suboptimal visualization of the left ventricle, but it appears to be normal size with normal function.  No regional wall motion abnormalities.  LVEF estimated by visual assessment was between 55-60%.  * Suboptimal visualization of the right ventricle, but it appears to be dilated with mildly decreased function.  * There is beat to beat variability in the LV and RV function due to frequent ventricular ectopy.  * IVC diameter < 2.1 cm that collapses > 50%  with a sniff suggests normal RAP (0-5 mmHg, mean 3 mmHg).  * No significant pericardial effusion.  * Compared with the prior study, dated 05/29/2020, there are changes noted.  The RV appears dilated with mildly reduced function.    CTA CHEST (PE) 10/22: IMPRESSION:  1.  No evidence of pulmonary embolism, subject to limitations described above.  2.  Interval enlargement of multiple lung metastases with stable thoracic lymphadenopathy, consistent with an overall progression of patient's metastatic disease. Continued follow-up per oncological protocols is recommended.  3.  Main pulmonary artery is enlarged, nonspecific but may suggest underlying pulmonary artery hypertension.    CXR 10/21:    IMPRESSION:  Cardiomegaly and central vascular congestion with a suggested component of mild pulmonary edema, consistent with a fluid overload picture. Superposed infectious process would be difficult to exclude radiographically in this setting and should be correlated for clinically.     Metastatic lung lesions are again noted, better assessed on prior [facility] scan of 05/17/2020. Continued follow-up per oncological protocols is recommended.      Pathology:   FINAL DIAGNOSIS  1) LUNG, RIGHT LOWER, FINE NEEDLE ASPIRATION/WASH:           - SPECIMEN IS ADEQUATE FOR INTERPRETATION.   - POSITIVE FOR MALIGNANT CELLS.   - CONSISTENT WITH METASTASIS.   - SEE NOTE.     Note: Tumor cells are positive for p40 and Gata-3. They are negative for TTF-1. A clinical history of multiple lung nodules is noted. The morphologic features of this tumor are similar to the bladder biopsy ([id number]), reviewed for comparison purposes   only. Based on the overall features metastatic carcinoma from known urothelial primary is favored. Clinical and imaging correlation is recommended     FINAL DIAGNOSIS  1. BLADDER, LEFT LATERAL WALL AND TRIGONE, TRANSURETHRAL RESECTION:          - INVASIVE PAPILLARY UROTHELIAL CARCINOMA, HIGH GRADE        - TUMOR INVADES INTO LAMINA PROPRIA        - MUSCULARIS PROPRIA IDENTIFIED BUT NOT INVOLVED BY TUMOR     2. LEFT URETER TUMOR, BIOPSY:          - SUSPICIOUS FOR HIGH GRADE UROTHELIAL CARCINOMA (SEE NOTE)     Impression/Plan:   69 y/o male with PMHx of HTN, T2DM, CAD (s/p MI, PCI x4), COPD with Metastatic Urothelial Carcinoma (originally thought to have Stage IV NSCLC, after p/w DOE in 06/2019, PET 11/04/19 w/ hyperavid lung nodules w/ diffuse adenopathy in neck/chest and upper abdomen, s/p infraclavicular LN bx 11/18/19 w/ focal atypical cells c/f carcinoma, s/p EBUS w/ bx of station 7 LN c/w metastatic SCC; s/p Carbo/Taxol/Pembro x4 cycles (11/2019-02/2020 c/c/b neuropathy requiring dose reduction and 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 (on steroid taper)), with recent admission 04/2020 for hematuria, s/p Cystoscopy with clot evacuation, TURBT, L retrograde pyelogram, and noted 3x4cm papillary tumor involving L trigone and L lateral bladder wall on 9/26 with path c/w Urothelial carcinoma, s/p repeat EBUS 9/28 with RLL FNA/Wash positive for malignant cells (similar morphologically to bladder pathology, therefore felt to be metastatic carcinoma from known urothelial primary), plan to start Enfortumab Vedotin) who presents with shortness of breath, CXR with cardiomegaly and central vascular congestion s/f fluid overload; CTA negative for PE but with interval enlargement of multiple lung mets, cardiology/cardio-onc consulted, currently on Lasix 60mg  IV QD, Pred 110mg  QD, awaiting cMRI, remains on 4L NC. Oncology now consulted given due for chemotherapy initiation 10/28.    --  no indication for inpatient treatment for Metastatic Urothelial Carcinoma at this time. Currently scheduled for follow up with Dr Kathrynn Ducking 10/28, if remains admitted at that time, will reschedule.   --per patient has history of herniated disc and used to get injections for pain control. Per d/w attending, can consider outpatient referral to to Pain Management for evaluation.   --upon my exam, patient had negative ROS, therefore at this time no uncontrolled symptoms for palliative care to manage, can consider social work consult for ongoing support.   --Dr Kathrynn Ducking updated by Dr Meredith Mody today.     Thank you for involving [place] in the care of this patient. Recommendations were communicated with the primary team. Oncology will Continue to Follow.     Patient was evaluated: In person    Signed:  Loreta Ave, Cordelia Poche  06/21/2020  12:28 PM    I saw and examined the patient with PA Adinolfi and I agree with the history, physical assessment and plan.  He presents with symptoms of volume overload and is being treated with diuretics and steroids.   He was due to start enfortumab this week and we discussed that we will facilitate rescheduling outpatient treatment with Dr. Kathrynn Ducking for when he leaves the hospital (likely only a 1 week delay).   We discussed getting over the hurdle of this admission.  He has two new grandchildren he is looking forward to spending time with.   He did discuss back pain from herniated discs and we discussed possible referral as an outpatient to the pain team since he is not a surgical candidate.

## 2020-06-23 NOTE — Discharge Summary
[facility]    Med/Surg Discharge Summary    Patient Data:    Patient Name: Paul Hunter Admit date: 06/16/2020   Age: 69 y.o. 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 F/u with cardio-oncology   2. F/u with cardiology   3. F/u with oncology   4. F/u with PCP     Follow-up Information:  [facility]  [address] [place] [address]  [phone]        Name), No Pcp (Do Not Change      you need to find a primary care provider who can manage all specialties involved in your care    [facility], Theotis Burrow., APRN  [address] [place]  [address]  [place] [address]  [phone]    On 06/24/2020  at 8:30AM for Telephone appointment (Cardiology)       Future Appointments   Date Time Provider Department Center   06/24/2020  8:30 AM Jeanelle Malling., APRN SMIL CARD ON [facility] [place] M   06/24/2020  1:30 PM [facility] [place] TOBACCO SERVICE PHARMACIST SMIL SMOK CE [facility] [place] M   06/27/2020  3:00 PM Kozhevnikov, Dmitry, DO SMIL PALL GD [facility] CAD   06/30/2020 11:00 AM [facility] [place] BLD NURSE SMIL PHLEB [facility]/SRC LAB   06/30/2020 11:30 AM Mastrianni, Tawny Asal, APRN SMIL URO ONC [facility] [place] M   06/30/2020 12:30 PM [facility] MED ONC INFUSION MASTER CHAIR SMIL MULT SP [facility] [place] M   06/30/2020  4:30 PM Catalina Antigua., MD UROLOG [facility] Y[facility] CAD   07/07/2020 12:15 PM [facility] [place] BLD NURSE SMIL PHLEB [facility]/SRC LAB   07/07/2020 12:45 PM Oneita Kras, MD SMIL URO ONC [facility] [place] M   07/07/2020  1:15 PM [facility] MED ONC INFUSION MASTER CHAIR SMIL MULT SP [facility] [place] M 07/14/2020 12:00 PM [facility] [place] BLD NURSE SMIL PHLEB [facility]/SRC LAB   07/14/2020  1:00 PM Oneita Kras, MD SMIL URO ONC [facility] [place] M   07/14/2020  2:00 PM [facility] MED ONC INFUSION MASTER CHAIR SMIL MULT SP [facility] [place] M   08/01/2020  4:00 PM Nikki Dom, MD SM THOR NOHA [facility] [place] M   08/12/2020 10:00 AM Abe People, MD [facility] DEVN [facility] CAD       Hospital Course:     Hospital Course:   [phone] PMH 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 Initial evaluation notable for significant LEE and new oxygen requirement of 2L. CPTE negative for PE but with possible progression of lung mets.     He was started on IV diuresis with rapid response and increase in urine volume. He develop a mild AKI and diuresis was reduced from IV BID dosing to IV daily dosing with improvement in  Cr and ongoing improvement in volume status. He did develop hypotension with diuresis for which he received 2d of low dose midodrine with ongoing improvement in renal function. Midodrine discontinued on discharge.     He underwent cardiac MRI with ongoing inflammatory edema per cardiooncology, so he was started on mycophenolate 1g BID in addition to his high dose steroids. The goal is to wean the steroids in case they're contributing to his volume overload.     He did have a fall on 10/27 when standing up from seated position on the toilet, head Mingo negative, no LOC and was felt to be orthostasis related. He was evaluated by PT who recommended home PT with 24h care. He will f/u with cardio-onc for further management of his diuretic dosing. Leukocytosis was felt to be secondary to high dose steroids, no localizing features concerning for infection.     Inpatient Consultants and summary of recommendations:  Cardiooncology 06/23/20  68?y/o male?with PMHx of HTN, T2DM, CAD?(s/p?MI,?PCI x4), COPD?with?Metastatic Urothelial Carcinoma (originally thought to have Stage IV NSCLC, after p/w DOE in 06/2019,?PET 11/04/19 w/ hyperavid lung nodules w/ diffuse adenopathy in neck/chest and upper abdomen, s/p infraclavicular LN bx 11/18/19 w/ focal atypical cells c/f carcinoma, s/p EBUS w/ bx of station 7 LN c/w metastatic SCC;?s/p?Carbo/Taxol/Pembro x4 cycles (11/2019-02/2020 c/c/b neuropathy requiring dose reduction and 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 (on steroid taper)), with recent admission 04/2020 for hematuria, s/p Cystoscopy with?clot evacuation, TURBT, L retrograde pyelogram, and noted 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   ?  --appreciate Cardio-Onc and Cardiology involvement. Steroids per Cardio-onc.   --plan was to initiated treatment for his metastatic Urothelial Carcinoma today as an outpatient, will reschedule to next week.   --Consider PT and SW Consult.   --Primary Oncologist: Dr Kathrynn Ducking (last updated 10/26)    Cardiology 06/23/20  Elmon Else Pacer?is a 69 y.o.?male?with a PMH of urothelial cancer, CAD s/p multiple PCIs, and concerns for ICI mediated myocarditis this summer on MRI, improved EF with high dose steroids who was admitted on 06/16/2020 with worsening SOB and volume overload. Echo done in this admission was evident for EF of 55-60%, no regional WMA with dilated RV with mild RV dysfunction.?Per cardio-onc MRI heart yesterday +T2 and + troponins?- per their recs recommended?mycophenolate in addition to steroids 110 mg/day.  ?  He has now clinically improved, and if he is able to come off of oxygen this morning he may be able to be transitioned to oral diuretics. Would recommend 20mg  of torsemide daily. Will follow up with cardio-oncology as an outpatient, has appointment scheduled for tomorrow.   ?  Recommendations:  1. Almost off oxygen, if comes off oxygen this AM can be discharged from cardiology perspective  2. Defer to cardio-oncology regarding management of immunosuppressants   3. Would change home diuretics to torsemide 20mg  daily   4. Continue aspirin and statin for secondary prevention of CAD  5. Patient should weigh himself at home daily, if he gains more than 2 lbs over 2 days or more than 5 lbs over  5 days he should call his cardiology office to determine if a dose adjustment of his diuretics are necessary. Same if he loses weight or begins to feel symptoms of orthostasis.   6. Cardiology will now sign off, please do not hesitate to call us with any questions or concerns.     Pertinent lab findings and test results:   Objective:     Recent Labs   Lab 06/18/20  0607 06/18/20  0607 06/19/20  0556 06/19/20  0556 06/20/20  0533   WBC 21.1*  --  20.7*  --  22.2*   HGB 10.3*   < > 10.1*   < > 11.0*   HCT 29.9*   < > 29.7*   < > 31.4*   PLT 249   < > 248   < > 246    < > = values in this interval not displayed.    Recent Labs   Lab 06/16/20  2321 06/17/20  1333 06/18/20  0607   NEUTROPHILS 91.0* 92.7* 86.5*      Recent Labs   Lab 06/22/20  0449 06/22/20  0758 06/22/20  1428 06/22/20  1720 06/22/20  2138 06/23/20  0623 06/23/20  0754   NA 138  --  138  --   --  140  --    K 5.3   < > 5.1   < >  --  4.2  --    CL 98   < > 99   < >  --  101  --    CO2 27   < > 26   < >  --  28  --    BUN 44*   < > 43*   < >  --  38*  --    CREATININE 1.95*   < > 1.77*   < >  --  1.68*  --    GLU 101*   < > 212*   < >   < > 106* 109*   ANIONGAP 13   < > 13   < >  --  11  --     < > = values in this interval not displayed.    Recent Labs   Lab 06/20/20  0533 06/20/20  0533 06/21/20  4132 06/22/20  0449 06/22/20  1428 06/23/20  0623   CALCIUM 9.0  --    < > 9.2 9.1 9.0   MG 2.3   < >  --   --   --  2.3   PHOS 3.6  --   --   --   --   --     < > = values in this interval not displayed.      Recent Labs   Lab 06/16/20  1654 06/16/20  1654 06/16/20  2321 06/16/20  2321 06/17/20  1333   ALT 34  --  33  --  33   AST 13   < > 19   < > 21   ALKPHOS 118   < > 128*   < > 125*   BILITOT 0.2   < > <0.2   < > 0.3   BILIDIR  --   --  <0.2   < > <0.2    < > = values in this interval not displayed.    Recent Labs   Lab 06/16/20  2321   LABPROT 10.3   INR 0.94        Culture Information:  Recent Labs   Lab 06/17/20  0103   LABURIN No Growth     Imaging:   Imaging results last 1 week:  CXR    Result Date: 06/17/2020  Cardiomegaly and central vascular congestion with a suggested component of mild pulmonary edema, consistent with a fluid overload picture. Superposed infectious process would be difficult to exclude radiographically in this setting and should be correlated for clinically. Metastatic lung lesions are again noted, better assessed on prior [place] scan of 05/17/2020. Continued follow-up per oncological protocols is recommended. Reported And Signed By: Lavinia Sharps, MD  [facility] Radiology and Biomedical Imaging    [place] wo IV Contrast    Result Date: 06/22/2020  No acute intracranial abnormality. Report Initiated By:  Kara Mead, MD Reported And Signed By: Cori Razor, MD  [facility] Radiology and Biomedical Imaging    MRI Heart w wo IV Contrast w Velocity Flow    Result Date: 06/21/2020  1. Normal left ventricular size. Normal left ventricular systolic function. LVEF: 60 %. Hypokinesis of the basal to mid inferoseptum and the basal to apical inferior wall, with associated thinning of the myocardium and subendocardial delayed enhancement. Findings compatible with ischemic cardiomyopathy. No findings suggestive of acute myocarditis. 2. Normal right ventricular size. Normal right ventricular systolic function. RVEF: 60 %. No right ventricular delayed myocardial enhancement. 3. No significant valvular abnormalities. 4. Mild left atrial enlargement. Normal right atrial size. 5. Innumerable bilateral pulmonary metastases, better characterized on recent CTA chest dated 06/17/2020. 6. Ascending aortic aneurysm measuring 4.1 cm in diameter. Report Initiated By:  Reynaldo Minium Reported And Signed By: Maryjane Hurter, MD  [facility] Radiology and Biomedical Imaging    CTA Chest (PE) w IV Contrast    Result Date: 06/17/2020  1.  No evidence of pulmonary embolism, subject to limitations described above. 2.  Interval enlargement of multiple lung metastases with stable thoracic lymphadenopathy, consistent with an overall progression of patient's metastatic disease. Continued follow-up per oncological protocols is recommended. 3.  Main pulmonary artery is enlarged, nonspecific but may suggest underlying pulmonary artery hypertension. These findings were discussed with Dr. Kem Boroughs at 4:05 AM on 06/17/2020. Reported And Signed By: Lavinia Sharps, MD  [facility] Radiology and Biomedical Imaging    Diet:  Heart healthy diet  Mobility: Highest Level of mobility - ACTUAL: Mobility Level 6, Walk 10+ steps,AM PAC 18-21  Physical Therapy Disposition Recommendation: Home  Additional Physical Therapy Disposition Recommendations: Home Physical Therapy    Physical Exam     Discharge vitals:   Temp:  [97.6 ?F (36.4 ?C)-97.8 ?F (36.6 ?C)] 97.8 ?F (36.6 ?C)  Pulse:  [56-80] 56  Resp:  [16-20] 16  BP: (115-135)/(67-80) 123/76  SpO2:  [94 %-99 %] 99 %  Device (Oxygen Therapy): nasal cannula  O2 Flow (L/min):  [1] 1   Pertinent Findings of Physical Exam: Unremarkable  Cognitive Status at Discharge: Alert and Oriented x 3    Discharge Physical Exam:  Physical Exam  Constitutional:       Appearance: Normal appearance. He is obese.   HENT:      Head: Normocephalic and atraumatic.      Mouth/Throat:      Mouth: Mucous membranes are moist.   Eyes:      Extraocular Movements: Extraocular movements intact.      Pupils: Pupils are equal, round, and reactive to light.   Cardiovascular:      Rate and Rhythm: Normal rate and regular rhythm.      Pulses:  Normal pulses.      Heart sounds: Normal heart sounds.   Pulmonary:      Effort: Pulmonary effort is normal.      Breath sounds: Normal breath sounds.   Abdominal:      General: Bowel sounds are normal.      Palpations: Abdomen is soft.   Musculoskeletal:         General: Normal range of motion.      Cervical back: Normal range of motion.   Skin:     General: Skin is warm and dry.      Capillary Refill: Capillary refill takes less than 2 seconds.   Neurological:      General: No focal deficit present.      Mental Status: He is alert.   Psychiatric:         Mood and Affect: Mood normal.       Allergies   No Known Allergies     PMH PSH   Past Medical History:   Diagnosis Date   ? Anxiety    ? CAD (coronary artery disease)    ? Diabetes mellitus (HC Code) (HC CODE)    ? Difficult intubation 05/22/2020    Patient with significant soft tissue in oropharynx. Glidescope intubation revealed anterior cords with floppy soft tissue obstruction. Patient will require video laryngoscopy for airway management going forward.   ? GERD (gastroesophageal reflux disease)    ? Hyperlipidemia    ? Hypertension    ? Hypertrophy of prostate without urinary obstruction and other lower urinary tract symptoms (LUTS)    ? Other testicular hypofunction    ? Spinal stenosis 09/28/2018   ? Tobacco abuse    ? Vitamin D deficiency       Past Surgical History:   Procedure Laterality Date   ? CAROTID STENT  08/28/2003   ? CHOLECYSTECTOMY  11/07/2016   ? KNEE SURGERY      torn meniscus   ? LAMINECTOMY  10/07/2008   ? LUMBAR DISC SURGERY      herniated disc        Social History Family History   Social History     Tobacco Use   ? Smoking status: Current Every Day Smoker     Types: Cigarettes   Substance Use Topics   ? Alcohol use: Not on file      No family history on file.       Discharge Medications:   Discharge:   Current Discharge Medication List      START taking these medications    Details   mycophenolate mofetil (CELLCEPT) 500 mg tablet Take 2 tablets (1 g total) by mouth 2 (two) times daily.  Qty: 120 tablet, Refills: 0  Start date: 06/23/2020, End date: 07/23/2020      torsemide (DEMADEX) 20 mg tablet Take 1 tablet (20 mg total) by mouth daily.  Qty: 30 tablet, Refills: 0  Start date: 06/23/2020, End date: 07/23/2020         CONTINUE these medications which have CHANGED    Details   lisinopriL (PRINIVIL,ZESTRIL) 2.5 mg tablet Take 1 tablet (2.5 mg total) by mouth daily. Hold until follow-up with Cardiology  Qty: 30 tablet, Refills: 11  Start date: 06/23/2020      metoprolol succinate XL (TOPROL-XL) 25 mg 24 hr tablet Take 0.5 tablets (12.5 mg total) by mouth daily. Hold until follow-up with Cardiology  Qty: 30 tablet, Refills: 0  Start date: 06/23/2020  CONTINUE these medications which have NOT CHANGED    Details   albuterol sulfate 90 mcg/actuation HFA aerosol inhaler Inhale 2 puffs into the lungs every 6 (six) hours as needed for wheezing or shortness of breath.  Qty: 6.7 g, Refills: 0      aspirin 81 MG EC tablet Take 81 mg by mouth daily.      atorvastatin (LIPITOR) 80 mg tablet Take 1 tablet (80 mg total) by mouth nightly.  Qty: 30 tablet, Refills: 6      calcium carbonate-vitamin D3 500 mg(1,250mg ) -400 unit per tablet Take 1 tablet by mouth 2 (two) times daily.  Qty: 60 tablet, Refills: 2      carboxymethylcellulose (REFRESH PLUS) 0.5 % ophthalmic solution in dropperette Place 1 drop into both eyes 3 (three) times daily as needed.      clopidogreL (PLAVIX) 75 mg tablet Take 75 mg by mouth daily.      escitalopram oxalate (LEXAPRO) 10 mg tablet Take 1 tablet (10 mg total) by mouth nightly.  Qty: 90 tablet, Refills: 3      fexofenadine (ALLEGRA) 180 mg tablet Take 180 mg by mouth daily as needed.       gabapentin (NEURONTIN) 300 mg capsule Take 2 capsules (600 mg total) by mouth nightly.  Qty: 60 capsule, Refills: 0    Associated Diagnoses: Lung nodule      LORazepam (ATIVAN) 0.5 mg tablet Take 1 tablet (0.5 mg total) by mouth every 6 (six) hours as needed for anxiety.  Qty: 15 tablet, Refills: 0    Associated Diagnoses: Chronic kidney disease, unspecified CKD stage      nicotine (NICODERM CQ) 21 mg transdermal patch APPLY 1 PATCH DAILY. ROTATE PATCH SITES. LEAVE ON FOR 24 HOURS UNLESS OTHERWISE DIRECTED.      nicotine polacrilex (COMMIT) 2 mg lozenge Place 1 lozenge (2 mg total) inside cheek every 2 (two) hours as needed for smoking cessation.  Qty: 108 each, Refills: 2      oxybutynin XL (DITROPAN-XL) 5 mg 24 hr tablet Take 1 tablet (5 mg total) by mouth daily.  Qty: 90 tablet, Refills: 0      oxyCODONE (OXYCONTIN) 10 mg 12 hr extended release tablet Take 1 tablet (10 mg total) by mouth every 12 (twelve) hours.  Qty: 60 tablet, Refills: 0  Start date: 06/16/2020, End date: 07/16/2020      oxyCODONE (ROXICODONE) 5 mg Immediate Release tablet Take 1 tablet (5 mg total) by mouth every 3 (three) hours as needed for up to 10 doses.  Qty: 200 tablet, Refills: 0  Start date: 06/16/2020      pantoprazole (PROTONIX) 40 mg tablet Take 1 tablet (40 mg total) by mouth daily.  Qty: 30 tablet, Refills: 2      polyethylene glycol (MIRALAX) 17 gram packet Take 1 packet (17 g total) by mouth daily as needed for constipation. Mix in 8 ounces of water, juice, soda, coffee or tea prior to taking.  Qty: 14 each, Refills: 0    Associated Diagnoses: Chronic kidney disease, unspecified CKD stage      !! predniSONE (DELTASONE) 10 mg tablet Take 3 tablets (30 mg total) by mouth daily for 7 days, THEN 2 tablets (20 mg total) daily for 7 days, THEN 1 tablet (10 mg total) daily for 7 days, THEN 0.5 tablets (5 mg total) daily for 7 days, THEN 0.5 tablets (5 mg total) daily for 7 days. Take with food.Rob Bunting: 49 tablet, Refills: 0  Start date:  08/09/2020, End date: 09/13/2020      senna-docusate (SENNA-PLUS) 8.6-50 mg tablet Take 1 tablet by mouth nightly.  Qty: 30 tablet, Refills: 0    Associated Diagnoses: Constipation, unspecified constipation type      simethicone (MYLICON) 80 mg chewable tablet Take 1 tablet (80 mg total) by mouth every 6 (six) hours as needed for flatulence.  Qty: 30 tablet, Refills: 0    Associated Diagnoses: Chronic kidney disease, unspecified CKD stage      tamsulosin (FLOMAX) 0.4 mg 24 hr capsule Take 1 capsule (0.4 mg total) by mouth nightly.  Qty: 30 capsule, Refills: 2      belladonna alkaloids-opium (B&O SUPPRETTES) 16.2-30 mg suppository Place 1 suppository rectally 2 (two) times daily as needed for bladder spasm.  Qty: 12 suppository, Refills: 0    Associated Diagnoses: Pulmonary emphysema, unspecified emphysema type (HC Code) (HC CODE)      !! predniSONE (DELTASONE) 10 mg tablet Take 13 tablets (130 mg total) by mouth daily for 7 days, THEN 12 tablets (120 mg total) daily for 7 days, THEN 11 tablets (110 mg total) daily for 7 days, THEN 10 tablets (100 mg total) daily for 7 days, THEN 9 tablets (90 mg total) daily for 7 days, THEN 8 tablets (80 mg total) daily for 7 days, THEN 7 tablets (70 mg total) daily for 7 days, THEN 6 tablets (60 mg total) daily for 7 days, THEN 5 tablets (50 mg total) daily for 7 days, THEN 4 tablets (40 mg total) daily for 7 days. Take with food.Rob Bunting: 595 tablet, Refills: 0      !! predniSONE (DELTASONE) 50 mg tablet Take with food.  Qty: 56 tablet, Refills: 0      sulfamethoxazole-trimethoprim (BACTRIM DS;CO-TRIMOXAZOLE DS) 800-160 mg per tablet Take 1 tablet by mouth Every Monday, Wednesday, and Friday.  Qty: 28 tablet, Refills: 0    Associated Diagnoses: Chronic kidney disease, unspecified CKD stage walker Misc Use as directed.  Qty: 1 each, Refills: 0       !! - Potential duplicate medications found. Please discuss with provider.      STOP taking these medications       furosemide (LASIX) 40 mg tablet            Electronically Signed:  Roger Kill, MD - Hospitalist Attending - Best Contact Information: [email or web address]  Discharge time spent: Greater than 30 minutes were spent in the discharge process for this patient which may have included, but was not necessarily limited to, the final physical examination, discussion of the patient's hospital stay with the patient and other providers, instructions for their care once discharged, medication reconciliation, and preparation of prescriptions.

## 2020-06-24 ENCOUNTER — Ambulatory Visit: Admit: 2020-06-24 | Payer: PRIVATE HEALTH INSURANCE | Attending: Adult Health

## 2020-06-24 ENCOUNTER — Encounter: Admit: 2020-06-24 | Payer: PRIVATE HEALTH INSURANCE | Attending: Adult Health

## 2020-06-24 ENCOUNTER — Ambulatory Visit: Admit: 2020-06-24 | Payer: PRIVATE HEALTH INSURANCE

## 2020-06-24 DIAGNOSIS — I519 Heart disease, unspecified: Secondary | ICD-10-CM

## 2020-06-24 DIAGNOSIS — Z720 Tobacco use: Secondary | ICD-10-CM

## 2020-06-24 DIAGNOSIS — I2510 Atherosclerotic heart disease of native coronary artery without angina pectoris: Secondary | ICD-10-CM

## 2020-06-24 DIAGNOSIS — R0609 Other forms of dyspnea: Secondary | ICD-10-CM

## 2020-06-24 DIAGNOSIS — R0600 Dyspnea, unspecified: Secondary | ICD-10-CM

## 2020-06-24 DIAGNOSIS — I5023 Acute on chronic systolic (congestive) heart failure: Secondary | ICD-10-CM

## 2020-06-24 DIAGNOSIS — I10 Essential (primary) hypertension: Secondary | ICD-10-CM

## 2020-06-24 DIAGNOSIS — F419 Anxiety disorder, unspecified: Secondary | ICD-10-CM

## 2020-06-24 DIAGNOSIS — I408 Other acute myocarditis: Secondary | ICD-10-CM

## 2020-06-24 DIAGNOSIS — N400 Enlarged prostate without lower urinary tract symptoms: Secondary | ICD-10-CM

## 2020-06-24 DIAGNOSIS — M4800 Spinal stenosis, site unspecified: Secondary | ICD-10-CM

## 2020-06-24 DIAGNOSIS — E119 Type 2 diabetes mellitus without complications: Secondary | ICD-10-CM

## 2020-06-24 DIAGNOSIS — K219 Gastro-esophageal reflux disease without esophagitis: Secondary | ICD-10-CM

## 2020-06-24 DIAGNOSIS — E785 Hyperlipidemia, unspecified: Secondary | ICD-10-CM

## 2020-06-24 DIAGNOSIS — E559 Vitamin D deficiency, unspecified: Secondary | ICD-10-CM

## 2020-06-24 DIAGNOSIS — E291 Testicular hypofunction: Secondary | ICD-10-CM

## 2020-06-24 DIAGNOSIS — T884XXA Failed or difficult intubation, initial encounter: Secondary | ICD-10-CM

## 2020-06-24 NOTE — Progress Notes
[facility] Cardio-OncologyVideo Telehealth visitDavid J Hunter is a 69 y.o.male returns for office follow up today.CAD, ICI myocarditis.He has a complex medical, oncologic and cardiac history.?He has a PMH of CAD, s/p MI remotely in 1996, PCI X 4, LVEF 39% by SPECT 2021 with recovery of LVEF by echo 01/2020 to 62%.  He has Stage IV NSCLC (abdomen, chest).  He transitioned his care from [place] to [place], started on?Carbo/Taxol/Pembro 11/28/19?s/p 2 cycles, and 2 more cycles at [facility] most recent 03/10/20.  He was seen for initial consult by CardioOncology 04/08/2020. A cardiac MRI was done 04/19/2020 and was consistent with ICI myocarditis.  Started on Prednisone 130 mg daily and therapy placed on hold.Carbo/Taxol/Pembro remains on hold.?A PET stress test was performed 05/06/2020:  Highly abnormal PET myocardial perfusion study following pharmacologic vasodilation with regadenoson and at rest with high risk features. ?Perfusion imaging was abnormal showing a large sized, moderate to severe intensity, reversible perfusion defect in the basal to apical inferior and inferolateral walls consistent with ischemia; and a medium to large sized, moderate intensity, reversible perfusion defect in the mid to apical anterior and anterolateral walls consistent with ischemia. ?There is visual transient ischemic dilation ?There was transient post-stress LV dysfunction, resting ejection fraction 40% and stress ejection fraction 36% with abnormal regional wall motion showing global hypokinesis and inferior akinesis. ?The left ventricle was severely enlarged in size. ?Stress electrocardiogram was abnormal due to ischemic ECG changes. ?[place] performed for attenuation correction showed coronary artery calcification in the left main artery (moderate), left anterior descending artery (severe), diagonal artery (severe), left circumflex artery (mild), and right coronary artery (mild). ???Non-cardiac findings of the [place] are described in a separate report from Radiology. ?No prior study available for comparison.Global myocardial blood flow was reduced at 0.78 ml/g/min during stress and normal at 0.68 ml/g/min during rest. ?Global myocardial flow reserve was reduced at 1.15.?Repeat cMRI 05/12/2020:  IMPRESSION:1. Mildly enlarged left ventricular size. Mildly depressed left ventricular systolic function. LVEF: 49%. There is mild to moderate asymmetric myocardial hypertrophy with the basal to mid septum measuring the thickest, up to 16mm at the basal anteroseptum. ?There is basal to mid inferoseptal/inferior wall hypokinesis and associated thinning. Two areas of ischemic delayed enhancement: ?subendocardial delayed enhancement basal to mid inferior/inferoseptal wall with 50-75% transmurality, consistent with known myocardial infarction in the distribution of the RCA and a second area that was possibly present on the prior study 04/19/20 but more subtle with subendocardial delayed enhancement in the basal to mid anterior/anteroseptal/anterolateral wall with ~25% transmurality (consistent with prior LAD MI). T2 hyperintensity is noted in a similar location as prior study and heterogeneous mid myocardial delayed enhancement in the basal lateral segments, concerning for myocarditis, possibly related to check point inhibitor therapy and known prior infarct in LAD and RCA distribution. 2. Normal right ventricular size. Normal right ventricular systolic function. ?RVEF: 54%. No right ventricular delayed myocardial enhancement.3. No significant valvular abnormalities.4. Moderate biatrial enlargement5. Dilatation of the ascending aorta up to 4 cm. ?Enlarged pulmonary artery may be seen in pulmonary hypertension. Compared to prior study dated 04/19/2020, LVEF appears similar, with possible slight improvement from 44% and RVEF has normalized. Due to technical and artifactual differences, T2 appears in similar location, but cannot quantify differences. DGE burden also appear in the same locations as prior study. ?Since last evaluated in the office, results of testing as above.  He has been admitted to [facility] X 5 since that time; Briefly summarized here9/13 to 9/16:  CHF and hematuria.  9/23 - 9/24:  S/p outpatient cystoscopy and admitted with urinary retention and pain9/25 - 05/25/20:  Hematuria. Cystoscopy X 2 with clot evacuation, TURBT, papillary tumor and fulguration.  Pathology + for invasive urothelial carcinoma ;  He is scheduled to see Dr. Kathrynn Ducking 10/13/202110/1 to 05/30/2020:  Presyncope likely secondary to hypovolemia, chest pain; Troponins elevated and Prednisone increased back to 130 mg daily. 06/09/2020  Saw Dr Kathrynn Ducking; Reviewed pathology and based on this, patient has metastatic bladder cancer. Plan was to start enfortumab vedotin10/21 to 10/28 Readmitted with CHF, ICI myocarditis.  Troponins remain elevated. Started on Cellcept and continued on high dose steroids.  He was diuresed. BP was low and Lisinopril and Metoprolol were held.  Repeat cMRI 10/26/20211. Normal left ventricular size. Normal left ventricular systolic function. LVEF: 60 %. Hypokinesis of the basal to mid inferoseptum and the basal to apical inferior wall, with associated thinning of the myocardium and subendocardial delayed enhancement. Findings compatible with ischemic cardiomyopathy. No findings suggestive of acute myocarditis.2. Normal right ventricular size. Normal right ventricular systolic function. RVEF: 60 %. No right ventricular delayed myocardial enhancement. 3. No significant valvular abnormalities.4. Mild left atrial enlargement. Normal right atrial size.5. Innumerable bilateral pulmonary metastases, better characterized on recent CTA chest dated 06/17/2020.6. Ascending aortic aneurysm measuring 4.1 cm in diameter.Today, he follows up after discharge for a video visit, [facility] yesterdayAbdominal pain is 5-6/10 pain.  Left lower quadrant abdominal painWeight  306 lbs on admission, to 294 lbs. He has not yet bought a scale, plans to do that todayShortness of breath this morning when he first got up this morning. Slept pretty well, stable 2 pillows and no orthopnea or PND.LE edema is improved. Denies PND, orthopnea. No angina symptoms. Denies palpitations, syncope or presyncope.Discharge meds:Torsemide 20 mgPrednisone 110 mgMycophenolate mofetil 1000 mg bidLisinopril 2.5 and Metoprolol ER 12.5 mg daily are on hold on dischargeProblem list:Patient Active Problem List  Diagnosis Date Noted ? Shortness of breath 06/17/2020 ? Anxiety 06/12/2020 ? Vitamin D deficiency 06/12/2020 ? Chest pain, rule out acute myocardial infarction 05/27/2020 ? Mediastinal lymphadenopathy 05/24/2020 ? Bladder cancer (HC Code) (HC CODE) 05/22/2020 ? Hemorrhagic cystitis 05/21/2020 ? Urinary catheter complication, initial encounter (HC Code) (HC CODE) 05/20/2020 ? DOE (dyspnea on exertion) 05/09/2020 ? Pulmonary emphysema, unspecified emphysema type (HC Code) (HC CODE) 03/28/2020 ? Dyspnea on exertion 03/28/2020 ? Sleep apnea, unspecified type 03/28/2020 ? Squamous cell carcinoma of lung, stage IV (HC Code) (HC CODE) 01/07/2020 ? Spinal stenosis 09/28/2018 ? GERD (gastroesophageal reflux disease) 08/14/2011 ? CAD (coronary artery disease)  ? Diabetes mellitus (HC Code) (HC CODE)  ? Hyperlipidemia  ? Hypertension  Medications:Current Outpatient Medications Medication Sig Dispense Refill ? albuterol sulfate 90 mcg/actuation HFA aerosol inhaler Inhale 2 puffs into the lungs every 6 (six) hours as needed for wheezing or shortness of breath. 6.7 g 0 ? aspirin 81 MG EC tablet Take 81 mg by mouth daily.   ? atorvastatin (LIPITOR) 80 mg tablet Take 1 tablet (80 mg total) by mouth nightly. 30 tablet 6 ? belladonna alkaloids-opium (B&O SUPPRETTES) 16.2-30 mg suppository Place 1 suppository rectally 2 (two) times daily as needed for bladder spasm. 12 suppository 0 ? calcium carbonate-vitamin D3 500 mg(1,250mg ) -400 unit per tablet Take 1 tablet by mouth 2 (two) times daily. 60 tablet 2 ? carboxymethylcellulose (REFRESH PLUS) 0.5 % ophthalmic solution in dropperette Place 1 drop into both eyes 3 (three) times daily as needed.   ? clopidogreL (PLAVIX) 75 mg tablet Take 75 mg by  mouth daily.   ? escitalopram oxalate (LEXAPRO) 10 mg tablet Take 1 tablet (10 mg total) by mouth nightly. 90 tablet 3 ? fexofenadine (ALLEGRA) 180 mg tablet Take 180 mg by mouth daily as needed.    ? gabapentin (NEURONTIN) 300 mg capsule Take 2 capsules (600 mg total) by mouth nightly. 60 capsule 0 ? lisinopriL (PRINIVIL,ZESTRIL) 2.5 mg tablet Take 1 tablet (2.5 mg total) by mouth daily. Hold until follow-up with Cardiology 30 tablet 11 ? LORazepam (ATIVAN) 0.5 mg tablet Take 1 tablet (0.5 mg total) by mouth every 6 (six) hours as needed for anxiety. 15 tablet 0 ? metoprolol succinate XL (TOPROL-XL) 25 mg 24 hr tablet Take 0.5 tablets (12.5 mg total) by mouth daily. Hold until follow-up with Cardiology 30 tablet 0 ? mycophenolate mofetil (CELLCEPT) 500 mg tablet Take 2 tablets (1 g total) by mouth 2 (two) times daily. 120 tablet 0 ? nicotine (NICODERM CQ) 21 mg transdermal patch APPLY 1 PATCH DAILY. ROTATE PATCH SITES. LEAVE ON FOR 24 HOURS UNLESS OTHERWISE DIRECTED.   ? nicotine polacrilex (COMMIT) 2 mg lozenge Place 1 lozenge (2 mg total) inside cheek every 2 (two) hours as needed for smoking cessation. 108 each 2 ? oxybutynin XL (DITROPAN-XL) 5 mg 24 hr tablet Take 1 tablet (5 mg total) by mouth daily. 90 tablet 0 ? oxyCODONE (OXYCONTIN) 10 mg 12 hr extended release tablet Take 1 tablet (10 mg total) by mouth every 12 (twelve) hours. 60 tablet 0 ? oxyCODONE (ROXICODONE) 5 mg Immediate Release tablet Take 1 tablet (5 mg total) by mouth every 3 (three) hours as needed for up to 10 doses. 200 tablet 0 ? pantoprazole (PROTONIX) 40 mg tablet Take 1 tablet (40 mg total) by mouth daily. 30 tablet 2 ? polyethylene glycol (MIRALAX) 17 gram packet Take 1 packet (17 g total) by mouth daily as needed for constipation. Mix in 8 ounces of water, juice, soda, coffee or tea prior to taking. 14 each 0 ? [START ON 08/09/2020] predniSONE (DELTASONE) 10 mg tablet Take 3 tablets (30 mg total) by mouth daily for 7 days, THEN 2 tablets (20 mg total) daily for 7 days, THEN 1 tablet (10 mg total) daily for 7 days, THEN 0.5 tablets (5 mg total) daily for 7 days, THEN 0.5 tablets (5 mg total) daily for 7 days. Take with food.. 49 tablet 0 ? predniSONE (DELTASONE) 10 mg tablet Take 13 tablets (130 mg total) by mouth daily for 7 days, THEN 12 tablets (120 mg total) daily for 7 days, THEN 11 tablets (110 mg total) daily for 7 days, THEN 10 tablets (100 mg total) daily for 7 days, THEN 9 tablets (90 mg total) daily for 7 days, THEN 8 tablets (80 mg total) daily for 7 days, THEN 7 tablets (70 mg total) daily for 7 days, THEN 6 tablets (60 mg total) daily for 7 days, THEN 5 tablets (50 mg total) daily for 7 days, THEN 4 tablets (40 mg total) daily for 7 days. Take with food.. 595 tablet 0 ? predniSONE (DELTASONE) 50 mg tablet Take with food. 56 tablet 0 ? senna-docusate (SENNA-PLUS) 8.6-50 mg tablet Take 1 tablet by mouth nightly. 30 tablet 0 ? simethicone (MYLICON) 80 mg chewable tablet Take 1 tablet (80 mg total) by mouth every 6 (six) hours as needed for flatulence. 30 tablet 0 ? sulfamethoxazole-trimethoprim (BACTRIM DS;CO-TRIMOXAZOLE DS) 800-160 mg per tablet Take 1 tablet by mouth Every Monday, Wednesday, and Friday. 28 tablet 0 ? tamsulosin (FLOMAX) 0.4 mg 24  hr capsule Take 1 capsule (0.4 mg total) by mouth nightly. 30 capsule 2 ? torsemide (DEMADEX) 20 mg tablet Take 1 tablet (20 mg total) by mouth daily. 30 tablet 0 ? walker Misc Use as directed. 1 each 0 No current facility-administered medications for this visit. Allergies:He has No Known Allergies.Review of Systems Constitutional: Positive for malaise/fatigue. HENT: Positive for congestion.  Cardiovascular: Positive for dyspnea on exertion and leg swelling. Negative for chest pain, claudication, cyanosis, irregular heartbeat, near-syncope, orthopnea, palpitations, paroxysmal nocturnal dyspnea and syncope. Respiratory: Positive for cough and shortness of breath.  Musculoskeletal: Positive for back pain and myalgias. Gastrointestinal: Positive for abdominal pain. Negative for diarrhea and excessive appetite. Neurological: Positive for light-headedness and weakness. Negative for loss of balance. Psychiatric/Behavioral: Positive for depression. All other systems reviewed and are negative. Vital Signs: There were no vitals taken for this visit.Wt Readings from Last 3 Encounters: 06/23/20 133.6 kg 06/13/20 (!) 140.6 kg 06/09/20 (!) 141.7 kg No Vital signs as telehealthPhysical ExamNo physical exam as video Pleasant and conversant, in mild distress due to painLabs:Lipid Panel:Lab Results Component Value Date  CHOL 141 05/11/2020  TRIG 133 05/11/2020  HDL 39 (L) 05/11/2020  LDL 75 05/11/2020  06/22/2020 04:49 06/22/2020 14:28 06/23/2020 06:23 Sodium 138 138 140 Potassium 5.3 5.1 4.2 Chloride 98 99 101 CO2 27 26 28  Anion Gap 13 13 11  BUN 44 (H) 43 (H) 38 (H) Creatinine 1.95 (H) 1.77 (H) 1.68 (H)  06/18/2020 06:07 06/19/2020 05:56 06/21/2020 06:33 06/23/2020 06:23 Troponin T 0.06 (H) 0.05 (H) 0.08 (H) 0.07 (H) 05/06/2020:  PET stress testHighly abnormal PET myocardial perfusion study following pharmacologic vasodilation with regadenoson and at rest with high risk features. ?Perfusion imaging was abnormal showing a large sized, moderate to severe intensity, reversible perfusion defect in the basal to apical inferior and inferolateral walls consistent with ischemia; and a medium to large sized, moderate intensity, reversible perfusion defect in the mid to apical anterior and anterolateral walls consistent with ischemia. ?There is visual transient ischemic dilation ?There was transient post-stress LV dysfunction, resting ejection fraction 40% and stress ejection fraction 36% with abnormal regional wall motion showing global hypokinesis and inferior akinesis. ?The left ventricle was severely enlarged in size. ?Stress electrocardiogram was abnormal due to ischemic ECG changes. ?[place] performed for attenuation correction showed coronary artery calcification in the left main artery (moderate), left anterior descending artery (severe), diagonal artery (severe), left circumflex artery (mild), and right coronary artery (mild). ???Non-cardiac findings of the [place] are described in a separate report from Radiology. ?No prior study available for comparison.Global myocardial blood flow was reduced at 0.78 ml/g/min during stress and normal at 0.68 ml/g/min during rest. ?Global myocardial flow reserve was reduced at 1.15.?Repeat cMRI 05/12/2020:  IMPRESSION:1. Mildly enlarged left ventricular size. Mildly depressed left ventricular systolic function. LVEF: 49%. There is mild to moderate asymmetric myocardial hypertrophy with the basal to mid septum measuring the thickest, up to 16mm at the basal anteroseptum. ?There is basal to mid inferoseptal/inferior wall hypokinesis and associated thinning. Two areas of ischemic delayed enhancement: ?subendocardial delayed enhancement basal to mid inferior/inferoseptal wall with 50-75% transmurality, consistent with known myocardial infarction in the distribution of the RCA and a second area that was possibly present on the prior study 04/19/20 but more subtle with subendocardial delayed enhancement in the basal to mid anterior/anteroseptal/anterolateral wall with ~25% transmurality (consistent with prior LAD MI). T2 hyperintensity is noted in a similar location as prior study and heterogeneous mid myocardial delayed enhancement in the basal lateral segments,  concerning for myocarditis, possibly related to check point inhibitor therapy and known prior infarct in LAD and RCA distribution. 2. Normal right ventricular size. Normal right ventricular systolic function. ?RVEF: 54%. No right ventricular delayed myocardial enhancement.3. No significant valvular abnormalities.4. Moderate biatrial enlargement5. Dilatation of the ascending aorta up to 4 cm. ?Enlarged pulmonary artery may be seen in pulmonary hypertension. Compared to prior study dated 04/19/2020, LVEF appears similar, with possible slight improvement from 44% and RVEF has normalized. Due to technical and artifactual differences, T2 appears in similar location, but cannot quantify differences. DGE burden also appear in the same locations as prior study. ?Repeat cMRI 10/26/20211. Normal left ventricular size. Normal left ventricular systolic function. LVEF: 60 %. Hypokinesis of the basal to mid inferoseptum and the basal to apical inferior wall, with associated thinning of the myocardium and subendocardial delayed enhancement. Findings compatible with ischemic cardiomyopathy. No findings suggestive of acute myocarditis.2. Normal right ventricular size. Normal right ventricular systolic function. RVEF: 60 %. No right ventricular delayed myocardial enhancement. 3. No significant valvular abnormalities.4. Mild left atrial enlargement. Normal right atrial size.5. Innumerable bilateral pulmonary metastases, better characterized on recent CTA chest dated 06/17/2020.6. Ascending aortic aneurysm measuring 4.1 cm in diameter.ECHO:  Results for orders placed or performed during the hospital encounter of 06/16/20 Echo 2D Ltd if Ind Image Enhancement and or 3D Result Value Ref Range  Reported Visual Range EF% 55-60 % Narrative   * Study limited by poor acoustic windows.* Suboptimal visualization of the left ventricle, but it appears to be normal size with normal function.  No regional wall motion abnormalities.  LVEF estimated by visual assessment was between 55-60%.* Suboptimal visualization of the right ventricle, but it appears to be dilated with mildly decreased function.* There is beat to beat variability in the LV and RV function due to frequent ventricular ectopy.* IVC diameter < 2.1 cm that collapses > 50% with a sniff suggests normal RAP (0-5 mmHg, mean 3 mmHg).* No significant pericardial effusion.* Compared with the prior study, dated 05/29/2020, there are changes noted.  The RV appears dilated with mildly reduced function. Results for orders placed or performed during the hospital encounter of 05/27/20 Echo 2D Ltd w Doppler and CFI if Ind Image Enhancement and or 3D Result Value Ref Range  Reported Biplane EF% 66 %  Narrative   * Limited study for left ventricular function.* No regional wall motion abnormalities.  Normal left ventricular systolic function.  LVEF calculated by biplane Simpson's was 66%.* Normal right ventricular systolic function.* No evidence of pericardial effusion.* Compared with the prior study, dated 05/11/2020, there is no significant change. Results for orders placed or performed during the hospital encounter of 05/09/20 Echo 2D Complete w Doppler and CFI if Ind Image Enhancement 3D and or bubbles Result Value Ref Range  Reported Biplane EF% 68 %  Narrative   * Normal left ventricular size, systolic function and wall motion. Moderate concentric left ventricular hypertrophy.  LVEF calculated by biplane Simpson's was 68%.  Abnormal tissue Doppler suggestive of abnormal diastolic function.  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  Previous study ascending aorta size was 4.4cm* Inferior vena cava was not well visualized.* Compared with the prior study, dated 02/08/2020, there are changes noted.  Wall motion in inferior and inferoseptal walls noted on current study,  though compared to prior image, no significant change in wall motion abnormality.  ASSESSMENT:69 year old gentleman with a complex medical history as described above.  He has CAD, status post multiple PCI, stage IV NSCLC, newly diagnosed invasive urothelial carcinoma.  Cancer determined to be metastatic bladder cancer??	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  Fortunately, he is without angina symptoms.  He continues on aspirin therapy.  He has been unable to tolerate dual anti-platelet therapy due to hematuria.?	Diastolic heart failure.  improved with substantial diuresis while hospitalized?	Discharge BP stable. Currently lisinopril and metoprolol are being held for hypotension, [place]:Continue Torsemide 20 mg dailyImportance of daily weights discussed and he states that he will have a family member purchased him a standing scale today.  I asked him to perform daily weights and to call me if he gains more than 2 pounds in 1 day or 5 pounds in 1 week.  Continue aspirin 81 mg dailyContinue Atorvastatin 80 mg dailyHold Lisinopril and Metoprolol for now. If BP is stable by VNA, will resume MetoprololNeeds repeat troponin and chemistries next weekContinue Prednisone 110 mg daily and taper by 10 mg per week as long as troponin continues downtrendContinue myocophenolate 1000 mg bidCardiac MRI in 4 to 6 weeks --ordered and sent message to MRI teamOV 11/12 with meI encouraged the patient to call me with any questions or any worsening or new symptoms.IDEO TELEHEALTH VISIT: This clinician is part of the telehealth program and is conducting this visit in a currently approved location. For this visit the clinician and patient were present via interactive audio & video telecommunications system that permits real-time communications.Patient consent given for video visit: YesState patient is located in: CTThe clinician is appropriately licensed in the above state to provide care for this visit.Other individuals present during the telehealth encounter and their role/relation: noneTotal time spent in medical video consultation (required if billing based on time): 20 minutesTotal time spent providing education, coordination of care/services, and counseling: 20 minutesBecause this visit was completed over video, a hands-on physical exam was not performed.  Patient understands and knows to call back if condition changes. The visit type for this patient required modifications due to the COVID-19 outbreak. Lorain Childes, APRN, [facility] [phone] Signed by Jeanelle Malling, APRN, June 24, 2020

## 2020-06-24 NOTE — Other
[facility]	Oncology Consult Progress NoteAttending Provider: Roger Kill, MD Subjective: Interim History: Chart and Nursing Note reviewed. No acute overnight events per chart. Fell yesterday while ambulating to bathroom, no LOC per significant event note, CTH with no acute IC abnormalities.  Patient was discussed and examined with Attending. Upon exam this AM, patient appears well in no acute distress, energy seems improved from prior. He has no new pain after fall. Shortness of breath stable. Swelling in legs improved. Currently, Afebrile, HD stable. Review of Allergies/Meds/Hx: Allergies notable for: No Known AllergiesMedications: Current Facility-Administered Medications Medication Dose Route Frequency Provider Last Rate Last Admin  aspirin EC delayed release tablet 81 mg  81 mg Oral Daily Moreines, Reynolds Bowl, MD   81 mg at 06/23/20 1610  calcium-vitamin D 500 mg-200 units tablet  1 tablet Oral Daily Tim Lair, MD   1 tablet at 06/23/20 0812  enoxaparin (LOVENOX) syringe 40 mg  40 mg Subcutaneous Q12H Burnett Corrente, PA   40 mg at 06/23/20 9604  escitalopram oxalate (LEXAPRO) tablet 10 mg  10 mg Oral Nightly Tim Lair, MD   10 mg at 06/22/20 2024  [Held by provider] furosemide (LASIX) injection 40 mg  40 mg Intravenous Q24H Roger Kill, MD      gabapentin (NEURONTIN) capsule 600 mg  600 mg Oral Nightly Moreines, Reynolds Bowl, MD   600 mg at 06/22/20 2024  insulin lispro (Admelog, HumaLOG) Sliding Scale (See admin instructions for dose)   Subcutaneous TID AC Moreines, Reynolds Bowl, MD   2 Units at 06/22/20 1834  insulin lispro (Admelog, HumaLOG) Sliding Scale (See admin instructions for dose)   Subcutaneous Nightly Ramos, Rey F, MD      ipratropium-albuteroL (DUO-NEB) 0.5 mg-3 mg(2.5 mg base)/3 mL nebulizer solution 3 mL  3 mL Nebulization Q6H WA Roger Kill, MD   3 mL at 06/23/20 0732  [Held by provider] lisinopriL (PRINIVIL,ZESTRIL) tablet 2.5 mg  2.5 mg Oral Daily Tim Lair, MD   2.5 mg at 06/21/20 5409  [Held by provider] metoprolol succinate (TOPROL-XL) 24 hr tablet 12.5 mg  12.5 mg Oral Daily Tim Lair, MD   12.5 mg at 06/21/20 0838  midodrine (PROAMATINE) tablet 2.5 mg  2.5 mg Oral TID WC Roger Kill, MD   2.5 mg at 06/23/20 8119  mycophenolate mofetil (CELLCEPT) tablet 1 g  1 g Oral BID Roger Kill, MD   1 g at 06/23/20 1478  nicotine (NICODERM CQ) transdermal patch 24 hr 21 mg  21 mg Transdermal Daily Tim Lair, MD   21 mg at 06/23/20 2956  oxybutynin XL (DITROPAN-XL) 24 hr tablet 5 mg  5 mg Oral Daily Tim Lair, MD   5 mg at 06/23/20 2130  oxyCODONE (OxyCONTIN) 12 hr tablet 10 mg  10 mg Oral Q12H Peri Jefferson, PA   10 mg at 06/23/20 0714  pantoprazole (PROTONIX) EC tablet 40 mg  40 mg Oral Daily Tim Lair, MD   40 mg at 06/23/20 8657  polyethylene glycol (MIRALAX) packet 17 g  17 g Oral Daily Verlee Rossetti, APRN   17 g at 06/23/20 8469  predniSONE (DELTASONE) tablet 100 mg  100 mg Oral Daily Peri Jefferson, PA   100 mg at 06/23/20 6295  rosuvastatin (CRESTOR) tablet 40 mg  40 mg Oral Daily Tim Lair, MD   40 mg at 06/23/20 2841  senna-docusate (SENNA-PLUS) 8.6-50 mg per tablet 1 tablet  1 tablet Oral BID Moreines, Reynolds Bowl,  MD   1 tablet at 06/23/20 0812  sodium chloride 0.9 % flush 3 mL  3 mL IV Push Q8H Moreines, Reynolds Bowl, MD   3 mL at 06/19/20 2053  sulfamethoxazole-trimethoprim (BACTRIM DS;CO-TRIMOXAZOLE DS) 800-160 mg per tablet 1 tablet  1 tablet Oral Once per day on Mon Wed Fri Moreines, Reynolds Bowl, MD   1 tablet at 06/22/20 0981  tamsulosin (FLOMAX) 24 hr capsule 0.4 mg  0.4 mg Oral Nightly Moreines, Reynolds Bowl, MD   0.4 mg at 06/22/20 2024 PRN: acetaminophen  650 mg Oral Q6H PRN  albuterol  2.5 mg Nebulization Q6H PRN  carboxymethylcellulose  1 drop Both Eyes TID PRN  dextrose 50% in water (D50W)  12.5 g IV Push Q15 MIN PRN  dextrose 50% in water (D50W)  25 g IV Push Q15 MIN PRN  Enteral Hypoglycemia Management if Blood Glucose 50 - 69 mg/dL or 70 - 79 mg/dL with symptoms of Hypoglycemia  1 each Oral Q15 MIN PRN  Enteral Hypoglycemia Management if Blood Glucose less than 50 mg/dL  2 each Oral X91 MIN PRN  glucagon  1 mg Intramuscular Once PRN  LORazepam  0.5 mg Oral Q6H PRN  nicotine (polacrilex)  2 mg Oral Q2H PRN  oxyCODONE  5 mg Oral Q3H PRN  polyethylene glycol  17 g Oral DAILY PRN  simethicone  80 mg Oral Q6H PRN  sodium chloride  3 mL IV Push PRN for Line Care Infusions:  Objective: Vitals:Last 24 hours: Temp:  [97.6 ?F (36.4 ?C)-97.8 ?F (36.6 ?C)] 97.8 ?F (36.6 ?C)Pulse:  [56-80] 56Resp:  [16-20] 16BP: (115-135)/(67-80) 123/76SpO2:  [94 %-99 %] 99 %I/O's:Gross Totals (Last 24 hours) at 06/23/2020 1204Last data filed at 06/22/2020 2131Intake 120 ml Output -- Net 120 ml Physical ExamConstitutional:     General: He is not in acute distress.HENT:    Head: Normocephalic and atraumatic. Eyes:    General: No scleral icterus.Cardiovascular:    Rate and Rhythm: Normal rate and regular rhythm. Pulmonary:    Effort: Pulmonary effort is normal. No respiratory distress.    Breath sounds: Normal breath sounds.    Comments: NC in placeAbdominal:    General: Bowel sounds are normal.    Palpations: Abdomen is soft.    Tenderness: There is no abdominal tenderness. Musculoskeletal:    Right lower leg: Edema present.    Left lower leg: Edema present. Skin:   Coloration: Skin is not jaundiced. Neurological:    Mental Status: He is alert. Labs:I have reviewed the patient's labs within the last 24 hrs.Lab Results Component Value Date  WBC 22.2 (H) 06/20/2020  HGB 11.0 (L) 06/20/2020  HCT 31.4 (L) 06/20/2020  MCV 97.5 (H) 06/20/2020  PLT 246 06/20/2020 Lab Results Component Value Date  CREATININE 1.68 (H) 06/23/2020  BUN 38 (H) 06/23/2020  NA 140 06/23/2020  K 4.2 06/23/2020  CL 101 06/23/2020  CO2 28 06/23/2020 Lab Results Component Value Date  ALT 33 06/17/2020  AST 21 06/17/2020  ALKPHOS 125 (H) 06/17/2020  BILITOT 0.3 06/17/2020 Micro: UCx 10/22: No Growth SARS CoV-2 (COVID-19) 10/21: Negative Diagnostic Review: CTH 10/27: IMPRESSION:No acute intracranial abnormality.MRI HEART 10/26: IMPRESSION:1. Normal left ventricular size. Normal left ventricular systolic function. LVEF: 60 %. Hypokinesis of the basal to mid inferoseptum and the basal to apical inferior wall, with associated thinning of the myocardium and subendocardial delayed enhancement. Findings compatible with ischemic cardiomyopathy. No findings suggestive of acute myocarditis.2. Normal right ventricular size. Normal right ventricular systolic function. RVEF: 60 %. No right  ventricular delayed myocardial enhancement. 3. No significant valvular abnormalities.4. Mild left atrial enlargement. Normal right atrial size.5. Innumerable bilateral pulmonary metastases, better characterized on recent CTA chest dated 06/17/2020.6. Ascending aortic aneurysm measuring 4.1 cm in diameter.ECHO 10/22:  * Study limited by poor acoustic windows.* Suboptimal visualization of the left ventricle, but it appears to be normal size with normal function.  No regional wall motion abnormalities.  LVEF estimated by visual assessment was between 55-60%.* Suboptimal visualization of the right ventricle, but it appears to be dilated with mildly decreased function.* There is beat to beat variability in the LV and RV function due to frequent ventricular ectopy.* IVC diameter < 2.1 cm that collapses > 50% with a sniff suggests normal RAP (0-5 mmHg, mean 3 mmHg).* No significant pericardial effusion.* Compared with the prior study, dated 05/29/2020, there are changes noted.  The RV appears dilated with mildly reduced function. CTA CHEST (PE) 10/22: IMPRESSION:1.  No evidence of pulmonary embolism, subject to limitations described above.2.  Interval enlargement of multiple lung metastases with stable thoracic lymphadenopathy, consistent with an overall progression of patient's metastatic disease. Continued follow-up per oncological protocols is recommended.3.  Main pulmonary artery is enlarged, nonspecific but may suggest underlying pulmonary artery hypertension. CXR 10/21:  IMPRESSION:Cardiomegaly and central vascular congestion with a suggested component of mild pulmonary edema, consistent with a fluid overload picture. Superposed infectious process would be difficult to exclude radiographically in this setting and should be correlated for clinically. Metastatic lung lesions are again noted, better assessed on prior Schlater scan of 05/17/2020. 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 (on steroid taper)), with recent admission 04/2020 for hematuria, s/p Cystoscopy with clot evacuation, TURBT, L retrograde pyelogram, and noted 3x4cm papillary tumor involving L trigone and L lateral bladder wall on 9/26 with path c/w Urothelial carcinoma, s/p repeat EBUS 9/28 with RLL FNA/Wash positive for malignant cells (similar morphologically to bladder pathology, therefore felt to be metastatic carcinoma from known urothelial primary), plan to start Enfortumab Vedotin) who presents with shortness of breath, CXR with cardiomegaly and central vascular congestion s/f fluid overload; CTA negative for PE but with interval enlargement of multiple lung mets, cardiology/cardio-onc consulted, currently on Pred and Cellcept. Plan: --appreciate Cardio-Onc and Cardiology involvement. Steroids per Cardio-onc. --plan was to initiated treatment for his metastatic Urothelial Carcinoma today as an outpatient, will reschedule to next week. --Consider PT and SW Consult. --Primary Oncologist: Dr Kathrynn Ducking (last updated 10/26)Thank you for involving [place] in the care of this patient. Recommendations were communicated with the primary team. Oncology will Continue to Follow. Electronically Signed:Holly Adinolfi, PA-C10/28/202112:04 PMI saw and examined the patient with PA Adinolfi and I agree with the history, physical assessment and plan.  Mr. Vosler was feeling better today.  He thinks his LE edema improved significantly and his breathing has improved. We will reschedule his chemotherapy to next week with Dr. Kathrynn Ducking.

## 2020-06-27 ENCOUNTER — Ambulatory Visit: Admit: 2020-06-27 | Payer: PRIVATE HEALTH INSURANCE | Attending: Internal Medicine

## 2020-06-27 DIAGNOSIS — F4321 Adjustment disorder with depressed mood: Secondary | ICD-10-CM

## 2020-06-27 DIAGNOSIS — M792 Neuralgia and neuritis, unspecified: Secondary | ICD-10-CM

## 2020-06-27 DIAGNOSIS — M7918 Myalgia, other site: Secondary | ICD-10-CM

## 2020-06-27 DIAGNOSIS — Z515 Encounter for palliative care: Secondary | ICD-10-CM

## 2020-06-27 NOTE — Progress Notes
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 gentleman with 75py smoking history, history of CAD s/p MIx2 and PCI, who has now transfered care from [place] to [place] for his stage IV NSCLC.  ?He noticed worsening DOE around November and December 2020.  Chest Xray done in 09/2019 showed bilateral pulmonary nodules and chest [place] done shortly after showed numerous bilateral nodules with dominant left lung nodule measuring 1.4cm, as well as bilateral axillary, hilar and mediastinal nodes.  PET [place] 11/04/19 showed hyperavid lung nodules as well as diffuse adenopathy in neck, chest and upper abdomen.  ?He underwent ultrasound guided biopsy of infraclavicular lymph node on 11/18/19, which showed focal atypical cells concerning for carcinoma.   He subsequently underwent EBUS with biopsy of station 7 node, which was positive for metastatic squamous cell carcinoma.  There was insufficient material for molecular testing or PD-L1 staining.  11/28/19-03/10/20: Carbo/Taxol/Pembro - received 2 cycles in [place] prior to transitioning care.Current Treatment:  Carbo/Taxol/Pembro q21 days - ON-HOLDInterim History: Paul Hunter presents today for telehealth follow-up. He was seen by Dr. Kathrynn Ducking for consultation of metastatic bladder cancer. Planned to start enfortumab vedotin next week. Was also seen by Ortho Spine, who did not recommend epidural injections at this time, as he was on systemic AC and high-dose steroids.He says he has good days and bad days both physically and emotionally. His leg edema is an ongoing issue, and more aggressive diuresis has been limited by multiple presyncopal episodes. He is now using a walker full-time due to the presyncopal episodes, one of which he required assistance standng up.  He does find his dyspnea is improved, though has some difficulty with the stairs. Review of Systems Constitutional: Positive for malaise/fatigue. Negative for chills, diaphoresis, fever and weight loss. Respiratory: Positive for cough. Negative for hemoptysis, sputum production and shortness of breath.  Cardiovascular: Positive for leg swelling. Negative for chest pain and palpitations. Gastrointestinal: Positive for constipation and heartburn. Negative for abdominal pain, blood in stool, diarrhea, melena, nausea and vomiting. Genitourinary: Negative for hematuria. Musculoskeletal: Positive for back pain. Negative for myalgias and neck pain. Neurological: Negative for dizziness and focal weakness. Psychiatric/Behavioral: Positive for depression. Allergies: Patient has no known allergies.Medications: Current Outpatient Medications Medication Sig ? albuterol sulfate 90 mcg/actuation HFA aerosol inhaler Inhale 2 puffs into the lungs every 6 (six) hours as needed for wheezing or shortness of breath. ? aspirin 81 MG EC tablet Take 81 mg by mouth daily. ? atorvastatin (LIPITOR) 80 mg tablet Take 1 tablet (80 mg total) by mouth nightly. ? belladonna alkaloids-opium (B&O SUPPRETTES) 16.2-30 mg suppository Place 1 suppository rectally 2 (two) times daily as needed for bladder spasm. (Patient not taking: Reported on 06/09/2020) ? calcium carbonate-vitamin D3 500 mg(1,250mg ) -400 unit per tablet Take 1 tablet by mouth 2 (two) times daily. ? carboxymethylcellulose (REFRESH PLUS) 0.5 % ophthalmic solution in dropperette Place 1 drop into both eyes 3 (three) times daily as needed. ? escitalopram oxalate (LEXAPRO) 10 mg tablet Take 1 tablet (10 mg total) by mouth nightly. ? fexofenadine (ALLEGRA) 180 mg tablet Take 180 mg by mouth daily as needed.  ? furosemide (LASIX) 40 mg tablet Take 0.5 tablets (20 mg total) by mouth daily. ? gabapentin (NEURONTIN) 300 mg capsule Take 2 capsules (600 mg total) by mouth nightly. ? lisinopriL (PRINIVIL,ZESTRIL) 2.5 mg tablet Take 2.5 mg  by mouth daily. ? LORazepam (ATIVAN) 0.5 mg tablet Take 1 tablet (0.5 mg total) by mouth every 6 (six) hours as needed for anxiety. ? metoprolol succinate XL (TOPROL-XL) 25 mg 24 hr tablet Take 0.5 tablets (12.5 mg total) by mouth daily. Take with or immediately following a meal. ? nicotine (NICODERM CQ) 21 mg transdermal patch APPLY 1 PATCH DAILY. ROTATE PATCH SITES. LEAVE ON FOR 24 HOURS UNLESS OTHERWISE DIRECTED. ? nicotine polacrilex (COMMIT) 2 mg lozenge Place 1 lozenge (2 mg total) inside cheek every 2 (two) hours as needed for smoking cessation. (Patient not taking: Reported on 06/09/2020) ? oxybutynin XL (DITROPAN-XL) 5 mg 24 hr tablet Take 1 tablet (5 mg total) by mouth daily. ? oxyCODONE (OXYCONTIN) 10 mg 12 hr extended release tablet Take 1 tablet (10 mg total) by mouth every 12 (twelve) hours. ? oxyCODONE (ROXICODONE) 5 mg Immediate Release tablet Take 1 tablet (5 mg total) by mouth every 3 (three) hours as needed for up to 10 doses. ? pantoprazole (PROTONIX) 40 mg tablet Take 1 tablet (40 mg total) by mouth daily. ? polyethylene glycol (MIRALAX) 17 gram packet Take 1 packet (17 g total) by mouth daily as needed for constipation. Mix in 8 ounces of water, juice, soda, coffee or tea prior to taking. ? [START ON 08/09/2020] predniSONE (DELTASONE) 10 mg tablet Take 3 tablets (30 mg total) by mouth daily for 7 days, THEN 2 tablets (20 mg total) daily for 7 days, THEN 1 tablet (10 mg total) daily for 7 days, THEN 0.5 tablets (5 mg total) daily for 7 days, THEN 0.5 tablets (5 mg total) daily for 7 days. Take with food.. ? predniSONE (DELTASONE) 10 mg tablet Take 13 tablets (130 mg total) by mouth daily for 7 days, THEN 12 tablets (120 mg total) daily for 7 days, THEN 11 tablets (110 mg total) daily for 7 days, THEN 10 tablets (100 mg total) daily for 7 days, THEN 9 tablets (90 mg total) daily for 7 days, THEN 8 tablets (80 mg total) daily for 7 days, THEN 7 tablets (70 mg total) daily for 7 days, THEN 6 tablets (60 mg total) daily for 7 days, THEN 5 tablets (50 mg total) daily for 7 days, THEN 4 tablets (40 mg total) daily for 7 days. Take with food.. ? predniSONE (DELTASONE) 50 mg tablet Take with food. ? senna-docusate (SENNA-PLUS) 8.6-50 mg tablet Take 1 tablet by mouth nightly. ? simethicone (MYLICON) 80 mg chewable tablet Take 1 tablet (80 mg total) by mouth every 6 (six) hours as needed for flatulence. ? sulfamethoxazole-trimethoprim (BACTRIM DS;CO-TRIMOXAZOLE DS) 800-160 mg per tablet Take 1 tablet by mouth Every Monday, Wednesday, and Friday. ? tamsulosin (FLOMAX) 0.4 mg 24 hr capsule Take 1 capsule (0.4 mg total) by mouth nightly. ? walker Misc Use as directed. No current facility-administered medications for this visit. Exam: ECOG Performance Status: 2None performed due to the nature of the visitLaboratory Data: Results for orders placed or performed in visit on 06/03/20 EKG (Clinic Performed) Result Value Ref Range  ECG - HEART RATE 81 bpm  ECG - QRS Interval 97 ms  ECG - QT Interval 373 ms  ECG - QTC Interval 434 ms  ECG - P Axis 41 deg  ECG - QRS Axis 5 deg  ECG - T Wave Axis 44 deg  ECG -- P-R Interval 155 msec  ECG - SEVERITY Borderline ECG severity  Diagnostic Imaging: No results found. Impression: Paul Hunter is a 69 y.o. man with a 75py  smoking history, history of CAD s/p MIx2 and PCI, diagnosed with stage IV NSCLC in the fall of 2020. ?He was started on treatment with Carbo/Taxol/Pembrolizumab on 12/25/19 in [place] where he received?2 cycles. He tolerated treatment?relatively well overall, but developed some neuropathy following?the?second cycle. His care was then transitioned to [place].?Taxol was dose reduced by 20% starting with cycle 3 due to neuropathy. He completed cycle 4 on 7/15. Restaging imaging on 7/31 showed stable/improving disease, including improvements in his RP LAD. 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  Trop and BNP normal. Started on prednisone 130mg  daily. PET stress with findings suspicious for multi-vessel CAD, resting EF 40%-->stress 36%.  He was therefore started on DAPT, but developed recurrent gross hematuria requiring admissions. Cystoscopy with TURBT on 05/22/2020 revealed a bladder mass, with path consistent with urothelial carcinoma.In further discussions with Pathology and re-review of his outside slides, his initial mediastinal node biopsy appears poorly differentiated and p63+. While initially interpreted as metastatic SCC of presumed pulmonary origin, the morphological similarities to most recent bladder mass biopsy make it likely he has had metastatic urothelial carcinoma (rather than lung cancer), which can also be p63+. He has since been seen [facility] by Dr. Kathrynn Ducking, and is planned to start enfortumab vedotin next week. He continued to have elevated troponins when last checked 2 weeks ago, and therefore remains on high-dose prednisone. We would also advocate for a trial of steroid-sparing agent given the persistence of symptoms and degree of steroid-related side effects he is expereincing. His BNP was also significantly elevated, though aggressive diuresis has been limited by hypotension. Plan: #Metastatic urothelial carcinoma:- Being followed by Dr. 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 Dotty Gonzalo.___________________Benjamin Jeannie Fend. Hunter, MDClinical Fellow, PGY-6Division of Medical OncologySmilow Cancer HospitalAttending Statement I have seen/evaluated the patient and agree with the H&P as documented by the Fellow Dr. Reynolds Bowl.  Paul Hunter continues to have dyspnea on exertion, overall improved from prior.  Diuresis has been limited by low BPs.  He is now being followed by Dr. Isabell Jarvis with the plan to start enfortumab vedotin in the near future.  He remains on high-dose prednisone for myocarditis with persistently elevated troponin.  We offered to remain involved in his care however he would like despite him not having a thoracic malignancy.  We agree with considering a steroid-sparing agent if troponins remain elevated given the prolonged, high-dose prednisone.Louanne Skye TELEHEALTH VISIT: This clinician is part of the telehealth program and is conducting this visit in a currently approved location. For this visit the clinician and patient were present via interactive audio & video telecommunications system that permits real-time communications. Consent was signed via the Patient Acknowledgement  and Financial Authorization Form and the Ambulatory Telehealth Consent Form. State patient is located in: [place] clinician is appropriately licensed in the above state to provide care for this visit. Other individuals present during the telehealth encounter and their role/relation: noneIf billing based on time, please complete (Not required if billing based on MDM):                           Total time spent in medical video consultation: 15 minutes; Total time spent by the provider on the day of service, which includes time spent on chart review, medical video consultation, education, coordination of care/services and counseling: 20 minutes Because this visit was completed over video, a hands-on physical exam was not performed.  Patient/parent or guardian understands and knows to call back if condition changes.

## 2020-06-27 NOTE — Discharge Summary
[facility]    Med/Surg Discharge Summary    Patient Data:    Patient Name: Paul Hunter Admit date: 05/21/2020   Age: 69 y.o. Discharge date: 05/25/2020   DOB: March 12, 1951  Discharge Attending Physician: Camila Li, MD    MRN: [id number]  Discharged Condition: stable   PCP: Pcp, No Disposition: Home      Principal Diagnosis: hematuria  Secondary Diagnoses:    NSCLC    Issues to be Addressed Post Discharge:     Issues to be Addressed Post Discharge:  1. F/u urine culture (of note was sent via Foley overnight 9/28-9/29) so consider treating if symptomatic.   2. Monitor Creatinine closely. Consider Renal US if uptrending.   3. Continue to hold Plavix for now, TBD by outpatient team if plan to resume.   4. Follow up Path from Bronch and Path from bladder biopsy.   5. Dr. Layla Barter to resume oncologic care as below.   6. Follow up with Urology as below.   7. Follow up with Cardio-Onc as below.   8. Encourage smoking cessation.     Relevant Medications on Discharge:  Other: Bactrim ppx while on steroids    -Pred taper as below.     .    Pending Labs and Tests:   Pending Lab Results     Order Current Status    Surgical case     [facility]) In process          Follow-up Information:  [facility]  [address]  4th [address]  [place] [address]  [phone]  On 06/02/2020  Please arrive at 9:00AM on 10/7 for follow up appointment with Dr. Layla Barter. Please get your labs drawn prior to your appointment.     Jeanelle Malling., APRN  [address] [facility] [address]  [address]  [place] [address]  [phone]    On 06/03/2020  Please arrive at 8:00AM on 10/8 for follow up appointment with Cardiology-Oncology APRN [facility].     Devito, Daralene Milch., MD  [address]  [address]  [place] [address]  [phone]    On 06/15/2020  Please arrive at 7:45AM on 10/20 for follow up appointment with Dr. Meryl Dare. The Urology team is working on arranging for a sooner follow up appointment and will contact you with an updated date/time.        Future Appointments   Date Time Provider Department Center   05/27/2020  8:00 AM Abe People, MD [facility] CAD   06/02/2020  9:00 AM Nikki Dom, MD SM THOR [facility] [place] M   06/03/2020 11:00 AM Jeanelle Malling., APRN SM[facility] CARD ON [facility] M   06/08/2020  8:00 AM Weston Brass, DO [facility] Rad   06/15/2020  7:45 AM Devito, Daralene Milch., MD Jerl Santos [facility] CAD   06/21/2020 12:00 PM [facility] MR MRC 2 [facility] MRI [facility] Rad       Hospital Course:     Hospital Course:     69yo male with PMH of CAD (s/p MI x2, s/p PCI x4), HFrEF, emphysema, 75 pack year smoking history, OSA (on CPAP), hematuria (intermittent for ~74yr w/ neg work-up, last in 2019) spinal stenosis, depression, and stage IV NSCLC (initially presented w/ DOE 06/2019 w/ care initially in [place] including PET 11/04/19 w/ hyperavid lung nodules w/ diffuse adenopathy in neck/chest and upper abdomen, s/p infraclavicular LN bx  11/18/19 w/ focal atypical cells c/f carcinoma, s/p EBUS w/ bx of station 7 LN c/w metastatic SCC w/ insufficient material for molecular testing or PD-L1 staining, started Carbo/Taxol/Pembro 11/28/19 in [place] and then transitioned care to [facility], most recently s/p C4 03/10/20 with plan to transition to pembro maintenance given disease response but therapy deferred given ongoing cardiac work-up) 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 (mix of ischemic and nonischemic DGE w/ LVEF 44% +WMA; started on pred 1mg /kg/day f/b weekly taper, w/ stress PET 05/06/20 that was high risk w/ large reversible perfusion defects and LVEF drop from 40-36% on stress and thus cautiously started on plavix) and intermittent gross hematuria (evaluated by Urology as outpatient w/ plan for cystoscopy) with multiple recent admits: admitted 9/13-9/16 after noted wt gain, pitting edema and worsening DOE c/f acute decompensated HF w/ TTE c/w diastolic dysfunction exacerbation; he was diuresed, started on metop and discharged on lasix 40mg  PO QD and metop succinate 12.5mg  daily w/ plan for outpatient Cystoscopy for ongoing hematuria. ECC visit 9/18 w/ recurrent hematuria and urinary retention; he was seen by Urology, manually irrigated w/ clearance of urine, started on pyridium and sent home w/ foley catheter. Again admitted 9/23-9/24 on Urology service after p/w urinary retention, penile pain and poor foley drainage; he underwent bedside irrigation and given urine was clear red without clots and Hgb and Cr at baseline, no operative intervention was recommended but planned to have cystoscopy as outpatient and Cardiology recommended holding plavix until outpatient f/u 06/03/20. He now presents for urinary retention and pain.     In the ECC, foley was flushed w/ evacuation of clots and improvement in symptoms, Urology was consulted, he was given CTX and admitted for further management.    He underwent cystoscopy w/ clot evacuation, TURBT, L retrograde pyelogram, and noted 3x4cm papillary tumor involving L trigone and L lateral bladder wall on 9/26. Post-op he developed gross hematuria and thus underwent repeat cystoscopy, clot evacuation and fulgration 9/27 and found to have large organized clot w/in bladder and arterial bleeding immediately lateral to L ureteral orifice s/p cauterization w/ subsequent improvement in hematuria. Throughout, he was maintained on CBI until it was clamped on 9/28. Foley was removed on 9/29AM and patient voided shortly after without difficulty and negative PVR x2.  Pathology was still in process at time of discharge.     Per discussion with Dr Layla Barter, Pulm was consulted for EBUS to confirm that [facility] was truly lung primary and not a metastasis for bladder. He underwent EBUS on 9/28 and pathology was still in process at time of discharge.     He was started on nicotine patches/lozenge/gum for smoking cessation and was encouraged to continue upon discharge. Smoking cessation encouraged though unfortunately patient reports not willing to quit smoking at this time.     He was discharged on 9/29 to home with follow-up as below. He was encouraged to contact clinic with any questions or concerns and verbalized understanding.     Inpatient Consultants and summary of recommendations: as above       Pertinent Procedures or Surgeries:   Surgical and Procedural Summary this Admission     Past Procedures (05/21/2020 to Today)     Date Procedures Providers Location    05/22/2020 CYSTOSCOPY, CLOT EVACUATION, , TURBT, left retrograde pyelogram, left ureteroscopy , left ureteral tumor bx [place]    05/23/2020 CYSTOSCOPY, CLOT EVACUATION,FULGURATION [place]. [facility]  05/24/2020 EBUS [facility]                  Pertinent lab findings and test results:   Objective:     Recent Labs   Lab 05/23/20  0246 05/24/20  0529 05/25/20  0356   WBC 12.3* 12.5* 15.9*   HGB 10.6* 10.0* 10.7*   HCT 31.7* 30.4* 32.6*   PLT 207 240 266    Recent Labs   Lab 05/21/20  1835 05/22/20  0509 05/23/20  0246   NEUTROPHILS 92.3* 81.2 81.0      Recent Labs   Lab 05/21/20  1835 05/22/20  0509 05/22/20  0509 05/23/20  0246 05/23/20  0756   NA 139 141  --  139  --    K 3.9 4.2  --  4.3  --    CL 102 106  --  105  --    CO2 24 25  --  26  --    BUN 26* 27*  --  25*  --    CREATININE 1.43* 1.42*  --  1.37*  --    GLU 147* 131*   < > 132* 105*   ANIONGAP 13 10  --  8  --     < > = values in this interval not displayed.    Recent Labs   Lab 05/21/20  1835 05/22/20  0509 05/23/20  0246   CALCIUM 8.3* 8.4* 8.1*      Recent Labs   Lab 05/21/20  1835 05/23/20  0246   ALT 44 34   AST 18 16   ALKPHOS 68 66   BILITOT 0.2 0.2    Recent Labs   Lab 05/21/20  1922   PTT 23.1   LABPROT 9.9   INR 0.90        Culture Information:  Recent Labs   Lab 05/21/20  1918   LABURIN No Growth       Imaging:   Imaging results last 24h: No results found.    Diet:  Heart healthy diet  Mobility: Highest Level of mobility - ACTUAL: Mobility Level 6, Walk 10+ steps,AM PAC 18-21          Physical Exam     Discharge vitals:   Temp:  [97.3 ?F (36.3 ?C)-98.5 ?F (36.9 ?C)] 98.4 ?F (36.9 ?C)  Pulse:  [55-92] 92  Resp:  [10-20] 20  BP: (101-201)/(61-109) 101/61  SpO2:  [93 %-99 %] 94 %  Device (Oxygen Therapy): room air  O2 Flow (L/min):  [2-10] 2   Pertinent Findings of Physical Exam: Unremarkable  Cognitive Status at Discharge: Alert and Oriented x 3    Discharge Physical Exam:  Physical Exam  Constitutional:       General: He is not in acute distress.  Cardiovascular:      Rate and Rhythm: Normal rate and regular rhythm.      Heart sounds: Normal heart sounds. No murmur heard.     Pulmonary:      Effort: No respiratory distress.      Breath sounds: Normal breath sounds. No wheezing, rhonchi or rales.   Abdominal:      General: Bowel sounds are normal. There is no distension.      Palpations: Abdomen is soft.      Tenderness: There is no abdominal tenderness.   Musculoskeletal:      Right lower leg: No edema.      Left  lower leg: No edema.   Skin:     Comments: Port clean/dry/intact    Neurological:      Mental Status: He is alert and oriented to person, place, and time.   Psychiatric:         Mood and Affect: Mood normal.         Behavior: Behavior normal.         Allergies   No Known Allergies     PMH PSH   Past Medical History:   Diagnosis Date   ? Anxiety    ? CAD (coronary artery disease)    ? Diabetes mellitus (HC Code)    ? Difficult intubation 05/22/2020    Patient with significant soft tissue in oropharynx. Glidescope intubation revealed anterior cords with floppy soft tissue obstruction. Patient will require video laryngoscopy for airway management going forward.   ? GERD (gastroesophageal reflux disease)    ? Hyperlipidemia    ? Hypertension    ? Hypertrophy of prostate without urinary obstruction and other lower urinary tract symptoms (LUTS)    ? Other testicular hypofunction    ? Spinal stenosis 09/28/2018   ? Tobacco abuse    ? Vitamin D deficiency       Past Surgical History:   Procedure Laterality Date   ? CAROTID STENT  08/28/2003   ? CHOLECYSTECTOMY  11/07/2016   ? KNEE SURGERY      torn meniscus   ? LAMINECTOMY  10/07/2008   ? LUMBAR DISC SURGERY      herniated disc        Social History Family History   Social History     Tobacco Use   ? Smoking status: Current Every Day Smoker     Types: Cigarettes   Substance Use Topics   ? Alcohol use: Not on file      History reviewed. No pertinent family history.       Discharge Medications:   Discharge:   Current Discharge Medication List      START taking these medications    Details   belladonna alkaloids-opium (B&O SUPPRETTES) 16.2-30 mg suppository Place 1 suppository rectally 2 (two) times daily as needed for bladder spasm.  Qty: 12 suppository, Refills: 0  Start date: 05/25/2020    Associated Diagnoses: Pulmonary emphysema, unspecified emphysema type (HC Code) (HC CODE)      famotidine (PEPCID) 20 mg tablet Take 1 tablet (20 mg total) by mouth daily.  Qty: 30 tablet, Refills: 0  Start date: 05/26/2020    Associated Diagnoses: Chronic kidney disease, unspecified CKD stage      LORazepam (ATIVAN) 0.5 mg tablet Take 1 tablet (0.5 mg total) by mouth every 6 (six) hours as needed for anxiety.  Qty: 15 tablet, Refills: 0  Start date: 05/25/2020    Associated Diagnoses: Chronic kidney disease, unspecified CKD stage      nicotine (NICODERM CQ) 7 mg transdermal patch Place 1 patch (7 mg total) onto the skin daily. Alternate arms/sites when applying patch. Discard old patch when applying new one  Qty: 28 patch, Refills: 0  Start date: 05/25/2020    Associated Diagnoses: Cigarette nicotine dependence without complication      polyethylene glycol (MIRALAX) 17 gram packet Take 1 packet (17 g total) by mouth daily as needed for constipation. Mix in 8 ounces of water, juice, soda, coffee or tea prior to taking.  Qty: 14 each, Refills: 0  Start date: 05/25/2020    Associated Diagnoses: Chronic kidney disease, unspecified  CKD stage      senna-docusate (SENNA-PLUS) 8.6-50 mg tablet Take 1 tablet by mouth nightly.  Qty: 30 tablet, Refills: 0  Start date: 05/25/2020    Associated Diagnoses: Constipation, unspecified constipation type      simethicone (MYLICON) 80 mg chewable tablet Take 1 tablet (80 mg total) by mouth every 6 (six) hours as needed for flatulence.  Qty: 30 tablet, Refills: 0  Start date: 05/25/2020    Associated Diagnoses: Chronic kidney disease, unspecified CKD stage      sulfamethoxazole-trimethoprim (BACTRIM DS;CO-TRIMOXAZOLE DS) 800-160 mg per tablet Take 1 tablet by mouth Every Monday, Wednesday, and Friday.  Qty: 28 tablet, Refills: 0  Start date: 05/27/2020    Associated Diagnoses: Chronic kidney disease, unspecified CKD stage         CONTINUE these medications which have CHANGED    Details   albuterol sulfate 90 mcg/actuation HFA aerosol inhaler Inhale 2 puffs into the lungs every 6 (six) hours as needed for wheezing or shortness of breath.  Qty: 6.7 g, Refills: 0  Start date: 05/25/2020      gabapentin (NEURONTIN) 300 mg capsule Take 2 capsules (600 mg total) by mouth nightly.  Qty: 60 capsule, Refills: 0  Start date: 05/25/2020    Associated Diagnoses: Lung nodule         CONTINUE these medications which have NOT CHANGED    Details   aspirin 81 MG EC tablet Take 81 mg by mouth daily.      atorvastatin (LIPITOR) 80 mg tablet Take 1 tablet (80 mg total) by mouth nightly.  Qty: 30 tablet, Refills: 6      carboxymethylcellulose (REFRESH PLUS) 0.5 % ophthalmic solution in dropperette Place 1 drop into both eyes 3 (three) times daily as needed.      escitalopram oxalate (LEXAPRO) 10 mg tablet Take 1 tablet (10 mg total) by mouth nightly.  Qty: 90 tablet, Refills: 3      furosemide (LASIX) 40 mg tablet Take 1 tablet (40 mg total) by mouth daily.  Qty: 30 tablet, Refills: 2      metoprolol succinate XL (TOPROL-XL) 25 mg 24 hr tablet Take 0.5 tablets (12.5 mg total) by mouth daily. Take with or immediately following a meal.  Qty: 30 tablet, Refills: 0      multivitamin capsule Take 1 capsule by mouth daily.       oxybutynin XL (DITROPAN-XL) 5 mg 24 hr tablet Take 1 tablet (5 mg total) by mouth daily.  Qty: 90 tablet, Refills: 0      oxyCODONE (ROXICODONE) 5 mg Immediate Release tablet Take 1 tablet (5 mg total) by mouth every 4 (four) hours as needed for up to 5 doses.  Qty: 5 tablet, Refills: 0      !! predniSONE (DELTASONE) 10 mg tablet Take 13 tablets (130 mg total) by mouth daily for 7 days, THEN 12 tablets (120 mg total) daily for 7 days, THEN 11 tablets (110 mg total) daily for 7 days, THEN 10 tablets (100 mg total) daily for 7 days, THEN 9 tablets (90 mg total) daily for 7 days, THEN 8 tablets (80 mg total) daily for 7 days, THEN 7 tablets (70 mg total) daily for 7 days, THEN 6 tablets (60 mg total) daily for 7 days, THEN 5 tablets (50 mg total) daily for 7 days, THEN 4 tablets (40 mg total) daily for 7 days. Take with food.Rob Bunting: 595 tablet, Refills: 0      !! predniSONE (DELTASONE) 10  mg tablet Take 3 tablets (30 mg total) by mouth daily for 7 days, THEN 2 tablets (20 mg total) daily for 7 days, THEN 1 tablet (10 mg total) daily for 7 days, THEN 0.5 tablets (5 mg total) daily for 7 days, THEN 0.5 tablets (5 mg total) daily for 7 days. Take with food.Rob Bunting: 49 tablet, Refills: 0  Start date: 06/28/2020, End date: 08/02/2020      fexofenadine (ALLEGRA) 180 mg tablet Take 180 mg by mouth daily as needed.        !! - Potential duplicate medications found. Please discuss with provider.          There was at total of approximately 45 minutes spent on the discharge of this patient    Electronically Signed:  Sharren Bridge, NP   05/25/2020   2:55 PM  ..Marland KitchenElectronically Signed by Girtha Rm, NP, May 25, 2020

## 2020-06-28 ENCOUNTER — Emergency Department: Admit: 2020-06-28 | Payer: PRIVATE HEALTH INSURANCE

## 2020-06-28 ENCOUNTER — Emergency Department: Admit: 2020-06-28 | Payer: PRIVATE HEALTH INSURANCE | Attending: Diagnostic Radiology

## 2020-06-28 ENCOUNTER — Inpatient Hospital Stay
Admit: 2020-06-28 | Discharge: 2020-07-06 | Payer: PRIVATE HEALTH INSURANCE | Source: Home / Self Care | Admitting: Internal Medicine

## 2020-06-28 ENCOUNTER — Encounter: Admit: 2020-06-28 | Payer: PRIVATE HEALTH INSURANCE | Attending: Medical Oncology

## 2020-06-28 ENCOUNTER — Ambulatory Visit: Admit: 2020-06-28 | Payer: PRIVATE HEALTH INSURANCE

## 2020-06-28 ENCOUNTER — Telehealth: Admit: 2020-06-28 | Payer: PRIVATE HEALTH INSURANCE | Attending: Adult Health

## 2020-06-28 ENCOUNTER — Encounter: Admit: 2020-06-28 | Payer: PRIVATE HEALTH INSURANCE | Attending: Vascular and Interventional Radiology

## 2020-06-28 DIAGNOSIS — C3490 Malignant neoplasm of unspecified part of unspecified bronchus or lung: Secondary | ICD-10-CM

## 2020-06-28 LAB — PROCALCITONIN     (BH GH LMW Q YH): BKR PROCALCITONIN: 0.3 ng/mL — ABNORMAL HIGH

## 2020-06-28 LAB — TROPONIN T     (Q)
BKR TROPONIN T: 0.04 ng/mL — ABNORMAL HIGH (ref <0.01)
BKR TROPONIN T: 0.05 ng/mL — ABNORMAL HIGH (ref <0.01)

## 2020-06-28 LAB — CBC WITH AUTO DIFFERENTIAL
BKR WAM ABSOLUTE IMMATURE GRANULOCYTES: 1.3 x 1000/??L — ABNORMAL HIGH (ref 0.0–0.3)
BKR WAM ABSOLUTE LYMPHOCYTE COUNT: 0.8 x 1000/ÂµL — ABNORMAL LOW (ref 1.0–4.0)
BKR WAM ABSOLUTE NRBC: 0 x 1000/??L (ref 0.0–0.0)
BKR WAM ANALYZER ANC: 33.7 x 1000/ÂµL — ABNORMAL HIGH (ref 1.0–11.0)
BKR WAM BASOPHIL ABSOLUTE COUNT: 0.1 x 1000/??L — ABNORMAL HIGH (ref 0.0–0.0)
BKR WAM BASOPHILS: 0.3 % (ref 0.0–4.0)
BKR WAM EOSINOPHIL ABSOLUTE COUNT: 0 x 1000/??L (ref 0.0–1.0)
BKR WAM EOSINOPHILS: 0 % (ref 0.0–7.0)
BKR WAM HEMATOCRIT: 31.4 % — ABNORMAL LOW (ref 37.0–52.0)
BKR WAM HEMOGLOBIN: 10.5 g/dL — ABNORMAL LOW (ref 12.0–18.0)
BKR WAM IMMATURE GRANULOCYTES: 3.5 % — ABNORMAL HIGH (ref 0.0–3.0)
BKR WAM LYMPHOCYTES: 2.3 % — ABNORMAL LOW (ref 8.0–49.0)
BKR WAM MCH (PG): 31.3 pg — ABNORMAL HIGH (ref 27.0–31.0)
BKR WAM MCHC: 33.4 g/dL (ref 31.0–36.0)
BKR WAM MCV: 93.7 fL (ref 78.0–94.0)
BKR WAM MONOCYTE ABSOLUTE COUNT: 0.5 x 1000/??L (ref 0.0–2.0)
BKR WAM MONOCYTES: 1.4 % — ABNORMAL LOW (ref 4.0–15.0)
BKR WAM MPV: 10.1 fL (ref 6.0–11.0)
BKR WAM NUCLEATED RED BLOOD CELLS: 0 % (ref 0.0–1.0)
BKR WAM PLATELETS: 231 x1000/??L — ABNORMAL HIGH (ref 140–440)
BKR WAM RDW-CV: 15 % — ABNORMAL HIGH (ref 11.5–14.5)
BKR WAM RED BLOOD CELL COUNT: 3.4 M/??L — ABNORMAL LOW (ref 3.8–5.9)
BKR WAM WHITE BLOOD CELL COUNT: 36.4 x1000/??L — ABNORMAL HIGH (ref 4.0–10.0)

## 2020-06-28 LAB — BASIC METABOLIC PANEL
BKR ANION GAP: 15 % — ABNORMAL HIGH (ref 7–17)
BKR BLOOD UREA NITROGEN: 44 mg/dL — ABNORMAL HIGH (ref 8–23)
BKR BUN / CREAT RATIO: 18.6 (ref 8.0–23.0)
BKR CALCIUM: 8.5 mg/dL — ABNORMAL LOW (ref 8.8–10.2)
BKR CHLORIDE: 98 mmol/L (ref 98–107)
BKR CO2: 24 mmol/L (ref 20–30)
BKR CREATININE: 2.36 mg/dL — ABNORMAL HIGH (ref 0.40–1.30)
BKR EGFR (AFR AMER): 34 mL/min/{1.73_m2} (ref >60)
BKR EGFR (NON AFRICAN AMERICAN): 28 mL/min/{1.73_m2} (ref >60)
BKR GLUCOSE: 148 mg/dL — ABNORMAL HIGH (ref 70–100)
BKR POTASSIUM: 4.4 mmol/L — ABNORMAL LOW (ref 3.3–5.3)
BKR SODIUM: 137 mmol/L — ABNORMAL LOW (ref 136–144)

## 2020-06-28 LAB — NT-PROBNPE: BKR B-TYPE NATRIURETIC PEPTIDE, PRO (PROBNP): 912 pg/mL — ABNORMAL HIGH (ref <125.0)

## 2020-06-28 LAB — PROTIME AND INR
BKR INR: 1 (ref 0.87–1.14)
BKR PROTHROMBIN TIME: 10.9 seconds (ref 9.6–12.3)

## 2020-06-28 LAB — SARS COV-2 (COVID-19) RNA-~~LOC~~ LABS (BH GH LMW YH)
BKR SARS-COV-2 RNA (COVID-19) (YH): NEGATIVE
BKR WAM NEUTROPHILS: NEGATIVE % — ABNORMAL HIGH (ref 37.0–84.0)

## 2020-06-28 MED ORDER — DOXYCYCLINE TAB/CAP 100 MG (WRAPPED E-RX)
100 mg | Freq: Two times a day (BID) | ORAL | Status: DC
Start: 2020-06-28 — End: 2020-07-01
  Administered 2020-06-29 (×2): 100 mg via ORAL

## 2020-06-28 MED ORDER — SIMETHICONE 80 MG CHEWABLE TABLET
80 mg | Freq: Four times a day (QID) | ORAL | 1 refills | Status: DC | PRN
Start: 2020-06-28 — End: 2020-07-07

## 2020-06-28 MED ORDER — SODIUM CHLORIDE 0.9 % (FLUSH) INJECTION SYRINGE
0.9 % | Freq: Three times a day (TID) | INTRAVENOUS | Status: DC
Start: 2020-06-28 — End: 2020-07-07
  Administered 2020-06-29 – 2020-07-04 (×4): 0.9 mL via INTRAVENOUS

## 2020-06-28 MED ORDER — VANCOMYCIN MAR LEVEL
Freq: Once | INTRAVENOUS | Status: DC
Start: 2020-06-28 — End: 2020-06-29

## 2020-06-28 MED ORDER — ROSUVASTATIN 40 MG TABLET
40 mg | Freq: Every day | ORAL | 7 refills | Status: DC
Start: 2020-06-28 — End: 2020-07-07
  Administered 2020-06-29 – 2020-07-05 (×7): 40 mg via ORAL

## 2020-06-28 MED ORDER — VANCOMYCIN IVPB (2 G IN 500ML NS)
INTRAVENOUS | Status: DC
Start: 2020-06-28 — End: 2020-06-28

## 2020-06-28 MED ORDER — ACETAMINOPHEN 325 MG TABLET
325 mg | Freq: Four times a day (QID) | ORAL | Status: DC | PRN
Start: 2020-06-28 — End: 2020-07-07
  Administered 2020-06-29 – 2020-06-30 (×3): 325 mg via ORAL

## 2020-06-28 MED ORDER — OXYCODONE ER 10 MG TABLET,CRUSH RESISTANT,EXTENDED RELEASE 12 HR
10 mg | Freq: Two times a day (BID) | ORAL | Status: DC
Start: 2020-06-28 — End: 2020-07-07
  Administered 2020-06-28 – 2020-07-04 (×12): 10 mg via ORAL

## 2020-06-28 MED ORDER — DOXYCYCLINE TAB/CAP 100 MG (WRAPPED E-RX)
100 mg | Freq: Once | ORAL | Status: CP
Start: 2020-06-28 — End: ?
  Administered 2020-06-28: 18:00:00 100 mg via ORAL

## 2020-06-28 MED ORDER — SODIUM CHLORIDE 0.9 % (FLUSH) INJECTION SYRINGE
0.9 % | INTRAVENOUS | Status: DC | PRN
Start: 2020-06-28 — End: 2020-07-07

## 2020-06-28 MED ORDER — CLOPIDOGREL 75 MG TABLET
75 mg | Freq: Every day | ORAL | Status: DC
Start: 2020-06-28 — End: 2020-07-07
  Administered 2020-06-29 – 2020-07-05 (×7): 75 mg via ORAL

## 2020-06-28 MED ORDER — ESCITALOPRAM 5 MG TABLET
5 mg | Freq: Every evening | ORAL | Status: DC
Start: 2020-06-28 — End: 2020-07-07
  Administered 2020-06-29 – 2020-07-05 (×7): 5 mg via ORAL

## 2020-06-28 MED ORDER — MYCOPHENOLATE MOFETIL 500 MG TABLET
500 mg | Freq: Two times a day (BID) | ORAL | Status: DC
Start: 2020-06-28 — End: 2020-07-07
  Administered 2020-06-29 – 2020-07-05 (×14): 500 mg via ORAL

## 2020-06-28 MED ORDER — PANTOPRAZOLE 40 MG TABLET,DELAYED RELEASE
40 mg | Freq: Every day | ORAL | Status: DC
Start: 2020-06-28 — End: 2020-07-07
  Administered 2020-06-29 – 2020-07-05 (×7): 40 mg via ORAL

## 2020-06-28 MED ORDER — PIPERACILLIN-TAZOBACTAM (ZOSYN) 3.375GM MBP
Freq: Four times a day (QID) | INTRAVENOUS | Status: DC
Start: 2020-06-28 — End: 2020-06-30
  Administered 2020-06-28 – 2020-06-29 (×4): 50.000 mL/h via INTRAVENOUS

## 2020-06-28 MED ORDER — CETIRIZINE 5 MG TABLET
5 mg | Freq: Every day | ORAL | Status: DC
Start: 2020-06-28 — End: 2020-07-07
  Administered 2020-06-29 – 2020-07-05 (×7): 5 mg via ORAL

## 2020-06-28 MED ORDER — FUROSEMIDE 40 MG TABLET
40 mg | Freq: Every day | ORAL | Status: AC
Start: 2020-06-28 — End: ?

## 2020-06-28 MED ORDER — LORAZEPAM 0.5 MG TABLET
0.5 mg | Freq: Four times a day (QID) | ORAL | Status: DC | PRN
Start: 2020-06-28 — End: 2020-07-07
  Administered 2020-07-03 – 2020-07-05 (×3): 0.5 mg via ORAL

## 2020-06-28 MED ORDER — GABAPENTIN 300 MG CAPSULE
300 mg | Freq: Every evening | ORAL | Status: DC
Start: 2020-06-28 — End: 2020-06-30
  Administered 2020-06-29 – 2020-06-30 (×2): 300 mg via ORAL

## 2020-06-28 MED ORDER — PROCHLORPERAZINE MALEATE 10 MG TABLET
10 mg | ORAL_TABLET | Freq: Four times a day (QID) | ORAL | 6 refills | Status: AC | PRN
Start: 2020-06-28 — End: ?

## 2020-06-28 MED ORDER — IPRATROPIUM 0.5 MG-ALBUTEROL 3 MG (2.5 MG BASE)/3 ML NEBULIZATION SOLN
0.5 mg-3 mg(2.5 mg base)/3 mL | Freq: Four times a day (QID) | RESPIRATORY_TRACT | Status: DC
Start: 2020-06-28 — End: 2020-07-07
  Administered 2020-06-28 – 2020-07-06 (×19): 0.5 mL via RESPIRATORY_TRACT

## 2020-06-28 MED ORDER — PREDNISONE 50 MG TABLET
50 mg | Freq: Every day | ORAL | Status: DC
Start: 2020-06-28 — End: 2020-07-05
  Administered 2020-06-29 – 2020-07-05 (×7): 50 mg via ORAL

## 2020-06-28 MED ORDER — PROCHLORPERAZINE MALEATE 5 MG TABLET
5 mg | Freq: Four times a day (QID) | ORAL | Status: DC | PRN
Start: 2020-06-28 — End: 2020-07-05

## 2020-06-28 MED ORDER — POLYETHYLENE GLYCOL 3350 17 GRAM ORAL POWDER PACKET
17 gram | Freq: Every day | ORAL | Status: DC | PRN
Start: 2020-06-28 — End: 2020-07-07

## 2020-06-28 MED ORDER — VANCOMYCIN IVPB (2 G IN 500ML NS)
Freq: Once | INTRAVENOUS | Status: CP
Start: 2020-06-28 — End: ?
  Administered 2020-06-29: 02:00:00 500.000 mL/h via INTRAVENOUS

## 2020-06-28 MED ORDER — FUROSEMIDE 10 MG/ML INJECTION SOLUTION
10 mg/mL | Freq: Once | INTRAVENOUS | Status: CP
Start: 2020-06-28 — End: ?
  Administered 2020-06-28: 16:00:00 10 mL via INTRAVENOUS

## 2020-06-28 MED ORDER — VANCOMYCIN THERAPY PLACEHOLDER
INTRAVENOUS | Status: DC
Start: 2020-06-28 — End: 2020-06-30

## 2020-06-28 MED ORDER — CEFTRIAXONE IV PUSH 1000 MG VIAL & STERILE WATER (ADULTS)
Freq: Once | INTRAVENOUS | Status: CP
Start: 2020-06-28 — End: ?
  Administered 2020-06-28: 17:00:00 10.000 mL via INTRAVENOUS

## 2020-06-28 MED ORDER — OXYBUTYNIN CHLORIDE ER 5 MG TABLET,EXTENDED RELEASE 24 HR
5 mg | Freq: Every day | ORAL | Status: DC
Start: 2020-06-28 — End: 2020-07-07
  Administered 2020-06-29 – 2020-07-05 (×7): 5 mg via ORAL

## 2020-06-28 MED ORDER — IPRATROPIUM 0.5 MG-ALBUTEROL 3 MG (2.5 MG BASE)/3 ML NEBULIZATION SOLN
0.5 mg-3 mg(2.5 mg base)/3 mL | Freq: Once | RESPIRATORY_TRACT | Status: CP
Start: 2020-06-28 — End: ?
  Administered 2020-06-28: 17:00:00 0.5 mL via RESPIRATORY_TRACT

## 2020-06-28 MED ORDER — TAMSULOSIN 0.4 MG CAPSULE
0.4 mg | Freq: Every evening | ORAL | Status: DC
Start: 2020-06-28 — End: 2020-07-07
  Administered 2020-06-29 – 2020-07-04 (×6): 0.4 mg via ORAL

## 2020-06-28 MED ORDER — ASPIRIN 81 MG TABLET,DELAYED RELEASE
81 mg | Freq: Every day | ORAL | Status: DC
Start: 2020-06-28 — End: 2020-07-07
  Administered 2020-06-29 – 2020-07-05 (×7): 81 mg via ORAL

## 2020-06-28 MED ORDER — HEPARIN (PORCINE) 5,000 UNIT/ML INJECTION SOLUTION
5000 unit/mL | Freq: Three times a day (TID) | SUBCUTANEOUS | Status: DC
Start: 2020-06-28 — End: 2020-07-07
  Administered 2020-06-29 – 2020-07-06 (×17): via SUBCUTANEOUS

## 2020-06-28 NOTE — H&P
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  He reports OT came to house and he felt lightheaded and fell to the floor. OT noted hypotension of SBP 80s and oxygen of 80%.  He reports decreased appetite. Denies sick contacts.  Denies head trauma. Denies fever and chills. ROS: as aboveED course: Pulmonary edema noted on chest xray. He was given furosemide 80 mg IV. Probnp 912. Procalcitonin 0.30. Troponin 0.05Medical History: PMH PSH Past Medical History: Diagnosis Date ? Anxiety  ? CAD (coronary artery disease)  ? Diabetes mellitus (HC Code) (HC CODE)  ? Difficult intubation 05/22/2020  Patient with significant soft tissue in oropharynx. Glidescope intubation revealed anterior cords with floppy soft tissue obstruction. Patient will require video laryngoscopy for airway management going forward. ? GERD (gastroesophageal reflux disease)  ? Hyperlipidemia  ? Hypertension  ? Hypertrophy of prostate without urinary obstruction and other lower urinary tract symptoms (LUTS)  ? Other testicular hypofunction  ? Spinal stenosis 09/28/2018 ? Tobacco abuse  ? Vitamin D deficiency   Past Surgical History: Procedure Laterality Date ? CAROTID STENT  08/28/2003 ? CHOLECYSTECTOMY  11/07/2016 ? KNEE SURGERY    torn meniscus ? LAMINECTOMY  10/07/2008 ? LUMBAR DISC SURGERY    herniated disc  Social History Family History Social History Socioeconomic History ? Marital status: Divorced   Spouse name: Not on file ? Number of children: Not on file ? Years of education: Not on file ? Highest education level: Not on file Occupational History ? Not on file Tobacco Use ? Smoking status: Current Every Day Smoker   Types: Cigarettes Substance and Sexual Activity ? Alcohol use: Not on file ? Drug use: Not on file ? Sexual activity: Not on file Other Topics Concern ? Not on file Social History Narrative  As of 2021: Was living?in [place]?and moved back to [place] to be near family in 2021. He moved into his niece's in-law apartment in [place] while undergoing cancer treatment. ?He is currently self sufficient and independent.  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  -VNA coming for med management and HHA assistance  -family very involved  -he is divorced and has 1 daughter with a 72 month old who lives 30 minutes away and a son with a 42 month old in [place]    Smoking hx 49 PYH  -current 1 PPD  -meeting with smoking cessation  EtOH - not much   Social Determinants of Conservation officer, historic buildings Strain:  ? Difficulty of Paying Living Expenses:  Food Insecurity:  ? Worried About Programme researcher, broadcasting/film/video in the Last Year:  ? Barista in the Last Year:  Transportation Needs:  ? Freight forwarder (Medical):  ? Lack of Transportation (Non-Medical):  Physical Activity:  ? Days of Exercise per Week:  ? Minutes of Exercise per Session:  Stress:  ? Feeling of Stress :  Social Connections:  ? Frequency of Communication with Friends and Family:  ? Frequency of Social Gatherings with Friends and Family:  ? Attends  Religious Services:  ? Active Member of Clubs or Organizations:  ? Attends Banker Meetings:  ? Marital Status:  Intimate Partner Violence:  ? Fear of Current or Ex-Partner:  ? Emotionally Abused:  ? Physically Abused:  ? Sexually Abused:   No family history on file. Prior to Admission Medications (Not in a hospital admission) Allergies No Known Allergies Review of Systems: Review of Systems: as aboveAll other systems reviewed and are negative.Objective: Vitals:I have reviewed the patient's current vital signs as documented in the patient's EMR.  BP 120/63  - Pulse (!) 59  - Temp 97 ?F (36.1 ?C) (Temporal)  - Resp 16  - SpO2 100% Physical Exam: Gen: Alert, NADHead: Atraumatic EENT: Not pale, anicteric, MMMNeck: No LAN or thyromegalyResp: Diminished breath soundsCVS: S1 S2 reg no m/r/gGI: ND, BS+, NT, no HSMGU: No CVA or suprapubic tendernessSkin: No rash or ecchymosesNeuro: Alert, oriented X : +2 pitting bilateral edemaLabs: I have reviewed the patient's labs within the last 24 hrs.Recent Labs Lab 10/27/210449 10/27/210758 10/27/211428 10/27/211720 10/27/212138 10/28/210623 10/28/210754 11/02/211133 NA 138  --  138  --   --  140  --  137 K 5.3  --  5.1  --   --  4.2  --  4.4 CL 98  --  99  --   --  101  --  98 CO2 27  --  26  --   --  28  --  24 BUN 44*  --  43*  --   --  38*  --  44* CREATININE 1.95*  --  1.77*  --   --  1.68*  --  2.36* GLU 101*   < > 212*   < > 213* 106* 109* 148* CALCIUM 9.2  --  9.1  --   --  9.0  --  8.5* MG  --   --   --   --   --  2.3  --   --   < > = values in this interval not displayed. Recent Labs Lab 11/02/211133 WBC 36.4* HGB 10.5* HCT 31.4* PLT 231 MCV 93.7 NEUTROPHILS 92.5* MONOCYTES 1.4* Recent Labs Lab 11/02/211133 LABPROT 10.9 INR 1.00 No results for input(s): ALKPHOS, BILITOT, BILIDIR, PROT, ALT, AST in the last 168 hours.Invalid input(s): LABALBU No results for input(s): AMYLASE, LIPASE in the last 168 hours.No results for input(s): PHART, PCO2ART, PO2ART, O2SATART, BEART, HCO3ART in the last 168 hours.Recent Labs Lab 10/28/210623 TROPONINT 0.07* Diagnostics:I have reviewed the diagnostic studies done in the last 24 hours.XR Chest PA or AP Final Result  Similar appearance of cardiomegaly and central vascular congestion as well as interstitial prominence suggesting pulmonary edema, superimposed on a background of COPD and innumerable lung metastases. A superimposed infectious process would be difficult to exclude radiographically in this setting and should be correlated for clinically.    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 Noted to have elevated leukocytosis, procalcitonin as well as elevated trop and probnp. Chest xray suggest pulmonary edema, superimposed on a  background of COPD and innumerable lung metastases and possible PNA. Covid negative. Acute on hypoxemic respiratory failure, multifactorial, pulmonary edema, progression of pulmonary mets, and superimposed HAP. Plan: #Acute on hypoxemic respiratory failure multifactorial, pulmonary edema, progression of pulmonary mets, and superimposed HAP. -ceftriaxone and doxy given in ed, will broaden to vancomycin and zosyn. Continue doxy-Cards consulted-Lasix 80 mg IV given by ED team. No urine output reported. Bladder scan at 240 cc-Trend trop-continue telemetry-Strict I & O, daily weights-Wean oxygen -Recent 2D echo reviewed on 06/17/20-smoking cessation-oncology can be consulted in am, to determine plan for pulmonary mets#Pembrolizumab associated myocarditis -prednisone taper as outlined by outpatient cards onc.-Continue prednisone 100mg  (was being tapered over the past few weeks by card oncology)#AKI on CKD- prerenal of concern, query cardiorenal-hold ace inhibitor, metoprolol, bactrim-Bladder scan qid with PVR, 240 cc-Obtain urine lytes-Can consider renal ultrasound if no improvement#Leukocytosis in the setting of presumed HAP and prednisone-Obtain blood cultures x 2-Obtain urine cultures-continue antibiotics-hold metoprolol tonight given hypotension#Metastatic urothelial cancer (mets to lung)-Recent [place] scan of chest with progression of disease-Follow up with oncology for treatment plan-continue long acting oxycodone at home dose-short acting oxycontin was prescribed for 10 days -Will need to clarify pain regimen-Known to o/p palliative care per sister, LindaHome Medication Reconciliation: Completed, verified with pharmacy and recent dc summaryDVT ppx: heparin subqFamily communication, updated LindaCODE STATUS: full code, discussed with patientNotifications: PCP:   Name), No Pcp (Do Not Change NonePlan discussed with patient and/or family. Yes Advance Care Planning Patient has a living will: Dorette Grate has healthcare representative/proxy: Cecile Hearing enter code [id number] if patient answers Yes or code [id number] if patient answers No to any of the above questions and you are not completing a 30 minute or more Advanced Care Planning encounter Signed:Ames Hoban, MD Attending Physician [phone] I Adekolu11/09/2019 2:29 PM

## 2020-06-28 NOTE — Telephone Encounter
Patient's sister is calling to let us know the patient is on his way to the ER due to low BP and oxygen. When he stands up he collapses.      [phone]

## 2020-06-28 NOTE — Telephone Encounter
Noted! Thank you

## 2020-06-28 NOTE — Consults
VANCOMYCIN CONSULT Loading Dose: N/ACurrent Vancomycin Order: 2 g IV per laboratory/placeholder data.  Vancomycin Indication: Suspected MRSA infectionRenal Function: AKIScr: Creatinine (mg/dL) Date Value 09/81/1914 2.36 (H) 06/23/2020 1.68 (H) 06/22/2020 1.77 (H)  CrCl: Estimated Creatinine Clearance: 33 mL/min (A) (by C-G formula based on SCr of 2.36 mg/dL (H)).Day of Therapy: 1Planned Duration of Therapy: unknownTrough Goal: 10-15Vancomycin level: indicated based on the following: Rapid change in renal functionVancomycin Level Scheduled:  Random on 11/3 at 1800 (24 hours post-dose)ID/AST Consulted? NoYNHH/LMH/WH Vancomycin Dosing GuidelineBH Vancomycin Dosing GuidelineFor questions, please contact the pharmacist: Donia Pounds, PharmD         Phone/Mobile Heartbeat: MHB

## 2020-06-28 NOTE — Utilization Review (ED)
UM Status: Managed Medicare IP for HF exacerbated by sotolol, hypoxic on RA to 89% requiring supplemental O2 to maintain sat, Dr Lorin Glass

## 2020-06-28 NOTE — ED Notes
11:28 AM BIBA to R1 as MEDICAL PATIENT in setting of SOB and hypoxia, hx of prostate CA with lung mets. Recently admitted for CHF exac.  When EMS arrived on scene O2 sat 89% on RA, low flow NC improved to 92%, BG 138.  Transferred to stretcher, placed on full cardiac monitor, additional PIV placed, labs collected/sent.  Past Medical History: Diagnosis Date ? Anxiety  ? CAD (coronary artery disease)  ? Diabetes mellitus (HC Code) (HC CODE)  ? Difficult intubation 05/22/2020  Patient with significant soft tissue in oropharynx. Glidescope intubation revealed anterior cords with floppy soft tissue obstruction. Patient will require video laryngoscopy for airway management going forward. ? GERD (gastroesophageal reflux disease)  ? Hyperlipidemia  ? Hypertension  ? Hypertrophy of prostate without urinary obstruction and other lower urinary tract symptoms (LUTS)  ? Other testicular hypofunction  ? Spinal stenosis 09/28/2018 ? Tobacco abuse  ? Vitamin D deficiency  Past Surgical History: Procedure Laterality Date ? CAROTID STENT  08/28/2003 ? CHOLECYSTECTOMY  11/07/2016 ? KNEE SURGERY    torn meniscus ? LAMINECTOMY  10/07/2008 ? LUMBAR DISC SURGERY    herniated disc 11:38 AM EKG completed, portable CXR in progress

## 2020-06-28 NOTE — ED Notes
7:26 PM Transferred to C side, on 2LNC, no s/s of distress. C/o pain, given scheduled oxycotin. Troponin sent, patient difficult stick and we were unable to obtain blood cultures. Dr. Sibyl Parr notified.

## 2020-06-28 NOTE — ED Provider Notes
PGY-2 Resident NoteHPI: Paul Hunter is 69 y.o. 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 Patient was getting OT at home when they noted him to be hypoxic to 89% on room air and short of breath.Came to R room on non-rebreather. Denies chest pain. Reports ongoing bilateral leg swelling. Was discharged from hospital on 06/23/20 after admission for HF exacerbation. He did have a fall on 10/27 when standing up from seated position on the toilet, head [place] negative, no LOC and was felt to be orthostasis related. Physical exam:  NAD. Lungs with diffuse wheezing and basilar crackles. Abdomen soft, non-tender. Ecchymoses on LLQ of abdomen. LE with pitting edema to the knees. Bedside echo with bilateral B lines.Labs obtained: CBC, BMP, PT INR, BNP, covid, CG4, tropImaging/Other: CXR, EKGMDM/Ddx: Paul Hunter is 69 y.o. male with PMHx as above presenting with shortness of breath and hypoxia. On 2L O2, stable. DDX includes: PNA, HF exacerbationBased on exam, history, labs, and imaging, low suspicion at this time for PE, ACS, aortic dissectionED Course: duoneb, lasix, admitted to medicine. Labs reviewed, BNP 912, Cr 2.36 (mixed picture, unclear if too dry or from congestion). CXR with similar appearance of cardiomegaly and central vascular congestion as well as interstitial prominence suggesting pulmonary edema, superimposed on a background of COPD and innumerable lung metastases.Covered with ceftriaxone doxy empirically. Procal added on.Paul Hunter, MDEmergency Medicine PGY-211/09/2019 11:46 AMHistoryChief Complaint Patient presents with ? Shortness of Breath   weakness this morning with sob when OT came to his house. hx bladder ca w/ mets to lungs. recenty discharge from hospital. RA sat 89 %, placed on NRB. hypotensive. -fevers   The history is provided by the patient. No language interpreter was used. OtherThis is a new problem. The current episode started 3 to 5 hours ago. The problem occurs constantly. The problem has not changed since onset.Associated symptoms include shortness of breath. Nothing aggravates the symptoms. Nothing relieves the symptoms. He has tried nothing for the symptoms. The treatment provided no relief.  Past Medical History: Diagnosis Date ? Anxiety  ? CAD (coronary artery disease)  ? Diabetes mellitus (HC Code) (HC CODE)  ? Difficult intubation 05/22/2020  Patient with significant soft tissue in oropharynx. Glidescope intubation revealed anterior cords with floppy soft tissue obstruction. Patient will require video laryngoscopy for airway management going forward. ? GERD (gastroesophageal reflux disease)  ? Hyperlipidemia  ? Hypertension  ? Hypertrophy of prostate without urinary obstruction and other lower urinary tract symptoms (LUTS)  ? Other testicular hypofunction  ? Spinal stenosis 09/28/2018 ? Tobacco abuse  ? Vitamin D deficiency  Past Surgical History: Procedure Laterality Date ? CAROTID STENT  08/28/2003 ? CHOLECYSTECTOMY  11/07/2016 ? KNEE SURGERY    torn meniscus ? LAMINECTOMY  10/07/2008 ? LUMBAR DISC SURGERY    herniated disc No family history on file.Social History Socioeconomic History ? Marital status: Divorced   Spouse name: Not on file ? Number of children: Not on file ? Years of education: Not on file ? Highest education level: Not on file Tobacco Use ? Smoking status: Current Every Day Smoker   Types: Cigarettes Social History Narrative  As of 2021: Was living?in [place]?and moved back to [place] to be near family in 2021. He moved into his niece's in-law apartment in [place] while undergoing cancer treatment. ?He is currently self sufficient and independent.  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  -VNA coming for med management and HHA assistance  -family very involved  -he is divorced and has 1 daughter with a 48 month old who lives 30 minutes away and a son with a 69 month old in [place]    Smoking hx 34 PYH  -current 1 PPD  -meeting with smoking cessation  EtOH - not much   ED Other Social History E-cigarette/Vaping Substances E-cigarette/Vaping Devices Review of Systems Respiratory: Positive for shortness of breath.  Cardiovascular: Positive for leg swelling. All other systems reviewed and are negative. Physical ExamED Triage Vitals [06/28/20 1124]BP: (!) 74/47Pulse: 61Pulse from  O2 sat: n/aResp: 17Temp: 97.3 ?F (36.3 ?C)Temp src: OralSpO2: 97 % BP 120/63  - Pulse (!) 59  - Temp 97 ?F (36.1 ?C) (Temporal)  - Resp 16  - SpO2 100% Physical ExamConstitutional:     General: He is not in acute distress.HENT:    Head: Normocephalic and atraumatic. Eyes:    Extraocular Movements: Extraocular movements intact. Cardiovascular:    Rate and Rhythm: Normal rate and regular rhythm. Pulmonary:    Effort: Pulmonary effort is normal.    Breath sounds: Decreased breath sounds and wheezing present. Chest:    Chest wall: No deformity, tenderness, crepitus or edema. Abdominal:    Palpations: Abdomen is soft. Musculoskeletal:       General: Normal range of motion.    Cervical back: Normal range of motion.    Right lower leg: No tenderness. Edema present.    Left lower leg: No tenderness. Edema present. Skin:   General: Skin is warm and dry. Neurological:    General: No focal deficit present.    Mental Status: He is alert and oriented to person, place, and time. Psychiatric:       Mood and Affect: Mood normal.       Behavior: Behavior normal.  ProceduresED Critical CarePerformed by: Cameron Proud, MDAuthorized by: Cameron Proud, MD Critical care provider statement:   Critical care time (minutes):  30  Critical care was necessary to treat or prevent imminent or life-threatening deterioration of the following conditions:  Cardiac failure  Critical care was time spent personally by me on the following activities:  Development of treatment plan with patient or surrogate, evaluation of patient's response to treatment, examination of patient, obtaining history from patient or surrogate, ordering and performing treatments and interventions, ordering and review of laboratory studies, ordering and review of radiographic studies, pulse oximetry, re-evaluation of patient's condition and review of old charts ED COURSEReviewed previous: ECG, previous chart and labsInterpreted by ED Provider: labs, ECG, x-ray and pulse oximetryPatient Reevaluation: 69 year old with 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  Recently diagnosed keytruda associated myocarditis.Here hypoxic, 80's now on oxygen Echo, with very low EF, small amount B lines, not plethoricLower extremity edemaWill check his albuminCXR PE and numerous lungs mets, EKG SR with PACsBNP 912, Cr 2.36Will give antibiotics as cannot rule out infectious process, lasix dosingDifficulty balancing act as does not appear very intervascularly overloaded and marked reduction in EFAdmit to medicineReview goals of care during admissionPt lives with niece but not sure how much help he is getting.Attending Supervised: ResidentI saw and examined the patient. I agree with the findings and plan of care as documented in the resident's note. 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

## 2020-06-28 NOTE — Telephone Encounter
SPoke with sister and stated that she received a call from PT/OT today.  Stated that pt BP 80/50 and O2 sat was 80%.Sister stated that last week he came home from the hospital.  Has had decreased po intake, decreased mobility,   Stated he also has decreased smoking ( he normally smokes 1 1/2 packs a day).   PT/OT stated that pt stood up and collasped to the floor.  Stated he is en route to the EDWill notify Lorain Childes, APRN

## 2020-06-28 NOTE — ED Notes
1:32 PM Pt medicated per MAR. Pt resting with no issues at this time. Chief Complaint Patient presents with ? Shortness of Breath   weakness this morning with sob when OT came to his house. hx bladder ca w/ mets to lungs. recenty discharge from hospital. RA sat 89 %, placed on NRB. hypotensive. -fevers  Past Medical History: Diagnosis Date ? Anxiety  ? CAD (coronary artery disease)  ? Diabetes mellitus (HC Code) (HC CODE)  ? Difficult intubation 05/22/2020  Patient with significant soft tissue in oropharynx. Glidescope intubation revealed anterior cords with floppy soft tissue obstruction. Patient will require video laryngoscopy for airway management going forward. ? GERD (gastroesophageal reflux disease)  ? Hyperlipidemia  ? Hypertension  ? Hypertrophy of prostate without urinary obstruction and other lower urinary tract symptoms (LUTS)  ? Other testicular hypofunction  ? Spinal stenosis 09/28/2018 ? Tobacco abuse  ? Vitamin D deficiency  Darcella Gasman, RN

## 2020-06-28 NOTE — Other
-    CONSULT  REQUEST  DOCUMENTATION  -  [facility] NOTE  -  Type of consult: [facility] Cardiology CHF    -  New Consult: [id number] /Paul Hunter /Location: R02 HALL/R02HALL A / *Brief Clinical Question: Pulmonary edema, elevated trop, recent dx with immunotherapy associated myocarditis/Callback Cell Phone: [phone]** / Please confirm receipt of this message by texting back ?OK?  -  1 - Mobile Heartbeat message sent to [place] at 3:43 PM. Received response at 1607.  Casimiro Needle [facility]  06/28/2020  3:43 PM  Consult [facility] [phone]

## 2020-06-28 NOTE — ED Notes
3:09 PM Bladder scan 264 mL.

## 2020-06-29 ENCOUNTER — Inpatient Hospital Stay: Admit: 2020-06-29 | Payer: PRIVATE HEALTH INSURANCE

## 2020-06-29 ENCOUNTER — Encounter: Admit: 2020-06-29 | Payer: PRIVATE HEALTH INSURANCE | Attending: Vascular and Interventional Radiology

## 2020-06-29 DIAGNOSIS — N179 Acute kidney failure, unspecified: Secondary | ICD-10-CM

## 2020-06-29 LAB — URINALYSIS-MACROSCOPIC W/REFLEX MICROSCOPIC
BKR BILIRUBIN, UA: NEGATIVE
BKR GLUCOSE, UA: NEGATIVE
BKR KETONES, UA: NEGATIVE
BKR LEUKOCYTE ESTERASE, UA: POSITIVE — AB
BKR NITRITE, UA: NEGATIVE
BKR PH, UA: 5.5 mg/mg Cr — ABNORMAL HIGH (ref 5.5–7.5)
BKR SPECIFIC GRAVITY, UA: 1.026 (ref 1.005–1.030)
BKR UROBILINOGEN, UA: <2 EU/dL (ref <=2.0)

## 2020-06-29 LAB — CBC WITH AUTO DIFFERENTIAL
BKR WAM ABSOLUTE NRBC: 0 x 1000/??L (ref 0.0–0.0)
BKR WAM HEMATOCRIT: 29.7 % — ABNORMAL LOW (ref 37.0–52.0)
BKR WAM HEMOGLOBIN: 10.1 g/dL — ABNORMAL LOW (ref 12.0–18.0)
BKR WAM MCH (PG): 32.5 pg — ABNORMAL HIGH (ref 27.0–31.0)
BKR WAM MCHC: 34 g/dL (ref 31.0–36.0)
BKR WAM MCV: 95.5 fL — ABNORMAL HIGH (ref 78.0–94.0)
BKR WAM MPV: 10.3 fL (ref 6.0–11.0)
BKR WAM NUCLEATED RED BLOOD CELLS: 0 % (ref 0.0–1.0)
BKR WAM PLATELETS: 214 x1000/ÂµL (ref 140–440)
BKR WAM RDW-CV: 15 % — ABNORMAL HIGH (ref 11.5–14.5)
BKR WAM RED BLOOD CELL COUNT: 3.1 M/ÂµL — ABNORMAL LOW (ref 3.8–5.9)
BKR WAM WHITE BLOOD CELL COUNT: 32 x1000/??L — ABNORMAL HIGH (ref 4.0–10.0)

## 2020-06-29 LAB — URINALYSIS WITH CULTURE REFLEX      (BH LMW YH)
BKR BILIRUBIN, UA: NEGATIVE
BKR BLOOD, UA: NEGATIVE
BKR GLUCOSE, UA: NEGATIVE
BKR KETONES, UA: NEGATIVE
BKR LEUKOCYTE ESTERASE, UA: NEGATIVE
BKR NITRITE, UA: NEGATIVE
BKR PH, UA: 5.5 (ref 5.5–7.5)
BKR SPECIFIC GRAVITY, UA: 1.02 (ref 1.005–1.030)
BKR UROBILINOGEN, UA: <2 EU/dL (ref <=2.0)

## 2020-06-29 LAB — CK     (BH GH L LMW YH): BKR CREATINE KINASE TOTAL: 26 U/L (ref 11–204)

## 2020-06-29 LAB — URINE MICROSCOPIC     (BH GH LMW YH)
BKR HYALINE CASTS, UA INSTRUMENT (NUMERIC): 0 /LPF (ref 0–3)
BKR RBC/HPF INSTRUMENT: 5 /HPF — ABNORMAL HIGH (ref 0–2)
BKR WBC/HPF INSTRUMENT: 14 /HPF — ABNORMAL HIGH (ref 0–5)

## 2020-06-29 LAB — BASIC METABOLIC PANEL
BKR ANION GAP: 11 (ref 7–17)
BKR BLOOD UREA NITROGEN: 46 mg/dL — ABNORMAL HIGH (ref 8–23)
BKR BUN / CREAT RATIO: 19.3 umol/L — ABNORMAL HIGH (ref 8.0–23.0)
BKR CALCIUM: 8.5 mg/dL — ABNORMAL LOW (ref 8.8–10.2)
BKR CHLORIDE: 100 mmol/L (ref 98–107)
BKR CO2: 27 mmol/L (ref 20–30)
BKR CREATININE: 2.38 mg/dL — ABNORMAL HIGH (ref 0.40–1.30)
BKR EGFR (AFR AMER): 33 mL/min/{1.73_m2} (ref >60)
BKR EGFR (NON AFRICAN AMERICAN): 27 mL/min/1.73m2 — ABNORMAL HIGH (ref >60)
BKR GLUCOSE: 95 mg/dL (ref 70–100)
BKR POTASSIUM: 3.8 mmol/L — ABNORMAL HIGH (ref 3.3–5.3)
BKR SODIUM: 138 mmol/L (ref 136–144)

## 2020-06-29 LAB — MANUAL DIFFERENTIAL
BKR WAM BASOPHIL - ABS (DIFF): 0 x 1000/ÂµL (ref 0.0–0.0)
BKR WAM BASOPHILS (DIFF): 0 % (ref 0.0–4.0)
BKR WAM EOSINOPHIL - ABS (DIFF): 0 x 1000/ÂµL (ref 0.0–1.0)
BKR WAM EOSINOPHILS (DIFF): 0 % (ref 0.0–7.0)
BKR WAM LYMPHOCYTE - ABS (DIFF): 1 x 1000/??L (ref 1.0–4.0)
BKR WAM LYMPHOCYTES (DIFF): 3 % — ABNORMAL LOW (ref 8.0–49.0)
BKR WAM MONOCYTE - ABS (DIFF): 0.6 x 1000/ÂµL (ref 0.0–2.0)
BKR WAM MONOCYTES (DIFF): 2 % — ABNORMAL LOW (ref 4.0–15.0)
BKR WAM NEUTROPHILS (DIFF): 94 % — ABNORMAL HIGH (ref 37.0–84.0)
BKR WAM NEUTROPHILS - ABS (DIFF): 30.1 x 1000/??L — ABNORMAL HIGH (ref 1.0–11.0)
BKR WAM NORMAL RBC MORPHOLOGY: NORMAL
BKR WAM PROMYELOCYTES (DIFF): 1 % — ABNORMAL HIGH (ref 0–0)

## 2020-06-29 LAB — TSH W/REFLEX TO FT4     (BH GH LMW Q YH): BKR THYROID STIMULATING HORMONE: 1.91 ??IU/mL — AB

## 2020-06-29 LAB — AMMONIA     (BH GH L LMW YH): BKR AMMONIA: 12 umol/L (ref 11–35)

## 2020-06-29 LAB — UA REFLEX CULTURE

## 2020-06-29 LAB — NT-PROBNPE: BKR B-TYPE NATRIURETIC PEPTIDE, PRO (PROBNP): 952 pg/mL — ABNORMAL HIGH (ref <125.0)

## 2020-06-29 LAB — UREA NITROGEN, URINE, RANDOM     (BH GH LMW Q YH): BKR UREA NITROGEN, URINE, RANDOM: 979 mg/dL

## 2020-06-29 LAB — PROTEIN, TOTAL W/CREATININE, URINE, RANDOM     (BH GH LMW YH)
BKR CREATININE, URINE, RANDOM: 143 mg/dL
BKR PROTEIN URINE RANDOM: 0.41 g/L — AB
BKR PROTEIN/CREATININE RATIO, URINE, RANDOM: 0.29 mg/mg{creat} — ABNORMAL HIGH (ref <0.10)

## 2020-06-29 LAB — CREATININE, URINE, RANDOM: BKR CREATININE, URINE, RANDOM: 122 mg/dL

## 2020-06-29 LAB — SODIUM, URINE, RANDOM W/O CREATININE
BKR SODIUM, URINE RANDOM: 35 mmol/L
BKR SODIUM, URINE RANDOM: 50 mmol/L

## 2020-06-29 LAB — POTASSIUM, URINE, RANDOM     (BH GH L LMW YH): BKR POTASSIUM, URINE, RANDOM: 46 mmol/L

## 2020-06-29 LAB — TROPONIN T     (Q): BKR TROPONIN T: 0.03 ng/mL — ABNORMAL HIGH (ref <0.01)

## 2020-06-29 MED ORDER — SODIUM CHLORIDE 0.9 % INTRAVENOUS SOLUTION
INTRAVENOUS | Status: DC
Start: 2020-06-29 — End: 2020-06-30
  Administered 2020-06-29: via INTRAVENOUS

## 2020-06-29 MED ORDER — VANCOMYCIN MAR LEVEL
Freq: Once | INTRAVENOUS | Status: DC
Start: 2020-06-29 — End: 2020-06-30

## 2020-06-29 MED ORDER — SULFUR HEXAFLUORIDE MICROSPHERES 25 MG INTRAVENOUS SUSPENSION
25 mg | Freq: Once | INTRAVENOUS | Status: CP | PRN
Start: 2020-06-29 — End: ?
  Administered 2020-06-29: 20:00:00 25 mL via INTRAVENOUS

## 2020-06-29 MED ORDER — METOPROLOL SUCCINATE ER 25 MG TABLET,EXTENDED RELEASE 24 HR
25 mg | Freq: Every day | ORAL | Status: DC
Start: 2020-06-29 — End: 2020-07-07
  Administered 2020-06-29 – 2020-07-05 (×7): 25 mg via ORAL

## 2020-06-29 NOTE — Other
-    CONSULT  REQUEST  DOCUMENTATION  -  [facility] NOTE  -  Type of consult: [facility] Nephrology General    -  New Consult: [id number] Paul Hua J Hunter /Location: 6822/6822-B / Brief Clinical Question: AKI, ?cardiorenal vs. related to diuresis, ?ATN due to intermittent hypotension, vs. checkpoint inhibitor related nephritis/Callback Cell Phone: [phone] / Please confirm receipt of this message by texting back ?OK?  -  1 - Mobile Heartbeat message sent to Mikki Santee Sheheryar at 11:30 AM. Received response at 1258.  Alycia Rossetti  06/29/2020  11:30 AM  Consult [facility] [phone]

## 2020-06-29 NOTE — ED Provider Notes
ZOX:WRUEAVW is a 69 year old male with a history of stage IV non small cell lung cancer currently undergoing chemotherapy, CAD on dual anti platelet therapy, dm 2, HLD, HTN, cholecystectomy, nephrolithiasis, hypertrophic prostate with history of urinary obstruction who presents for 3 days of worsening abdominal pain.  Patient presented to urgent care for evaluation of urinary retention 2 days ago and a Foley catheter was placed with bloody urine output that cleared with flushing.  Patient was scheduled for Urology follow-up [place] urogram on 09/24 as well as cystoscopy with Dr. Doreene Nest on 09/23. Pt notes regular flow of urine s/p foley. Of note patient was recently hospitalized between 9/13 and 9/16 for decompensated heart failure requiring IV diuresis wherein he was noted to have intermittent hematuria.  Patient states that his overall work of breathing and lower extremity swelling significantly improved throughout his hospitalization and has remained improved.  Patient denies any recent trauma, fevers, chills, nausea or vomiting, diarrhea.Exam notable UJW:JXBJY pressure (!) 131/94, pulse 64, temperature 97.9 ?F (36.6 ?C), temperature source Oral, resp. rate 20, weight 136.1 kg, SpO2 98 %. Male patient resting in exam bed who appears uncomfortable and is actively writhing in pain.  Lungs clear to auscultation bilaterally, regular heart rate.  Abdomen soft but tender to palpation of in the epigastric, left upper quadrant and left lower quadrant.  GU exam notable for scant bleeding around urethral meatus however otherwise unremarkable with bilateral scrotal contents unremarkable with no tenderness to palpation.  Intact cremasteric reflex bilaterally.  No swelling, skin changes noted to scrotum.  No indication of inguinal hernia bilaterally.  1+ edema noted to bilateral lower extremities. Multiple areas of bruising noted to b/l ue, which pt states is baseline for him. MDM/ED Course:69 year old male with history of NSCLC, nephrolithiasis, BPH who presents with 3 days of abdominal pain/hematuria c/f urinary retention, kidney stone, malignancy. Plan for labs, UA, [place] a/p, urology consult. 4:30 PMCT abdomen pelvis significant for worsening mild left hydroureteronephrosis with possible superimposed infection.  Bladder is decompressed.  Additionally worsening noted of known lung lesions.Patient notified of findings and states he has a follow-up appointment with his pulmonologist in approximately 2 weeks to follow-up on his lung cancer.  Additionally patient has an upcoming appointment with his urologist in 3 days for cystoscopy.  Plan for discharge with follow-up as above.FYI:ED providing team was contacted by patient's cardiology service who requested added on labs of troponin, BNP and EKG which they state they will follow up on at patient's upcoming outpatient visit and do not need patient to be retained in the department until resulted.DISPO:DischargeSeen under supervision of Dr. Levester Fresh, MDResident Physician, PGY-2[facility] MedicineAvailable on Mobile HeartbeatThis note may have been produced using voice transcription software: please excuse any typos, and feel free to contact me via Mobile Heartbeat for clarification(s).HistoryChief Complaint Patient presents with ? Hematuria   hx if stage 4 lung ca. + chemo. left sided abdominal pain.+ urinary catheter with hematuria.pt told to come in by urology. ? Abdominal Pain ? Dysuria  The history is provided by the patient. HematuriaThis is a new problem. The current episode started 2 days ago. The problem occurs constantly. The problem has been rapidly worsening. Associated symptoms include abdominal pain. The symptoms are aggravated by bending and walking. Nothing relieves the symptoms. Abdominal PainAssociated symptoms: dysuria and hematuria   Past Medical History: Diagnosis Date ? Anxiety  ? CAD (coronary artery disease)  ? Diabetes mellitus (HC Code) (HC CODE)  ? Difficult intubation 05/22/2020  Patient with  significant soft tissue in oropharynx. Glidescope intubation revealed anterior cords with floppy soft tissue obstruction. Patient will require video laryngoscopy for airway management going forward. ? GERD (gastroesophageal reflux disease)  ? Hyperlipidemia  ? Hypertension  ? Hypertrophy of prostate without urinary obstruction and other lower urinary tract symptoms (LUTS)  ? Other testicular hypofunction  ? Spinal stenosis 09/28/2018 ? Tobacco abuse  ? Vitamin D deficiency  Past Surgical History: Procedure Laterality Date ? CAROTID STENT  08/28/2003 ? CHOLECYSTECTOMY  11/07/2016 ? KNEE SURGERY    torn meniscus ? LAMINECTOMY  10/07/2008 ? LUMBAR DISC SURGERY    herniated disc No family history on file.Social History Socioeconomic History ? Marital status: Divorced   Spouse name: Not on file ? Number of children: Not on file ? Years of education: Not on file ? Highest education level: Not on file Tobacco Use ? Smoking status: Current Every Day Smoker   Types: Cigarettes Social History Narrative  As of 2021: Was living?in [place]?and moved back to [place] to be near family in 2021. He moved into his niece's in-law apartment in [place] while undergoing cancer treatment. ?He is currently self sufficient and independent.  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  -[employer] coming for med management and HHA assistance  -family very involved  -he is divorced and has 1 daughter with a 42 month old who lives 30 minutes away and a son with a 63 month old in [place]    Smoking hx 20 PYH  -current 1 PPD  -meeting with smoking cessation  EtOH - not much   ED Other Social History E-cigarette/Vaping Substances E-cigarette/Vaping Devices Review of Systems Constitutional: Negative.  HENT: Negative.  Eyes: Negative.  Gastrointestinal: Positive for abdominal pain. Endocrine: Negative.  Genitourinary: Positive for dysuria and hematuria. Musculoskeletal: Negative.  Allergic/Immunologic: Negative.  Neurological: Negative.  Hematological: Bruises/bleeds easily. Psychiatric/Behavioral: Negative.   Physical ExamED Triage Vitals [05/16/20 1108]BP: 119/73Pulse: 78Pulse from  O2 sat: n/aResp: 16Temp: 98.6 ?F (37 ?C)Temp src: OralSpO2: 96 % BP (!) 131/94  - Pulse 64  - Temp 97.9 ?F (36.6 ?C) (Oral)  - Resp 20  - Wt 136.1 kg  - SpO2 98%  - BMI 40.69 kg/m? Physical ExamVitals and nursing note reviewed. Constitutional:     Appearance: He is obese. HENT:    Head: Normocephalic and atraumatic.    Mouth/Throat:    Mouth: Mucous membranes are moist.    Pharynx: Oropharynx is clear. Eyes:    Extraocular Movements: Extraocular movements intact.    Pupils: Pupils are equal, round, and reactive to light. Cardiovascular:    Rate and Rhythm: Normal rate.    Pulses: Normal pulses. Abdominal:    Palpations: Abdomen is soft.    Tenderness: There is abdominal tenderness. Genitourinary:   Testes: Normal. Musculoskeletal:       General: Normal range of motion.    Cervical back: Normal range of motion and neck supple. Skin:   General: Skin is warm.    Capillary Refill: Capillary refill takes less than 2 seconds. Neurological:    General: No focal deficit present.    Mental Status: He is alert. Psychiatric:       Behavior: Behavior normal.  ProceduresProcedures ED COURSEPatient Reevaluation: Coree Brame Barritt is a 69 y.o.male with ho IV NSCLC, cad on plavix, dm2, htn, hld, hematuria with reassuring cystoscopy, nephrolithiasis, recent eval for chf admitted 9/13-9/16 now with urinary retention.  seen at urgent care for same and sent  home with foley.  bloody urine has continued and now having left sided abdominal pain.  Gen: alert, well-appearingNeuro: no facial asymmetry or focal deficits.  Normal strength/toneCV: S1+S2 no c/r.  symmetric radial pulsesResp: CTABAbd: soft/nd. neg murphy, neg mcburneyExt: WWP no c/cDark red urine from foleyExam otherwise as above.  Otherwise provided routine care. Vitals and available results and previous chart reviewed. Palmer AP. UA. Will dw urology prn. Pending end of shift 1500Attending Supervised: ResidentI saw and examined the patient. I agree with the findings and plan of care as documented in the resident's note. Please call for any clarifications at the number below.Ferdinand Cava MD Mobile Heartbeat [phone] out pending urology discussionRichard Lillia Abed, MDClinical Impressions as of Jun 29 1809 Hematuria, unspecified type Abdominal pain, unspecified abdominal location  ED DispositionDischarge Scherrie Bateman, MD09/20/21 1544 Edward Qualia, MDResident09/20/21 1638 Cyndia Diver, MD11/03/21 1810

## 2020-06-29 NOTE — Plan of Care
Admission Note Nursing Paul Hunter is a 69 y.o. male admitted with a chief complaint of hypoxia and hypotension. Patient arrived from  the ED.Patient is   AxOx4.  Vitals:  06/28/20 2100 06/29/20 0040 06/29/20 0446 06/29/20 0645 BP: 108/60 105/61 (!) 103/58 127/74 Pulse: (!) 55 67 87 70 Resp: 20 15 16   Temp: 98.5 ?F (36.9 ?C) 97.3 ?F (36.3 ?C) 97.6 ?F (36.4 ?C)  TempSrc: Oral Oral Oral  SpO2: 95% 95% 95%  Weight:     Oxygen therapy Oxygen TherapySpO2: 95 %Device (Oxygen Therapy): nasal cannulaO2 Flow (L/min): 2$Oxygen On/Off : Therapy continuedI have reviewed the patient's current medication orders..Comments: Pt AxOx4, VSS on 2L NC, ex brady @ times. Tele in place HR 50-60s. No c/o CP, SOB, N/V. Pt c/o pain to abdomen, tylenol and oxy given with + effect. Voids spont, cont of bladder, urinal at bedside, no BM this shift, last BM 11/1 pt reported. Pt voided 300 cc clear yellow urine, PVR 190. Pt voided 300 on second occasion, PVR done and 140. OOB assist x1 with RW (pt reports using one at home, will relay to day nurse for PT eval). IV ABX given per MAR. Blood cultures, urine, trop sent upon arrival to floor. Morning labs sent. Hourly rounding completed, bed alarm onSee flowsheets, patient education and plan of care for additional information. Plan of Care Overview/ Patient Status

## 2020-06-29 NOTE — Other
PHARMACY-ASSISTED MEDICATION REPORT    Pharmacist review of the best possible medication history obtained by the pharmacy medication history technician has been performed.      I have updated the home medication list and identified the following information that may be relevant to this admission.      NOTES/RECOMMENDATIONS   None at this time.                     Prior to Admission Medications     Medication Name Sig Taking? Patient Reported       albuterol sulfate 90 mcg/actuation HFA aerosol inhaler  Last dose:  --  Inhale 2 puffs into the lungs every 6 (six) hours as needed for wheezing or shortness of breath. Yes         aspirin 81 MG EC tablet  Last dose:  --  Take 81 mg by mouth daily. Yes Yes       atorvastatin (LIPITOR) 80 mg tablet  Last dose:  --  Take 1 tablet (80 mg total) by mouth nightly. Yes         belladonna alkaloids-opium (B&O SUPPRETTES) 16.2-30 mg suppository  Last dose:  --   Last Medication Note: >> Florentina Jenny Jun 28, 2020  4:08 PM     Entered by Ellard Artis, Amarylis, CPHT Tue Jun 28, 2020 1608 Place 1 suppository rectally 2 (two) times daily as needed for bladder spasm.           calcium carbonate-vitamin D3 500 mg(1,250mg ) -400 unit per tablet  Last dose:  --  Take 1 tablet by mouth 2 (two) times daily. Yes         carboxymethylcellulose (REFRESH PLUS) 0.5 % ophthalmic solution in dropperette  Last dose:  --  Place 1 drop into both eyes 3 (three) times daily as needed. Yes Yes       clopidogreL (PLAVIX) 75 mg tablet  Last dose:  --  Take 75 mg by mouth daily. Yes Yes       escitalopram oxalate (LEXAPRO) 10 mg tablet  Last dose:  --  Take 1 tablet (10 mg total) by mouth nightly. Yes         fexofenadine (ALLEGRA) 180 mg tablet  Last dose:  --   Last Medication Note: >> Cristina Gong   AVW May 28, 2020  3:32 PM     Entered by Cristina Gong, CPHT Sat May 28, 2020 1532 Take 180 mg by mouth daily as needed.  Yes Yes       furosemide (LASIX) 40 mg tablet  Last dose:  -- Take 40 mg by mouth daily. Yes Yes       gabapentin (NEURONTIN) 300 mg capsule  Last dose:  --  Take 2 capsules (600 mg total) by mouth nightly. Yes         lisinopriL (PRINIVIL,ZESTRIL) 2.5 mg tablet  Last dose:  --  Take 1 tablet (2.5 mg total) by mouth daily. Hold until follow-up with Cardiology Yes         LORazepam (ATIVAN) 0.5 mg tablet  Last dose:  --   Last Medication Note: >> LU, Calton Golds Jun 02, 2020  9:25 AM     Entered by Benetta Spar, MD Thu Jun 02, 2020 0925 Take 1 tablet (0.5 mg total) by mouth every 6 (six) hours as needed for anxiety. Yes         metoprolol succinate XL (TOPROL-XL)  25 mg 24 hr tablet  Last dose:  --  Take 0.5 tablets (12.5 mg total) by mouth daily. Hold until follow-up with Cardiology Yes         mycophenolate mofetil (CELLCEPT) 500 mg tablet  Last dose:  --  Take 2 tablets (1 g total) by mouth 2 (two) times daily. Yes         nicotine (NICODERM CQ) 21 mg transdermal patch  Last dose:  --   Last Medication Note: >> Doloris Hall   Tue Jun 28, 2020  4:10 PM     Entered by Ellard Artis, Amarylis, CPHT Tue Jun 28, 2020 1610 APPLY 1 PATCH DAILY. ROTATE PATCH SITES. LEAVE ON FOR 24 HOURS UNLESS OTHERWISE DIRECTED. Yes Yes       nicotine polacrilex (COMMIT) 2 mg lozenge  Last dose:  --   Last Medication Note: >> Doloris Hall   Tue Jun 28, 2020  4:10 PM     Entered by Ellard Artis, Amarylis, CPHT Tue Jun 28, 2020 1610 Place 1 lozenge (2 mg total) inside cheek every 2 (two) hours as needed for smoking cessation. Yes         oxybutynin XL (DITROPAN-XL) 5 mg 24 hr tablet  Last dose:  --  Take 1 tablet (5 mg total) by mouth daily. Yes         oxyCODONE (OXYCONTIN) 10 mg 12 hr extended release tablet  Last dose:  --  Take 1 tablet (10 mg total) by mouth every 12 (twelve) hours. Yes         oxyCODONE (ROXICODONE) 5 mg Immediate Release tablet  Last dose:  --  Take 1 tablet (5 mg total) by mouth every 3 (three) hours as needed for up to 10 doses. Yes         pantoprazole (PROTONIX) 40 mg tablet  Last dose:  --  Take 1 tablet (40 mg total) by mouth daily. Yes         polyethylene glycol (MIRALAX) 17 gram packet  Last dose:  --  Take 1 packet (17 g total) by mouth daily as needed for constipation. Mix in 8 ounces of water, juice, soda, coffee or tea prior to taking. Yes         predniSONE (DELTASONE) 10 mg tablet  Last dose:  --   Last Medication Note: >> Florentina Jenny Jun 28, 2020  4:12 PM  [facility](Amarylis Ellard Artis, CPHT): sister reports 100 mg daily ( it wasn't tapered to 90 mg daily)  Entered by Ellard Artis, Amarylis, CPHT Tue Jun 28, 2020 1612 Take 3 tablets (30 mg total) by mouth daily for 7 days, THEN 2 tablets (20 mg total) daily for 7 days, THEN 1 tablet (10 mg total) daily for 7 days, THEN 0.5 tablets (5 mg total) daily for 7 days, THEN 0.5 tablets (5 mg total) daily for 7 days. Take with food.. Yes       Discontinued: 06/28/2020  4:23 PM  Last dose:  --   Last Medication Note: >> Doloris Hall   Tue Jun 28, 2020  4:13 PM  [facility] Tech(Amarylis Ellard Artis, CPHT): FLAG FOR REMOVAL -  Duplicate prescriptions (same dose/sig), please delete and utilize newer prescription in list  Entered by Ellard Artis, Charolette Forward, CPHT Tue Jun 28, 2020 1610         Discontinued: 06/28/2020  4:22 PM  Last dose:  --   Last Medication Note: >> [facility]  Rosalio Loud Jun 28, 2020  4:13 PM  [employer] Tech(Amarylis Ellard Artis, CPHT): FLAG FOR REMOVAL -  patient's LIP had changed the dose/direction of the medication.  Please remove and utilize new entry  Entered by Ellard Artis, Charolette Forward, CPHT Tue Jun 28, 2020 1613           prochlorperazine (COMPAZINE) 10 mg tablet  Last dose:  --  Take 1 tablet (10 mg total) by mouth every 6 (six) hours as needed for nausea. Yes         senna-docusate (SENNA-PLUS) 8.6-50 mg tablet  Last dose:  --   Last Medication Note: >> LU, Calton Golds Jun 02, 2020  9:27 AM     Entered by Benetta Spar, MD Thu Jun 02, 2020 0927 Take 1 tablet by mouth nightly. Yes         simethicone (MYLICON) 80 mg chewable tablet  Last dose:  --   Last Medication Note: >> LU, BENJAMIN   Thu Jun 02, 2020  9:29 AM     Entered by Benetta Spar, MD Thu Jun 02, 2020 0929 Take 1 tablet (80 mg total) by mouth every 6 (six) hours as needed for flatulence. Yes         sulfamethoxazole-trimethoprim (BACTRIM DS;CO-TRIMOXAZOLE DS) 800-160 mg per tablet  Last dose:  --   Last Medication Note: >> MULINSKI, TINA M.   Fri Jun 24, 2020  9:02 AM     Entered by Jeanelle Malling., APRN Caleen Essex Jun 24, 2020 1610 Take 1 tablet by mouth Every Monday, Wednesday, and Friday. Yes         tamsulosin (FLOMAX) 0.4 mg 24 hr capsule  Last dose:  --  Take 1 capsule (0.4 mg total) by mouth nightly. Yes         torsemide (DEMADEX) 20 mg tablet  Last dose:  --  Take 1 tablet (20 mg total) by mouth daily. Yes         walker Misc  Last dose:  --  Use as directed.           Prior to admission medications last reviewed by Link Snuffer, MD on Tue Jun 28, 2020 1619         Thank you,  Theodosia Quay, PharmD  06/28/2020  10:50 PM  Phone: [facility]

## 2020-06-29 NOTE — Other
-    CONSULT  REQUEST  DOCUMENTATION  -  [facility] NOTE  -  Type of consult: [facility] Ophthalmology   -  New Consult: [id number] [name] /Location: 6822/6822-B / Brief Clinical Question: left eye blurry vision with red spot, hx of metastatic cancer/Callback Cell Phone: [phone] / Please confirm receipt of this message by texting back ?OK?  -  2 - Smartweb Page sent to Ophthalmology [facility] [phone] at 3:27 PM.  -  Alycia Rossetti  06/29/2020  3:26 PM  Consult [facility] [phone]

## 2020-06-29 NOTE — Other
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  -  1 - Mobile Heartbeat message sent to Carney Bern, Perry at 11:47 AM. Received response at 1148.  Alycia Rossetti  06/29/2020  11:47 AM  Consult [facility] [phone]

## 2020-06-29 NOTE — Progress Notes
[facility]	 [facility]	Medicine Progress NoteHospitalist ServiceAttending Provider: Katy Apo, MD [phone]:  [id number] Day #1 Subjective: Patient seen and examined on 06/29/20 in AM.Interim History: EMR reviewed. Patient resting eyes upon initial entry into room though easily awakened to voice. Patient denies any pain or shortness of breath currently, remains on 2L NC O2. Patient without any complaints. Later in afternoon notified by RN that patient complaining of new blurry vision at left eye and red spot noted at white of eye. Review of Allergies/Meds/Hx: I have reviewed the patient's: allergies, current scheduled medications, current infusions and current prn medicationsAbx: doxycycline 100 mg every 12 hours, Zosyn 3.375 g IV every 6 hours, Vancomycin placeholder to be dosed by Pharmacy Objective: Vitals:Temp:  [97.3 ?F (36.3 ?C)-98.5 ?F (36.9 ?C)] 97.8 ?F (36.6 ?C)Pulse:  [55-87] 78Resp:  [15-21] 19BP: (93-127)/(54-82) 111/73SpO2:  [92 %-95 %] 95 %Device (Oxygen Therapy): nasal cannulaO2 Flow (L/min):  [2] 2I/O's:I/O last 3 completed shifts:In: - Out: 720 [Urine:720]I/O this shift:In: - Out: 350 [Urine:350]Physical Exam General:  Resting eyes upon initial entry into room though easily awakened to voice and answering questions appropriately, lying in bed in NADHEENT:  ATPulm:  Diminished breath sounds diffusely Cardiac:  RRR, S1S2 notedGI:  Soft, N/T, N/D, BS normoactiveExtremities:  Trace LE pedal edema, LEs N/TSkin:  No rashes notedI have reviewed new labs and imaging over the last 24 hoursLabs:Recent Labs Lab 10/28/210623 10/28/210754 11/02/211133 11/03/210704 NA 140  --  137 138 K 4.2  --  4.4 3.8 CL 101  --  98 100 CO2 28  --  24 27 BUN 38*  --  44* 46* CREATININE 1.68*  --  2.36* 2.38* GLU 106* 109* 148* 95 CALCIUM 9.0  -- 8.5* 8.5* MG 2.3  --   --   --  Recent Labs Lab 11/02/211133 11/03/210704 WBC 36.4* 32.0* HGB 10.5* 10.1* HCT 31.4* 29.7* PLT 231 214 MCV 93.7 95.5* NEUTROPHILS 92.5*  --  MONOCYTES 1.4* 2.0* Recent Labs Lab 11/02/211133 LABPROT 10.9 INR 1.00 Recent Labs Lab 10/28/210623 11/02/211441 11/02/211910 11/02/212158 TROPONINT 0.07* 0.05* 0.04* 0.03* Diagnostics:CXR (06/28/20):FINDINGS:?A port projects over the right chest wall with the distal tip tip terminating over the superior vena cava.?The cardiomediastinal silhouette is stably enlarged.?There is prominence of the perihilar and interstitial vascular markings as well as innumerable diffuse metastatic lung nodules that appear similar compared to recent imaging.?Nodules are better evaluated on recent CTA from June 17, 2020.?No pneumothorax or significant pleural effusion.?There is no acute displaced osseous injury.?IMPRESSION:?Similar appearance of cardiomegaly and central vascular congestion as well as interstitial prominence suggesting pulmonary edema, superimposed on a background of COPD and innumerable lung metastases. A superimposed infectious process would be difficult to exclude radiographically in this setting and should be correlated for clinically.???Reported And Signed By: Lavinia Sharps, MD  [facility] Radiology and Biomedical ImagingECG/Tele Events: I have reviewed the patient's ECG as resulted in the EMR. Tele: Sinus dysrhythmia with PVCs and HR in the 50s at times Assessment: 69 y.o. 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 Noted to have elevated leukocytosis, procal as well as trop and probnp. CXR suggestive of pulmonary edema, superimposed on a background of COPD and innumerable lung metastases and possible PNA. COVID negative. Acute on hypoxemic respiratory failure  felt to be multifactorial d/t pulmonary edema, progression of pulmonary mets, and superimposed PNA.Plan: 1. Hypoxemic respiratory failure - Felt to be multifactorial d/t pulmonary edema, progression of pulmonary mets, and superimposed PNA- Continue doxycycline/Zosyn/Vancomycin as above- Cardiology Oncology consulted, involving Endocrinology and Renal per prelim verbal discussion. Holding off on additional diuresis for now, ordered PRN straight cath if patient starts to retain >400 cc on bladder scan when checked for PVR.- Troponin T 0.05-->0.04-->0.03- Monitoring on telemetry- Monitor strict I/Os and daily weights- Wean O2 as tolerated- Reviewed recent ECHO on 10/22- Smoking cessation provision 2. Pembrolizumab associated myocarditis- Continue prednisone 100 mg pending further consultant recs as above, seems was being tapered over the past few weeks by Cardiology Oncology3. AKI on CKD- Holding ACE, metoprolol, Bactrim for now - Monitor bladder scans for PVRs and straight cath PRN for >400 cc- Obtaining urine lytes/studies and Renal US per prelim discussion with Renal 4. Leukocytosis in setting of presumed PNA and prednisone- Blood cx showing NGTD- Continue abx and holding BB as above5. Metastatic urothelial cancer with mets to lung and recent [place] showing progression of disease- Follow-up with Oncology for treatment plan- Continue long-acting oxycodone at home dosing and hold off on short-acting oxycodone currently as patient denying pain at present6. New blurry vision at left eye and red spot noted- Will consult Ophthalmology for eval 7. F/E/N- Continue Cardiac Diet DISPO: Pending medically stable Full Code/ACLSDVT Prophylaxis: on heparin SC - Med Reconciliation completed by Pharmacist Signed:Erika Baranowski PA-C Please contact via Mobile HeartbeatAfter 16:30, please contact covering provider or provider assigned to the dynamic role of [facility] Hospitalist Coverage or [facility] Hospitalist Coverage.2:53 PMAttending StatementI have seen/evaluated the patient and agree with the history, physical exam and plan as documented by the PA with the following modifications and/or additions: Pt seen and examined, discussed with PA, cardiology, endocrinology and nephrology.Pt reports respiratory status improved today, although only minimal UOP to lasix yesterday. Denies cough, congestion or F/C to suggest infection.Exam notable for clear lungs posteriorly on my exam, without rales or wheezes. No edema. WWP. +Myoclonus and asterixis noted. Otherwise non-focal (did not c/o eye blurriness at my visit earlier today).Labs and tele reviewed. Leukocytosis difficult to interpret in setting of high dose prednisone. Procal mildly elevated but also in setting of AKI. Cr relatively unchanged.Tele with high PVC burden notedPlan as above:- appreciate cardiology/cardio-onc input- repeat TTE, hold diuresis pending recs- add back toprol 25mg  for PVCs per discussion with Dr. Bryson Ha this afternoon- await renal US, appreciate nephrology input- rising bladder scan PVRs noted, monitor for now- check free T4, no further pituitary axis w/u indicated currently per endo- cont high dose steroids for now; will need to address PJP ppx prior to discharge pending renal function- consider holding abx given no clear e/o infection- appreciate ophtho input; may need to consider MRI brain if no corneal abrasion/lesion to explain vision symptom- will reach out to Dr. Kathrynn Ducking to try to updateFurther plan as above.Electronically Signed:Tymeer Vaquera J Tome Wilson, MD11/3/20213:51 PM

## 2020-06-29 NOTE — Other
[facility]    [facility]     Nephrology Consult Note      Reason for Consult: AKI    Consultation Requested By: Hospitalist      HPI:  69 y.o. male with PMHx of metastatic urothelial cancer (dx in 06/2019 after p/w DOE, s/p 4 cycles of carbo/Taxol/Pembro. 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    He transitioned his care from [place] to [place] this past year, started on?Carbo/Taxol/Pembro 11/28/19?s/p 2 cycles, and 2 more cycles at [facility] most recent 03/10/20. ?He was seen for initial consult by CardioOncology 04/08/2020.   A cardiac MRI was done 04/19/2020 and was consistent with ICI myocarditis, started on steroids      09/2021admission for hematuria which led to the discovery of a 3 by 4 cm papillary tumor with pathology consistent with urothelial carcinoma and subsequent RLL FNA/wash positive for malignant cells of similar morphology to bladder cancer for overall willingness see felt to be minute spent static carcinoma with urothelial primary.     10/2 patient had a [place] abdomen and pelvis which revealed left renal atrophy with mild hydronephrosis.  Right kidney was unremarkable.  Note was also made of stable left periaortic lymph node of 1.2 cm the level of the kidneys. Mild wall thickening of the bladder base with non visualization of the previously seen bladder tumor.    Patient had a recent admission from 10/21-10/28 for lower extremity edema and sudden-onset severe dyspnea, patient's [place] PE on 10/22 was negative for PE but did reveal progression of pulmonary mets with note made of enlarged pulmonary artery.  In the setting of volume overload he was started on IV diuretics with improvement dyspnea however it is notable that patient's creatinine went from 1.6-->1.9 in addition to reports of intermittent hypotension in the setting of probable over-diuresis, improved back down to 1.68 at time of discharge. Other noteable findings during this admission include a syncopal episode after a bowel movement on 10/27. An echo on 10/22 revealed LVEF of 55-60%, dilated RV with mildy decreased function, normal sized IVC. A cardiac MRI on 10/26 revealed ongoing inflammatory edema therefore was started on mycophenolate 1 g b.i.d. in addition to high-dose steroids.     His home medications were notable for torsemide 20 mg daily, belladonna suppository (hasn't taken in a while), tamsulosin 0.4 mg, Bactrim, lisinopril 2.5 mg, and prednisone     On admission yesterday, borderline hypotension at 100/50s (one dip to 93/59) weight is essentially stable from discharge, creatinine 2.38, elevated leukocytosis, positive troponin, positive proBNP with a chest x-ray suggestive of pulmonary edema in addition to innumerable lung metastasis and possible pneumonia. Initial UA last night only revealing for trace protein, however repeat urinalysis today reveals 1+ protein, trace blood, positive leukocytes, 14 WBC/HPF with 290 mg U PCR, Ur Na of 50, Ur Cr 143 (0.6% pre-renal).     Upon speaking the patient he does report several weeks of lower urinary tract symptoms with difficulty urinating, sometimes ?in a dribble.  He also reports not eating well and feeling dehydrated.  Lower extremity edema is much improved per his report      PMHx: @PMHPN @  SocHx: A[place]on reports that he has been smoking  cigarettes. He does not have any smokeless tobacco history on file.  FamHx: family history is not on file.  Allergies:  Patient has no known allergies.    Medications:  Scheduled Meds:  Current Facility-Administered Medications   Medication Dose Route Frequency Provider Last Rate Last Admin   ? aspirin EC delayed release tablet 81 mg  81 mg Oral Daily Axel Filler I, MD   81 mg at 06/29/20 1006   ? cetirizine (ZyrTEC) tablet 5 mg  5 mg Oral Daily Adekolu, [name], MD   5 mg at 06/29/20 1007   ? clopidogreL (PLAVIX) tablet 75 mg  75 mg Oral Daily Axel Filler I, MD   75 mg at 06/29/20 1008   ? doxycycline TAB/CAP 100 mg  100 mg Oral Q12H Axel Filler I, MD   100 mg at 06/29/20 1009   ? escitalopram oxalate (LEXAPRO) tablet 10 mg  10 mg Oral Nightly Axel Filler I, MD   10 mg at 06/28/20 2149   ? gabapentin (NEURONTIN) capsule 600 mg  600 mg Oral Nightly Axel Filler I, MD   600 mg at 06/28/20 2149   ? heparin (porcine) injection 5,000 Units  5,000 Units Subcutaneous Q8H Axel Filler I, MD   5,000 Units at 06/29/20 1435   ? ipratropium-albuteroL (DUO-NEB) 0.5 mg-3 mg(2.5 mg base)/3 mL nebulizer solution 3 mL  3 mL Nebulization Q6H WA Axel Filler I, MD   3 mL at 06/29/20 1405   ? metoprolol succinate XL (TOPROL-XL) 24 hr tablet 25 mg  25 mg Oral Daily Advani, [name], MD       ? mycophenolate mofetil (CELLCEPT) tablet 1 g  1 g Oral BID Axel Filler I, MD   1 g at 06/29/20 1610   ? oxybutynin XL (DITROPAN-XL) 24 hr tablet 5 mg  5 mg Oral Daily Adekolu, [name], MD   5 mg at 06/29/20 1009   ? oxyCODONE (OxyCONTIN) 12 hr tablet 10 mg  10 mg Oral Q12H Axel Filler I, MD   10 mg at 06/29/20 9604   ? pantoprazole (PROTONIX) EC tablet 40 mg  40 mg Oral Daily Axel Filler I, MD   40 mg at 06/29/20 1010   ? piperacillin-tazobactam (ZOSYN) 3.375 g in sodium chloride 0.9% 50 mL (mini-bag plus)  3.375 g Intravenous Q6H Adekolu, [name], MD 16.67 mL/hr at 06/29/20 1200 3.375 g at 06/29/20 1200   ? predniSONE (DELTASONE) tablet 100 mg  100 mg Oral Daily Axel Filler I, MD   100 mg at 06/29/20 1010   ? rosuvastatin (CRESTOR) tablet 40 mg  40 mg Oral Daily Adekolu, [name], MD   40 mg at 06/29/20 1010   ? sodium chloride 0.9 % flush 3 mL  3 mL IV Push Q8H Axel Filler I, MD   3 mL at 06/29/20 0648   ? tamsulosin (FLOMAX) 24 hr capsule 0.4 mg  0.4 mg Oral Nightly Adekolu, [name], MD   0.4 mg at 06/28/20 2150   ? Vancomycin MAR Level   Intravenous Once Advani, [name], MD       ? Vancomycin Therapy Placeholder   Intravenous placeholder Adekolu, Lum Babe, MD         Continuous Infusions:  PRN Meds:.acetaminophen, LORazepam, polyethylene glycol, prochlorperazine, simethicone, sodium chloride    Review of Systems   Constitutional: Negative for unexpected weight change.   HENT: Negative for nosebleeds, sore throat, facial swelling, mouth sores.   Respiratory: Negative for sinus congestion, cough,  chest tightness.  Cardiovascular: Negative for chest pain, palpitations and leg swelling.   Gastrointestinal: Negative for abdominal pain, diarrhea and constipation.   Genitourinary: Negative for dysuria, frequency, hematuria, decreased urine volume and difficulty urinating. Urge to Void.   Musculoskeletal: Negative for arthralgias, myalgias.   Skin: Negative for rash/pruritis.   Neurological: Negative for weakness, light-headedness and headaches.   Hematological: Negative for adenopathy.   Psychiatric/Behavioral: Negative for dysphoric mood, insomnia.     PE:  VS: Temp:  [97.3 ?F (36.3 ?C)-98.5 ?F (36.9 ?C)] 97.8 ?F (36.6 ?C)  Pulse:  [55-87] 78  Resp:  [15-21] 19  BP: (93-127)/(54-74) 111/73  SpO2:  [92 %-95 %] 95 %  Weight change:   SpO2:  [92 %-95 %] 95 % (11/03 1240)  Ins//Outs: I/O last 3 completed shifts:  In: -   Out: 1070 [Urine:1070]    Gen: AOx3, tearful at times.   HEENT: No sunken eyes, MMM  Neck: No JVD  CV: distant heart sounds, but RRR, nls1s2, no murmurs or friction rubs  Resp: CTAB, anterior and lateral, difficult to patient to lean forward.   Abd: soft, nontender, nondistended, nonpalpable bladder.   Skin/Ext: 1+ pedal edema    LABS:  Lab Results   Component Value Date    WBC 32.0 (H) 06/29/2020    HGB 10.1 (L) 06/29/2020    HCT 29.7 (L) 06/29/2020    PLT 214 06/29/2020    NA 138 06/29/2020    K 3.8 06/29/2020    CL 100 06/29/2020    CO2 27 06/29/2020    BUN 46 (H) 06/29/2020    CREATININE 2.38 (H) 06/29/2020    GLU 95 06/29/2020    CALCIUM 8.5 (L) 06/29/2020    PHOS 3.6 06/20/2020     MICROBIOLOGY:  reviewed    RADIOLOGY:  No results found.    Assessment:  69 y.o. male with PMHx of metastatic urothelial cancer (dx in 06/2019 after p/w DOE, s/p 4 cycles of carbo/Taxol/Pembro. 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    Pre renal azotemia likely given hx of poor PO intake, exposure to diuretics, and low FeNa.   Post obstruction considered in the setting of lymphadenopathy at the level of the kidney in addition to previous bladder cancer and his urinary symptoms.   ICPI related AIN seemed unlikely because this would have developed setting of being treated with high-dose steroids for his myocarditis, that being said pyuria and progressive worsening of renal function since early October are noted.   PPI related AIN merits consideration as this was a new med started during his 10/1-10/4 admission, but again AIN would have had to develop in the setting of high dose steroids.   Note that his AKI is all in the setting of a  poorly/nonfunctioning left kidney (atrophic on recent imaging, unclear etiology)  Less likely cardiorenal syndrome (venous congestion) given improvement in lower extremity edema stable weights but will await formal read on echo to ensure no gross abnormalities    Renal Plan:  1. AKI:  --Renal U/S today  --would trial 500-1000cc of NS tonight  --would send CK in the setting of reported weakness.   --would avoid diuretics for now  --agree with holding lisinopril,  bactrim  --will spin when able (unable to provide specimen today)  --daily weights    Discussed with Attending    Emelda Brothers  06/29/2020 3:57 PM    After Hours and on Weekends call the Renal Fellow on Call (see AMION)  Preferred Contact: Mobile Heartbeat

## 2020-06-30 ENCOUNTER — Encounter: Admit: 2020-06-30 | Payer: PRIVATE HEALTH INSURANCE | Attending: Adult Health

## 2020-06-30 ENCOUNTER — Ambulatory Visit: Admit: 2020-06-30 | Payer: PRIVATE HEALTH INSURANCE | Attending: Urology

## 2020-06-30 LAB — T4, FREE: BKR FREE T4: 0.82 ng/dL

## 2020-06-30 LAB — CBC WITH AUTO DIFFERENTIAL
BKR WAM ABSOLUTE IMMATURE GRANULOCYTES: 0.7 x 1000/??L — ABNORMAL HIGH (ref 0.0–0.3)
BKR WAM ABSOLUTE LYMPHOCYTE COUNT: 1.4 x 1000/??L (ref 1.0–4.0)
BKR WAM ABSOLUTE NRBC: 0 x 1000/??L (ref 0.0–0.0)
BKR WAM ANALYZER ANC: 23.9 x 1000/??L — ABNORMAL HIGH (ref 1.0–11.0)
BKR WAM BASOPHIL ABSOLUTE COUNT: 0.1 x 1000/??L — ABNORMAL HIGH (ref 0.0–0.0)
BKR WAM BASOPHILS: 0.3 % (ref 0.0–4.0)
BKR WAM EOSINOPHIL ABSOLUTE COUNT: 0 x 1000/??L (ref 0.0–1.0)
BKR WAM EOSINOPHILS: 0 % (ref 0.0–7.0)
BKR WAM HEMATOCRIT: 30.6 % — ABNORMAL LOW (ref 37.0–52.0)
BKR WAM HEMOGLOBIN: 9.9 g/dL — ABNORMAL LOW (ref 12.0–18.0)
BKR WAM IMMATURE GRANULOCYTES: 2.5 % (ref 0.0–3.0)
BKR WAM LYMPHOCYTES: 5.3 % — ABNORMAL LOW (ref 8.0–49.0)
BKR WAM MCH (PG): 31 pg (ref 27.0–31.0)
BKR WAM MCHC: 32.4 g/dL — ABNORMAL HIGH (ref 31.0–36.0)
BKR WAM MCV: 95.9 fL — ABNORMAL HIGH (ref 78.0–94.0)
BKR WAM MONOCYTE ABSOLUTE COUNT: 0.6 x 1000/??L (ref 0.0–2.0)
BKR WAM MONOCYTES: 2.1 % — ABNORMAL LOW (ref 4.0–15.0)
BKR WAM MPV: 9.9 fL (ref 6.0–11.0)
BKR WAM NEUTROPHILS: 89.8 % — ABNORMAL HIGH (ref 37.0–84.0)
BKR WAM NUCLEATED RED BLOOD CELLS: 0 % (ref 0.0–1.0)
BKR WAM PLATELETS: 181 x1000/??L (ref 140–440)
BKR WAM RDW-CV: 15.2 % — ABNORMAL HIGH (ref 11.5–14.5)
BKR WAM RED BLOOD CELL COUNT: 3.2 M/??L — ABNORMAL LOW (ref 3.8–5.9)
BKR WAM WHITE BLOOD CELL COUNT: 0 % (ref 0.0–1.0)
BKR WAM WHITE BLOOD CELL COUNT: 26.6 x1000/??L — ABNORMAL HIGH (ref 4.0–10.0)

## 2020-06-30 LAB — BASIC METABOLIC PANEL
BKR ANION GAP: 10 g/dL — ABNORMAL LOW (ref 7–17)
BKR BLOOD UREA NITROGEN: 38 mg/dL — ABNORMAL HIGH (ref 8–23)
BKR BUN / CREAT RATIO: 21.1 (ref 8.0–23.0)
BKR CALCIUM: 8.5 mg/dL — ABNORMAL LOW (ref 8.8–10.2)
BKR CHLORIDE: 105 mmol/L (ref 98–107)
BKR CO2: 27 mmol/L (ref 20–30)
BKR CREATININE: 1.8 mg/dL — ABNORMAL HIGH (ref 0.40–1.30)
BKR EGFR (AFR AMER): 46 mL/min/1.73m2 (ref >60)
BKR EGFR (NON AFRICAN AMERICAN): 38 mL/min/1.73m2 — ABNORMAL HIGH (ref >60)
BKR GLUCOSE: 108 mg/dL — ABNORMAL HIGH (ref 70–100)
BKR POTASSIUM: 4.6 mmol/L (ref 3.3–5.3)
BKR SODIUM: 142 mmol/L (ref 136–144)

## 2020-06-30 LAB — CK     (BH GH L LMW YH): BKR CREATINE KINASE TOTAL: 22 U/L (ref 11–204)

## 2020-06-30 LAB — MAGNESIUM: BKR MAGNESIUM: 2.4 mg/dL (ref 1.7–2.4)

## 2020-06-30 LAB — TROPONIN T     (Q): BKR TROPONIN T: 0.02 ng/mL — ABNORMAL HIGH (ref <0.01)

## 2020-06-30 LAB — MRSA CULTURE     (BH GH LM YH): BKR MRSA MEDIA: NEGATIVE

## 2020-06-30 MED ORDER — CYCLOPENTOLATE 1 % EYE DROPS
1 % | Freq: Two times a day (BID) | OPHTHALMIC | Status: DC
Start: 2020-06-30 — End: 2020-07-01
  Administered 2020-06-30 – 2020-07-01 (×3): 1 mL via OPHTHALMIC

## 2020-06-30 MED ORDER — CARBOXYMETHYLCELLULOSE SODIUM 0.5 % EYE DROPS IN A DROPPERETTE
0.5 % | Freq: Three times a day (TID) | OPHTHALMIC | Status: DC | PRN
Start: 2020-06-30 — End: 2020-07-07

## 2020-06-30 MED ORDER — GABAPENTIN 300 MG CAPSULE
300 mg | Freq: Every evening | ORAL | Status: DC
Start: 2020-06-30 — End: 2020-07-07
  Administered 2020-07-01 – 2020-07-05 (×5): 300 mg via ORAL

## 2020-06-30 MED ORDER — PREDNISOLONE ACETATE 1 % EYE DROPS,SUSPENSION
1 % | Freq: Four times a day (QID) | OPHTHALMIC | Status: DC
Start: 2020-06-30 — End: 2020-07-01
  Administered 2020-06-30 – 2020-07-01 (×4): 1 mL via OPHTHALMIC

## 2020-06-30 MED ORDER — OXYCODONE IMMEDIATE RELEASE 5 MG TABLET
5 mg | Freq: Once | ORAL | Status: CP
Start: 2020-06-30 — End: ?
  Administered 2020-06-30: 17:00:00 5 mg via ORAL

## 2020-06-30 MED ORDER — SODIUM CHLORIDE 0.9 % INTRAVENOUS SOLUTION
INTRAVENOUS | Status: DC
Start: 2020-06-30 — End: 2020-07-01
  Administered 2020-06-30: 19:00:00 via INTRAVENOUS

## 2020-06-30 MED ORDER — OXYCODONE IMMEDIATE RELEASE 5 MG TABLET
5 mg | Freq: Four times a day (QID) | ORAL | Status: DC | PRN
Start: 2020-06-30 — End: 2020-07-04
  Administered 2020-07-01 – 2020-07-04 (×4): 5 mg via ORAL

## 2020-06-30 MED ORDER — CARBOXYMETHYLCELLULOSE SODIUM 0.5 % EYE DROPS IN A DROPPERETTE
0.5 % | Freq: Four times a day (QID) | OPHTHALMIC | Status: DC
Start: 2020-06-30 — End: 2020-07-07
  Administered 2020-06-30 – 2020-07-06 (×16): 0.5 % via OPHTHALMIC

## 2020-06-30 NOTE — Other
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 Palliative care was consulted to assist with symptom management.     Met with patient at bedside this afternoon.  He is alert, pleasant, and engaged in conversation.  Re- Introduced role of palliative care.  He was thankful for additional support. His family friend was at bedside, who he feels is more like family.      He has a good understanding of his illness.  He verbalized that he has Stage 4 cancer. He follows with Dr. Kathrynn Ducking, he was supposed to get treatment today he believes but it is being held in light of his hospitalization.  He was also supposed to follow with palliative care as outpatient which he would still like to do.  He becomes tearful in saying if I choose to go to hospice I am not going to come out.  For now he is hopeful to continue to pursue treatment if possible.   He reports ongoing distress over his diagnosis and thoughts of end of life.  He shares that he relies on family and cries to get through his sadness.  He does find benefit from Lexapro.  Validated concerns, provided active listening, and he was thankful.     He reports new worsening pain to the L upper abdomen that wraps around to his back.  Pain is dull intermittent, but severe at times.  He also has ongoing chronic pain which he has been dealing with for years  from scoliosis in his back. At home he was on oxycontin 10mg  PO BID with oxycodone 5mg  PRN.  He finds this regimen makes pain tolerable. This was prescribed by Dr. Kathrynn Ducking.  He has SOB with exertion, he is requiring oxygen and frequent rest.  He has had increased fatigue and lethargy, per RN sleeping most of the day.  He was awake and alert during our visit today.  His appetite is poor.  He denies any N/V.  He is having BM every other day, last BM was yesterday.   His sleep waxes and wanes sometimes he sleeps well other times not so much.    From previous palliative care encounter:  He has completed an HCR and living will.  His sister in law [name] is his HCR. He is working with a Clinical research associate to get his finances in order.  He is planning his own funeral with the [place].  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  He enjoys spending time with his family, eating out, listening to music.  He is Catholic and feels he has made peace with God.    ?  Palliative Care Review of Systems  Pain: L sided abdominal pain radiating around to back, responsive to oxycontin  Dyspnea:yes  Cough: no  Nausea/Vomiting: no  Change in appetite: poor  Constipation: no  Fatigue: yes  Insomnia:no  Drowsiness : no  Anxiety/Depression: yes  Concerns/Worries:  Next steps, dying    General Review of  Systems  Review of Systems   Constitutional:        Chronically ill appearing gentleman sitting at side of bed   HENT: Negative.    Eyes: Negative.    Respiratory: Positive for shortness of breath.    Cardiovascular: Negative.    Gastrointestinal: Positive for abdominal pain.   Musculoskeletal: Positive for back pain.   Skin: Negative.    Neurological: Positive for weakness.   Psychiatric/Behavioral: Positive for depression. The patient is nervous/anxious.      Review of Allergies/Hx/Meds   Allergies  Patient has No Known Allergies.    Medical History  Past Medical History:   Diagnosis Date   ? Anxiety    ? CAD (coronary artery disease)    ? Diabetes mellitus (HC Code) (HC CODE)    ? Difficult intubation 05/22/2020    Patient with significant soft tissue in oropharynx. Glidescope intubation revealed anterior cords with floppy soft tissue obstruction. Patient will require video laryngoscopy for airway management going forward.   ? GERD (gastroesophageal reflux disease)    ? Hyperlipidemia    ? Hypertension    ? Hypertrophy of prostate without urinary obstruction and other lower urinary tract symptoms (LUTS)    ? Other testicular hypofunction    ? Spinal stenosis 09/28/2018   ? Tobacco abuse    ? Vitamin D deficiency        Family History  No family history on file.    Social History  Social History     Socioeconomic History   ? Marital status: Divorced     Spouse name: Not on file   ? Number of children: Not on file   ? Years of education: Not on file   ? Highest education level: Not on file   Occupational History   ? Not on file   Tobacco Use   ? Smoking status: Current Every Day Smoker     Types: Cigarettes   Substance and Sexual Activity   ? Alcohol use: Not on file   ? Drug use: Not on file   ? Sexual activity: Not on file   Other Topics Concern   ? Not on file   Social History Narrative    As of 2021: Was living?in [place]?and moved back to [place] to be near family in 2021. He moved into his niece's in-law apartment in [place] while undergoing cancer treatment. ?He is currently self sufficient and independent.    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    -[employer] coming for med management and HHA assistance    -family very involved    -he is divorced and has 1 daughter with a 36 month old who lives 30 minutes away and a son with a 54 month old in [place]        Smoking hx 41 PYH    -current 1 PPD    -meeting with smoking cessation    EtOH - not much         Social Determinants of Psychologist, prison and probation services Strain:    ? Difficulty of Paying Living Expenses:    Food Insecurity:    ? Worried About Programme researcher, broadcasting/film/video in the Last Year:    ? Barista in the Last Year:    Transportation Needs:    ? Freight forwarder (Medical):    ? Lack of Transportation (Non-Medical):    Physical  Activity:    ? Days of Exercise per Week:    ? Minutes of Exercise per Session:    Stress:    ? Feeling of Stress :    Social Connections:    ? Frequency of Communication with Friends and Family:    ? Frequency of Social Gatherings with Friends and Family:    ? Attends Religious Services:    ? Active Member of Clubs or Organizations:    ? Attends Banker Meetings:    ? Marital Status:    Intimate Partner Violence:    ? Fear of Current or Ex-Partner:    ? Emotionally Abused:    ? Physically Abused:    ? Sexually Abused:        InPatient Medications  Current Facility-Administered Medications   Medication Dose Route Frequency Last Rate   ? acetaminophen  650 mg Oral Q6H PRN     ? aspirin  81 mg Oral Daily     ? carboxymethylcellulose  1 drop Both Eyes 4x Daily     ? carboxymethylcellulose  1 drop Both Eyes TID PRN     ? cetirizine  5 mg Oral Daily     ? clopidogreL  75 mg Oral Daily     ? cyclopentolate  1 drop Left Eye BID     ? [Held by provider] doxycycline  100 mg Oral Q12H     ? escitalopram oxalate  10 mg Oral Nightly     ? gabapentin  300 mg Oral Nightly     ? heparin (porcine)  5,000 Units Subcutaneous Q8H     ? ipratropium-albuteroL  3 mL Nebulization Q6H WA     ? [Held by provider] LORazepam  0.5 mg Oral Q6H PRN     ? metoprolol succinate XL  25 mg Oral Daily     ? mycophenolate mofetil  1 g Oral BID     ? oxybutynin XL  5 mg Oral Daily     ? oxyCODONE  10 mg Oral Q12H     ? oxyCODONE  5 mg Oral Q6H PRN     ? pantoprazole  40 mg Oral Daily     ? polyethylene glycol  17 g Oral DAILY PRN     ? prednisoLONE acetate  1 drop Left Eye 4x Daily     ? predniSONE  100 mg Oral Daily     ? [Held by provider] prochlorperazine  10 mg Oral Q6H PRN     ? rosuvastatin  40 mg Oral Daily     ? simethicone  80 mg Oral Q6H PRN     ? sodium chloride  3 mL IV Push Q8H     ? sodium chloride  3 mL IV Push PRN for Line Care     ? sodium chloride  75 mL/hr Intravenous Continuous 75 mL/hr (06/30/20 1457)   ? tamsulosin  0.4 mg Oral Nightly         [email or web address]    Palliative Performance Scale 50  (Modification of Karnofsky and was designed for measurement of physical status in Palliative Care. Only 10% of patients with PPS score <=50% would be expected to survive >6 months)      Physical Exam  Constitutional:       Comments: Chronically ill appearing male sitting up on side of bed   HENT:      Head: Normocephalic.      Mouth/Throat:      Mouth: Mucous membranes are  moist.   Pulmonary:      Comments: SOB with exertion and conversation  Musculoskeletal:         General: Normal range of motion.      Cervical back: Normal range of motion.   Skin:     General: Skin is warm and dry.   Neurological:      General: No focal deficit present.      Mental Status: He is oriented to person, place, and time.   Psychiatric:      Comments: Anxious/depressed        Vitals:  Temp:  [97.8 ?F (36.6 ?C)-98.5 ?F (36.9 ?C)] 97.8 ?F (36.6 ?C)  Pulse:  [55-81] 75  Resp:  [14-20] 19  BP: (103-138)/(58-69) 129/69  SpO2:  [95 %-97 %] 95 %     Wt Readings from Last 3 Encounters:   06/30/20 128.5 kg   06/23/20 133.6 kg   06/13/20 (!) 140.6 kg     Review of Labs/Diagnostics  Complete Blood Count:  Recent Labs   Lab 06/28/20  1133 06/29/20  0704 06/30/20  0523   WBC 36.4* 32.0* 26.6*   HGB 10.5* 10.1* 9.9*   HCT 31.4* 29.7* 30.6*   PLT 231 214 181     Comprehensive Metabolic Panel:  Recent Labs   Lab 06/28/20  1133 06/28/20  1133 06/29/20  0704 06/29/20  0704 06/30/20  0523   NA 137  --  138  --  142   K 4.4  --  3.8  --  4.6   CL 98  --  100  --  105   CO2 24  --  27  --  27   BUN 44*  --  46*  --  38*   CREATININE 2.36*  --  2.38*  --  1.80*   GLU 148*   < > 95   < > 108*   CALCIUM 8.5*  --  8.5*  --  8.5*    < > = values in this interval not displayed.     No results for input(s): ALKPHOS, BILITOT, BILIDIR, PROT, ALT, AST in the last 168 hours.    Invalid input(s): LABALBU    Diagnostics:  XR Chest PA or AP    Result Date: 06/28/2020  Similar appearance of cardiomegaly and central vascular congestion as well as interstitial prominence suggesting pulmonary edema, superimposed on a background of COPD and innumerable lung metastases. A superimposed infectious process would be difficult to exclude radiographically in this setting and should be correlated for clinically. Reported And Signed By: Lavinia Sharps, MD  [facility]    US Renal    Result Date: 06/29/2020  Significantly limited examination as described above. Mild to moderate left hydronephrosis, which was also present on 05/28/2020. 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    # Generalized pain in the setting of?chronic pain, fall, metastatic urothelial cancer  - Recommend Tylenol 650mg  PO q6h PRN for generalized pain   -?  Currently on?gabapentin?300mg ?PO HS-->decreased today based on renal function   -?can consider?lidocaine patch TD 12 hours on, 12 hours off   - Currently on oxycontin 10mg  PO BID with oxycodone 5mg  PO Q6H PRN-->can continue for now, monitor renal function, and watch for lethargy or AMS.  Would hold in the setting of sedation, RR<10.  - Warm/ cool compresses as tolerated     # Dyspnea in the setting of metastatic urothelial cancer with mets to the lungs  - Recommend non-pharmacologic interventions: elevate HOB, relaxation/breathing techniques, bedside fan, balance rest with activity  - Could consider low dose opioid; oxycodone as above can aid with breathlessness  - Supplemental 02 per primary team- nasal cannula    # Fatigue in the setting of?advanced cancer?with decline in functional status  - Non pharmacologic sleep hygiene techniques including: ?keep lights on during the day and off at night, minimize overnight interruptions (lab draws, vital signs, medication administration)  - PT/ exercise as tolerated  - Energy conservation techniques during the day with periods of rest as needed   ?  # Insomnia in the setting of hospitalization, symptom burden, altered sleep/wake cycle, environmental interruptions   - Non- pharmacologic interventions: minimize daytime naps, promote good sleep hygiene, minimize nighttime interruptions, relaxation techniques   ?  # Difficulty coping in the setting of advanced disease  - Symptom management as above   - Will refer to palliative care interdisciplinary team for?ongoing support  - Currently on lexapro 10mg  PO daily   - Can consider further antianxiety agents if patient continues to not be lethargic over the coming days.     # Anorexia in the setting of advanced cancer, SOB  - Encourage small, frequent, nutrient-rich meals  - Optimize oral care  - Encourage activity as tolerated  ?  #?Palliative care encounter?in the setting of advanced cancer, decline in functional status, ongoing symptom burden   - Full code?-->not discussed on this encounter  -?HCP and living will are complete. ?His sister in law [name] is HCP and has copy of original  - Palliative care will continue to follow for ongoing symptom management and support. ?We remain available to assist with future GOC meetings as patient's condition evolves. ??  - Sent staff message to outpatient palliative care team to arrange outpatient follow-up.?    Patient was evaluated: In person    Time committed:  66  minutes>50% of time was spent in patient care/family counseling dealing with complex physical and emotional issues of symptm management and palliative care in the setting of serious and potentially lifethreatening illness.      Signed:  Josephine Igo, FNP-BC  Pallaitive Care   Can be reached via [facility]  06/30/2020  4:35 PM

## 2020-06-30 NOTE — Plan of Care
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  He reports OT came to house and he felt lightheaded and fell to the floor. OT noted hypotension of SBP 80s and oxygen of 80%.  He reports decreased appetite. Denies sick contacts.  Denies head trauma. Denies fever and chills.     Chart reviewed. Last d/c noted 06/23/20.Patient arrived from home. Patient active with SN/PT/OT. Infectious work up continues. CM team will continue to follow for additional d/c needs. Family to transport.     CM Assessment and Discharge Planning        Most Recent Value   Case Management Assessment   Do you have a caregiver? No   Patient Requires Care Coordination Intervention Due To discharge planning needs/concerns   Arrived from prior to admission home   Bed Hold  n/a   Services Prior to Admission home care agency (specify services)   Home care agency name: [email or web address]   ADL Assistance Ambulation, Bathing, Toileting, Clothing   Type of Home Care Services Nurse visit, Home PT, Home OT   Equipment Currently Used at Home cane, straight, walker, rolling, shower chair   Documented Insurance Accurate Yes  Monia [employer] MGD MCR]   Any financial concerns related to anticipated discharge needs No   Patient's home address verified Yes   Patient's PCP of record verified Yes   Last Date Seen by PCP 0-3 months   Living Environment    Lives With Other (see comments)   Current Living Arrangements home/apartment/condo   Home Accessibility no concerns, bed and bath are not on the first floor, stairs within home   Stair Railings, Main Entrance railings safe and in good condition   Number of Stairs, Within Home, Primary eleven-fourteen   Transportation Available family or friend will provide   Home Safety   Feels Safe Living In Home yes   Source of Clinical History   Patient's clinical history has been reviewed and source of Information is: Sibling(s)  [name] sister]   Completed Assessment   Completed Initial Assesment by: Name Leafy Kindle   Role: Transition Coordinator   Discharge Planning Coordination Recommendations   Discharge Planning Coordination Recommendations Home with [facility]   Designated Discharge Caregiver 1   Name designated caregiver Verdis Frederickson   Phone number designated caregiver  [phone]   Relationship Patient Designated Caregiver Other  [ex sister in law]   Finalized Plan   Expected Discharge Date 07/01/20          Kathalene Frames, RN, BSN  Care Manager 6-7 EP  Office [phone]  [facility] [phone]

## 2020-06-30 NOTE — Progress Notes
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 (1 stent at [facility] area), 2014 ?(3 stents in [facility] hospital at [facility]) and PCI?x4?(on review of [place] chest, likely RCA and LAD stents), HFrEF??LVEF 39% (OSH spect 09/2019), inferior wall hypokinesis with?rega?stress test 10/09/19 showing inferior infarct with recovery?of LVEF??with recent TTE 01/2020?showing LVEF of 62%, moderate LVH, no WMA on 02/08/20,?transfered care from [place] to [place] for his 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   ?Pt with poor functional capacity, progressive DOE over the past year, even prior to chemotherapy, slightly different sx from prior MI. Euvolemic on exam?in initial cardio onc outpt visit.?[place] chest?02/2020?showed ascending dilated aorta of 4.5cm?and dilated PA to 4cm. 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 Pt underwent PET stress 05/06/20?that was high risk, w/+TID, 2 large reversible perfusion defects, LVEF of 40% at rest, drop to 36% on stress?with [place] chest showing possible progression of disease. Plavix started cautiously without bolus due to recent hematuria complaints to see if he would tolerate DAPT.?Pt intervally dx with metastatic bladder cancer. Plans to start enfortumab, a nectin 4 antagonist for his metastatic urothelial carcinoma. Of note, on review of path, it is possible that what was thought to be lung cancer, is actually metastatic urothelial carcinoma per oncology.    Pt has had 5 hospitalizations: 9/13-9/16/21 for HF exacerbation and hematuria for which plavix was held, 9/23-9/24/21 for urinary retention, 9/25-9/29 for urinary retention and pain (with cystoscopy and TURBT 9/26 revealing a 3x4 cm papillary tumor in bladder with post-op hematuria and path confirmed metastatic urothelial carcinoma), and 10/1-10/4 for pre-syncope thought to be 2/2 hypovolemia. During 10/1-10/4 hospitalization, Cardiology was consulted for troponin elevation and he was given IV methylprednisone 1 g for 3 days with resumption of prednisone taper starting from 130 mg and decreasing by 10 mg weekly. His metoprolol, lisinopril and furosemide were resumed on discharge. ASA was resumed as well, and per Urology clopidogrel was held due to?persistent?hematuria and concern for GI bleed (evaluation for this deferred as he only had 2 episodes of dark stools). A repeat TTE 05/29/20 showed LVEF 66%. He was last seen in Cardio Oncology clinic 06/03/20 where metoprolol, lisinopril, furosemide were continued, as was prednisone 130 mg daily. He was not thought to be an appropriate candidate for LHC given bleeding issues in a nonurgent/emergent settting. In most recent hospitalization 10/21-10/28 for fluid overload/SOB, pt also had a near syncopal episode when going from sitting to standing after several days of diuresis;  PE was negative; telemetry was negative for any significant arrhythmias associated with that episode. Pt has known frequent PVCs for which he was started on metoprolol succ 12.5. Pt underwent IV diuresis with net neg 9L last hospital stay. Repeat echo 06/17/20 showed interval reduction in RVEF, RV dilation, LVEF visually estimated to be 55-60%.   MRI 06/21/20 on my review, T2, fat sat still looks +, suggestive of myocardial edema (although there was some artifact), LVEF has recovered compared to prior cMRI 04/2020, DGE pattern is similar in an ischemic distribution in RCA and LAD territories, so pt was continued on steroids 1mg /kg  and started on mycophenolate 1g bid to try to reduce his steroid use.        Pt is readmitted again yesterday for LH, hypoxia, hypotension and near syncope at home.  Cardio onc called to weigh in.  Pt also with reduced appetite, feeling depressed, weaned down on smoking, denies any blood in urine. Taking all medx including plavix, all active medx on his medx list, including torsemide. Pulm edema noted on CXR and given 1x 80 lasix in ED. Trop T 0.05, BNP905, TSH wnl, creatinine up to 2.38 from baseline of 1.6-1.8, procal mildly elevated 0.3, WBC intervally increased to 36 from 22, pt was pan cx and started on empiric abx for possible HAP.    Pt denies any CP/palpitations, denies syncope, but feeling very lightheaded and weak in his legs when standing, pt teary eyed and anxious about his prognosis      Vitals:    06/29/20 0900 06/29/20 1157 06/29/20 1240 06/29/20 1559   BP: 103/60 (!) 93/59 111/73 108/62   Pulse: 60 61 78 70   Resp: 17 (!) 21 19    Temp: 97.9 ?F (36.6 ?C) 97.7 ?F (36.5 ?C) 97.8 ?F (36.6 ?C) 98.5 ?F (36.9 ?C)   TempSrc: Oral Oral Oral Oral   SpO2: 94% (!) 92% 95% 98%   Weight:         BMI Readings from Last 3 Encounters:   06/28/20 39.95 kg/m?   06/23/20 39.95 kg/m?   06/13/20 42.04 kg/m?     Wt:   06/28/20 133.6 kg   06/23/20 133.6 kg   06/13/20 (!) 140.6 kg   06/09/20 (!) 141.7 kg   06/02/20 (!) 136.9 kg   05/27/20 (!) 138.3 kg     Pt appears close to euvolemic on exam, there is still LE swelling, but suspect that pt is close to euvolemic intravascularly.    TTE 06/29/2020  Normal left ventricular size and systolic function. No regional wall motion abnormalities.  LVEF calculated by biplane Simpson's was 70%.  No thrombus visualized in the left ventricle.  * Suboptimal visualization of the right ventricle, but it appears to be normal size with normal function.  * IVC diameter < 2.1 cm that collapses > 50% with a sniff suggests normal RAP (0-5 mmHg, mean 3 mmHg).  * No significant pericardial effusion.  * Compared with prior study dated 06/17/2020: Right ventricle was suboptimally visualized on both studies but appears normal on current study.    ECG 11/2 nsr, pacs        Hypotension could be due to intravascular depletion and/or autonomic dysfunction from prior platinum administration. TSH wnl and pt is on high dose steroids, so should not have adrenal insufficiency. Pt also has frequent PVCs on his telemetry, will increase his metop succ to 25 mg. Pt appears close to euvolemic on exam, has some LE edema that is likely interstitial. Recommend compression stockings. Pt also with depressive sx, recommend palliative care consult as below.  Of note, pt with AKI and creatinine has been going up since IV contrast on 10/26, possible that hypotensive episodes could have contributed; FeUrea suggests more intrinsic renal disease, agree with AKI workup per renal.    Plan  Please get orthostatic vital signs  Get urine lytes/urine urea to evaluate rising creatinine, make sure pt is not intravascularly depleted: FeUrea >35%, suggests intrinsic renal disease. Fena suggests pre renal, but pt also got lasix, so would be less accurate  Ok with fluid challenge  Please put compression  stockings on  Trend serial troponin and 12 lead ecg daily  Agree with CK level  Can continue plavix if pt tolerating  Strict Is and Os  Daily weights  Can hold diuretics for now given normal pressures on echo and ongoing workup for AKI  Continue his current steroid dose of 100mg /day, taper 10mg  per week as long as troponin downtrends  Continue asa 81, atorva 80/or equivalent  Ok to hold acei for now, consider restarting once renal function/BP improves: lisinopril 2.5mg   Continue metop succ 25 if HR >55 given high PVC burden  Get palliative care consult for management of depression  Continue 1000mg ?BID PO?myocophenolate* in addition to current steroid dose and will taper prednisone 10mg /wk while monitoring his troponins and imaging  Repeat cMRI in 4-6 weeks  Continue smoking cessation  Follow up with cardio oncology as outpt 11/12  Get 4 week event monitor as an outpt given ICI myocarditis and lightheaded sx.

## 2020-06-30 NOTE — Progress Notes
Dilating eye drops were given to the patient by the ophthalmology service on 06/30/2020 at 1:48 PM. Ophthalmology will be returning to complete a dilated funduscopic exam. The attending will addend the note from yesterday afterwards. Signed:Miller Limehouse, MDElectronically Signed by Tracey Harries, MDPGY-2, OphthalmologyPage: [phone] 4, 20211:48 PM

## 2020-06-30 NOTE — Plan of Care
Cardio-oncology plan of careCreatinine has come down to 1.8 after 1L LR, suggestive that pt is more intravascularly depleted.Troponin T is also downtrending to 0.02 todayDaily troponins and ECGStrict Is and OsDaily weightsContinue compression stockingsBe cautious with fluids given diastolic dysfunctionContinue prednisone taper at 10mg /week, mycophenolate 1g bidK>4, Mg>2PVC burden has improved with metop succ 25mg  Avoid QTc prolonging medications, see crediblemeds.org Pt also wanted me to update his sister in law [name] about his status; I called and it went to VM.Vitals:  06/30/20 0502 06/30/20 0802 06/30/20 1225 06/30/20 1658 BP: 125/68 138/67 129/69 121/71 Pulse: (!) 55 65 75 67 Resp: 18 15 19   Temp: 98.5 ?F (36.9 ?C) 97.8 ?F (36.6 ?C) 97.8 ?F (36.6 ?C)  TempSrc: Oral Oral Oral  SpO2: 96% 97% 95% 95% Weight:   128.5 kg  BMI Readings from Last 3 Encounters: 06/30/20 38.42 kg/m? 06/23/20 39.95 kg/m? 06/13/20 42.04 kg/m?

## 2020-06-30 NOTE — Other
-    CONSULT  REQUEST  DOCUMENTATION  -  [facility] NOTE  -  Type of consult: [facility]   -  New Consult: [id number] Onalee Hua J Saliba /Location: 6822/6822-B / *Brief Clinical Question: concern for depression/anxiety, ?further med titration/Callback Cell Phone: [phone]** / Please confirm receipt of this message by texting back ?OK?  -  1 - Mobile Heartbeat message sent to [facility] at 2:07 PM. Received response at 1410.  Casimiro Needle Keawe Marcello  06/30/2020  2:07 PM  Consult [facility] [phone]

## 2020-06-30 NOTE — Plan of Care
Problem: Adult Inpatient Plan of CareGoal: Plan of Care ReviewOutcome: Interventions implemented as appropriateGoal: Patient-Specific Goal (Individualized)Outcome: Interventions implemented as appropriateGoal: Absence of Hospital-Acquired Illness or InjuryOutcome: Interventions implemented as appropriateGoal: Optimal Comfort and WellbeingOutcome: Interventions implemented as appropriateGoal: Readiness for Transition of CareOutcome: Interventions implemented as appropriate Plan of Care Overview/ Patient Status    0700-1900A&Ox4, Very sleepy/ Lethargic and tearful this shift; slightly anxious.  VSS; Pvcs on TELE.  SBP 90s-100s.  Sating 95% on 2 L O2.  Neb treatments q 4 hrs.  OOB turn and pivot Ax1; DOE; fatigued; needs recovery time.  Pt went for Echo and Renal US today.  Urine studies sent.  A sample is at bedside for Urology as requested.  Nephew at bedside offering comfort/ support.  Pt eating 50% meals. No c/o pain or discomfort.  Antiembolic stockings ordered.  BP (!) 103/58  - Pulse 66  - Temp 98.2 ?F (36.8 ?C)  - Resp 14  - Wt 133.6 kg  - SpO2 95%  - BMI 39.95 kg/m?

## 2020-06-30 NOTE — Plan of Care
Plan of Care Overview/ Patient Status    1900-0700Pt AxOx4, tearful about day shift and news on diagnosis.VSS on 2L NC ex brady @ times. Tele in place HR 50-60sCPAP ordered, RT placed on pt around 2300, pt removed at 0100 due to discomfort. No c/o CP, N/V,+ DOEPt c/o pain to abdomen, PRN tylenol and scheduled oxy given with + effect, pt sleeping in bed.Pt voided 150, PVR 430, SC @ 2300 and removed 400 cc CYU, PVR 15cc. Pt cont of B/B when able to void ind., 1 BM this shift, urinal/bedpan use due to dizziness when standing. Pt voided 200cc @ 0500, PVR 190. Straight cath >400Poor PO intake this shift, ate 2 puddings for dinner. OOB Ax1 with RW, except very dizzy and unstable when walking, used bedpan this shift. IVF running @ 75 mL/hrOptho came to visit patient r/t blurriness in left eye. See optho note for more details.Morning labs sent.Called pharmacy related to vanco trough, pharmacy cancelled trough. Hourly rounding complete, bed alarm on, call bell in reachSee flow sheets for more details.

## 2020-06-30 NOTE — Progress Notes
[facility]	 [facility]	Nephrology Progress NoteS:Received 1 Lr of LR ONVery overwhelmed in the AM todayContinues to have no appetite1.1lL UOP MEDICATIONS:Scheduled Meds:Current Facility-Administered Medications Medication Dose Route Frequency Provider Last Rate Last Admin ? aspirin EC delayed release tablet 81 mg  81 mg Oral Daily Axel Filler I, MD   81 mg at 06/29/20 1006 ? cetirizine (ZyrTEC) tablet 5 mg  5 mg Oral Daily Adekolu, [name], MD   5 mg at 06/29/20 1007 ? clopidogreL (PLAVIX) tablet 75 mg  75 mg Oral Daily Axel Filler I, MD   75 mg at 06/29/20 1008 ? [Held by provider] doxycycline TAB/CAP 100 mg  100 mg Oral Q12H Axel Filler I, MD   100 mg at 06/29/20 1009 ? escitalopram oxalate (LEXAPRO) tablet 10 mg  10 mg Oral Nightly Axel Filler I, MD   10 mg at 06/29/20 2150 ? gabapentin (NEURONTIN) capsule 600 mg  600 mg Oral Nightly Axel Filler I, MD   600 mg at 06/29/20 2150 ? heparin (porcine) injection 5,000 Units  5,000 Units Subcutaneous Q8H Link Snuffer, MD   5,000 Units at 06/30/20 0617 ? ipratropium-albuteroL (DUO-NEB) 0.5 mg-3 mg(2.5 mg base)/3 mL nebulizer solution 3 mL  3 mL Nebulization Q6H WA Axel Filler I, MD   3 mL at 06/29/20 1928 ? metoprolol succinate XL (TOPROL-XL) 24 hr tablet 25 mg  25 mg Oral Daily Advani, [name], MD   25 mg at 06/29/20 1651 ? mycophenolate mofetil (CELLCEPT) tablet 1 g  1 g Oral BID Axel Filler I, MD   1 g at 06/29/20 2150 ? oxybutynin XL (DITROPAN-XL) 24 hr tablet 5 mg  5 mg Oral Daily Adekolu, [name], MD   5 mg at 06/29/20 1009 ? oxyCODONE (OxyCONTIN) 12 hr tablet 10 mg  10 mg Oral Q12H Axel Filler I, MD   10 mg at 06/29/20 1957 ? pantoprazole (PROTONIX) EC tablet 40 mg  40 mg Oral Daily Axel Filler I, MD   40 mg at 06/29/20 1010 ? [Held by provider] piperacillin-tazobactam (ZOSYN) 3.375 g in sodium chloride 0.9% 50 mL (mini-bag plus)  3.375 g Intravenous Q6H Adekolu, [name], MD 16.67 mL/hr at 06/29/20 1200 3.375 g at 06/29/20 1200 ? predniSONE (DELTASONE) tablet 100 mg  100 mg Oral Daily Axel Filler I, MD   100 mg at 06/29/20 1010 ? rosuvastatin (CRESTOR) tablet 40 mg  40 mg Oral Daily Adekolu, [name], MD   40 mg at 06/29/20 1010 ? sodium chloride 0.9 % flush 3 mL  3 mL IV Push Q8H Axel Filler I, MD   3 mL at 06/29/20 0648 ? tamsulosin (FLOMAX) 24 hr capsule 0.4 mg  0.4 mg Oral Nightly Adekolu, [name], MD   0.4 mg at 06/29/20 2150 ? [Held by provider] Vancomycin Therapy Placeholder   Intravenous placeholder Axel Filler I, MD     Continuous Infusions:PRN Meds:.acetaminophen, LORazepam, polyethylene glycol, prochlorperazine, simethicone, sodium chloridePE:Vitals:  06/30/20 0802 BP: 138/67 Pulse: 65 Resp: 15 Temp: 97.8 ?F (36.6 ?C) Gen:  Sitting in bed comfortablyCV:  Normal S1-S2Resp:  CTABAbd:  Soft nondistendedExt:  1+ pitting edemaLABS:LabsRecent Labs Lab 11/02/211133 11/02/211133 11/03/210704 11/03/210704 11/04/210523 WBC 36.4*  --  32.0*  --  26.6* HGB 10.5*   < > 10.1*   < > 9.9* HCT 31.4*   < > 29.7*   < > 30.6* PLT 231   < > 214   < > 181  < > = values in this interval not displayed.  Recent Labs Lab 11/02/211133 11/02/211133 11/03/210704 11/03/210704 11/04/210523 NA 137  --  138  --  142 K 4.4   < > 3.8   < > 4.6 CL 98   < > 100   < > 105 CO2 24   < > 27   < > 27 BUN 44*   < > 46*   < > 38* CREATININE 2.36*   < > 2.38*   < > 1.80* GLU 148*   < > 95   < > 108* ANIONGAP 15   < > 11   < > 10  < > = values in this interval not displayed. Recent Labs Lab 11/02/211133 11/03/210704 11/04/210523 CALCIUM 8.5* 8.5* 8.5*  Recent Labs Lab 11/02/211133 LABPROT 10.9 INR 1.00  No results for input(s): ALT, AST, ALKPHOS, BILITOT, BILIDIR in the last 168 hours. Assessment:69 y.o.?male?with?PMHx?of?metastatic urothelial cancer (dx in 06/2019 after p/w DOE, s/p 4 cycles of carbo/Taxol/Pembro. 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 Renal U/S unchanged. Still has some SOB but no crackles. Satting at 98% on 1 LR NC which I think can be weaned off. Cr 1.8 from 2.3 with B/S around 1.6. Needs to improve PO intake. Unlikely  AIN ( due to PPI or ICPI given cr improvement and receiving steroids) but can consider if Cr worsens though that may happen again if his PO intake remains as it is right now.  He also seems to be retaining urine requiring straight cathing.  Need to avoid urinary retention as that will hamper recovery.  Echo reviewed and normal?Renal Plan:1.Non oliguric AKINo further IV fluidsConsider starting FlomaxConsider urology consult for urinary retentionEncourage p.o. intakeContinue to hold lisinopril and BactrimStrict daily weights and ins and outs dailyDiscussed with AttendingMuhammad Milta Deiters MD11/11/2019 8:30 AMAfter hours and on Weekends please contact the Renal Fellow on call (see AMION)

## 2020-07-01 ENCOUNTER — Telehealth: Admit: 2020-07-01 | Payer: PRIVATE HEALTH INSURANCE | Attending: Medical Oncology

## 2020-07-01 ENCOUNTER — Ambulatory Visit: Admit: 2020-07-01 | Payer: PRIVATE HEALTH INSURANCE

## 2020-07-01 LAB — BASIC METABOLIC PANEL
BKR ANION GAP: 12 (ref 7–17)
BKR BLOOD UREA NITROGEN: 27 mg/dL — ABNORMAL HIGH (ref 8–23)
BKR BUN / CREAT RATIO: 17.9 (ref 8.0–23.0)
BKR CALCIUM: 8.9 mg/dL (ref 8.8–10.2)
BKR CHLORIDE: 104 mmol/L (ref 98–107)
BKR CO2: 26 mmol/L (ref 20–30)
BKR CREATININE: 1.51 mg/dL — ABNORMAL HIGH (ref 0.40–1.30)
BKR EGFR (AFR AMER): 56 mL/min/{1.73_m2} (ref >60)
BKR EGFR (NON AFRICAN AMERICAN): 46 mL/min/1.73m2 — ABNORMAL HIGH (ref >60)
BKR GLUCOSE: 116 mg/dL — ABNORMAL HIGH (ref 70–100)
BKR POTASSIUM: 3.8 mmol/L (ref 3.3–5.3)
BKR SODIUM: 142 mmol/L (ref 136–144)

## 2020-07-01 LAB — CBC WITH AUTO DIFFERENTIAL
BKR WAM ABSOLUTE IMMATURE GRANULOCYTES: 0.6 x 1000/??L — ABNORMAL HIGH (ref 0.0–0.3)
BKR WAM ABSOLUTE LYMPHOCYTE COUNT: 1.3 x 1000/??L (ref 1.0–4.0)
BKR WAM ABSOLUTE NRBC: 0 x 1000/??L (ref 0.0–0.0)
BKR WAM ANALYZER ANC: 24.7 x 1000/ÂµL — ABNORMAL HIGH (ref 1.0–11.0)
BKR WAM BASOPHIL ABSOLUTE COUNT: 0.1 x 1000/??L — ABNORMAL HIGH (ref 0.0–0.0)
BKR WAM BASOPHILS: 0.2 % (ref 0.0–4.0)
BKR WAM EOSINOPHIL ABSOLUTE COUNT: 0 x 1000/??L (ref 0.0–1.0)
BKR WAM EOSINOPHILS: 0 % (ref 0.0–7.0)
BKR WAM HEMATOCRIT: 30.9 % — ABNORMAL LOW (ref 37.0–52.0)
BKR WAM HEMOGLOBIN: 10.1 g/dL — ABNORMAL LOW (ref 12.0–18.0)
BKR WAM IMMATURE GRANULOCYTES: 2 % (ref 0.0–3.0)
BKR WAM LYMPHOCYTES: 4.8 % — ABNORMAL LOW (ref 8.0–49.0)
BKR WAM MCH (PG): 31 pg (ref 27.0–31.0)
BKR WAM MCHC: 32.7 g/dL — ABNORMAL HIGH (ref 31.0–36.0)
BKR WAM MCV: 94.8 fL — ABNORMAL HIGH (ref 78.0–94.0)
BKR WAM MONOCYTE ABSOLUTE COUNT: 0.5 x 1000/??L (ref 0.0–2.0)
BKR WAM MONOCYTES: 2 % — ABNORMAL LOW (ref 4.0–15.0)
BKR WAM MPV: 9.7 fL (ref 6.0–11.0)
BKR WAM NEUTROPHILS: 91 % — ABNORMAL HIGH (ref 37.0–84.0)
BKR WAM NUCLEATED RED BLOOD CELLS: 0 % (ref 0.0–1.0)
BKR WAM PLATELETS: 212 x1000/??L (ref 140–440)
BKR WAM RDW-CV: 15.1 % — ABNORMAL HIGH (ref 11.5–14.5)
BKR WAM RED BLOOD CELL COUNT: 3.3 M/??L — ABNORMAL LOW (ref 3.8–5.9)
BKR WAM WHITE BLOOD CELL COUNT: 27.1 x1000/??L — ABNORMAL HIGH (ref 4.0–10.0)

## 2020-07-01 LAB — TROPONIN T     (Q): BKR TROPONIN T: 0.02 ng/mL — ABNORMAL HIGH (ref <0.01)

## 2020-07-01 LAB — MAGNESIUM: BKR MAGNESIUM: 2.3 mg/dL — ABNORMAL LOW (ref 1.7–2.4)

## 2020-07-01 MED ORDER — CYCLOPENTOLATE 1 % EYE DROPS
1 % | Freq: Two times a day (BID) | OPHTHALMIC | Status: CP
Start: 2020-07-01 — End: ?

## 2020-07-01 MED ORDER — PREDNISOLONE ACETATE 1 % EYE DROPS,SUSPENSION
1 % | Freq: Four times a day (QID) | OPHTHALMIC | Status: CP
Start: 2020-07-01 — End: ?
  Administered 2020-07-01 – 2020-07-05 (×10): 1 mL via OPHTHALMIC

## 2020-07-01 MED ORDER — LIDOCAINE 5 % ADHESIVE PATCH
5 % | Freq: Every day | TRANSDERMAL | Status: DC | PRN
Start: 2020-07-01 — End: 2020-07-07

## 2020-07-01 MED ORDER — POTASSIUM CHLORIDE 20 MEQ/15 ML ORAL LIQUID
Freq: Once | ORAL | Status: CP
Start: 2020-07-01 — End: ?
  Administered 2020-07-01: 16:00:00 via ORAL

## 2020-07-01 NOTE — Telephone Encounter
Call returned and spoke to [name].  Pt remains in patient and we are able to plan for treatment until discharged and cleared by cardiology.  Will await discharge notification to reschedule.  [name] is also asking about a time frame expectation regarding possible hospice. Explained that if we are pursuing treatment can not pursue hospice at the same time They do wish to pursue treatment and at this time it may be difficulty to determine as response to treatment is not known.  Will follow up when discharged. Electronically Signed by Azzie Roup, RN, July 01, 2020

## 2020-07-01 NOTE — Plan of Care
Cardio-oncology plan of careCreatinine has gone back to baseline, 1.5 todayOrthostatics near positive today, based on his BP and heart rate trend, more likely autonomic dysfunction given drop in both with standing Recommend compression stockingsRepeat orthostatic vitals tomorrowReferral to Dr Stark Bray as outpt for further evaluation of his orthostatic hypotension if persists after compression stockingsCalled sister in law [name] to update herContinue his prednisone taper 10mg /week as long as his troponin is downtrendingContinue myocophenolatePT/OT recs for dispo, may benefit from some STRFollow up w/cardio onc as outptMonitor troponins, ecg and keep on telemetry when inptOk to d/c from cardiology perspective if compression stockings help with orthostaticsHold diuretics given poor PO intake, can reassess as an outptContinue his metprolol

## 2020-07-01 NOTE — Other
[facility] HospitalOphthalmology Initial Consult NoteConsult Information: Date of Admission: 06/28/2020 Date of Service: June 30, 2020 Consult Requested By: inpatient medicine Advani, [name], MD Reason for Consult: blurry visionPresentation History: Paul Hunter is a 69 y.o. 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 Ophthalmology was consulted for new blurry vision and red spot noted at white of left eye. Patient reported that he has been having blurry vision and tearing for about a month. He fell on the morning of 11/2 and nurse who was with him noticed some redness in his eye. Blurry vision has been worse since the fall. He endorsed chronic floater. He denied eye pain, burning, diplopia, flashes, curtains, veils.  Review of Allergies/Medical History/Medications: Ophthalmologist: NonePOH: 	(-) Eye surgeries, (-) Lasers, (+) GlassesOcular meds: systanePMH: as aboveCurrent inpatient Medications: Scheduled Meds:Current Facility-Administered Medications Medication Dose Route Frequency Provider Last Rate Last Admin  aspirin EC delayed release tablet 81 mg  81 mg Oral Daily Adekolu, [name], MD   81 mg at 06/29/20 1006  cetirizine (ZyrTEC) tablet 5 mg  5 mg Oral Daily Adekolu, [name], MD   5 mg at 06/29/20 1007  clopidogreL (PLAVIX) tablet 75 mg  75 mg Oral Daily Axel Filler I, MD   75 mg at 06/29/20 1008  [Held by provider] doxycycline TAB/CAP 100 mg  100 mg Oral Q12H Adekolu, [name], MD   100 mg at 06/29/20 1009  escitalopram oxalate (LEXAPRO) tablet 10 mg  10 mg Oral Nightly Axel Filler I, MD   10 mg at 06/29/20 2150  gabapentin (NEURONTIN) capsule 600 mg  600 mg Oral Nightly Axel Filler I, MD   600 mg at 06/29/20 2150  heparin (porcine) injection 5,000 Units  5,000 Units Subcutaneous Q8H Adekolu, [name], MD   5,000 Units at 06/29/20 2213  ipratropium-albuteroL (DUO-NEB) 0.5 mg-3 mg(2.5 mg base)/3 mL nebulizer solution 3 mL  3 mL Nebulization Q6H WA Adekolu, [name], MD   3 mL at 06/29/20 1928  metoprolol succinate XL (TOPROL-XL) 24 hr tablet 25 mg  25 mg Oral Daily Advani, [name], MD   25 mg at 06/29/20 1651  mycophenolate mofetil (CELLCEPT) tablet 1 g  1 g Oral BID Axel Filler I, MD   1 g at 06/29/20 2150  oxybutynin XL (DITROPAN-XL) 24 hr tablet 5 mg  5 mg Oral Daily Adekolu, [name], MD   5 mg at 06/29/20 1009  oxyCODONE (OxyCONTIN) 12 hr tablet 10 mg  10 mg Oral Q12H Adekolu, [name], MD   10 mg at 06/29/20 1957  pantoprazole (PROTONIX) EC tablet 40 mg  40 mg Oral Daily Adekolu, [name], MD   40 mg at 06/29/20 1010  [Held by provider] piperacillin-tazobactam (ZOSYN) 3.375 g in sodium chloride 0.9% 50 mL (mini-bag plus)  3.375 g Intravenous Q6H Adekolu, [name], MD 16.67 mL/hr at 06/29/20 1200 3.375 g at 06/29/20 1200  predniSONE (DELTASONE) tablet 100 mg  100 mg Oral Daily Adekolu, [name], MD   100 mg at 06/29/20 1010  rosuvastatin (CRESTOR) tablet 40 mg  40 mg Oral Daily Adekolu, [name], MD   40 mg at 06/29/20 1010  sodium chloride 0.9 % flush 3 mL  3 mL IV Push Q8H  Axel Filler I, MD   3 mL at 06/29/20 0648  tamsulosin (FLOMAX) 24 hr capsule 0.4 mg  0.4 mg Oral Nightly Adekolu, [name], MD   0.4 mg at 06/29/20 2150  [Held by provider] Vancomycin Therapy Placeholder   Intravenous placeholder Axel Filler I, MD     Continuous Infusions: sodium chloride 75 mL/hr (06/29/20 1958) PRN Meds:.acetaminophen, LORazepam, polyethylene glycol, prochlorperazine, simethicone, sodium chlorideReview of Systems: As per HPI Ophthalmologic Exam: Vitals:  06/29/20 2354 BP: 127/64 Pulse: 60 Resp: 20 Temp: 98.3 ?F (36.8 ?C) General Alert and cooperative  Right Left External exam normal normal Visual Acuity Right Left [] DistSC [] DistCC [] NearSC [x] NearCC 20/25 20/30 Pupils 3->1, brisk, no relative afferent pupillary defect 5->4, sluggish, no relative afferent pupillary defect Motility Full Full Confrontational Visual Fields Full Full Amsler grid No scotomas or metamorphopsias No scotomas or metamorphopsias Color Plates Harlan Stains) 9/9 9/9 Anterior Exam[] slitlamp [] portable [x] penlight Right Left Lids and Lashes Smooth and well positioned Smooth and well positioned Conjunctiva and Sclera White and quiet subconjunctival heme inferiorly Cornea clear clear Anterior chamber Deep and formed. No obvious cellularity or layering Deep and formed. No obvious cellularity or layering Iris Flat Flat Intraocular pressure at 4:01 AM Right Left [] Applanation [x] Tonopen  [] Palpation 13 14 Patient dilated [x] Both eyes [] Right eye [] Left eye with Phenylephrine 2.5% and Tropicamide 1%Dilated Fundus Exam (DFE) Right Left Lens 2+ NS 2+ NS Vitreous Clear Clear Disc 0.3 cup to disc, sharp, pink and flat 0.3 cup to disc, sharp, pink and flat Macula Flat Flat Vessels Normal caliber and distribution Normal caliber and distribution Periphery Flat Flat Review of Labs/Diagnostics: Diagnostic Review:No recent ophthalmic imaging Impression/Recommendations #Subconjunctival Hemorrhage, Left Eye:- in the setting of recent fall- extraocular motility full, intraocular pressure 14 in affected eye, anterior chamber formed, no hyphema, no proptosis#Traumatic Iritis, Left Eye:- In the setting of blunt trauma to the right eye- pupils dilated and sluggish - No corneal abrasion- Normal posterior exam without any retinal detachment, tear or breaks, no signs of retinae commotio - intraocular pressure 14 of affected eyeRecommendations:Left Eye:- cyclopentolate 1 drop two times a day (RED TOP) for 2 days- prednisolone acetate 1% 1 drop four times a day (PINK/WHITE TOP) for 5 daysBoth Eyes:- start over-the-counter artificial tears (examples are Systane or Refrash) in both eyes 4 time a day (breakfast, lunch, dinner, bedtime) and as neededFollow-up:- [facility] at [facility], 2nd [phone] to be seen and staffed by consult attending within 24 hours. Please note all recommendations are preliminary until the patient is evaluated by a consult attending.Thank you for the opportunity to participate in the care of this patient.Please do not hesitate to call ophthalmology with any questions or concerns at [phone] for [facility], [phone] for [facility].Signed:Simra Fiebig, MDElectronically Signed by Tracey Harries, MDPGY-2, OphthalmologyPage: [phone] 4, 20214:01 AM

## 2020-07-01 NOTE — Progress Notes
[facility]	 [address] [facility]	Nephrology Progress NoteS:No acute eventsPO intake improving1.2L UOPAKI resolved. Cr 1.5  MEDICATIONS:Scheduled Meds:Current Facility-Administered Medications Medication Dose Route Frequency Provider Last Rate Last Admin ? aspirin EC delayed release tablet 81 mg  81 mg Oral Daily Axel Filler I, MD   81 mg at 07/01/20 0856 ? carboxymethylcellulose (REFRESH PLUS) 0.5 % ophthalmic solution in dropperette 1 drop  1 drop Both Eyes 4x Daily [place], [place], [place]   1 drop at 07/01/20 0856 ? cetirizine (ZyrTEC) tablet 5 mg  5 mg Oral Daily Axel Filler I, MD   5 mg at 07/01/20 0856 ? clopidogreL (PLAVIX) tablet 75 mg  75 mg Oral Daily Axel Filler I, MD   75 mg at 07/01/20 0856 ? cyclopentolate (CYCLODRYL) 1 % ophthalmic solution 1 drop  1 drop Left Eye BID [place], [place], [place]   1 drop at 07/01/20 1610 ? [Held by provider] doxycycline TAB/CAP 100 mg  100 mg Oral Q12H Axel Filler I, MD   100 mg at 06/29/20 1009 ? escitalopram oxalate (LEXAPRO) tablet 10 mg  10 mg Oral Nightly Adekolu, [name], MD   10 mg at 06/30/20 2200 ? gabapentin (NEURONTIN) capsule 300 mg  300 mg Oral Nightly [place], [place], [place]   300 mg at 06/30/20 2200 ? heparin (porcine) injection 5,000 Units  5,000 Units Subcutaneous Q8H Link Snuffer, MD   5,000 Units at 07/01/20 9604 ? ipratropium-albuteroL (DUO-NEB) 0.5 mg-3 mg(2.5 mg base)/3 mL nebulizer solution 3 mL  3 mL Nebulization Q6H WA Axel Filler I, MD   3 mL at 07/01/20 0826 ? metoprolol succinate XL (TOPROL-XL) 24 hr tablet 25 mg  25 mg Oral Daily Advani, [name], MD   25 mg at 07/01/20 0856 ? mycophenolate mofetil (CELLCEPT) tablet 1 g  1 g Oral BID Axel Filler I, MD   1 g at 07/01/20 0856 ? oxybutynin XL (DITROPAN-XL) 24 hr tablet 5 mg  5 mg Oral Daily Adekolu, [name], MD   5 mg at 07/01/20 0856 ? oxyCODONE (OxyCONTIN) 12 hr tablet 10 mg  10 mg Oral Q12H Axel Filler I, MD   10 mg at 07/01/20 0855 ? pantoprazole (PROTONIX) EC tablet 40 mg  40 mg Oral Daily Axel Filler I, MD   40 mg at 07/01/20 0854 ? prednisoLONE acetate (PRED FORTE) 1 % ophthalmic suspension 1 drop  1 drop Left Eye 4x Daily [place], [place], [place]   1 drop at 07/01/20 5409 ? predniSONE (DELTASONE) tablet 100 mg  100 mg Oral Daily Axel Filler I, MD   100 mg at 07/01/20 0856 ? rosuvastatin (CRESTOR) tablet 40 mg  40 mg Oral Daily Axel Filler I, MD   40 mg at 07/01/20 0856 ? sodium chloride 0.9 % flush 3 mL  3 mL IV Push Q8H Adekolu, [name], MD   3 mL at 06/30/20 1305 ? tamsulosin (FLOMAX) 24 hr capsule 0.4 mg  0.4 mg Oral Nightly Adekolu, [name], MD   0.4 mg at 06/30/20 2200 Continuous Infusions:PRN Meds:.acetaminophen, carboxymethylcellulose, [Held by provider] LORazepam, oxyCODONE, polyethylene glycol, [Held by provider] prochlorperazine, simethicone, sodium chloridePE:Vitals:  07/01/20 0817 BP: (!) 167/74 Pulse: 87 Resp: 20 Temp: 98 ?F (36.7 ?C) Gen:  Sitting in bed comfortablyCV:  Normal S1-S2Resp:  CTABAbd:  Soft nondistendedExt:  1+ pitting edemaLABS:LabsRecent Labs Lab 11/03/210704 11/03/210704 11/04/210523 11/04/210523 11/05/210430 WBC 32.0*  --  26.6*  --  27.1* HGB 10.1*   < > 9.9*   < > 10.1* HCT 29.7*   < >  30.6*   < > 30.9* PLT 214   < > 181   < > 212  < > = values in this interval not displayed.  Recent Labs Lab 11/03/210704 11/03/210704 11/04/210523 11/04/210523 11/05/210430 NA 138  --  142  --  142 K 3.8   < > 4.6   < > 3.8 CL 100   < > 105   < > 104 CO2 27   < > 27   < > 26 BUN 46*   < > 38*   < > 27* CREATININE 2.38*   < > 1.80*   < > 1.51* GLU 95   < > 108*   < > 116* ANIONGAP 11   < > 10   < > 12  < > = values in this interval not displayed. Recent Labs Lab 11/03/210704 11/04/210523 11/05/210430 CALCIUM 8.5* 8.5* 8.9 MG  --  2.4  --   Recent Labs Lab 11/02/211133 LABPROT 10.9 INR 1.00  No results for input(s): ALT, AST, ALKPHOS, BILITOT, BILIDIR in the last 168 hours. Assessment:68 y.o.?male?with?PMHx?of?metastatic urothelial cancer (dx in 06/2019 after p/w DOE, s/p 4 cycles of carbo/Taxol/Pembro. 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  Need to avoid urinary retention as that will hamper recovery.  Renal Plan:1.CKd 3aContinue flomaxConsider urology consult for urinary retentionEncourage p.o. intakeCan resume lisinoprilStrict daily weights and ins and outs dailyDaily I/OWe will sign offDiscussed with AttendingMuhammad Milta Deiters MDRenal FellowAfter hours and on Weekends please contact the Renal Fellow on call (see AMION)

## 2020-07-01 NOTE — Plan of Care
[facility]		Spiritual Care NotePurpose: Referral Source: Other Chaplain Observation: People present/Information Obtained From: Patient, Sibling Emotional Mood: Calm, Hopeful Quality of Relational Support: Familiy In-Town and Involved Types of Relational Support: Family, Sibling   Subjective: The patient requested the sacrament of communion but at the moment of the ministry he indicated not being  ready to receive. The chaplain provided companionship, emotional and spiritual support and offered blessing.Spiritual Assessment:Referral Source: Other Chaplain Information Obtained From: Patient, Sibling Mood: Calm, HopefulRelational SupportTypes of Relational Support: Family, SiblingQuality of Relational Support: Familiy In-Town and Involved Religious Affiliation: Roman Financial planner Resources: Clergy Visits, Faith, Family, Friends, God - Relationship, Prayer         FICA:   and NA  NASpiritual Interventions:Spiritual Intervention Index**Date of Spiritual Visit: 11/05/21Visit Type: New Patient, Initial VisitLanguage or special accommodation rendered?: NoHow Well Do You Speak English?: very wellIntervention Type: Spiritual VisitResponding Chaplain: Hospital PriestReligious Needs: PrayerSpiritual/Religious Support Provided: Companionship, Hospitality, Prayer, Spiritual SupportOutcome: OUTCOMES: Expressed Spiritual/Religious Resources or Distress: Expressed gratitude, Expressed hope OUTCOMES: Expressed Emotional Resources or Distress: Decreased anxiety OUTCOMES: Progressed Spiritually: Established chaplain relationship OUTCOMES: Progressed Emotionally or Physically: Increased comfort, Increased satisfaction     Plan: The patient will communicate to his nurse when he is ready for the sacrament.   Total Consult Time: 20 minutes	Signed:   Fr. Kameren Pargas AnaetoHospital [phone]  11/5/20214:21 PM

## 2020-07-01 NOTE — Other
-    CONSULT  REQUEST  DOCUMENTATION  -  [facility] NOTE  -  Type of consult: [facility] Urology   -  New Consult: [id number] Onalee Hua J Beverley /Location: 6822/6822-B / Brief Clinical Question: persistent retentionReason for Consult? urinary retention/ foley placement/Callback Cell Phone: [phone] / Please confirm receipt of this message by texting back ?OK?  -  2 - Smartweb Page sent to Urology [facility] Days 2343 at 3:08 PM.  -  Alycia Rossetti  07/01/2020  3:07 PM  Consult Connect Center [phone]

## 2020-07-01 NOTE — Telephone Encounter
Patient's sister, [name], is requesting a call to discuss next step for patient's treatment. [name] reports that patient has been admitted since Tuesday and she has not heard anything as far as treatment for him.

## 2020-07-01 NOTE — Plan of Care
[facility]		Spiritual Care NotePurpose:   Observation: People present/  pt, called HCR on phoneEmotional   Quality of Relational Support: Familiy In-Town and Involved, Family In-Town but Detached Types of Relational Support: Family, Sibling, Daughter, Son Family Dynamics: Pt is divorced and has a son and a daughter.  Pt recently sold his home in [place] and moved back to [place] to be closer to his family.  Pt lives with his niece.  Pt's primary support and HCR is [name], his former sister in Social worker. Subjective: Pt lying in bed, awake, alert, SOB, expressed his despair, anxiety and grief over dx, concerns about dying, sharing his feelings of acceptance of death and also not wanting to die. Pt sold his home in [place] and moved back to [place] to be closer to his family and is delighted that he now has two new grandbabies. Chaplain called pt's HCR, [name] and spoke at length.  Pt has a complicated psychosocial background and family dynamic, with his former sister in law, [name], as his designated HCR, his two adult children Janyth Pupa and Clayton) who live locally, being less involved in his care and his niece (Rayna) providing his day to day care needs as pt lives with her (and her boyfriend.)  Pt's HCR, [name], expressed her concerns with pt's care needs and whether or not niece, Rayna could take care of Pt at home anymore.  [name], [facility] would like to talk with the team about pt's realistic prognosis, treatment options and disposition.  She expressed her strong interest in hospice.  Chaplain provided active listening, meaning making, spiritual and family support and referral to hospital priest for sos and communion per pt request. Spiritual Assessment:   Relational SupportTypes of Relational Support: Family, Sibling, Daughter, SonFamily Dynamics: Pt is divorced and has a son and a daughter.  Pt recently sold his home in [place] and moved back to [place] to be closer to his family.  Pt lives with his niece.  Pt's primary support and HCR is [name], his former sister in Social worker.Quality of Relational Support: Familiy In-Town and Involved, Family In-Town but Detached Religious Affiliation: Designer, television/film set Aware/Contacted: Patient or Family Will NotifySpiritual Resources: Family, Faith, Sports coach, God - Relationship, Spiritual ValuesHelpful Religious Practices: Sports coach, Faith SharingRitual/Sacrament: Communion, Emergency planning/management officer of the Sick Type of Communion: Teacher, adult education of Communion: Daily  FICA: FICA Spiritual Assessment ToolFaith: Spiritual/Religious: yesFaith: Spiritual Beliefs: yesFaith: Life Meaning: his new grand babies, familyImportance: Faith/Belief: helpful for copingNeed for spiritual support : Yes   Spiritual Interventions:Spiritual Intervention Index**Date of Spiritual Visit: 11/05/21Consulted With: Palliative Care, Other ChaplainSpiritual/Religious Support Provided: Companionship, Life Review, Family Support, Spiritual Support, PrayerReferrals Made To: Palliative Care, Hospital PriestFollow-Up Visit Needed: YesOutcome: OUTCOMES: Expressed Spiritual/Religious Resources or Distress: Utilized spiritual resources/practices, Identified important values and beliefs, Identified priorities, Expressed gratitude, Expressed existential distress OUTCOMES: Expressed Emotional Resources or Distress: Emotions Expressed/Distress Reduced, Debriefed/Defused traumatic experience, Decreased anxiety, Expressed grief, Relational resources utilized, Emotional resources utilized OUTCOMES: Progressed Spiritually: Established chaplain relationship, Knowledgeable about services of the Dept. of Spiritual Care, Progressed toward meaning, Progressed toward trust OUTCOMES: Progressed Emotionally or Physically: Increased satisfaction, Increased comfort, Progressed toward acceptance, Progressed toward personal empowermentOUTCOMES: Goals of Care: Improved communication with medical staff    Plan: Palliative care chaplain will continue to provide spiritual support for pt and family as needed.Referrals Made To: Palliative Care, [facility]  Total Consult Time: 67 minutes	Signed: Rosana Hoes, D.Min., BCCChaplain: Palliative Care M - F  8:00-4:30: [phone] other times: [phone]/5/20212:08 PM

## 2020-07-01 NOTE — Plan of Care
Paul Hunter					Location: EP 67/6822-B68 y.o., male				Attending: Josph Macho, MD	Admit Date: 06/28/2020			[id number] LOS: 3 days INITIAL NUTRITION ASSESSMENT Dietary Orders (From admission, onward)     Start     Ordered  06/28/20 1617  Diet Cardiac  DIET EFFECTIVE NOW      Question Answer Comment Sodium Restriction: 3 gm NA  Initiate Nutrition Management Protocol (Yes/No?) Yes - Initiate Protocol    06/28/20 1617    No Known AllergiesANTHROPOMETRICS:Height: 72Admit wt: 128.5 kg- (11/4)Dosing Wt: 89 kg (based on hamwi +10%)BMI: 38.42  (based on admit wt) Additional wt information:Ht confirmed with pt: YES - pt reports wt loss however actual wt loss difficult to determine due to changes in fluid status per pt recall. Pt hx over past 6 months appears fairly stable around 128-134 kg.Wt Readings from Last 20 Encounters: 06/30/20 128.5 kg 06/23/20 133.6 kg 04/29/20 131.5 kg 04/14/20 132.2 kg 04/08/20 132.4 kg 03/31/20 134.3 kg 03/25/20 133.1 kg 03/22/20 130.9 kg 03/10/20 132 kg 02/11/20 131.8 kg NUTRITION-FOCUSED PHYSICAL EXAM:-Muscle/ Subcutaneous Fat Depletion: noneESTIMATED NUTRITION REQUIREMENTS:Kcal/day: 2670 		(30 kcal/kg)Protein/day: 133		(1.5 gm/kg)Fluids/day: 2670 mL/d (5mL/kcal) ONLY as medical and fluid status allows and only per team discretion. Needs based on: Dosing Wt (89 kg), cancer with mets, aki on ckd, high dose steroids. NUTRITION ASSESSMENT: PT EMR reviewed for nutrition consult for poor po intake.  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 Pt recent [place] scan with possible progression of lung mets. Pt CXR s/o pulm edema, possible PNA. COVID negative. Also noted to have AKI on CKD.   Pt A&Ox 4, pt met at bedside with daughter present. Pt usual intake os described as poor quality. Per recall pt typical intake has decline from baseline which was previously very robust. Pt describes historically has been an over eater now only to take smaller more frequent meals. Pt oriented to menu and ordering system to optimize food provided on current diet which remains appropriate to continue. Per discussion pt daughter is very supportive and will assist pt with ordering meals consistently. Cultural/Religious/Ethnic Needs: NoNUTRITION DIAGNOSIS:Inadequate oral intake r/t decreased ability to consume sufficient energy aeb poor appetite and inability to maintain usual intake volumes. INTERVENTIONS/RECOMMENDATIONS: General/ Healthful Diet?	Continue current management. ?	Goal: ?	Patient to consume an average of > 50-75% meals prior to next nutrition assessment Modified Distribution, Type, or Amount of food and nutrients:?	Small frequent meals and snacksFeeding Assistance:?	Independent, will also get help from family Supplements:?	Commercial beverage- Ensure Plus TID?	Goal:  ?	Patient to consume an average of 2-3 oral nutrition supplements/day prior to next nutrition assessment Discharge and Transfer of Nutrition Care to New Setting or Provider:?	 Nutrition Prescription- Discharge needs not determined at this time will continue to follow MONITORING / EVALUATION:Food/Nutrition-Related Outcomes:            food and nutrient intake/administration.Anthropometric Outcomes:            weightBiochemical Data, Medical Tests, and Procedure Outcomes:            Labs (General chemistry, renal labs, Glucose).Nutrition-Focused Physical Finding Outcomes:            Physical appearance, muscle and fat wasting, swallow function, appetite.Electronically signed by Sherrilee Gilles, RD, November 5, 2021M[facility]- [phone] Please note: Nutrition is a consult only service on weekends and holidays.  Please enter a consult in EPIC if assistance is needed on a weekend or holiday or via [facility] [phone] to contact the covering RD.

## 2020-07-01 NOTE — Progress Notes
Palliative Care Outpatient Consult Note    Location: [facility]     VIDEO TELEHEALTH VISIT: This clinician is part of the telehealth program and is conducting this visit in a currently approved location. For this visit the clinician and patient were present via interactive audio & video telecommunications system that permits real-time communications. Consent was signed via the Patient Acknowledgement and Financial Authorization Form and the Ambulatory Telehealth Consent Form.     State patient is located in: [place]  The clinician is appropriately licensed in the above state to provide care for this visit.     Other individuals present during the telehealth encounter and their role/relation: none    If billing based on time, please complete (Not required if billing based on MDM):                             Total time spent in medical video consultation: ; Total time spent by the provider on the day of service, which includes time spent on chart review, medical video consultation, education, coordination of care/services and counseling:     Because this visit was completed over video, a hands-on physical exam was not performed.  Patient/parent or guardian understands and knows to call back if condition changes.      Re: Paul Hunter (February 01, 1951)  MRN: [id number]  Provider: Barron Schmid, DO  Date of service: 06/27/2020    REASON FOR VISIT: Follow up visit    REASON FOR CONSULT: Symptom management  [] Pain  [] Non pain    REFERRING PROVIDER: No Referring    HPI:    CORDARYL DECELLES is a 69 y.o. male with past medical history of HTN, COPD, T2Dm, metastatic urothelial carcinoma. He was planned to start enfortumab but was hospitalized for dyspnea found to have enlargement of multiple lung mets. Patient was referred for support.    Patient is here for initial visit to palliative care clinic. I introduced our service and described palliative care as an extra layer of support for the patient and family, which may delivered concurrently with disease directed treatment. We spoke about our team's additional focus on assisting with symptom management.     He is familiar with our team, as we were consulted to see him on his most recent admission in Oct 2021.     He shares that he lives with niece and her boyfriend and her 17 yo daughter who have all been very supportive. Reports that he was recently seen by ortho spine doctor who didn't want to touch me. Back pain is better with prednisone but he is upset that he cannot be on consistent prednisone dose due to long term side effects. Reports that his legs from hips to ankles are weak. He has fallen 3 times, my legs give out. He is ambulating with walker and planning to rent a chair lift for staircase. PT/OT coming to the house. He is starting chemo for bladder cancer soon. States he hasnt had lung cancer treatment since July.    Additional Social hx:  Marital status: Divorced  Living situation: with spouse/partner  Current work status: disability  Children: Yes Son lives in [place], Dtr lives in [place]   Grandchildren: Yes    Palliative Care Symptom Assessment (See ESAS for numeric scores)  ? Sx:   o 7-10 = severe+++   o 4-6 = mod++  o 1-3 = mild+                  ?  Pain:  o Description: sharp pain  o Location: lower back  o Intensity: moderate   o Medications: oxycontin 10mg  q12HR and oxycodone 5mg  q4HR PRN  o Chandan Dave's CTPMP was reviewed via the Narxscore   Overdose Risk Score: 440    Fill Date ID   Written Drug Qty Days Prescriber Rx # Pharmacy Refill   Daily Dose* Pymt Type PMP     06/20/2020  2   06/16/2020  Oxycontin Er 10 MG Tablet  60.00  30 Mi Hur   [id number]   Con (8917)   0  30.00 MME  Comm Ins   [place]   06/16/2020  2   06/16/2020  Oxycodone Hcl 5 MG Tablet  200.00  25 Mi Hur   [id number]   Con (8917)   0  60.00 MME  Comm Ins   [place]   06/09/2020  2   06/09/2020  Oxycontin Er 10 MG Tablet  20.00  10 Mi Hur   [id number]   Con (8917)   0  30.00 MME Comm Ins   [place]   06/04/2020  2   06/04/2020  Oxycodone Hcl 5 MG Tablet  60.00  10 Va Des   [id number]   Con (8917)   0  45.00 MME  Comm Ins   [place]   06/03/2020  2   06/03/2020  Tramadol Hcl 50 MG Tablet  20.00  7 Ti Mul   [id number]   Con (8917)   0  14.29 MME  Comm Ins   [place]   05/25/2020  2   05/25/2020  Lorazepam 0.5 MG Tablet  15.00  4 Je Wat   [id number]   Con (8917)   0  1.88 LME  Comm Ins          Tiredness: +++ total exhaustion    Drowsiness: ++ can fall asleep during activities     Nausea/vomiting: --    Poor appetite/weight loss: +++ past week with chemo    Dyspnea: +    Depression: +++    Anxiety: +++    Constipation: --    Insomnia: +++ goes 2-3 days sleeping 4-5 hours    Concerns/worries: --    Cough: --    Other: --    Palliative Performance Scale (PPSv2)  [email or web address]  Palliative Performance Scale 60%  (Modification of [place] and was designed for measurement of physical status in Palliative Care. Only 10% of patients with PPS score <=50% would be expected to survive >6 months)  Performance Status ECOG: 2-3  0- Full activity  1- restricted strenuous activity  2- ambulatory, unable to work  3- limited self-care, bed-chair >50% waking hours  4-bed-chair confined            REVIEW OF SYSTEMS:  Review of Systems  A comprehensive 12-point review of systems was performed and is otherwise negative      PAST MEDICAL HISTORY:   Past Medical History:   Diagnosis Date   ? Anxiety    ? CAD (coronary artery disease)    ? Diabetes mellitus (HC Code) (HC CODE)    ? Difficult intubation 05/22/2020    Patient with significant soft tissue in oropharynx. Glidescope intubation revealed anterior cords with floppy soft tissue obstruction. Patient will require video laryngoscopy for airway management going forward.   ? GERD (gastroesophageal reflux disease)    ? Hyperlipidemia    ? Hypertension    ? Hypertrophy of prostate without urinary obstruction and other lower urinary tract symptoms (LUTS)    ?  Other testicular hypofunction    ? Spinal stenosis 09/28/2018   ? Tobacco abuse    ? Vitamin D deficiency        PAST SURGICAL HISTORY:   Past Surgical History:   Procedure Laterality Date   ? CAROTID STENT  08/28/2003   ? CHOLECYSTECTOMY  11/07/2016   ? KNEE SURGERY      torn meniscus   ? LAMINECTOMY  10/07/2008   ? LUMBAR DISC SURGERY      herniated disc       CURRENT MEDICATIONS:   No current facility-administered medications for this visit.     No current outpatient medications on file.     Facility-Administered Medications Ordered in Other Visits   Medication Dose Route Frequency Provider Last Rate Last Admin   ? acetaminophen (TYLENOL) tablet 650 mg  650 mg Oral Q6H PRN Link Snuffer, MD   650 mg at 06/30/20 2841   ? aspirin EC delayed release tablet 81 mg  81 mg Oral Daily Adekolu, [name], MD   81 mg at 06/30/20 1022   ? carboxymethylcellulose (REFRESH PLUS) 0.5 % ophthalmic solution in dropperette 1 drop  1 drop Both Eyes 4x Daily [place], [place], [place]   1 drop at 06/30/20 2200   ? carboxymethylcellulose (REFRESH PLUS) 0.5 % ophthalmic solution in dropperette 1 drop  1 drop Both Eyes TID PRN Robbi Garter, PA       ? cetirizine (ZyrTEC) tablet 5 mg  5 mg Oral Daily Adekolu, [name], MD   5 mg at 06/30/20 1022   ? clopidogreL (PLAVIX) tablet 75 mg  75 mg Oral Daily Axel Filler I, MD   75 mg at 06/30/20 1022   ? cyclopentolate (CYCLODRYL) 1 % ophthalmic solution 1 drop  1 drop Left Eye BID [place], [place], [place]   1 drop at 06/30/20 2200   ? [Held by provider] doxycycline TAB/CAP 100 mg  100 mg Oral Q12H Axel Filler I, MD   100 mg at 06/29/20 1009   ? escitalopram oxalate (LEXAPRO) tablet 10 mg  10 mg Oral Nightly Adekolu, [name], MD   10 mg at 06/30/20 2200   ? gabapentin (NEURONTIN) capsule 300 mg  300 mg Oral Nightly [place], [place], [place]   300 mg at 06/30/20 2200   ? heparin (porcine) injection 5,000 Units  5,000 Units Subcutaneous Q8H Adekolu, [name], MD 5,000 Units at 06/30/20 2200   ? ipratropium-albuteroL (DUO-NEB) 0.5 mg-3 mg(2.5 mg base)/3 mL nebulizer solution 3 mL  3 mL Nebulization Q6H WA Axel Filler I, MD   3 mL at 06/30/20 1954   ? [Held by provider] LORazepam (ATIVAN) tablet 0.5 mg  0.5 mg Oral Q6H PRN Adekolu, [name], MD       ? metoprolol succinate XL (TOPROL-XL) 24 hr tablet 25 mg  25 mg Oral Daily Advani, [name], MD   25 mg at 06/30/20 1022   ? mycophenolate mofetil (CELLCEPT) tablet 1 g  1 g Oral BID Axel Filler I, MD   1 g at 06/30/20 2200   ? oxybutynin XL (DITROPAN-XL) 24 hr tablet 5 mg  5 mg Oral Daily Adekolu, [name], MD   5 mg at 06/30/20 1022   ? oxyCODONE (OxyCONTIN) 12 hr tablet 10 mg  10 mg Oral Q12H Axel Filler I, MD   10 mg at 06/30/20 2200   ? oxyCODONE (ROXICODONE) Immediate Release tablet 5 mg  5 mg Oral Q6H PRN Robbi Garter, PA       ?  pantoprazole (PROTONIX) EC tablet 40 mg  40 mg Oral Daily Axel Filler I, MD   40 mg at 06/30/20 1022   ? polyethylene glycol (MIRALAX) packet 17 g  17 g Oral DAILY PRN Adekolu, [name], MD       ? prednisoLONE acetate (PRED FORTE) 1 % ophthalmic suspension 1 drop  1 drop Left Eye 4x Daily [place], [place], [place]   1 drop at 06/30/20 2200   ? predniSONE (DELTASONE) tablet 100 mg  100 mg Oral Daily Axel Filler I, MD   100 mg at 06/30/20 1022   ? [Held by provider] prochlorperazine (COMPAZINE) tablet 10 mg  10 mg Oral Q6H PRN Adekolu, [name], MD       ? rosuvastatin (CRESTOR) tablet 40 mg  40 mg Oral Daily Adekolu, [name], MD   40 mg at 06/30/20 1022   ? simethicone (MYLICON) chewable tablet 80 mg  80 mg Oral Q6H PRN Adekolu, [name], MD       ? sodium chloride 0.9 % flush 3 mL  3 mL IV Push Q8H Adekolu, [name], MD   3 mL at 06/30/20 1305   ? sodium chloride 0.9 % flush 3 mL  3 mL IV Push PRN for Line Care Adekolu, Lum Babe, MD       ? sodium chloride 0.9% infusion  75 mL/hr Intravenous Continuous Robbi Garter, [place] 75 mL/hr at 06/30/20 1457 75 mL/hr at 06/30/20 1457 ? tamsulosin (FLOMAX) 24 hr capsule 0.4 mg  0.4 mg Oral Nightly Adekolu, [name], MD   0.4 mg at 06/30/20 2200       ALLERGIES:   Patient has no known allergies.    SOCIAL HISTORY:   Social History     Socioeconomic History   ? Marital status: Divorced     Spouse name: Not on file   ? Number of children: Not on file   ? Years of education: Not on file   ? Highest education level: Not on file   Tobacco Use   ? Smoking status: Current Every Day Smoker     Types: Cigarettes   Social History Narrative    As of 2021: Was living?in [place]?and moved back to [place] to be near family in 2021. He moved into his niece's in-law apartment in [place] while undergoing cancer treatment. ?He is currently self sufficient and independent.    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    -VNA coming for med management and HHA assistance    -family very involved    -he is divorced and has 1 daughter with a 37 month old who lives 30 minutes away and a son with a 49 month old in [place]        Smoking hx 20 PYH    -current 1 PPD    -meeting with smoking cessation    EtOH - not much         Social Determinants of Psychologist, prison and probation services Strain:    ? Difficulty of Paying Living Expenses:    Food Insecurity:    ? Worried About Programme researcher, broadcasting/film/video in the Last Year:    ? Barista in the Last Year:    Transportation Needs:    ? Freight forwarder (Medical):    ? Lack of Transportation (Non-Medical):    Physical Activity:    ? Days of Exercise per  Week:    ? Minutes of Exercise per Session:    Stress:    ? Feeling of Stress :    Social Connections:    ? Frequency of Communication with Friends and Family:    ? Frequency of Social Gatherings with Friends and Family:    ? Attends Religious Services:    ? Active Member of Clubs or Organizations:    ? Attends Banker Meetings:    ? Marital Status:    Intimate Partner Violence:    ? Fear of Current or Ex-Partner:    ? Emotionally Abused:    ? Physically Abused:    ? Sexually Abused:        FAMILY HISTORY:  No family history on file.    PHYSICAL EXAM:  There were no vitals taken for this visit.  Physical Exam  General: Obese man sitting in chair, no acute distress.  Psych: calm, cooperative. Tearful and depressed with congruent affect. Good insight. Logical thought content and process.     LABORATORY DATA:  CBC:  Lab Results   Component Value Date    WBC 26.6 (H) 06/30/2020    RBC 3.2 (L) 06/30/2020    HGB 9.9 (L) 06/30/2020    HCT 30.6 (L) 06/30/2020    HCT 30.0 (L) 06/16/2020    MCV 95.9 (H) 06/30/2020    MCH 31.0 06/30/2020    MCHC 32.4 06/30/2020    PLT 181 06/30/2020    MPV 9.9 06/30/2020     CMP:  Lab Results   Component Value Date    GLU 108 (H) 06/30/2020    BUN 38 (H) 06/30/2020    CREATININE 1.80 (H) 06/30/2020    NA 142 06/30/2020    K 4.6 06/30/2020    CL 105 06/30/2020    CO2 27 06/30/2020    ALBUMIN 2.8 (L) 06/16/2020    PROT 5.7 (L) 06/16/2020    BILITOT 0.3 06/17/2020    ALKPHOS 125 (H) 06/17/2020    ALT 33 06/17/2020    ALT 14 08/14/2011    GLOB 2.9 06/16/2020    CALCIUM 8.5 (L) 06/30/2020    EGFR >60 08/14/2011     Palliative Care Labs Latest Ref Rng & Units 06/29/2020 06/02/2020 05/11/2020 04/14/2020 03/31/2020 03/17/2020 02/11/2020   TSH See Comment ?IU/mL 1.910 2.350 0.902 1.850 5.400(H) 2.210 4.480(H)            IMAGING REVIEW:  [place] ABDOMEN PELVIS W IV CONTRAST on 05/28/2020 12:38 AM  ?  INDICATION: Abdominal pain, acute, nonlocalized. Additional history per chart review: 69 y.o. 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   ?  COMPARISON: [facility] ABDOMEN PELVIS W IV CONTRAST 2021-Sep-20, 03/26/2020. Outside PET/[place] of 11/04/2019.  ?  TECHNIQUE: [place] images of the abdomen and pelvis were obtained after the administration of 100 mL intravenous contrast (Omnipaque 350).  ?  Technical limitations: None.  ?  FINDINGS:  ?  Lung bases: Extensive pulmonary nodules throughout the visualized lung bases measuring up to 2.0 cm in the left lower lobe again noted.  ?  Hepatobiliary: Unremarkable.  ?  Pancreas: Unremarkable.  ?  Spleen: Unremarkable.  ?  Adrenal glands: No change in subcentimeter nodule in the lateral limb of the right adrenal gland.  ?  Kidneys: Similar left renal atrophy with mild hydronephrosis and perinephric fat stranding. There is slight interval decrease in soft tissue at the ureteropelvic junction compared to prior likely reflecting postbiopsy changes. Slight asymmetric hypoenhancement of the left kidney also noted. The right kidney is unremarkable.  ?  Bowel: No bowel obstruction.  ?  Abdominal and pelvic lymph nodes: Stable left paraortic lymph nodes measuring up to 1.2 cm at the level of the kidneys (image 258, series 3). Unchanged numerous subcentimeter aorticocaval and retrocrural lymph nodes. Unchanged left peridiaphragmatic 1.1 cm lymph node.  ?  Peritoneum: No ascites.  ?  Vasculature: No abdominal aortic aneurysm.   ?  Pelvis: Mild wall thickening of the bladder base with nonvisualization of the previously seen bladder tumor consistent with interval resection of the bladder tumor. The prostate is unremarkable.  ?  Musculoskeletal system and soft tissue: No aggressive osseous lesion.  ?  IMPRESSION:  ?  1.  Bladder wall thickening at the base with nonvisualization of the previously seen bladder tumor consistent with interval resection.  2.  Slightly decreased soft tissue at the ureteropelvic junction likely reflecting interval biopsy/resection of the known tumor. Unchanged mild left hydronephrosis with left renal atrophy.  3.  No change in retroperitoneal lymph nodes.  4.  Redemonstration of metastatic lung disease.    IMPRESSION:     Paul Hunter is a 69 y.o. male with past medical history of HTN, COPD, T2Dm, metastatic urothelial carcinoma. He was planned to start enfortumab but was hospitalized for dyspnea found to have enlargement of multiple lung mets. Patient was referred for support.       ASSESSMENTS AND RECOMMENDATIONS:    1. Adjustment disorder with depressed mood  Continue lexapro 10mg  daily  Patient interested in talk therapy. Placed referral to our psychologist for CBT  - Psychology    2. Musculoskeletal pain  Non cancer low back pain  Medication management per current prescriber     3. Neuropathic pain  Agree with gabapentin 300mg  QHS    4. Palliative care encounter      GOALS OF CARE SUMMARY:    Understanding Fair. Aware he has 2 cancers and the prognosis is not good    Information preferences Wants to know what am I to expect, what is my body going to go through not interested in prognosis in terms of time   Prognosis Aware that it is incurable   Goals Wants to spend time with family, be more functionally independent    Fears/Worries Worried about inability to walk   Function Chair bound   Trade-offs Not explored in depth today   Family His niece is very supportive      ACP CHECKLIST:  I reviewed Ulus Hazen advance care plan section in EPIC  HCP: Yes [id number]:WRUEA [place]  Living will:  Yes   Code:  Not discussed     OPIOID RISK/SUBSTANCE USE SAFETY SCREENING: n/a not prescribing    Follow-up visit: Video 09/01/20      Barron Schmid, DO  Palliative Medicine Attending  [facility]  [facility] [place]

## 2020-07-01 NOTE — Plan of Care
Problem: Adult Inpatient Plan of CareGoal: Readiness for Transition of CareOutcome: Interventions implemented as appropriatePlan of Care Overview/ Patient Status    Pt not medically stable for discharge. Per MD weaning 02, steroid taper. Discharge plan pending most likely home with [facility] home health resumption of services SN, PT , OT. Case Management will continue to follow with team. Jeanice Lim MSN, RN, CCM [facility] Health Case ManagerOffice [phone]

## 2020-07-01 NOTE — Progress Notes
[facility]	 [facility]	Medicine Progress NoteHospitalist ServiceAttending Provider: Josph Macho, MD [phone]:  6822/6822-BHospital Day #3 Subjective: Patient seen and examined on 07/01/20 in AM.Interim History: EMR reviewed, noted overnight patient required straight cath again. Patient still slightly lethargic today and resting eyes at times during interview though does remain interactive and responding to questions. Patient tells me his pain is more controlled today after making adjustments in pain regimen yesterday. Patient denies any shortness of breath or dizziness currently. Review of Allergies/Meds/Hx: I have reviewed the patient's: allergies, current scheduled medications, current infusions and current prn medicationsObjective: Vitals:Temp:  [97.5 ?F (36.4 ?C)-98.3 ?F (36.8 ?C)] 98 ?F (36.7 ?C)Pulse:  [63-87] 87Resp:  [18-20] 20BP: (121-167)/(63-78) 167/74SpO2:  [93 %-96 %] 93 %Device (Oxygen Therapy): nasal cannulaO2 Flow (L/min):  [2] 2I/O's:I/O last 3 completed shifts:In: 800 [P.O.:800]Out: 1215 [Urine:1215]No intake/output data recorded.Physical Exam General:  Slightly lethargic and resting eyes at times during interview, lying in bed in NADHEENT:  ATPulm:  CTAB, no wheezes, rales, or rhonchi Cardiac:  RRR, S1S2 notedGI:  Soft, N/T, N/D, BS normoactive Extremities:  No LE edema bilaterally, LEs N/TSkin:  No rashes notedI have reviewed new labs and imaging over the last 24 hoursLabs:Recent Labs Lab 11/02/211133 11/03/210704 11/04/210523 11/05/210430 NA 137 138 142 142 K 4.4 3.8 4.6 3.8 CL 98 100 105 104 CO2 24 27 27 26  BUN 44* 46* 38* 27* CREATININE 2.36* 2.38* 1.80* 1.51* GLU 148* 95 108* 116* CALCIUM 8.5* 8.5* 8.5* 8.9 MG  --   --  2.4  --  Recent Labs Lab 11/02/211133 11/03/210704 11/04/210523 11/05/210430 WBC 36.4* 32.0* 26.6* 27.1* HGB 10.5* 10.1* 9.9* 10.1* HCT 31.4* 29.7* 30.6* 30.9* PLT 231 214 181 212 MCV 93.7 95.5* 95.9* 94.8* NEUTROPHILS 92.5*  --  89.8* 91.0* MONOCYTES 1.4* 2.0* 2.1* 2.0* Recent Labs Lab 11/02/211133 LABPROT 10.9 INR 1.00 Recent Labs Lab 11/02/211910 11/02/212158 11/04/210523 11/05/210430 TROPONINT 0.04* 0.03* 0.02* 0.02* ECG/Tele Events: I have reviewed the patient's ECG as resulted in the EMR. Tele: Sinus dysrhythmia with PVCs and occasional bigeminy, HR remains in the 40-50s at times and sometimes in the 100s Assessment: 69 y.o. 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 Noted to have elevated leukocytosis, procal as well as trop and probnp. CXR suggestive of pulmonary edema, superimposed on a background of COPD and innumerable lung metastases and possible PNA. COVID negative. Acute on hypoxemic respiratory failure felt to be multifactorial d/t pulmonary edema, progression of pulmonary mets, and ?superimposed PNA.Plan: 1. Hypoxemic respiratory failure - Felt to be multifactorial d/t pulmonary edema, progression of pulmonary mets, and ?superimposed PNA- Holding further abx at this time given no clear e/o infection and patient remains afebrile- Appreciate Cardiology Oncology input- continue steroid taper as per Team - Troponin T 0.05-->0.04-->0.03-->0.02-->0.02, monitoring ECGs- Monitoring on telemetry- Monitor strict I/Os and daily weights- Wean O2 as tolerated- Reviewed recent ECHO on 10/22 as well as repeat on 11/3- Smoking cessation provision 2. Pembrolizumab associated myocarditis- Appreciate Endocrinology input- free T4 sent and acceptable- Continue prednisone 100 mg and taper per Cards, seems was being tapered over the past few weeks by Cardiology Oncology- Toprol-XL 25 mg daily previously resumed- Orthostatics positive yesterday now s/p additional IVF. Consider repeating again today prior to adding back  ACE.- Monitoring on telemetry3. AKI on CKD- Appreciate Renal input- FENa calculated to be 0.6% indicating possibly pre-renal etiology though FEUrea >35% suggesting intrinsic component- Holding ACE and Bactrim for now - Monitor bladder scans for PVRs and straight cath PRN for >400 cc, continuing Flomax 0.4 mg nightly and consider Urology consult- Renal US significantly limited examination as described above, showed mild to moderate left hydronephrosis, which was also present on 05/28/2020- Monitor strict I/Os and daily weights4. Leukocytosis in setting of ?PNA and prednisone- Blood cx showing NGTD- Holding abx and monitoring for now. Leukocytosis difficult to interpret with steroid dosing and procal may also be up in part d/t AKI.5. Metastatic urothelial cancer with mets to lung and recent [place] showing progression of disease- Follow-up with Oncology for treatment plan- Continue long-acting oxycodone at home dosing and resumed short-acting oxycodone at 5 mg q6 hrs PRN for severe breakthrough pain for now- Continue Tylenol PRN for mild to moderate pain- Appreciate Palliative Care input- ordered lidocaine patch and consider further antianxiety agents over the next coming days 6. 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 DM- Noted last HgbA1c on 06/20/2020 6.9, therefore given well-controlled likely okay to continue monitoring BG on daily BMP for now and forego FS checks 8. F/E/N- Ordered Nutrition consult to help optimize PO intake- Continue Cardiac Diet - Monitor and replete lytes PRN, aiming for K>4 and Mg>2DISPO: Pending medically stable Full Code/ACLSDVT Prophylaxis: on heparin SC - Med Reconciliation completed by Pharmacist Signed:Erika Baranowski PA-C Please contact via Mobile HeartbeatAfter 16:30, please contact covering provider or provider assigned to the dynamic role of [facility] Hospitalist Coverage or [facility] Hospitalist Coverage.10:39 AMATTENDING ADDENDUMI saw and examined the patient on 07/01/2020 6:53 PM. I reviewed the chart including vitals, labs, and recent studies. I agree with the note above with modifications and additions as noted within the note and below. Patient overall feeling okay this morning.  Reports dyspnea with mild exertion but not at rest which is not new.  Sat 93% on 2 liters.  Breathing comfortably although has expiratory wheezes.  Appreciate urology, Foley placed for recurrent urinary retention and hydronephrosis. Repeat US in 48 hours.  AKI improving.  Holding diuretics for cardiology and nephrology.  If unable to wean oxygen, then will have to be discharged with new oxygen.  Please try to obtain compression stocking for orthostasis.  PT and OT consult.Josph Macho, MD

## 2020-07-01 NOTE — Plan of Care
Plan of Care Overview/ Patient Status    [phone] Pt alert and oriented x3, disoriented to time. Patient complained of pain, but controlled with scheduled medication. Pt had no other complaints. Pt did have positive orthostatic vital signs. MD aware. TED stockings ordered. Medications given as ordered per MAR.

## 2020-07-01 NOTE — Plan of Care
Plan of Care Overview/ Patient Status    1900-0700Pt AxOx4, pt stated he had a better day todayVSS on 2L NC. Tele in place HR 60-70s, brady @ times.CPAP put on patient around 2230.No c/o CP, N/V,+ DOEPt scheduled oxy given.Pt c/o 7/10 pain, PRN oxy given with +effect, pt asleep in bed. Pt voided 175 @ 2100, PVR 260, Pt cont of B/B when able to void ind., urinal/bedpan use due to dizziness when standing. No BM this shiftPt voided 100cc @ 0300, PVR 247. Pt voided 100 cc @ 0600, PVR 439, SC 300 removed and flow stopped, new PVR 40cc. Poor PO intake this shift, fluids/food encouragedOOB Ax1 with RW, except very dizzy and unstable when walking, used bedpan this shift. IVF running @ 75 mL/hr, DCed in AM. EKG completed. Morning labs sent.Hourly rounding complete, bed alarm on, call bell in reachSee flow sheets for more details.

## 2020-07-01 NOTE — Plan of Care
Problem: Adult Inpatient Plan of CareGoal: Plan of Care ReviewOutcome: Interventions implemented as appropriateGoal: Patient-Specific Goal (Individualized)Outcome: Interventions implemented as appropriateGoal: Absence of Hospital-Acquired Illness or InjuryOutcome: Interventions implemented as appropriateGoal: Optimal Comfort and WellbeingOutcome: Interventions implemented as appropriateGoal: Readiness for Transition of CareOutcome: Interventions implemented as appropriate Plan of Care Overview/ Patient Status    0700-1900A&Ox4, sleepy/ Lethargic but better than yesterday.  Pt very tearful; slightly anxious.  VSS; Pvcs on TELE.  SBP 90s-100s.  Sating 95% on 2 L O2.  Neb treatments q 4 hrs.  OOB turn and pivot Ax1; DOE; fatigued; needs recovery time.  Orthostatic positive.  Pt very shaky and SOB with any movement such as sitting on edge of bed or eating or rolling over.   Daughter and Nephew at bedside offering comfort/ support.  Pt eating 50% meals; encouraged po intake.  IVF running at 75cc/hr. Pt urinating 50-200 cc at a time; bladder scans for PVR 150-395.  I/O recorded.  C/O pain 4-7; oxycontin and breakthrough oxycodone given with good effect.  New IV placed after pt pulled out by accident.  Palliative care, Cardiology, Opthalmology, and Nephrology at bedside today.   Bed alarm set for safety.  See flowsheets for further details.  BP 133/78  - Pulse 72  - Temp 98.3 ?F (36.8 ?C) (Oral)  - Resp 20  - Wt 128.5 kg  - SpO2 95%  - BMI 38.42 kg/m?

## 2020-07-01 NOTE — Progress Notes
[facility]	 [facility]	Medicine Progress NoteHospitalist ServiceAttending Provider: Josph Macho, MD [phone]:  6822/6822-BHospital Day #2 Subjective: Patient seen and examined on 06/30/20 in AM.Interim History: EMR reviewed. Patient more awake and alert today, still tired and resting eyes at times during interview though overall more interactive. Patient admits to left-sided rib pain where he fell prior to admission, requesting home dose of oxycodone for breakthrough pain. Patient denies any chest pain or shortness of breath currently at rest. Per D/W RN, patient becomes easily tired and dyspneic with any kind of activity however has not been up yet so unable to say if still having the dizziness. Patient remains tearful at times, denies SI/HI and reports that he is feeling the opposite and wants to fight.Review of Allergies/Meds/Hx: I have reviewed the patient's: allergies, current scheduled medications, current infusions and current prn medicationsObjective: Vitals:Temp:  [97.8 ?F (36.6 ?C)-98.5 ?F (36.9 ?C)] 97.8 ?F (36.6 ?C)Pulse:  [55-81] 75Resp:  [14-20] 19BP: (103-138)/(58-73) 129/69SpO2:  [95 %-98 %] 95 %Device (Oxygen Therapy): nasal cannulaO2 Flow (L/min):  [2] 2I/O's:I/O last 3 completed shifts:In: 250 [P.O.:250]Out: 1115 [Urine:1115]I/O this shift:In: - Out: 250 [Urine:250]Physical Exam General:  More awake and alert today, still tired and resting eyes at times during interview though overall more interactive, lying in bed in NADHEENT:  AT, left eye conjunctiva with erythematous area notedPulm:  CTAB, no wheezes, rales, or rhonchi  Cardiac:  RRR, S1S2 notedGI:  Soft, N/T, N/D, BS normoactive Extremities:  Trace LE pedal edema, LEs N/T, moving all four extremitiesSkin:  No rashes notedI have reviewed new labs and imaging over the last 24 hoursLabs:Recent Labs Lab 11/02/211133 11/03/210704 11/04/210523 NA 137 138 142 K 4.4 3.8 4.6 CL 98 100 105 CO2 24 27 27  BUN 44* 46* 38* CREATININE 2.36* 2.38* 1.80* GLU 148* 95 108* CALCIUM 8.5* 8.5* 8.5* MG  --   --  2.4 Recent Labs Lab 11/02/211133 11/03/210704 11/04/210523 WBC 36.4* 32.0* 26.6* HGB 10.5* 10.1* 9.9* HCT 31.4* 29.7* 30.6* PLT 231 214 181 MCV 93.7 95.5* 95.9* NEUTROPHILS 92.5*  --  89.8* MONOCYTES 1.4* 2.0* 2.1* Recent Labs Lab 11/02/211133 LABPROT 10.9 INR 1.00 Recent Labs Lab 11/02/211441 11/02/211910 11/02/212158 11/04/210523 TROPONINT 0.05* 0.04* 0.03* 0.02* Diagnostics:Renal US (06/29/20):Findings:?Examination is limited secondary to patient body habitus and overlying bowel gas. This significantly limits evaluation the left kidney.?The right kidney measures 13.3 cm in length. There is no hydronephrosis or nephrolithiasis. A right midpole renal cyst is noted measuring up to 2.4 cm.?The left kidney measures 12.1 cm in length. Evaluation the left kidney is limited as described above. However, there is mild to moderate hydronephrosis within the left kidney. ?The bladder is partially distended. The bilateral ureteral jets are absent, likely related to technique.?IMPRESSION:??Significantly limited examination as described above. Mild to moderate left hydronephrosis, which was also present on 05/28/2020.?Report Initiated By:  Leatrice Jewels, MD?Reported And Signed By: Zacarias Pontes, MD  [facility] Radiology and Biomedical ImagingECHO (06/29/20): * Limited TTE to evaluate ventricular function.* Normal left ventricular size and systolic function. No regional wall motion abnormalities.  LVEF calculated by biplane Simpson's was 70%.  No thrombus visualized in the left ventricle.* Suboptimal visualization of the right ventricle, but it appears to be normal size with normal function.* IVC diameter < 2.1 cm that collapses > 50% with a sniff suggests normal RAP (0-5 mmHg, mean 3 mmHg).* No significant pericardial effusion.* Compared with prior study dated 06/17/2020: Right ventricle was suboptimally visualized on both studies but appears normal on current study.ECG/Tele Events: I have  reviewed the patient's ECG as resulted in the EMR and discussed with Cardiology Oncology, will repeat this afternoon per Team. Tele: Sinus dysrhythmia with PVCs and occasional bigeminy, ?ST depressions earlier today, and HR in the high 40-50s at timesAssessment: 69 y.o. 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 Noted to have elevated leukocytosis, procal as well as trop and probnp. CXR suggestive of pulmonary edema, superimposed on a background of COPD and innumerable lung metastases and possible PNA. COVID negative. Acute on hypoxemic respiratory failure felt to be multifactorial d/t pulmonary edema, progression of pulmonary mets, and ?superimposed PNA.Plan: 1. Hypoxemic respiratory failure - Felt to be multifactorial d/t pulmonary edema, progression of pulmonary mets, and ?superimposed PNA- Holding further abx at this time given no clear e/o infection and patient remains afebrile- Appreciate Cardiology Oncology input- will repeat ECG again this after and check Mg then replete as appropriate, continue steroid taper as per Team - Troponin T 0.05-->0.04-->0.03-->0.02- Monitoring on telemetry- Monitor strict I/Os and daily weights- Wean O2 as tolerated- Reviewed recent ECHO on 10/22 as well as repeat test above- Smoking cessation provision 2. Pembrolizumab associated myocarditis- Appreciate Endocrinology input- free T4 sent and acceptable- Continue prednisone 100 mg and taper per Cards, seems was being tapered over the past few weeks by Cardiology Oncology- Resumed Toprol-XL 25 mg daily, orthostatics pending- Monitoring on telemetry3. AKI on CKD- Appreciate Renal input- holding off on further diuresis and IVF for now, CK normal- FENa calculated to be 0.6% indicating possibly pre-renal etiology though FEUrea >35% suggesting intrinsic component- Holding ACE and Bactrim for now - Monitor bladder scans for PVRs and straight cath PRN for >400 cc, consider Urology consult and starting Flomax tonight - Renal US significantly limited examination as described above, showed mild to moderate left hydronephrosis, which was also present on 05/28/2020- Monitor strict I/Os and daily weights4. Leukocytosis in setting of ?PNA and prednisone- Blood cx showing NGTD- Holding abx and monitoring for now. Leukocytosis difficult to interpret with steroid dosing and procal may also be up in part d/t AKI.5. Metastatic urothelial cancer with mets to lung and recent [place] showing progression of disease- Follow-up with Oncology for treatment plan- Continue long-acting oxycodone at home dosing and consider resuming short-acting oxycodone as above (one time dose ordered for now and discussing further with Attending re: Palliative Care involvement)6. 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 DM- Noted last HgbA1c on 06/20/2020 6.9, therefore given well-controlled likely okay to continue monitoring BG on daily BMP for now and forego FS checks 8. F/E/N- Ordered Nutrition consult to help optimize PO intake- Continue Cardiac Diet DISPO: Pending medically stable Full Code/ACLSDVT Prophylaxis: on heparin SC - Med Reconciliation completed by Pharmacist Signed:Erika Baranowski PA-C Please contact via Mobile HeartbeatAfter 16:30, please contact covering provider or provider assigned to the dynamic role of [facility] Hospitalist Coverage or [facility] Hospitalist Coverage.12:32 PMATTENDING ADDENDUMI saw and examined the patient on 06/30/2020 1-2 PM. I reviewed the chart including vitals, labs, and recent studies. I agree with the note above with modifications and additions as noted within the note and below.  Daughter at bedside. Patient is tearful.  Declining functional status.  This is the patient's 6th hospitalization since Sept 2021.  Sat 95% on 2 liters NC.  Creatinine improving.  Agree no clear evidence of infection.  Will watch off antibiotics.  Palliative care consulted for pain management in the setting of metastatic malignancy, providing support to help patient cope with advanced illness.Josph Macho, MD

## 2020-07-02 ENCOUNTER — Inpatient Hospital Stay: Admit: 2020-07-02 | Payer: PRIVATE HEALTH INSURANCE

## 2020-07-02 LAB — COMPREHENSIVE METABOLIC PANEL
BKR A/G RATIO: 0.9 x 1000/??L — ABNORMAL LOW (ref 1.0–2.2)
BKR ALANINE AMINOTRANSFERASE (ALT): 58 U/L (ref 9–59)
BKR ALBUMIN: 2.9 g/dL — ABNORMAL LOW (ref 3.6–4.9)
BKR ALKALINE PHOSPHATASE: 166 U/L — ABNORMAL HIGH (ref 9–122)
BKR ANION GAP: 13 (ref 7–17)
BKR ASPARTATE AMINOTRANSFERASE (AST): 26 U/L — ABNORMAL HIGH (ref 10–35)
BKR AST/ALT RATIO: 0.4 x 1000/??L (ref 0.0–1.0)
BKR BILIRUBIN TOTAL: 0.3 mg/dL (ref <=1.2)
BKR BLOOD UREA NITROGEN: 22 mg/dL — ABNORMAL HIGH (ref 8–23)
BKR BUN / CREAT RATIO: 16.8 (ref 8.0–23.0)
BKR CALCIUM: 8.8 mg/dL (ref 8.8–10.2)
BKR CHLORIDE: 107 mmol/L — ABNORMAL HIGH (ref 98–107)
BKR CO2: 23 mmol/L (ref 20–30)
BKR CREATININE: 1.31 mg/dL — ABNORMAL HIGH (ref 0.40–1.30)
BKR EGFR (AFR AMER): >60 mL/min/{1.73_m2} (ref >60)
BKR EGFR (NON AFRICAN AMERICAN): 55 mL/min/{1.73_m2} (ref >60)
BKR GLOBULIN: 3.2 g/dL (ref 2.3–3.5)
BKR GLUCOSE: 136 mg/dL — ABNORMAL HIGH (ref 70–100)
BKR POTASSIUM: 4.2 mmol/L (ref 3.3–5.3)
BKR PROTEIN TOTAL: 6.1 g/dL — ABNORMAL LOW (ref 6.6–8.7)
BKR SODIUM: 143 mmol/L (ref 136–144)
BKR WAM ABSOLUTE NRBC: 0.3 mg/dL — ABNORMAL LOW (ref <=1.2)
BKR WAM MCHC: 1.31 mg/dL — ABNORMAL HIGH (ref 0.40–1.30)

## 2020-07-02 LAB — MANUAL DIFFERENTIAL
BKR WAM BASOPHIL - ABS (DIFF): 0 x 1000/ÂµL (ref 0.0–0.0)
BKR WAM BASOPHILS (DIFF): 0 % (ref 0.0–4.0)
BKR WAM EOSINOPHIL - ABS (DIFF): 0 x 1000/??L (ref 0.0–1.0)
BKR WAM EOSINOPHILS (DIFF): 0 % — ABNORMAL HIGH (ref 0.0–7.0)
BKR WAM LYMPHOCYTE - ABS (DIFF): 1.6 x 1000/ÂµL (ref 1.0–4.0)
BKR WAM LYMPHOCYTES (DIFF): 4 % — ABNORMAL LOW (ref 8.0–49.0)
BKR WAM MONOCYTE - ABS (DIFF): 0.8 x 1000/ÂµL (ref 0.0–2.0)
BKR WAM MONOCYTES (DIFF): 2 % — ABNORMAL LOW (ref 4.0–15.0)
BKR WAM NEUTROPHILS (DIFF): 95 % — ABNORMAL HIGH (ref 37.0–84.0)
BKR WAM NEUTROPHILS - ABS (DIFF): 36.9 x 1000/ÂµL — ABNORMAL HIGH (ref 1.0–11.0)

## 2020-07-02 LAB — CBC WITH AUTO DIFFERENTIAL
BKR WAM HEMATOCRIT: 27.8 % — ABNORMAL LOW (ref 37.0–52.0)
BKR WAM HEMOGLOBIN: 9 g/dL — ABNORMAL LOW (ref 12.0–18.0)
BKR WAM MCH (PG): 30.7 pg (ref 27.0–31.0)
BKR WAM MCV: 94.9 fL — ABNORMAL HIGH (ref 78.0–94.0)
BKR WAM MPV: 9.8 fL (ref 6.0–11.0)
BKR WAM NUCLEATED RED BLOOD CELLS: 0.1 % — ABNORMAL LOW (ref 0.0–1.0)
BKR WAM PLATELETS: 235 x1000/??L (ref 140–440)
BKR WAM RDW-CV: 15 % — ABNORMAL HIGH (ref 11.5–14.5)
BKR WAM RED BLOOD CELL COUNT: 2.9 M/??L — ABNORMAL LOW (ref 3.8–5.9)
BKR WAM WHITE BLOOD CELL COUNT: 38.8 x1000/ÂµL — ABNORMAL HIGH (ref 4.0–10.0)

## 2020-07-02 LAB — TROPONIN T     (Q)
BKR TROPONIN T: 0.02 ng/mL — ABNORMAL HIGH (ref <0.01)
BKR TROPONIN T: 0.02 ng/mL — ABNORMAL HIGH (ref <0.01)

## 2020-07-02 MED ORDER — FUROSEMIDE 10 MG/ML INJECTION SOLUTION
10 mg/mL | Freq: Once | INTRAVENOUS | Status: CP
Start: 2020-07-02 — End: ?
  Administered 2020-07-02: 23:00:00 10 mL via INTRAVENOUS

## 2020-07-02 NOTE — Other
[facility]    [facility]     Urology Consult Note    Subjective:     Reason for Consultation: Urinary retention and left hydronephrosis     HPI: Paul Hunter is a 69 y.o. male with a PMHx of  Mets invasive high grade urothelial carcinoma s/p 9/26 TURBT of 3x4 cm papillary tumor involving the left trigone and left lateral bladder wall with left ureteral orifice obscured by tumor  (originally thought to have Stage IV NSCLC, after p/w DOE in 06/2019, PET 11/04/19 w/ hyperavid lung nodules w/ diffuse adenopathy in neck/chest and upper abdomen, s/p infraclavicular LN bx 11/18/19 w/ focal atypical cells c/f carcinoma, s/p EBUS w/ bx of station 7 LN c/w metastatic SCC; s/p Carbo/Taxol/Pembro x4 cycles (11/2019-02/2020 c/c/b neuropathy requiring dose reduction and 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 (on steroid taper)), s/p repeat EBUS 9/28 with RLL FNA/Wash positive for malignant cells (similar morphologically to bladder pathology, therefore felt to be metastatic carcinoma from known urothelial primary)), HFpEF, COPD, obesity, CAD s/p PCI with 3 stents placed in 2014 (on ASA; plavix on hold d/t hematuria with clots), GERD, sleep apnea uses cpap, 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 Urology consulted for urinary retention and mild to moderate hydronephrosis on renal ultrasound     On evaluation, patient was afebrile, HDS although currently on 2L NC. Labs significant for Cr 1.51 downtrending daily (b/l 1.4), WBC 27.1, UA no bacteria, positive for leukocytes, negative for nitrite. Renal US with mild to moderate hydronephrosis within the left kidney    Patient denies flank pain, dysuria, hematuria. Patient has required two straight catheterizations in the last day. He has mostly been bed bound given his hypoxia and has been constipated for three days.     Medical History:     PMH PSH   Past Medical History:   Diagnosis Date   ? Anxiety    ? CAD (coronary artery disease)    ? Diabetes mellitus (HC Code) (HC CODE)    ? Difficult intubation 05/22/2020    Patient with significant soft tissue in oropharynx. Glidescope intubation revealed anterior cords with floppy soft tissue obstruction. Patient will require video laryngoscopy for airway management going forward.   ? GERD (gastroesophageal reflux disease)    ? Hyperlipidemia    ? Hypertension    ? Hypertrophy of prostate without urinary obstruction and other lower urinary tract symptoms (LUTS)    ? Other testicular hypofunction    ? Spinal stenosis 09/28/2018   ? Tobacco abuse    ? Vitamin D deficiency       Past Surgical History:   Procedure Laterality Date   ? CAROTID STENT  08/28/2003   ? CHOLECYSTECTOMY  11/07/2016   ? KNEE SURGERY      torn meniscus   ? LAMINECTOMY  10/07/2008   ? LUMBAR DISC SURGERY      herniated disc        Social History Family History   Social History     Tobacco Use   ? Smoking status: Current Every Day Smoker     Types: Cigarettes   Substance Use Topics   ? Alcohol use: Not on file  No family history on file.       Prior to Admission Medications   Medications Prior to Admission   Medication Sig Dispense Refill Last Dose   ? albuterol sulfate 90 mcg/actuation HFA aerosol inhaler Inhale 2 puffs into the lungs every 6 (six) hours as needed for wheezing or shortness of breath. 6.7 g 0    ? aspirin 81 MG EC tablet Take 81 mg by mouth daily.      ? atorvastatin (LIPITOR) 80 mg tablet Take 1 tablet (80 mg total) by mouth nightly. 30 tablet 6    ? calcium carbonate-vitamin D3 500 mg(1,250mg ) -400 unit per tablet Take 1 tablet by mouth 2 (two) times daily. 60 tablet 2    ? carboxymethylcellulose (REFRESH PLUS) 0.5 % ophthalmic solution in dropperette Place 1 drop into both eyes 3 (three) times daily as needed.      ? clopidogreL (PLAVIX) 75 mg tablet Take 75 mg by mouth daily.      ? escitalopram oxalate (LEXAPRO) 10 mg tablet Take 1 tablet (10 mg total) by mouth nightly. 90 tablet 3    ? fexofenadine (ALLEGRA) 180 mg tablet Take 180 mg by mouth daily as needed.       ? furosemide (LASIX) 40 mg tablet Take 40 mg by mouth daily.      ? gabapentin (NEURONTIN) 300 mg capsule Take 2 capsules (600 mg total) by mouth nightly. 60 capsule 0    ? lisinopriL (PRINIVIL,ZESTRIL) 2.5 mg tablet Take 1 tablet (2.5 mg total) by mouth daily. Hold until follow-up with Cardiology 30 tablet 11    ? LORazepam (ATIVAN) 0.5 mg tablet Take 1 tablet (0.5 mg total) by mouth every 6 (six) hours as needed for anxiety. 15 tablet 0    ? metoprolol succinate XL (TOPROL-XL) 25 mg 24 hr tablet Take 0.5 tablets (12.5 mg total) by mouth daily. Hold until follow-up with Cardiology 30 tablet 0    ? mycophenolate mofetil (CELLCEPT) 500 mg tablet Take 2 tablets (1 g total) by mouth 2 (two) times daily. 120 tablet 0    ? nicotine (NICODERM CQ) 21 mg transdermal patch APPLY 1 PATCH DAILY. ROTATE PATCH SITES. LEAVE ON FOR 24 HOURS UNLESS OTHERWISE DIRECTED.      ? nicotine polacrilex (COMMIT) 2 mg lozenge Place 1 lozenge (2 mg total) inside cheek every 2 (two) hours as needed for smoking cessation. 108 each 2    ? oxybutynin XL (DITROPAN-XL) 5 mg 24 hr tablet Take 1 tablet (5 mg total) by mouth daily. 90 tablet 0    ? oxyCODONE (OXYCONTIN) 10 mg 12 hr extended release tablet Take 1 tablet (10 mg total) by mouth every 12 (twelve) hours. 60 tablet 0    ? oxyCODONE (ROXICODONE) 5 mg Immediate Release tablet Take 1 tablet (5 mg total) by mouth every 3 (three) hours as needed for up to 10 doses. 200 tablet 0    ? pantoprazole (PROTONIX) 40 mg tablet Take 1 tablet (40 mg total) by mouth daily. 30 tablet 2 ? polyethylene glycol (MIRALAX) 17 gram packet Take 1 packet (17 g total) by mouth daily as needed for constipation. Mix in 8 ounces of water, juice, soda, coffee or tea prior to taking. 14 each 0    ? [START ON 08/09/2020] predniSONE (DELTASONE) 10 mg tablet Take 3 tablets (30 mg total) by mouth daily for 7 days, THEN 2 tablets (20 mg total) daily for 7 days, THEN 1 tablet (10 mg total) daily for 7  days, THEN 0.5 tablets (5 mg total) daily for 7 days, THEN 0.5 tablets (5 mg total) daily for 7 days. Take with food.. 49 tablet 0    ? prochlorperazine (COMPAZINE) 10 mg tablet Take 1 tablet (10 mg total) by mouth every 6 (six) hours as needed for nausea. 30 tablet 5    ? senna-docusate (SENNA-PLUS) 8.6-50 mg tablet Take 1 tablet by mouth nightly. 30 tablet 0    ? simethicone (MYLICON) 80 mg chewable tablet Take 1 tablet (80 mg total) by mouth every 6 (six) hours as needed for flatulence. 30 tablet 0    ? sulfamethoxazole-trimethoprim (BACTRIM DS;CO-TRIMOXAZOLE DS) 800-160 mg per tablet Take 1 tablet by mouth Every Monday, Wednesday, and Friday. 28 tablet 0    ? tamsulosin (FLOMAX) 0.4 mg 24 hr capsule Take 1 capsule (0.4 mg total) by mouth nightly. 30 capsule 2    ? torsemide (DEMADEX) 20 mg tablet Take 1 tablet (20 mg total) by mouth daily. 30 tablet 0    ? belladonna alkaloids-opium (B&O SUPPRETTES) 16.2-30 mg suppository Place 1 suppository rectally 2 (two) times daily as needed for bladder spasm. 12 suppository 0    ? walker Misc Use as directed. 1 each 0           Allergies   No Known Allergies       Review of Systems:     A 12 point review of systems was conducted that was negative unless noted in the HPI    Objective:     Vitals:  Temp:  [97.5 ?F (36.4 ?C)-98.4 ?F (36.9 ?C)] 97.9 ?F (36.6 ?C)  Pulse:  [61-100] 61  Resp:  [18-20] 18  BP: (91-167)/(45-78) 129/70  SpO2:  [88 %-96 %] 94 %  Device (Oxygen Therapy): room air  O2 Flow (L/min):  [2] 2    Ins/Outs:    Intake/Output Summary (Last 24 hours) at 07/01/2020 1618  Last data filed at 07/01/2020 1400  Gross per 24 hour   Intake 330 ml   Output 1040 ml   Net -710 ml       Physical Exam:     Gen: NAD, AOx3  HEENT: NCAT  CVS: Regular rate  Pulm: Breathing through a NC  Abd: Soft, non-distended, mild tenderness to palpation of LLQ. No flank tenderness bilaterally   Ext: WWP    Labs:   Recent Labs   Lab 06/29/20  0704 06/29/20  0704 06/30/20  0523 06/30/20  0523 07/01/20  0430   WBC 32.0*  --  26.6*  --  27.1*   HGB 10.1*   < > 9.9*   < > 10.1*   HCT 29.7*   < > 30.6*   < > 30.9*   PLT 214   < > 181   < > 212    < > = values in this interval not displayed.    Recent Labs   Lab 06/28/20  1133 06/30/20  0523 07/01/20  0430   NEUTROPHILS 92.5* 89.8* 91.0*      Recent Labs   Lab 06/29/20  0704 06/29/20  0704 06/30/20  0523 06/30/20  0523 07/01/20  0430   NA 138  --  142  --  142   K 3.8   < > 4.6   < > 3.8   CL 100   < > 105   < > 104   CO2 27   < > 27   < > 26   BUN 46*   < >  38*   < > 27*   CREATININE 2.38*   < > 1.80*   < > 1.51*   GLU 95   < > 108*   < > 116*   ANIONGAP 11   < > 10   < > 12    < > = values in this interval not displayed.    Recent Labs   Lab 06/29/20  0704 06/30/20  0523 06/30/20  0523 07/01/20  0430   CALCIUM 8.5* 8.5*  --  8.9   MG  --  2.4   < > 2.3    < > = values in this interval not displayed.      No results for input(s): ALT, AST, ALKPHOS, BILITOT, BILIDIR in the last 168 hours. Recent Labs   Lab 06/28/20  1133   LABPROT 10.9   INR 1.00        Micro:  Lab Results   Component Value Date    LABBLOO No Growth to Date 06/28/2020    LABBLOO No Growth to Date 06/28/2020    LABURIN No Growth 06/17/2020        Diagnostics:  No results found.    Assessment:     69 yo extensive including PMH of metastatic invasive high grade urothelial carcinoma s/p TURBT involving the left ureteral orifice, now admitted for myocarditis and hypoxemic respiratory failure. Urology consulted for mild to moderate hydronephrosis and urinary retention. I had a long discussion with the patient regarding his urinary retention and the meaning of his hydronephrosis. It may be likely that his urinary retention is due to being relatively bed bound and constipated. There are a few possible reasons he may have hydronephrosis. The first may be that he has urinary retention, ureteral reflux, ureteral obstruction from a recurrence or his prior resection. Would recommend foley placement and repeat ultrasound in two days unless patient has increase in Cr or acutely decompensates.     Plan:     - Recommend foley placement   - Monitor urine output, Monitor Cr   - Repeat renal bladder ultrasound imaging in 48 hours   - Discussed with urology attending, Dr. Malen Gauze     Signed:  Emalina Dubreuil D. Renee Pain MD, MBA  PGY-2   Consult pager: [phone]

## 2020-07-02 NOTE — Progress Notes
[facility]	 [facility]	Medicine Progress NoteHospitalist ServiceAttending Provider: Simeon Craft, MD [phone]:  [id number] Day #4 Subjective: CC/Interim History/ROS: Patient seen early this morning as he was agitated and apparently trying to leave. On my arrival patient was back and bed but extremely anxious and confused about where he was. States he wanted to call his oncologist and also his son, but no one is answering. States  I feel like there is something they aren't telling me. I am not ready to die. Reassurance provided and patient calmed down. Re-evaluated patient in the afternoon and he is much calmer and less confused. States he doesn't remember what happened this morning. He denies acute complaints but wants to rest. Review of Allergies/Meds/Hx: I have reviewed the patient's: allergies, current scheduled medications, current infusions, and current prn medicationsObjective: Vitals:Last 24 hours: Temp:  [97.8 ?F (36.6 ?C)-98.8 ?F (37.1 ?C)] 98.8 ?F (37.1 ?C)Pulse:  [50-99] 61Resp:  [18-22] 22BP: (106-155)/(54-82) 123/72SpO2:  [92 %-97 %] 92 %I/O's:I/O last 3 completed shifts:In: - Out: 275 [Urine:275]Weight: 128.5 kgPhysical Exam Physical ExamConstitutional:     Appearance: He is obese. HENT:    Head: Normocephalic and atraumatic. Eyes:    Conjunctiva/sclera: Conjunctivae normal. Cardiovascular:    Rate and Rhythm: Normal rate and regular rhythm.    Heart sounds: No murmur heard. Pulmonary:    Comments: Expiratory WheezeAbdominal:    General: Bowel sounds are normal. There is no distension.    Palpations: Abdomen is soft.    Tenderness: There is no abdominal tenderness. Musculoskeletal:       General: Swelling (bilateral LE ) present. No tenderness. Skin:   General: Skin is warm and dry. Neurological:    Mental Status: He is alert. Comments: A/ox3  Assessment: 69 y.o. 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 Found to have worsening metastatic lung disease, pulmonary edema and possible PNA. Overall improving but had episode of confusion this morning. Plan: #Hypoxemic Respiratory Failure - Likely multifactorial I/s/o progression of mets, ?PNA and pulmonary edema - Remains stable on room air but continues to have expiratory wheeze- Repeat CXR today revealed Increasing interstitial opacities - may represent increasing pulmonary edema. Innumerable scattered bilateral pulmonary nodules- Will give lasix IV 40 mg x1 as more tachypnea and worsening pulmonary edema. Do not think this will greatly effect orthostatic hypotension as it seems more autonomic in nature 	- Re-eval tomorrow for further diuresis - Cont zyrtec- Cont duonebs q6 WA#Pembrolizumab Associated Myocarditis w/ hx of HF- Appreciate Cardio-Oncology consult - Cont ASA and plavix - Cont 100 mg prednisone daily (day 4/7) prednisone taper 10 mg/week - Cont mycophenolate - Cont metoprolol 25 mg daily - Cont rosuvastatin - Will need OP card-onc f/u #AKI on CKD - Appreciate renal and Urology consunlt - Cr 2.38 on admission --> 1.31 today - Likely pre-renal, but ? Component as obstruction as there is hydronephrosis - Bl Cr 1.5- Renal US w/ mild-moderate L hyronephrosis - Foley inserted today - Will need repeat US on Sunday - OP f/u w/ Dr. Nydia Bouton Urothelial Cancer w/ mets to lungs- Appreciate Palliative Care consult - Mets invasive high grade urothelial carcinoma s/p 9/26 TURBT and Carbo/Taxol/Pembro x4 cycles- Cont oxycontin 10 mg BID- Cont oxycodone 5 mg q6 hours prn -  Consider oncology consult- F/u w/ Dr. Hurwitz#Leukocytosis - WBC 38.8 and continues to rise- Difficult to interpret as he is on high dose steroids - Remains afebrile - Bld Cx 11/2 NGTD, MRSA PCR Negative - Cont to monitor off abx#Orthostatic Hypotension - Likely autonomic - Cont compression stocking - Check daily orthostatics #Blurry Vision L eye, Traumatic Iritis - Appreciat Ophthalmology consult - L Eye:	- Sp cyclopentolate 1 drop two times a day (RED TOP) for 2 days	- Continue prednisolone acetate 1% 1 drop QID  (day 2/5)- Both Eyes:	- Cont refresh gtt- Will need to f/u OP #Depression, Anxiety - Cont lexapro 10 mg nightly - Cont gabapentin 300 mg nightly #OSA- Cont CPAP #BPH- Cont tamsulosin - Cont oxybutynin #DM2- Noted last HgbA1c on 06/20/2020 6.9, therefore given well-controlled likely okay to continue monitoring BG on daily BMP for now and forego FS checks F/E/N:- Cardiac Diet - Cont protonix DVT Prophylaxis: SQHMobility: OOB w/ assist - PT/OT following Dispo: Pending clinical improvement Full Code/ACLSSigned:Rebecca McCurdy, PA-CAfter 1800, please contact covering provider or Hospitalist in Dynamic Role3:39 PMATTENDING ADDENDUM:Patient seen and examined independently by me. I have reviewed the above stated history, physical examination, laboratory/diagnostic data in detail. Care plan discussed with the APP, I agree with the plan of care as outlined above.

## 2020-07-02 NOTE — Plan of Care
Plan of Care Overview/ Patient Status    BP (!) 155/80  - Pulse 89  - Temp 98.8 ?F (37.1 ?C) (Oral)  - Resp 20  - Wt 128.5 kg  - SpO2 94%  - BMI 38.42 kg/m? A+OX3. Niece at bedside at change of shift. Remains on 2L of NC. Refused BIPAP overnight. All medications given as ordered. POC reviewed with niece and patient. Verbalized understanding. TED stockings at bedside but patient requested they be placed in the morning. Pain well managed with oxycontin as ordered. Cardiac monitor continued. Hep locked bu patent and flushed without difficulty. Occasional incontinence and care provided prn. Questions encouraged. Bed in lowest position.WCTMJuan C Annalyse Langlais, RN2:53 AMPatient complain of SOB. Encouraged patient to allow BIPAP and agreeable to plan. BIPAP placed with good effect. Junie Bame, RN

## 2020-07-02 NOTE — Plan of Care
 [facility]		Spiritual Care NotePurpose:   Observation: People present/  Emotional         Subjective: Paul Hunter was alert and oriented. His daughter-in-law was at the bedside. Since patient could not take food, chaplain prayed the Prayer for Spiritual Holy Communion. Spiritual Assessment:            Spiritual Interventions:Spiritual Intervention Index**Date of Spiritual Visit: 11/06/21Visit Type: Initial VisitLanguage or special accommodation rendered?: NoIntervention Type: Ritual/SacramentResponding Chaplain: Other ChaplainType of Ritual/Sacrament: CommunionDate of Communion Given: 11/06/21Spiritual/Religious Support Provided: Spiritual Support, Family SupportFollow-Up Visit Needed: NoOutcome:     OUTCOMES: Progressed Spiritually: Received desired ritual or practice, Established chaplain relationship       Plan: Will refer to Unit Chaplain for on going support.     Total Consult Time: 14 minutes	Signed: Sr. Berna Bue, O.P., Friday: Emergency Department 7:30 am - 6:00 pmSaturday and Sunday: Consults 10:00 am - 5:30 pmMobile Heartbeat [phone]. 11/6/20212:43 PM

## 2020-07-02 NOTE — Plan of Care
Problem: Adult Inpatient Plan of CareGoal: Plan of Care ReviewOutcome: Interventions implemented as appropriate Plan of Care Overview/ Patient Status    Neuro: A&Ox3. Waxes and wanes in the morning. Was extremely confused, provider aware, called to bedside, needing reorientation. Family updated and at bedside this afternoon.Cardio: VSS. No c/o CP.Resp: Weaned to RA this shift. Sat-ing 92% throughout the day. Audible wheezing on expiration. No c/o SOB but signs of DOE. Lung sounds diminished.GI/GU: Pt didn't get out of bed due to confusion and dizziness incident last night. Typically Ax1 w/ RW to the bathroom. Continent of bowel. One BM this shift. Foley inserted, sterile technique used. Output of 600 this shift. Skin: Scabs on elbows. Cream applied, otherwise C/D/I.Pain: Constant pain. Meds given per MAR.IV: 20G in wrist flushed and patent. Dressing changed this shift.Safety: Call bell w/in reach. Bed alarm on. See flowsheet for further detail.

## 2020-07-03 ENCOUNTER — Inpatient Hospital Stay: Admit: 2020-07-03 | Payer: PRIVATE HEALTH INSURANCE

## 2020-07-03 LAB — BLOOD GAS, ARTERIAL (BH YH)
BKR BASE EXCESS ARTERIAL: 2 mmol/L — ABNORMAL LOW (ref ?–2)
BKR HCO3, ARTERIAL: 25.7 mmol/L (ref 21.0–28.0)
BKR O2 SATURATION, ARTERIAL: 89 % — ABNORMAL LOW (ref 94–98)
BKR PATIENT TEMP: 35.7 Celsius (ref 136–144)
BKR PCO2 ARTERIAL: 35 mmHg (ref 32–48)
BKR PH, ARTERIAL: 7.48 units — ABNORMAL HIGH (ref 7.35–7.45)

## 2020-07-03 LAB — CBC WITH AUTO DIFFERENTIAL
BKR PO2, ARTERIAL: 90.2 % — ABNORMAL HIGH (ref 37.0–84.0)
BKR WAM ABSOLUTE IMMATURE GRANULOCYTES: 1.5 x 1000/??L — ABNORMAL HIGH (ref 0.0–0.3)
BKR WAM ABSOLUTE LYMPHOCYTE COUNT: 1.3 x 1000/??L (ref 1.0–4.0)
BKR WAM ABSOLUTE NRBC: 0 x 1000/??L (ref 0.0–0.0)
BKR WAM BASOPHIL ABSOLUTE COUNT: 0.1 x 1000/??L — ABNORMAL HIGH (ref 0.0–0.0)
BKR WAM BASOPHILS: 0.2 % (ref 0.0–4.0)
BKR WAM EOSINOPHIL ABSOLUTE COUNT: 0 x 1000/??L (ref 0.0–1.0)
BKR WAM EOSINOPHILS: 0 % (ref 0.0–7.0)
BKR WAM HEMATOCRIT: 33.3 % — ABNORMAL LOW (ref 37.0–52.0)
BKR WAM HEMOGLOBIN: 10.8 g/dL — ABNORMAL LOW (ref 12.0–18.0)
BKR WAM IMMATURE GRANULOCYTES: 4.2 % — ABNORMAL HIGH (ref 0.0–3.0)
BKR WAM LYMPHOCYTES: 3.5 % — ABNORMAL LOW (ref 8.0–49.0)
BKR WAM MCH (PG): 30.5 pg (ref 27.0–31.0)
BKR WAM MCHC: 32.4 g/dL (ref 31.0–36.0)
BKR WAM MCV: 94.1 fL — ABNORMAL HIGH (ref 78.0–94.0)
BKR WAM MONOCYTE ABSOLUTE COUNT: 0.7 x 1000/ÂµL (ref 0.0–2.0)
BKR WAM MONOCYTES: 1.9 % — ABNORMAL LOW (ref 4.0–15.0)
BKR WAM MPV: 9.9 fL (ref 6.0–11.0)
BKR WAM NEUTROPHILS: 90.2 % — ABNORMAL HIGH (ref 37.0–84.0)
BKR WAM NUCLEATED RED BLOOD CELLS: 0 % (ref 0.0–1.0)
BKR WAM PLATELETS: 219 x1000/??L (ref 140–440)
BKR WAM RDW-CV: 15.2 % — ABNORMAL HIGH (ref 11.5–14.5)
BKR WAM RED BLOOD CELL COUNT: 3.5 M/??L — ABNORMAL LOW (ref 3.8–5.9)
BKR WAM WHITE BLOOD CELL COUNT: 36.5 x1000/??L — ABNORMAL HIGH (ref 4.0–10.0)

## 2020-07-03 LAB — BASIC METABOLIC PANEL
BKR ANION GAP: 13 (ref 7–17)
BKR BLOOD UREA NITROGEN: 23 mg/dL (ref 8–23)
BKR BUN / CREAT RATIO: 16.5 (ref 8.0–23.0)
BKR CALCIUM: 8.9 mg/dL (ref 8.8–10.2)
BKR CHLORIDE: 104 mmol/L (ref 98–107)
BKR CO2: 26 mmol/L (ref 20–30)
BKR CREATININE: 1.39 mg/dL — ABNORMAL HIGH (ref 0.40–1.30)
BKR EGFR (AFR AMER): >60 mL/min/{1.73_m2} (ref >60)
BKR EGFR (NON AFRICAN AMERICAN): 51 mL/min/{1.73_m2} (ref >60)
BKR GLUCOSE: 113 mg/dL — ABNORMAL HIGH (ref 70–100)
BKR POTASSIUM: 3.7 mmol/L (ref 3.3–5.3)
BKR SODIUM: 143 mmol/L (ref 136–144)
BKR WAM ANALYZER ANC: 51 mL/min/1.73m2 — ABNORMAL HIGH (ref >60)

## 2020-07-03 LAB — TROPONIN T     (Q)
BKR TROPONIN T: 0.02 ng/mL — ABNORMAL HIGH (ref <0.01)
BKR WAM BASOPHIL ABSOLUTE COUNT: 0.02 ng/mL — ABNORMAL HIGH (ref <0.01)

## 2020-07-03 MED ORDER — LORAZEPAM 2 MG/ML INJECTION SOLUTION
2 mg/mL | Freq: Once | INTRAVENOUS | Status: CP
Start: 2020-07-03 — End: ?
  Administered 2020-07-04: 05:00:00 2 mL via INTRAVENOUS

## 2020-07-03 NOTE — Progress Notes
[facility] Progress NoteAttending Provider: Simeon Craft, MD [phone]                                            Subjective: Interim History: Patient denies any CP, SOB or abdominal pain.Review of Allergies/Meds/Hx: Review of Allergies/Meds/Hx:I have reviewed the patient's: current scheduled medications, current infusions, current prn medications and past medical historyObjective: Vitals:I have reviewed the patient's current vital signs as documented in the patient's EMR.   and Last 24 hours: Temp:  [97.3 ?F (36.3 ?C)-98.9 ?F (37.2 ?C)] 97.3 ?F (36.3 ?C)Pulse:  [39-89] 89Resp:  [17-24] 24BP: (102-146)/(61-84) 102/66SpO2:  [90 %-98 %] 90 %I/O's:I have reviewed the patient's current I&O's as documented in the EMR.Gross Totals (Last 24 hours) at 07/03/2020 0832Last data filed at 07/03/2020 0300Intake -- Output 2250 ml Net -2250 ml Procedures:None Physical Exam:Physical ExamConstitutional:     Comments: Laying in bed in NAD HENT:    Head: Normocephalic. Eyes:    Comments: Opens his eyes on command but then closes them Cardiovascular:    Rate and Rhythm: Normal rate and regular rhythm. Pulmonary:    Effort: Pulmonary effort is normal.    Breath sounds: Normal breath sounds. Abdominal:    General: Bowel sounds are normal.    Palpations: Abdomen is soft. Musculoskeletal:    Right lower leg: No edema.    Left lower leg: No edema. Neurological:    Comments: Oriented to self, place, month/year Labs:I have reviewed the patient's labs within the last 24 hrs.Na 143 K 3.7Cr 1.39 Glucose 113WBC 36.5H&H 10.8/33.3Platelets 219Diagnostics:I have reviewed the patient's Radiology report(s) within the last 48 hrs. Significant findings are   .XR Chest PA or AP (Portable) [[id number]] Collected: 07/02/20 1227 Increasing interstitial opacities compared to study from 4 days prior which may represent increasing pulmonary edema. Innumerable scattered bilateral pulmonary nodules which are better characterized on the prior chest [place]. 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 Course c/b AMS.Plan: Acute hypoxic respiratory failure - No stable on room air- s/p 40 mg IV Lasix once yesterday - Continue standing nebs- Monitor I/O and daily weightsMyocarditis- Continue Cellcept 1g BID- Continue prednisone 100 mg daily (day 5/7) - taper by 10 mg/week- Continue metop 25 mg daily for PVC burdenAMS- Waxing and waning MS - AAOx3 this AM, but confused at times with periods of agitation overnights- More aware in afternoon with family yesterday per RN - Likely related to hospitalization- Reorient frequently- Will monitor AKI on CKD- Appreciate urology consult- Resolved- Foley in place- Repeat renal US (ordered) to eval hydroLeukocytosis- Likely iso steroid use- No signs of infection at this timeCAD- Continue ASA and Plavix- Continue statinUrothelial ca- Continue Flomax 0.4 mg QHS - Continue oxybutynin 5 mg daily - Continue standing Oxycontin - Continue PRN oxycodoneOSA- Continue CPAPL traumatic iritis- Appreciate ophtho consult- Continue pred gtt day #3/5- Continue Refresh gttCode Status: Full code/ACLSMed Rec: completeDiet: CardiacPPx: SQHDispo: pending improvement Electronically Signed:Lindsey Romano, PA-CContact via MHB11/02/2020 8:06 AM.ATTENDING ADDENDUM:Patient seen and examined independently by me. I have reviewed the above stated history, physical examination, laboratory/diagnostic data in detail.  Care plan discussed with the APP, I agree with the plan of care as outlined above.

## 2020-07-03 NOTE — Plan of Care
[address] [facility]		Spiritual Care NotePurpose: Referral Source: Patient Paul Hunter was on the [employer] and SOS) Observation: People present/Information Obtained From: Patient, Other Family Member Emotional Mood: Calm, Sleepy Quality of Relational Support: Familiy In-Town and Involved Types of Relational Support: Family   Subjective: Paul Hunter was in bed and visited by his sister in law who is also his healthcare repHe appeared calm, lethargic and was intermittently engaged in conversation before falling back to sleep His sister in law seemed supportive, tearful,  concernedShe shared that Paul Hunter has a poor heart and that his bladder cancer has metastasized Holy Communion and the anointing of the sick were requested and provided at the bedsideSpiritual Assessment:Referral Source: Patient Paul Hunter was on the [employer] and SOS) Information Obtained From: Patient, Other Family Member Mood: Calm, SleepyRelational SupportTypes of Relational Support: Science writer of Relational Support: Familiy In-Town and Involved Religious Affiliation: Manufacturing systems engineer  Spiritual Resources: Faith, Family, God - Relationship, PrayerHelpful Religious Practices: Shared Prayer    Issues RaisedRaised by Patient: No Issues RaisedRaised by Family: Anticipatory Grief, Concern about Patient's Outcome, Progression of Illness, Disruption of Family Life   FICA:   and NA    and NASpiritual Interventions:Spiritual Intervention Index**Date of Spiritual Visit: 11/07/21Intervention Type: Ritual/Sacrament, Spiritual VisitResponding Chaplain: Hospital PriestType of Ritual/Sacrament: Communion, Sacrament of the SickDate of Communion Given: 11/07/21Date of Sacrament of the Sick Given: 11/07/21Religious Needs: PrayerSpiritual/Religious Support Provided: Companionship, Prayer, Spiritual Support, Family Support, Introduction to Chaplaincy ServicesOutcome: OUTCOMES: Expressed Spiritual/Religious Resources or Distress: Utilized spiritual resources/practices OUTCOMES: Expressed Emotional Resources or Distress: Emotions Expressed/Distress Reduced, Relational resources identified, Relational resources utilized, Expressed increased desire to heal OUTCOMES: Progressed Spiritually: Established chaplain relationship, Knowledgeable about services of the Dept. of Spiritual Care, Received desired ritual or practice OUTCOMES: Progressed Emotionally or Physically: Increased satisfaction, Increased comfort     Plan: Unit chaplain can follow up as needed.    Total Consult Time: 24 minutes	Signed: Marchelle Gearing, [place] - [phone], Wed thru Sun 8am -  4:30pm, or page the [facility] at [phone] 11/7/202110:58 AMPlan of Care Overview/ Patient Status

## 2020-07-03 NOTE — Plan of Care
Patient seen with daughter at the bedside. Updated on POC. Upset regarding patient's MS. Reports it is hard to see patient like this.Neuro exam performed per Dr. Ferne Reus, with cardio-onc. On Exam:- Oriented to self, place, year- PERRL, EOMi- Facial movements symmetrical - puffs out cheeks, smiles, raises eyebrows- No deviation of the tongue- Strength 5/5 BUE and BLE- Sensation intact, although took a lot of prompting for patient to be able to tell provider which side he felt sensation- Able to perform rapid alternating hand movements- Shakiness while performing finger to nose - could follow directions, but missed providers fingers frequently, although this was bilateralABG done. No acidosis noted. O2 sat 89% on 2L. Will ask RN to monitor O2 sat Peri Jefferson, PA-C11/02/2020 4:39 PM

## 2020-07-03 NOTE — Plan of Care
Plan of Care Overview/ Patient Status    BP (!) 142/84  - Pulse (!) 39  - Temp 98.9 ?F (37.2 ?C) (Oral)  - Resp 18  - Wt 128.5 kg  - SpO2 (!) 92%  - BMI 38.42 kg/m? A+OX3. Requires frequent reorientation. Increasing confusion as the night progressed. Ativan given for anxiety with good effect. Pain well managed with oxycontin and oxycodone as ordered. All medications given per [facility]. Foley remains patent draining large amounts of CYU, Foley care completed. T/r q2h. Hourly rounding. All safety measures maintained. Questions encouraged. Call bell within reach. Bed in lowest position. Maryln Manuel, RN

## 2020-07-03 NOTE — Plan of Care
 Problem: Adult Inpatient Plan of CareGoal: Plan of Care ReviewOutcome: Interventions implemented as appropriate Plan of Care Overview/ Patient Status    Neuro: A&Ox3. Waxes and wanes in the morning, needs frequent reorientation. ABG done this shift by provider. Cardio: VSS. No c/o CP. Ted stockings on pt this afternoon. Resp: Sat-ing 92% throughout the day. Audible wheezing on expiration. Put on 2L O2 after trying to sit up and getting SOB. DOE present. Lung sounds diminished. GI/GU: Pt didn't get out of bed due to confusion. Typically Ax1 w/ RW to the bathroom. Continent/incontinent of bowel. One BM this shift. Output of 500ccs this shift. Regular diet but pt having poor PO intake, provider aware. Skin: Scabs on elbows. Cream applied, otherwise C/D/I. Pain: Constant pain. Meds given per MAR. IV: 20G in wrist flushed and patent. R chest port accessed this shift w/ 1inch 20G needle. Dressing C/D/I. Flushed and patent w/ +blood return. Safety: Call bell w/in reach. Bed alarm on. See flowsheet for further detail.**[id number]: Spoke w/ pts sister who stated that pt is supposed to be DNR not full code. Provider notified.

## 2020-07-04 ENCOUNTER — Inpatient Hospital Stay: Admit: 2020-07-04 | Payer: PRIVATE HEALTH INSURANCE

## 2020-07-04 ENCOUNTER — Inpatient Hospital Stay: Admit: 2020-07-04 | Payer: PRIVATE HEALTH INSURANCE | Attending: Certified Registered"

## 2020-07-04 ENCOUNTER — Encounter: Admit: 2020-07-04 | Payer: PRIVATE HEALTH INSURANCE | Attending: Vascular and Interventional Radiology

## 2020-07-04 DIAGNOSIS — N179 Acute kidney failure, unspecified: Secondary | ICD-10-CM

## 2020-07-04 LAB — BASIC METABOLIC PANEL
BKR ANION GAP: 13 (ref 7–17)
BKR ANION GAP: 13 (ref 7–17)
BKR BLOOD UREA NITROGEN: 25 mg/dL — ABNORMAL HIGH (ref 8–23)
BKR BLOOD UREA NITROGEN: 26 mg/dL — ABNORMAL HIGH (ref 8–23)
BKR BUN / CREAT RATIO: 18.7 pg (ref 8.0–23.0)
BKR BUN / CREAT RATIO: 19.5 mmol/L (ref 8.0–23.0)
BKR CALCIUM: 8.8 mg/dL (ref 8.8–10.2)
BKR CALCIUM: 8.6 mg/dL — ABNORMAL LOW (ref 8.8–10.2)
BKR CHLORIDE: 104 mmol/L (ref 98–107)
BKR CHLORIDE: 105 mmol/L (ref 98–107)
BKR CO2: 25 mmol/L (ref 20–30)
BKR CO2: 25 mmol/L (ref 20–30)
BKR CREATININE: 1.34 mg/dL — ABNORMAL HIGH (ref 0.40–1.30)
BKR CREATININE: 1.33 mg/dL — ABNORMAL HIGH (ref 0.40–1.30)
BKR EGFR (AFR AMER): >60 mL/min/{1.73_m2} (ref >60)
BKR EGFR (AFR AMER): >60 mL/min/{1.73_m2} (ref >60)
BKR EGFR (NON AFRICAN AMERICAN): 53 mL/min/{1.73_m2} (ref >60)
BKR EGFR (NON AFRICAN AMERICAN): 54 mL/min/{1.73_m2} (ref >60)
BKR GLUCOSE: 123 mg/dL — ABNORMAL HIGH (ref 70–100)
BKR GLUCOSE: 98 mg/dL (ref 70–100)
BKR POTASSIUM: 4.2 mmol/L (ref 3.3–5.3)
BKR POTASSIUM: 4.4 mmol/L (ref 3.3–5.3)
BKR SODIUM: 142 mmol/L (ref 136–144)
BKR SODIUM: 143 mmol/L — ABNORMAL HIGH (ref 136–144)
BKR WAM PLATELETS: 25 mmol/L (ref 20–30)

## 2020-07-04 LAB — CBC WITHOUT DIFFERENTIAL
BKR WAM ANALYZER ANC: 31.7 x 1000/ÂµL — ABNORMAL HIGH (ref 1.0–11.0)
BKR WAM HEMATOCRIT: 30.3 % — ABNORMAL LOW (ref 37.0–52.0)
BKR WAM HEMOGLOBIN: 9.9 g/dL — ABNORMAL LOW (ref 12.0–18.0)
BKR WAM MCH (PG): 31.2 pg — ABNORMAL HIGH (ref 27.0–31.0)
BKR WAM MCHC: 32.7 g/dL (ref 31.0–36.0)
BKR WAM MCV: 95.6 fL — ABNORMAL HIGH (ref 78.0–94.0)
BKR WAM MPV: 10.3 fL (ref 6.0–11.0)
BKR WAM RDW-CV: 15.2 % — ABNORMAL HIGH (ref 11.5–14.5)
BKR WAM RED BLOOD CELL COUNT: 3.2 M/??L — ABNORMAL LOW (ref 3.8–5.9)
BKR WAM WHITE BLOOD CELL COUNT: 34.7 x1000/??L — ABNORMAL HIGH (ref 4.0–10.0)

## 2020-07-04 LAB — MAGNESIUM: BKR MAGNESIUM: 2.3 mg/dL (ref 1.7–2.4)

## 2020-07-04 LAB — BLOOD GAS, ARTERIAL (BH YH)
BKR BASE EXCESS ARTERIAL: 3 mmol/L (ref ?–2)
BKR HCO3, ARTERIAL: 25.5 mmol/L (ref 21.0–28.0)
BKR O2 SATURATION, ARTERIAL: 96 % (ref 94–98)
BKR PATIENT TEMP: 37.2 Celsius
BKR PCO2 ARTERIAL: 32 mmHg (ref 32–48)
BKR PH, ARTERIAL: 7.52 units — ABNORMAL HIGH (ref 7.35–7.45)
BKR PO2, ARTERIAL: 76 mmHg — ABNORMAL LOW (ref 83–108)

## 2020-07-04 LAB — NT-PROBNPE: BKR B-TYPE NATRIURETIC PEPTIDE, PRO (PROBNP): 2469 pg/mL — ABNORMAL HIGH (ref <125.0)

## 2020-07-04 LAB — BLOOD CULTURE
BKR BLOOD CULTURE: NO GROWTH
BKR BLOOD CULTURE: NO GROWTH

## 2020-07-04 LAB — TROPONIN T     (Q): BKR TROPONIN T: 0.02 ng/mL — ABNORMAL HIGH (ref <0.01)

## 2020-07-04 LAB — COVID-19 CLEARANCE OR FOR PLACEMENT ONLY: BKR SARS-COV-2 RNA (COVID-19) (YH): NEGATIVE g/dL — ABNORMAL LOW (ref 7–17)

## 2020-07-04 LAB — PHOSPHORUS     (BH GH L LMW YH): BKR PHOSPHORUS: 3.2 mg/dL (ref 2.2–4.5)

## 2020-07-04 MED ORDER — LORAZEPAM 2 MG/ML INJECTION SOLUTION
2 mg/mL | Freq: Once | INTRAVENOUS | Status: CP
Start: 2020-07-04 — End: ?
  Administered 2020-07-04: 08:00:00 2 mL via INTRAVENOUS

## 2020-07-04 MED ORDER — CHLORHEXIDINE GLUCONATE 4 % TOPICAL LIQUID
4 % | Freq: Every day | TOPICAL | Status: DC
Start: 2020-07-04 — End: 2020-07-07
  Administered 2020-07-04 – 2020-07-05 (×2): 4 mL via TOPICAL

## 2020-07-04 MED ORDER — FUROSEMIDE 10 MG/ML INJECTION SOLUTION
10 mg/mL | Freq: Once | INTRAVENOUS | Status: CP
Start: 2020-07-04 — End: ?
  Administered 2020-07-04: 15:00:00 10 mL via INTRAVENOUS

## 2020-07-04 MED ORDER — SODIUM CHLORIDE 0.9 % (FLUSH) INJECTION SYRINGE
0.9 % | Status: DC | PRN
Start: 2020-07-04 — End: 2020-07-07

## 2020-07-04 MED ORDER — HEPARIN, PORCINE (PF) 100 UNIT/ML IN 0.9% SODIUM CHLORIDE IV SYRINGE
100 unit/mL | Status: DC | PRN
Start: 2020-07-04 — End: 2020-07-07

## 2020-07-04 MED ORDER — OXYCODONE IMMEDIATE RELEASE 5 MG TABLET
5 mg | ORAL | Status: DC | PRN
Start: 2020-07-04 — End: 2020-07-07
  Administered 2020-07-05 (×2): 5 mg via ORAL

## 2020-07-04 NOTE — Progress Notes
[facility] Medicine Progress NoteAttending Provider: Coy Saunas, MD [phone]                                            Subjective: Interim History: Called by RN early this AM as respiratory had placed patient on 6L NC.Patient seen with Dr. Burnell Blanks and RN at the bedside. Patient more lethargic/altered today. Ordered for 40 mg IV Lasix and Chest XR. ABG done without acidosis. Review of Allergies/Meds/Hx: Review of Allergies/Meds/Hx:I have reviewed the patient's: current scheduled medications, current infusions, current prn medications and past medical historyObjective: Vitals:I have reviewed the patient's current vital signs as documented in the patient's EMR.   and Last 24 hours: Temp:  [97.6 ?F (36.4 ?C)-98.9 ?F (37.2 ?C)] 98 ?F (36.7 ?C)Pulse:  [41-108] 47Resp:  [17-30] 24BP: (107-159)/(58-81) 122/65SpO2:  [90 %-95 %] 92 %I/O's:I have reviewed the patient's current I&O's as documented in the EMR.Gross Totals (Last 24 hours) at 07/04/2020 1328Last data filed at 07/04/2020 0600Intake 190 ml Output 800 ml Net -610 ml Procedures:None Physical Exam:Physical ExamConstitutional:     Comments: Male sitting up in bed HENT:    Head: Normocephalic. Eyes:    Comments: Sleeping during most of the exam, but opens eyes on exam Cardiovascular:    Rate and Rhythm: Normal rate and regular rhythm. Pulmonary:    Breath sounds: Wheezing (Diffusely ) present.    Comments: Increased work of breathingAbdominal:    Palpations: Abdomen is soft.    Comments: Central adiposity Musculoskeletal:    Comments: Slightly pitting edema ankles bilaterally Neurological:    Comments: Oriented to self, place, year. Follows commands with much prompting Labs:I have reviewed the patient's labs within the last 24 hrs.Na 143 K 4.4Cr 1.33 Glucose 98WBC 34.7H&H 9.9/30.3Platelets 240ABG, 7.52/32/96/25.5Diagnostics:I have reviewed the patient's Radiology report(s) within the last 48 hrs. Significant findings are   . XR Chest PA or AP (Portable) [[id number]] Collected: 07/04/20 1058 Unchanged bilateral pulmonary nodules. Hazy and reticular opacities slightly increased, possibly pulmonary edema. US Renal [[id number]] Collected: 07/04/20 0848 Limited examination as described above. Stable mild left hydronephrosis. [place] wo IV Contrast [[id number]] Collected: 07/03/20 1900 Pontine hypoattenuation without evidence of hemorrhage. No acute intracranial abnormality seen. 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 Course c/b AMS.Plan: Acute hypoxic respiratory failure - Worsening today- Exam and CXR c/f pulmonary edema- s/p 40 mg IV Lasix this AM with good response. Consider redosing this afternoon- Now down to 4L NC. Continue to wean for O2 sats >/=88%- Continue standing nebs- Monitor I/O and daily weightsMyocarditis- Continue Cellcept 1g BID- Continue prednisone 100 mg daily (day 6/7) - taper by 10 mg/week- Continue metop 25 mg daily for PVC burdenAMS- Waxing and waning MS - continues to be AAOx3, but lethargic (although easily arousable) and restless- Neuro exam yesterday unremarkable- [place] with no acute findings to explain AMS. Appreciate neuro review of pontine hypo attenuation- At this point would obtain MRI brain given c/f brain mets - POA, [name], agrees with plan and need for anesthesia - Reorient frequently- Will monitor AKI on CKD- Appreciate urology  consult- Resolved- Foley in place- Repeat renal US with stable hydroLeukocytosis- Likely iso steroid use- No signs of infection at this timeCAD- Continue ASA and Plavix- Continue statinUrothelial ca- Continue Flomax 0.4 mg QHS - Continue oxybutynin 5 mg daily - Continue standing Oxycontin - Continue PRN oxycodoneOSA- Continue CPAPL traumatic iritis- Appreciate ophtho consult- Continue pred gtt day #4/5- Continue Refresh gttCode Status: DNR/DNI - see advance care planning note from today Med Rec: completeDiet: CardiacPPx: SQHDispo: pending improvement in MS Electronically Signed:Lindsey Romano, PA-CContact via MHB11/03/2020 1:28 PM---------------Attending addendumI have independently examined and evaluated the patient and I agree with my PA Mardella Layman Romano's findings assessment and plan.  Evaluated him when he was tachypneic and hypoxic to the 80s requiring 6 liters of oxygen via nasal cannula.  He was confused and delirious.  Exam with end-expiratory wheezing all over with bilateral leg swelling.  His work of breathing as well as oxygen requirement improved with IV Lasix however noted that the blood pressure is soft.  Agree with re-dosing this evening.  Appreciative of palliative care input.Once we have the brainstem lesion evaluation with an MRI,  further goals of care discussion can be had with the POA.Coy Saunas, MD

## 2020-07-04 NOTE — Plan of Care
Plan of Care Overview/ Patient Status    1900-0700A/Ox2-3. Disoriented to date & situation. Sundowning? Very restless & tremulous overnight. Also lethargic, sleeping between care yet arouses to voice. Increased work of breathing upon inspection. DOE+& tachypnic @ times. Lungs sounds clear, diminished. Pulling at tele wires & foley, also removing CPAP & gown. Reorientation provided. Tele maintained, NSVT & PVCs noted w/ tachycardia . MD notified of new NSVT alert. EKG completed. Labs sent. MD came to bed side to assess pt. IV ativan given x2 w/ min effect. Other VSS on 2 L nc & CPAP at night. Pt often noncompliant w/ CPAP 2/2 confusion. Foley in place, voiding spontaneously. Cath care provided. No BM this shift. PRN oxy given for chronic abd pain w/ + effect. Bruising & scabs present on skin. Mild BLE edema, compression stockings in place. Pt did not eat dinner despite encouragement. R chest port, CDI/+BR Specialty bed in place. T/P ind in bed & w/ assistance. Ax1. Taking pills whole. Call bell in reach. Fall precautions in place. Safety checks completed.

## 2020-07-04 NOTE — Plan of Care
 [place] [facility]		Spiritual Care NotePurpose: Referral Source: Chaplain Initiated Observation: People present/Information Obtained From: Patient Emotional Mood: Sleepy Quality of Relational Support: Familiy In-Town and Involved Types of Relational Support: Family, Sibling   Subjective: The patient had tough time opening his eyes at the time of the visit. He seems to be in a lot of pain. The chaplain offered prayer and blessing.Spiritual Assessment:Referral Source: Chaplain Initiated Information Obtained From: Patient Mood: Engineer, technical sales of Relational Support: Family, Chiropractor of Relational Support: Familiy In-Town and Involved Religious Affiliation: Roman Financial planner Resources: Clergy Visits, Faith, Family, Friends, God - Relationship, Prayer         FICA:   and NA  NASpiritual Interventions:Spiritual Intervention Index**Date of Spiritual Visit: 11/08/21Visit Type: New Patient, Initial VisitHow Well Do You Speak English?: very wellIntervention Type: Spiritual VisitResponding Chaplain: Hospital PriestType of Ritual/Sacrament: BlessingDate of Blessing Given: 11/08/21Religious Needs: PrayerSpiritual/Religious Support Provided: Companionship, Prayer, Spiritual SupportOutcome:             Plan: Continued prayerful support   Total Consult Time: 10 minutes	Signed:   Fr.  AnaetoHospital [phone]  11/8/20213:49 PM

## 2020-07-04 NOTE — ACP (Advance Care Planning)
Alerted by RN this AM that patient's sister/POA, Paul Hunter, called last night and reported that patient should be DNR. Copy of patient's Advance Directive found in chart. Updated as of May 18, 2020. This documentation lists sister, Paul Hunter, as his healthcare representative. Documentation also states that if patient has a terminal condition, he would NOT want CPR or artificial respiration. Discussed this with pall care APRN, Josephine Igo, who felt it was appropriate to change patient's code status based on conversation with Paul Hunter and Advanced Directive given patient's terminal diagnosis of metastatic cancer.Spoke with Paul Hunter over the phone. Paul Hunter updated on POC. Understands patient still remains confused. Agrees with plan for MRI brain with anesthesia.Paul Hunter reports she had conversations with patient PTA when he was not confused, and he would not want aggressive measures in this instance where his cancer is progressing. Paul Hunter reports she has discussed this with patient's family and they all agree with DNR/DNI.Code status changed to DNR/DNI. Peri Jefferson, PA-C11/03/2020 1:25 PM

## 2020-07-04 NOTE — Plan of Care
Inpatient Physical Therapy Evaluation     IP Adult PT Eval/Treat - 07/04/20 0955          Date of Visit / Treatment    Date of Visit / Treatment 07/04/20     Note Type Evaluation     Progress Report Due 07/18/20     End Time 0955        General Information    Pertinent History Of Current Problem 69 y.o. 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 Found to have worsening metastatic lung disease, pulmonary edema and possible PNA. Overall improving but had episode of confusion this morning.     Subjective Mumbling throughout session     General Observations RN cleared; on 2L O2 via NC; foley/catheter; tele; NAD     Precautions/Limitations fall precautions;bed alarm;chair alarm     Precautions/Limitations Comment Confused, fall risk     Fall History yes, reason for admission        Weight Bearing Status    Weight Bearing Status WNL - Within normal limits        Prior Level of Functioning/Social History    Prior Level of Function independent with assistive device;assist with ADLs     Additional Comments Per pt and chart, lives alone in an in law apt attached to his nieces home. + 24 hr support from niece and other family members. Primarily uses a SC for ambulation at baseline. + 1 FOS to apt with rail. (I) with ADLs, mobility, and IADLs at baseline. Has been in the hospital 4 times in the past 4 weeks        Vital Signs and Orthostatic Vital Signs    Vital Signs Free text 2L O2; HR fluctuating between 80- 122 resting and during bed level eval. O2 88-91% as well. RN notified     Pre Treatment BP 115/61        Pain/Comfort    Pain Comment (Pre/Post Treatment Pain) No obsereved s/s pain        Patient Coping    Observed Emotional State agitated;restless        Cognition    Overall Cognitive Status Impaired     Orientation Level UTA     Level of Consciousness confused     Following Commands Inconsistently able to follow one step commands     Personal Safety / Judgment Fall risk;Requires supervision for safety cognitively;Decreased awareness of need for assistance;Decreased awareness of need for safety        Vision/ Hearing    Vision Assessment Results Glasses for reading        Range of Motion    Range of Motion Comments [facility]        Manual Muscle Testing    Manual Muscle Testing Comments Deconditioned.        Musculoskeletal    LUE Muscle Strength Grading 3-->active movement against gravity     RUE Muscle Strength Grading 3-->active movement against gravity     LLE Muscle Strength Grading 3-->active movement against gravity     RLE Muscle Strength Grading 3-->active movement against gravity        Muscle Tone    Muscle Tone Testing Results No muscle tone deficits noted  Neurologic    Neurologic Assessment Comments UTA formally 2/2 cognition        Coordination    Fine Motor Coordination UTA 2/2 cognition        Peripheral Neurovascular    Peripheral Neurovascular Comments edema B LE        Sensory Assessment    Sensory Tests Results Chronic neuropathy        Skin Assessment    Skin Assessment See Nursing Documentation        Balance    Balance Skills Training Comment deferred 2/2 cognition and limited ability to follow commands        Bed Mobility    Rolling/Turning Right - Independence/Assistance Level Maximum assist     Rolling/Turning Left - Independence/Assistance Level Maximum assist     Supine-to-Sit Independence/Assistance Level Unable to assess     Bed Mobility Comments Repositioned in bed with Ax2        Sit-Stand Transfer Training    Sit-to-Stand Transfer Independence/Assistance Level Unable to perform     Sit-Stand Transfer Comments Deferred 2/2 cognition and VS        Gait Training    Gait Training Comments Deferred 2/2 cognition and VS        Handoff Documentation    Handoff Patient in bed;Bed alarm;Patient instructed to call nursing for mobility;Discussed with nursing     Handoff Comments RN and RT present        Endurance    Endurance Comments P        PT- AM-PAC - Basic Mobility Screen- How much help from another person do you currently need.....    Turning from your back to your side while in a a flat bed without using rails? 1 - Total - Requires total assistance or cannot do it at all.     Moving from lying on your back to sitting on the side of a flat bed without using bed rails? 1 - Total - Requires total assistance or cannot do it at all.     Moving to and from a bed to a chair (including a wheelchair)? 1 - Total - Requires total assistance or cannot do it at all.     Standing up from a chair using your arms(e.g., wheelchair or bedside chair)? 1 - Total - Requires total assistance or cannot do it at all.     To walk in a hospital room? 1 - Total - Requires total assistance or cannot do it at all.     Climbing 3-5 steps with a railing? 1 - Total - Requires total assistance or cannot do it at all.     AMPAC Mobility Score 6     TARGET Highest Level of Mobility Mobility Level 2, Turn self in bed/bed activity/dependent transfer         Therapeutic Functional Activity    Therapeutic Functional Activity Comments Bed mobility        Clinical Impression    Rehab Diagnosis Decreased functional mobility from baseline     Initial Assessment 07/04/20 Pt confused, A+Ox1 agitated and restless this day, RN aware. HR/O2 fluctuating on 2 L, RN notified and present toward end of session. He was inconsistently following 1 step commands. Bed level eval completed with further mobility deferred 2/2 cognition and fluctuating VS. Pt will benefit from skilled PT to improve mobility. Recommend STR     Prognosis good     Criteria for Skilled Therapeutic Interventions Met yes;treatment indicated  Impairments Found (describe specific impairments) aerobic capacity/endurance;arousal;attention;cognition;edema;gait;IADL's;mobility;muscle strength     Functional Limitations in Following Categories (Describe Specific Limitations) mobility;self-care     Rehab Potential fair, will monitor progress closely        Patient/Family Stated Goals    Patient/Family Stated Goal(s) unable to state        Frequency/Equipment Recommendations    PT Frequency 3x per week     What day of week is next treatment expected? Wednesday     Equipment Needs During Admission/Treatment Hospital bed        PT Recommendations for Inpatient Admission    ADL Recommendations assist of 2   bede level    Positioning reposition frequently;elevate lower extremities     Therapeutic Exercise ROM as tolerated        Planned Treatment / Interventions    Plan for Next Visit progress as able     Education Treatment / Interventions Patient Education / Training        PT Discharge Summary    Physical Therapy Disposition Recommendation STR (Short Term Rehab at a SNF)     Equipment Recommendations for Discharge To be determined pending progress                        Problem: Physical Therapy Goals  Goal: Physical Therapy Goals  Description: PT GOALS  1. Patient will perform bed mobility independently  2. Patient will perform transfers with least assistive device with modified independence  3. Patient will ambulate a minimum of 150 feet with least assistive device with modified independence  4. Patient will ascend and descend at least 5 steps with supervision     Outcome: Interventions implemented as appropriate     Aura Dials, PT

## 2020-07-04 NOTE — Progress Notes
07/04/20 0932 Date of Visit / Treatment Date of Visit / Treatment 07/04/20 Note Type Evaluation Progress Report Due 07/18/20 End Time 0932 General Information Pertinent History Of Current Problem 69 y.o. 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 Noted to have elevated leukocytosis, procal as well as trop and probnp. CXR suggestive of pulmonary edema, superimposed on a background of COPD and innumerable lung metastases and possible PNA. COVID negative. Acute on hypoxemic respiratory failure felt to be multifactorial d/t pulmonary edema, progression of pulmonary mets, and ?superimposed PNA. Subjective Mumbling throughout session General Observations RN cleared; on 2L O2 via NC; foley/catheter; tele; NAD Precautions/Limitations fall precautions;bed alarm Precautions/Limitations Comment Confused Fall History yes, reason for admission Weight Bearing Status Weight Bearing Status WNL - Within normal limits Prior Level of Functioning/Social History Prior Level of Function assist with ADLs;assist with mobility Additional Comments Per pt and chart, lives alone in an in law apt attached to his nieces home. + 24 hr support from niece and other family members. Primarily uses a [place] for ambulation at baseline. + 1 FOS to apt with rail. (I) with ADLs, mobility, and IADLs at baseline. Has been in the hospital 4 times in the past 4 weeks Vital Signs and Orthostatic Vital Signs Vital Signs Vital Signs Stable Vital Signs Free text HR between 90-122 BPM. O2 sat between 86-94% while on 2L O2, RN made aware Pre Treatment BP 115/61    Pre Treatment SpO2 (%) 89    Pre Treatment O2 Delivery nasal cannula    Pre Treatment O2 Concentration 2L During Treatment SpO2 (%) 88 During Treatment O2 Delivery nasal cannula    Post Treatment SpO2 (%) 90    Post Treatment O2 Delivery nasal cannula    Post Treatment O2 Concentration 2L Pain/Comfort Pain Comment (Pre/Post Treatment Pain) no indications of pain made Patient Coping Observed Emotional State agitated;restless Verbalized Emotional State acceptance Diversional Activities television Cognition Overall Cognitive Status Impaired Orientation Level UTA Level of Consciousness confused Following Commands Inconsistently able to follow one step commands Personal Safety / Judgment Fall risk;Requires supervision with mobility;Decreased recognition / insight of own deficits;Poor safety awareness Attention Difficulty dividing attention;Difficulty attending to directions;Attends with cues to redirect;Requires cues Problem Solving Simple impairment Vision/ Hearing Vision Assessment Results Glasses for reading Range of Motion Range of Motion Examination no ROM deficits were identified Manual Muscle Testing Manual Muscle Testing Comments Deconditioned' Musculoskeletal LUE Muscle Strength Grading 3-->active movement against gravity RUE Muscle Strength Grading 3-->active movement against gravity LLE Muscle Strength Grading 3-->active movement against gravity RLE Muscle Strength Grading 3-->active movement against gravity Musculoskeletal Comments UTA to formally test d/t poor cognition - scored through observation Muscle Tone Muscle Tone Testing Results No muscle tone deficits noted Coordination Fine Motor Coordination Impaired Coordination Comments Impaired Sensory Assessment Sensory Tests Results No sensory impairment noted Skin Assessment Skin Assessment See Nursing Documentation Posture, Head/Trunk Alignment Posture, Head/Trunk Alignment Comments UTA - bed-level session Balance Balance Skills Training Comment UTA 2/2 fluctuating vital signs and limited cognition BADL Retraining BADL Retraining Comment TD all ADLs  AM-PAC - Daily Activity IP Short Form Help needed from another person putting on/taking off regular lower body clothing 1 - Unable Help needed from another person for bathing (incl. washing,  rinsing, drying) 1 - Unable Help needed from another person for toileting (incl. using toilet, bedpan, urinal) 1 - Unable Help needed from another person putting on/taking off regular upper body clothing 1 - Unable Help needed from another person taking care of personal grooming such as brushing teeth 1 - Unable Help needed from another person eating meals 1 - Unable AM-PAC Daily Activity Raw Score (Total of rows above) 6 CMS Score (based on Raw Score - with G Code) 6 - 100% impaired     (G Code - CN) Bed Mobility Rolling/Turning Right - Independence/Assistance Level Maximum assist;Assist of 2 Rolling/Turning Left - Independence/Assistance Level Maximum assist;Assist of 2 Bed Mobility Comments Max(A)x2 repositioning Sit-Stand Transfer Training Sit-to-Stand Transfer Independence/Assistance Level Unable to perform Handoff Documentation Handoff Patient in bed;Bed alarm;Discussed with nursing Handoff Comments RT with pt upon conclusion of session. RN made aware of all vital signs. Therapeutic Exercise Therapeutic Exercise Comments Encouraged AROMx4, PROMx4 Therapeutic Functional Activity Therapeutic Functional Activity Comments Bed mob, pt edu Clinical Impression Initial Assessment 11/8 - Pt tol OT session poorly OTD. Pt with incr lethargy and difficulty following. Minimal command following at this time. D/t decr cognition and fluctuating vital signs, activity was limited to bed-level tasks. Tolerated PROMx4 fair at best. Noted to be restless throughout session. Minimal command following and sequencing of tasks observed at this time. Pt would continue to benefit from skilled OT services at this time in order to incr cognition, strength, bal, ROM, and act tol needed in order to perform ADLs and mob at baseline level. Rec d/c to STR. Criteria for Skilled Therapeutic Interventions Met yes;treatment indicated Impairments Found (describe specific impairments) aerobic capacity/endurance;BADL's;balance;gait;IADL's;muscle strength;ROM Functional Limitations in Following Categories (Describe Specific Limitations) self-care;mobility Rehab Potential fair, will monitor progress closely Patient/Family Stated Goals Patient/Family Stated Goal(s) unable to state Frequency/Equipment Recommendations OT Frequency 3x per week What day of week is next treatment expected? Wednesday(11/10) OT Recommendations for Inpatient Admission Activity/Level of Assist assist of 2;mechanical lift ADL Recommendations assist of 2(Bed-level ADLs) Planned Treatment / Interventions Plan for Next Visit prog as tol General Treatment / Interventions Adaptive Equipment Evaluation / Training;Activities of Daily Living;Aerobic Capacity / Endurance Conditioning;Discharge Planning;Home Exercise Program;Interdisciplinary Communication;Range of Motion;Posture Activities;Therapeutic Exercise Training Treatment / Interventions Balance / Gait Training;Endurance Media planner / Interventions Patient Education / Training OT Discharge Summary Disposition Recommendation STR (Short Term Rehab at a SNF) OT Goals:1) Pt will follow 1-step commands 50% of the time  in order to incr ease c ADLs and mob.   2) Pt will perf UB ADLs Max(A) in order to return to PLOF. 3) Pt will incr BUE strength to 3+/5 in order to incr ease c ADLs and mob.  Hilary Hertz, OTR/L

## 2020-07-04 NOTE — Progress Notes
Called by primary team regarding patient's CTH findings of pontine hypodensity on noncon CTH from 07/03/2020. Patient with documented neuro exam on 11/7 at 4:30pm intact with ongoing, fluctuating mental status abnormalities since admission. On review of imaging, questionable hypodensity appears to be similar to questionable hypodensity seen on prior CTH from 10/27. Team already planning for MRI brain as part of metastatic workup. Given lack of exam change that would localize to pons and apparent stability of hypodensities, no need for expedited workup but MRI brain w/ and w/o IV contrast for metastatic workup will also help characterize any possible pontine lesion.Geoffery Spruce, MDPGY-4 Neurology Resident

## 2020-07-04 NOTE — Plan of Care
Problem: Adult Inpatient Plan of CareGoal: Plan of Care ReviewOutcome: Interventions implemented as appropriate Plan of Care Overview/ Patient Status    Neuro: A&Ox3. Waxes and wanes in the morning, needs frequent reorientation. ABG done this shift by provider. Cardio: VSS. No c/o CP. Ted stockings removed this shift. Resp: Sat-ing 92% throughout the day. Audible wheezing on expiration. Put on 4L of O2 from respiratory, provider called to bedside. DOE present. Lasix given this shift. GI/GU: Ax2 standing up OOB. Continent/incontinent of bowel. One BM this shift. Output of 1400ccs this shift. Regular diet but pt having poor PO intake, provider aware. Skin: Scabs on elbows. Cream applied, otherwise C/D/I. Pain: Constant pain. Meds given per MAR. IV: 20G in wrist flushed and patent. R chest port, 20G needle. Dressing C/D/I. Flushed and patent w/ +blood return. Safety: Call bell w/in reach. Bed alarm on. See flowsheet for further detail.

## 2020-07-04 NOTE — Other
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 Palliative care was consulted to assist with symptom management.     Met with patient at bedside this morning.  He was not alert.  He was asleep and did not awaken to name call.  Per bedside nurse and chart review patient has been confused and agitated through the night.  Unable to obtain ROS due to medical condition.    Spoke with bedside RN Irving Burton, with hospitalist PA Lillia Abed today.  Irving Burton shared that patients sister and documented health care representative had called and shared that the code status in the computer was not inline with patients wishes.  Cathlean Sauer and this writer all viewed together his advanced directive.  Lillia Abed will call patients sister [name] to confirm code status.   ?  Palliative Care Review of Systems  Unable to assess due to medical condition    General Review of Systems  Unable to assess    Review of Allergies/Hx/Meds   Allergies  Patient has No Known Allergies.    Medical History  Past Medical History:   Diagnosis Date   ? Anxiety    ? CAD (coronary artery disease)    ? Diabetes mellitus (HC Code) (HC CODE)    ? Difficult intubation 05/22/2020    Patient with significant soft tissue in oropharynx. Glidescope intubation revealed anterior cords with floppy soft tissue obstruction. Patient will require video laryngoscopy for airway management going forward.   ? GERD (gastroesophageal reflux disease)    ? Hyperlipidemia    ? Hypertension    ? Hypertrophy of prostate without urinary obstruction and other lower urinary tract symptoms (LUTS)    ? Other testicular hypofunction    ? Spinal stenosis 09/28/2018   ? Tobacco abuse    ? Vitamin D deficiency        Family History  No family history on file.    Social History  Social History     Socioeconomic History   ? Marital status: Divorced     Spouse name: Not on file   ? Number of children: Not on file   ? Years of education: Not on file   ? Highest education level: Not on file   Occupational History   ? Not on file   Tobacco Use   ? Smoking status: Current Every Day Smoker     Types: Cigarettes   Substance and Sexual Activity   ? Alcohol use: Not on file   ? Drug use: Not on file   ? Sexual activity: Not on file   Other Topics Concern   ? Not on file   Social History Narrative    As of 2021: [place] living[place]in [place]?and moved back to [place] to be near family in 2021. He moved into his niece's in-law apartment in [place] while undergoing cancer treatment. ?He is currently self sufficient and independent.    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    -  [employer] coming for med management and HHA assistance    -family very involved    -he is divorced and has 1 daughter with a 45 month old who lives 30 minutes away and a son with a 38 month old in [place]        Smoking hx 49 PYH    -current 1 PPD    -meeting with smoking cessation    EtOH - not much         Social Determinants of Psychologist, prison and probation services Strain:    ? Difficulty of Paying Living Expenses:    Food Insecurity:    ? Worried About Programme researcher, broadcasting/film/video in the Last Year:    ? Barista in the Last Year:    Transportation Needs:    ? Freight forwarder (Medical):    ? Lack of Transportation (Non-Medical):    Physical Activity:    ? Days of Exercise per Week:    ? Minutes of Exercise per Session:    Stress:    ? Feeling of Stress :    Social Connections:    ? Frequency of Communication with Friends and Family:    ? Frequency of Social Gatherings with Friends and Family:    ? Attends Religious Services:    ? Active Member of Clubs or Organizations:    ? Attends Banker Meetings:    ? Marital Status:    Intimate Partner Violence:    ? Fear of Current or Ex-Partner:    ? Emotionally Abused:    ? Physically Abused:    ? Sexually Abused:        InPatient Medications  Current Facility-Administered Medications   Medication Dose Route Frequency Last Rate   ? acetaminophen  650 mg Oral Q6H PRN     ? aspirin  81 mg Oral Daily     ? carboxymethylcellulose  1 drop Both Eyes 4x Daily     ? carboxymethylcellulose  1 drop Both Eyes TID PRN     ? cetirizine  5 mg Oral Daily     ? chlorhexidine gluconate   Topical (Top) Daily     ? clopidogreL  75 mg Oral Daily     ? escitalopram oxalate  10 mg Oral Nightly     ? gabapentin  300 mg Oral Nightly     ? heparin (porcine)  5,000 Units Subcutaneous Q8H     ? heparin PF in 0.9 % sodium chloride  5 mL Intra-Catheter PRN for Line Care     ? ipratropium-albuteroL  3 mL Nebulization Q6H WA     ? Lidocaine Patch  1 patch Transdermal DAILY PRN     ? LORazepam  0.5 mg Oral Q6H PRN     ? metoprolol succinate XL  25 mg Oral Daily     ? mycophenolate mofetil  1 g Oral BID     ? oxybutynin XL  5 mg Oral Daily     ? [Held by provider] oxyCODONE  10 mg Oral Q12H     ? oxyCODONE  5 mg Oral Q3H PRN     ? pantoprazole  40 mg Oral Daily     ? polyethylene glycol  17 g Oral DAILY PRN     ? prednisoLONE acetate  1 drop Left Eye 4x Daily     ? predniSONE  100 mg Oral Daily     ? [Held by provider] prochlorperazine  10  mg Oral Q6H PRN     ? rosuvastatin  40 mg Oral Daily     ? simethicone  80 mg Oral Q6H PRN     ? sodium chloride  10 mL Intra-Catheter PRN for Line Care     ? sodium chloride  20 mL Intra-Catheter PRN for Line Care     ? sodium chloride  3 mL IV Push Q8H     ? sodium chloride  3 mL IV Push PRN for Line Care     ? tamsulosin  0.4 mg Oral Nightly         [email or web address]    Palliative Performance Scale 50  (Modification of Karnofsky and was designed for measurement of physical status in Palliative Care. Only 10% of patients with PPS score <=50% would be expected to survive >6 months)      Physical Exam  Constitutional:       Comments: Chronically ill appearing male, lethargic, confused.     HENT:      Head: Normocephalic.      Mouth/Throat:      Mouth: Mucous membranes are moist.   Pulmonary:      Comments: Intermittently appears sob  Musculoskeletal:         General: Normal range of motion.      Cervical back: Normal range of motion.   Skin:     General: Skin is warm and dry.   Neurological:      Comments: Lethargic, confused        Vitals:  Temp:  [97.6 ?F (36.4 ?C)-98.9 ?F (37.2 ?C)] 98 ?F (36.7 ?C)  Pulse:  [41-108] 47  Resp:  [17-30] 24  BP: (107-159)/(58-81) 122/65  SpO2:  [90 %-95 %] 92 %     Wt Readings from Last 3 Encounters:   07/04/20 122.3 kg   06/23/20 133.6 kg   06/13/20 (!) 140.6 kg     Review of Labs/Diagnostics  Complete Blood Count:  Recent Labs   Lab 07/02/20  1044 07/03/20  0534 07/04/20  0450   WBC 38.8* 36.5* 34.7*   HGB 9.0* 10.8* 9.9*   HCT 27.8* 33.3* 30.3*   PLT 235 219 240     Comprehensive Metabolic Panel:  Recent Labs   Lab 07/03/20  0534 07/03/20  0534 07/03/20  2201 07/03/20  2235 07/04/20  0450   NA 143  --  142  --  143   K 3.7  --  4.2  --  4.4   CL 104  --  104  --  105   CO2 26  --  25  --  25   BUN 23  --  25*  --  26*   CREATININE 1.39*  --  1.34*  --  1.33*   GLU 113*   < > 123*   < > 98   CALCIUM 8.9  --  8.8  --  8.6*    < > = values in this interval not displayed.     Recent Labs   Lab 07/02/20  1043   ALKPHOS 166*   BILITOT 0.3   PROT 6.1*   ALT 58   AST 26       Diagnostics:  XR Chest PA or AP (Portable)    Result Date: 07/04/2020  Unchanged bilateral pulmonary nodules. Hazy and reticular opacities slightly increased, possibly pulmonary edema. Reported And Signed By: Maudie Flakes, MD  [facility] Radiology and Biomedical Imaging    XR Chest PA or AP (  Portable)    Result Date: 07/02/2020  Increasing interstitial opacities compared to study from 4 days prior which may represent increasing pulmonary edema. Innumerable scattered bilateral pulmonary nodules which are better characterized on the prior chest [place]. Reported And Signed By: Leilani Merl, MD  [facility] Radiology and Biomedical Imaging    XR Chest PA or AP    Result Date: 06/28/2020  Similar appearance of cardiomegaly and central vascular congestion as well as interstitial prominence suggesting pulmonary edema, superimposed on a background of COPD and innumerable lung metastases. A superimposed infectious process would be difficult to exclude radiographically in this setting and should be correlated for clinically. Reported And Signed By: Lavinia Sharps, MD  [facility] Radiology and Biomedical Imaging    [place] wo IV Contrast    Result Date: 07/03/2020  Pontine hypoattenuation without evidence of hemorrhage. No acute intracranial abnormality seen. Report Initiated By:  Allison Quarry, MD Reported And Signed By: Remer Macho, MD  [facility] Radiology and Biomedical Imaging    US Renal    Result Date: 07/04/2020  Limited examination as described above. Stable mild left hydronephrosis. Report Initiated By:  Herbert Deaner, MD Reported And Signed By: Darrell Jewel, MD  [facility] Radiology and Biomedical Imaging    US Renal    Result Date: 06/29/2020  Significantly limited examination as described above. Mild to moderate left hydronephrosis, which was also present on 05/28/2020. 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    # Generalized pain in the setting of?chronic pain, fall, metastatic urothelial cancer  - Recommend Tylenol 650mg  PO q6h PRN for generalized pain   -?Currently on?gabapentin?300mg ?PO HS  -?can consider?lidocaine patch TD 12 hours on, 12 hours off   - Currently on oxycontin 10mg  PO BID -->Recommend holding long acting opioid in the setting of lethargy and AMS.  Can utilize PRNs in necessary.  He has only used 1 PRN in 24 hours.   - Continue oxycodone 5mg  PO Q3H PRN-->can continue for now, monitor renal function, and watch for lethargy or AMS.  Would hold in the setting of sedation, RR<10.  - Warm/ cool compresses as tolerated     # Dyspnea in the setting of metastatic urothelial cancer with mets to the lungs  - Recommend non-pharmacologic interventions: elevate HOB, relaxation/breathing techniques, bedside fan, balance rest with activity  - Supplemental 02 per primary team- nasal cannula    ?  #?Palliative care encounter?in the setting of advanced cancer, decline in functional status, ongoing symptom burden   - No Code-->see ACP note from Hospitalist PA [place]  -?HCP and living will are complete. ?His sister in law [name] is HCP and has copy of original  - Would consider scheduling GOC conversation if there is concern that patient is declining.  Palliative care would be happy to participate once arranged by primary team.   - Palliative care will continue to follow for ongoing symptom management and support. ?We remain available to assist with future GOC meetings as patient's condition evolves. ??  Patient was evaluated: In person    Time committed:  25  minutes>50% of time was spent in patient care/family counseling dealing with complex physical and emotional issues of symptm management and palliative care in the setting of serious and potentially lifethreatening illness.      Signed:  Josephine Igo, FNP-BC  Pallaitive Care   Can be reached via [facility]  07/04/2020  4:35 PM

## 2020-07-05 ENCOUNTER — Inpatient Hospital Stay: Admit: 2020-07-05 | Payer: PRIVATE HEALTH INSURANCE

## 2020-07-05 ENCOUNTER — Encounter: Admit: 2020-07-05 | Payer: PRIVATE HEALTH INSURANCE

## 2020-07-05 ENCOUNTER — Inpatient Hospital Stay: Admit: 2020-07-05 | Payer: PRIVATE HEALTH INSURANCE | Attending: Certified Registered"

## 2020-07-05 DIAGNOSIS — N179 Acute kidney failure, unspecified: Secondary | ICD-10-CM

## 2020-07-05 LAB — CBC WITHOUT DIFFERENTIAL
BKR ANION GAP: 190 x1000/??L (ref 140–440)
BKR CHLORIDE: 35.8 g/dL (ref 31.0–36.0)
BKR WAM ANALYZER ANC: 26.3 x 1000/ÂµL — ABNORMAL HIGH (ref 1.0–11.0)
BKR WAM HEMATOCRIT: 31.3 % — ABNORMAL LOW (ref 37.0–52.0)
BKR WAM HEMOGLOBIN: 11.2 g/dL — ABNORMAL LOW (ref 12.0–18.0)
BKR WAM MCH (PG): 34.1 pg — ABNORMAL HIGH (ref 27.0–31.0)
BKR WAM MCHC: 35.8 g/dL (ref 31.0–36.0)
BKR WAM MCV: 95.4 fL — ABNORMAL HIGH (ref 78.0–94.0)
BKR WAM MPV: 10.2 fL (ref 6.0–11.0)
BKR WAM PLATELETS: 190 x1000/ÂµL (ref 140–440)
BKR WAM RDW-CV: 15.2 % — ABNORMAL HIGH (ref 11.5–14.5)
BKR WAM RED BLOOD CELL COUNT: 3.3 M/??L — ABNORMAL LOW (ref 3.8–5.9)
BKR WAM WHITE BLOOD CELL COUNT: 28.6 x1000/??L — ABNORMAL HIGH (ref 4.0–10.0)

## 2020-07-05 LAB — BASIC METABOLIC PANEL
BKR BLOOD UREA NITROGEN: 30 mg/dL — ABNORMAL HIGH (ref 8–23)
BKR BUN / CREAT RATIO: 20.5 (ref 8.0–23.0)
BKR CALCIUM: 8.6 mg/dL — ABNORMAL LOW (ref 8.8–10.2)
BKR CO2: 26 mmol/L (ref 20–30)
BKR CREATININE: 1.46 mg/dL — ABNORMAL HIGH (ref 0.40–1.30)
BKR EGFR (AFR AMER): 58 mL/min/{1.73_m2} (ref >60)
BKR EGFR (NON AFRICAN AMERICAN): 48 mL/min/{1.73_m2} (ref >60)
BKR GLUCOSE: 104 mg/dL — ABNORMAL HIGH (ref 70–100)
BKR POTASSIUM: 3.7 mmol/L (ref 3.3–5.3)
BKR SODIUM: 145 mmol/L — ABNORMAL HIGH (ref 136–144)

## 2020-07-05 LAB — NT-PROBNPE: BKR B-TYPE NATRIURETIC PEPTIDE, PRO (PROBNP): 2078 pg/mL — ABNORMAL HIGH (ref <125.0)

## 2020-07-05 LAB — TROPONIN T     (Q)
BKR TROPONIN T: 0.02 ng/mL — ABNORMAL HIGH (ref <0.01)
BKR TROPONIN T: 0.02 ng/mL — ABNORMAL HIGH (ref <0.01)

## 2020-07-05 MED ORDER — PHENYLEPHRINE 1 MG/10 ML (100 MCG/ML) IN 0.9 % SOD.CHLORIDE IV SYRINGE
1 mg/0 mL (00 mcg/mL) | INTRAVENOUS | Status: DC | PRN
Start: 2020-07-05 — End: 2020-07-05
  Administered 2020-07-05: 14:00:00 1 mg/0 mL (00 mcg/mL) via INTRAVENOUS

## 2020-07-05 MED ORDER — ONDANSETRON HCL (PF) 4 MG/2 ML INJECTION SOLUTION
4 mg/2 mL | Status: CP
Start: 2020-07-05 — End: ?

## 2020-07-05 MED ORDER — GLYCOPYRROLATE 0.2 MG/ML INJECTION SOLUTION
0.2 mg/mL | Status: CP
Start: 2020-07-05 — End: ?

## 2020-07-05 MED ORDER — ZZ IMS TEMPLATE
Freq: Every day | ORAL | Status: DC
Start: 2020-07-05 — End: 2020-07-07

## 2020-07-05 MED ORDER — EPHEDRINE (PF) 25 MG/5 ML (5 MG/ML) IN 0.9% SODIUM CHLORIDE IV SYRINGE
25 mg/5 mL (5 mg/mL) | Status: CP
Start: 2020-07-05 — End: ?

## 2020-07-05 MED ORDER — PHENYLEPHRINE 1 MG/10 ML (100 MCG/ML) IN 0.9 % SOD.CHLORIDE IV SYRINGE
1 mg/0 mL (00 mcg/mL) | Status: CP
Start: 2020-07-05 — End: ?

## 2020-07-05 MED ORDER — GADOTERATE MEGLUMINE 0.5 MMOL/ML (376.9 MG/ML) INTRAVENOUS SOLUTION
0.5 mmol/mL (376.9 mg/mL) | Freq: Once | INTRAVENOUS | Status: CP | PRN
Start: 2020-07-05 — End: ?
  Administered 2020-07-05: 15:00:00 0.5 mL via INTRAVENOUS

## 2020-07-05 MED ORDER — MORPHINE 2 MG/ML INJECTION SYRINGE
2 mg/mL | Freq: Once | INTRAVENOUS | Status: DC | PRN
Start: 2020-07-05 — End: 2020-07-05

## 2020-07-05 MED ORDER — ALBUTEROL SULFATE 2.5 MG/3 ML (0.083 %) SOLUTION FOR NEBULIZATION
2.5 mg /3 mL (0.083 %) | Freq: Once | RESPIRATORY_TRACT | Status: DC
Start: 2020-07-05 — End: 2020-07-07

## 2020-07-05 MED ORDER — MORPHINE 2 MG/ML INJECTION SYRINGE
2 mg/mL | INTRAVENOUS | Status: DC | PRN
Start: 2020-07-05 — End: 2020-07-07
  Administered 2020-07-06: 03:00:00 2 mL via INTRAVENOUS

## 2020-07-05 MED ORDER — ETOMIDATE 2 MG/ML INTRAVENOUS SOLUTION
2 mg/mL | INTRAVENOUS | Status: DC | PRN
Start: 2020-07-05 — End: 2020-07-05
  Administered 2020-07-05: 14:00:00 2 mg/mL via INTRAVENOUS

## 2020-07-05 MED ORDER — DEXMEDETOMIDINE INFUSION FOR ANESTHESIA
INTRAVENOUS | Status: DC | PRN
Start: 2020-07-05 — End: 2020-07-05
  Administered 2020-07-05: 14:00:00 100.000 mL/h via INTRAVENOUS

## 2020-07-05 MED ORDER — MORPHINE 2 MG/ML INJECTION SYRINGE
2 mg/mL | Freq: Once | INTRAVENOUS | Status: CP | PRN
Start: 2020-07-05 — End: ?
  Administered 2020-07-05: 21:00:00 2 mL via INTRAVENOUS

## 2020-07-05 MED ORDER — PROPOFOL 10 MG/ML INTRAVENOUS EMULSION
10 mg/mL | Status: CP
Start: 2020-07-05 — End: ?

## 2020-07-05 MED ORDER — LIDOCAINE (PF) 20 MG/ML (2 %) INTRAVENOUS SOLUTION
20 mg/mL (2 %) | Status: CP
Start: 2020-07-05 — End: ?

## 2020-07-05 MED ORDER — DEXMEDETOMIDINE 100 MCG/ML INTRAVENOUS SOLUTION
100 mcg/mL | INTRAVENOUS | Status: DC | PRN
Start: 2020-07-05 — End: 2020-07-05
  Administered 2020-07-05: 14:00:00 100 mcg/mL via INTRAVENOUS

## 2020-07-05 MED ORDER — DEXMEDETOMIDINE 100 MCG/ML INTRAVENOUS SOLUTION
100 mcg/mL | Status: CP
Start: 2020-07-05 — End: ?

## 2020-07-05 MED ORDER — LACTATED RINGERS INTRAVENOUS SOLUTION
INTRAVENOUS | Status: DC | PRN
Start: 2020-07-05 — End: 2020-07-05
  Administered 2020-07-05: 14:00:00 via INTRAVENOUS

## 2020-07-05 MED ORDER — METOPROLOL TARTRATE 5 MG/5 ML IV
5 mg/ mL | Status: CP
Start: 2020-07-05 — End: ?

## 2020-07-05 MED ORDER — LORAZEPAM 2 MG/ML INJECTION SOLUTION
2 mg/mL | INTRAVENOUS | Status: DC | PRN
Start: 2020-07-05 — End: 2020-07-07
  Administered 2020-07-05 – 2020-07-06 (×2): 2 mL via INTRAVENOUS

## 2020-07-05 MED ORDER — ALBUTEROL SULFATE 2.5 MG/3 ML (0.083 %) SOLUTION FOR NEBULIZATION
2.5 mg /3 mL (0.083 %) | Status: CP
Start: 2020-07-05 — End: ?

## 2020-07-05 MED ORDER — MORPHINE 2 MG/ML INJECTION SYRINGE
2 mg/mL | Freq: Once | INTRAVENOUS | Status: CP | PRN
Start: 2020-07-05 — End: ?
  Administered 2020-07-05: 20:00:00 2 mL via INTRAVENOUS

## 2020-07-05 MED ORDER — ALBUTEROL SULFATE HFA 90 MCG/ACTUATION AEROSOL INHALER
90 mcg/actuation | Status: CP
Start: 2020-07-05 — End: ?

## 2020-07-05 MED ORDER — ETOMIDATE 2 MG/ML INTRAVENOUS SOLUTION
2 mg/mL | Status: CP
Start: 2020-07-05 — End: ?

## 2020-07-05 NOTE — Progress Notes
[place] [facility]	 [facility]	Surgery Progress Note Attending Provider: Caryl Asp Vasudev,*Interim History/Subjective: - Worsening hypoxia overnight - Confused, disoriented - No code - Foley in place with CYU - renal US with stable mild L hydro- Cr 1.33(1.34) Current Medications: SCHEDULED: Current Facility-Administered Medications Medication Dose Route Frequency Provider Last Rate Last Admin ? aspirin EC delayed release tablet 81 mg  81 mg Oral Daily Axel Filler I, MD   81 mg at 07/04/20 1040 ? carboxymethylcellulose (REFRESH PLUS) 0.5 % ophthalmic solution in dropperette 1 drop  1 drop Both Eyes 4x Daily [place], [place], [place]   1 drop at 07/04/20 2046 ? cetirizine (ZyrTEC) tablet 5 mg  5 mg Oral Daily Adekolu, [name], MD   5 mg at 07/04/20 1040 ? chlorhexidine gluconate (HIBICLENS/BETASEPT) 4 % topical liquid   Topical (Top) Daily Peri [place], [place]   Given at 07/04/20 1050 ? clopidogreL (PLAVIX) tablet 75 mg  75 mg Oral Daily Axel Filler I, MD   75 mg at 07/04/20 1041 ? escitalopram oxalate (LEXAPRO) tablet 10 mg  10 mg Oral Nightly Axel Filler I, MD   10 mg at 07/04/20 2046 ? gabapentin (NEURONTIN) capsule 300 mg  300 mg Oral Nightly [place], [place], [place]   300 mg at 07/04/20 2046 ? heparin (porcine) injection 5,000 Units  5,000 Units Subcutaneous Q8H Axel Filler I, MD   5,000 Units at 07/04/20 1417 ? ipratropium-albuteroL (DUO-NEB) 0.5 mg-3 mg(2.5 mg base)/3 mL nebulizer solution 3 mL  3 mL Nebulization Q6H WA Axel Filler I, MD   3 mL at 07/04/20 2104 ? metoprolol succinate XL (TOPROL-XL) 24 hr tablet 25 mg  25 mg Oral Daily Advani, [name], MD   25 mg at 07/04/20 1041 ? mycophenolate mofetil (CELLCEPT) tablet 1 g  1 g Oral BID Axel Filler I, MD   1 g at 07/04/20 2046 ? oxybutynin XL (DITROPAN-XL) 24 hr tablet 5 mg  5 mg Oral Daily Adekolu, [name], MD   5 mg at 07/04/20 1040 ? [Held by provider] oxyCODONE (OxyCONTIN) 12 hr tablet 10 mg  10 mg Oral Q12H Peri [place], [place]   10 mg at 07/04/20 1041 ? pantoprazole (PROTONIX) EC tablet 40 mg  40 mg Oral Daily Axel Filler I, MD   40 mg at 07/04/20 1041 ? prednisoLONE acetate (PRED FORTE) 1 % ophthalmic suspension 1 drop  1 drop Left Eye 4x Daily [place], [place], [place]   1 drop at 07/04/20 2053 ? predniSONE (DELTASONE) tablet 100 mg  100 mg Oral Daily Axel Filler I, MD   100 mg at 07/04/20 1041 ? rosuvastatin (CRESTOR) tablet 40 mg  40 mg Oral Daily Axel Filler I, MD   40 mg at 07/04/20 1040 ? sodium chloride 0.9 % flush 3 mL  3 mL IV Push Q8H Adekolu, [name], MD   3 mL at 07/03/20 2129 ? tamsulosin (FLOMAX) 24 hr capsule 0.4 mg  0.4 mg Oral Nightly Adekolu, [name], MD   0.4 mg at 07/03/20 2126 INFUSIONS: PRN: acetaminophen, carboxymethylcellulose, heparin PF in 0.9 % sodium chloride, Lidocaine Patch, LORazepam, oxyCODONE, polyethylene glycol, [Held by provider] prochlorperazine, simethicone, sodium chloride, sodium chloride, sodium chloride  Allergies No Known Allergies  Objective: Vitals:Last 24 hours: Temp:  [97.8 ?F (36.6 ?C)-99.5 ?F (37.5 ?C)] 99.5 ?F (37.5 ?C)Pulse:  [41-108] 49Resp:  [18-30] 21BP: (107-134)/(62-81) 109/63SpO2:  [88 %-95 %] 92 %I/O's:Gross Totals (Last 24 hours) at 07/05/2020 0642Last data filed at 07/05/2020 0600Intake -- Output 2525 ml Net -2525 ml  Physical Exam:Gen: Awake, disoriented, anxious Lungs: Normal inspiratory effort on NCAbd: soft, NTNDGU: Foley producing yellow urine some sediment Ext: warm, well perfused x4Neuro: A&O x 3 Patient exam or treatment required medical chaperone.The sensitive parts of the examination were performed with chaperone present: Yes; Chaperone Name, Role/Title: Judie Bonus, MD  Labs:  CBC  Coags Lab Results Component Value Date  WBC 34.7 (H) 07/04/2020  HGB 9.9 (L) 07/04/2020  HCT 30.3 (L) 07/04/2020  PLT 240 07/04/2020  Lab Results Component Value Date  INR 1.00 06/28/2020  PTT 22.5 (L) 05/24/2020    BMP   Ca/Mg/Phos Lab Results Component Value Date  NA 143 07/04/2020  K 4.4 07/04/2020  CL 105 07/04/2020  CO2 25 07/04/2020  BUN 26 (H) 07/04/2020  CREATININE 1.33 (H) 07/04/2020  GLU 98 07/04/2020  Lab Results Component Value Date  CALCIUM 8.6 (L) 07/04/2020  MG 2.3 07/04/2020  PHOS 3.2 07/04/2020    LFTs   Nutrition Lab Results Component Value Date  AST 26 07/02/2020  ALT 58 07/02/2020  ALKPHOS 166 (H) 07/02/2020  BILITOT 0.3 07/02/2020  BILIDIR <0.2 06/17/2020  Lab Results Component Value Date  ALBUMIN 2.9 (L) 07/02/2020    Misc    Lab Results Component Value Date  TROPONINI 0.08 06/28/2020   Imaging:XR Chest PA or AP (Portable)Result Date: 11/8/2021Unchanged bilateral pulmonary nodules. Hazy and reticular opacities slightly increased, possibly pulmonary edema. Reported And Signed By: Maudie Flakes, MD  [facility] Radiology and Biomedical ImagingUS RenalResult Date: 11/8/2021Limited examination as described above. Stable mild left hydronephrosis. 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 Urology consulted for mild to moderate hydronephrosis and urinary retention. Patient is confused and disoriented with worsening respiratory function. Renal US is stable and cr is at baseline. No acute GU intervention Can maintain foley vs CIC but patient at high risk for traumatic self foley removal Signed:Mialani Reicks Yetta Barre, MD - PGY 2Floor Pager (for primary patients): [phone] Pager: [phone] from 6am-6pm. Amion for nights/weekends.11/9/20216:42 AM

## 2020-07-05 NOTE — Plan of Care
[facility]		Spiritual Care NotePurpose: Referral Source: Other Chaplain Observation: People present/Information Obtained From: Daughter Emotional Mood: Frustrated, Anxious Quality of Relational Support: Familiy In-Town and Involved Types of Relational Support: Family, Daughter   Subjective: The patient seemed agitated and frustrated at the time of the visit. The daughter and the husband at bedside also seem frustrated and anxious at the patient's condition. The chaplain provided hospitality, companionship, emotional and spiritual support and offered blessing.Spiritual Assessment:Referral Source: Other Chaplain Information Obtained From: Daughter Mood: Frustrated, AnxiousRelational SupportTypes of Relational Support: Family, Music therapist of Relational Support: Familiy In-Town and Involved Religious Affiliation: Roman Financial planner Resources: Clergy Visits, Faith, Family, Friends, God - Relationship, Prayer         FICA:   and NA  NASpiritual Interventions:Spiritual Intervention Index**Date of Spiritual Visit: 11/09/21Visit Type: Follow Up VisitLanguage or special accommodation rendered?: NoHow Well Do You Speak English?: very wellIntervention Type: Spiritual VisitResponding Chaplain: Hospital PriestType of Ritual/Sacrament: BlessingDate of Blessing Given: 11/09/21Religious Needs: PrayerSpiritual/Religious Support Provided: Companionship, Physicist, medical, Prayer, Spiritual SupportOutcome: OUTCOMES: Expressed Spiritual/Religious Resources or Distress: Expressed gratitude, Expressed hope OUTCOMES: Expressed Emotional Resources or Distress: Decreased anxiety OUTCOMES: Progressed Spiritually: Established chaplain relationship OUTCOMES: Progressed Emotionally or Physically: Increased comfort, Increased satisfaction     Plan: Continued prayerful support   Total Consult Time: 30 minutes	Signed:   Fr. Jaymie Mckiddy AnaetoHospital PriestMHB:[phone]  11/9/20214:27 PM

## 2020-07-05 NOTE — Anesthesia Pre-Procedure Evaluation
This is a 69 y.o. male scheduled for MRI BRAIN W WO IV CONTRAST.Review of Systems/ Medical HistoryPatient summary, nursing notes, Labs, pre-procedure vitals, height, weight and NPO status reviewed.Anesthesia Evaluation:   History of anesthetic complications (recommended video laryngoscopy from previous note): difficult intubation.  Estimated body mass index is 36.58 kg/m? as calculated from the following:  Height as of 06/18/20: 6' (1.829 m).  Weight as of this encounter: 122.3 kg. CC/HPI: Scheduled for MRI BrainPatient presently showing worsening HYPOXIA (07/04/20) was on 6 Liters Oxygen Nasal CannulaHx of metastatic urothelial cancer (s/p 4 cycles of carbo/Taxol/Pembro) now with mets to lung, HFpEF, COPD, obesity, CAD s/p PCI, GERD, sleep apnea (uses CPAP)Recently diagnosed KEYTRUDA (pembrolizumab) ASSOCIATED MYOCARDITIS (on high dose prednisone taper), tobacco (1.5 cigs per day)Recent [place] scan with possible progression of lung mets presented with hypoxia and borderline hypotension. Palliative care was consulted to assist with symptom management. Past Surgical History:  Carotid Stent, Laminectomy, Lumbar Disc Surgery, Knee Surgery, CholecystectomyCardiovascular:Patient has a history of: hypertension.  Dyspnea. -Exercise tolerance: >4 METS -Coronary Artery Disease: CAD -Vascular Disease:  peripheral arterial disease.   -Other Cardiovascular: cardiomyopathy.   . Cardiac Perfusion (05/06/20):Highly abnormal PET myocardial perfusion study following pharmacologic vasodilation with regadenoson and at rest with high risk features. Perfusion imaging was abnormal showing a large sized, moderate to severe intensity, reversible perfusion defect in the basal to apical inferior and inferolateral walls consistent with ischemia; and a medium to large sized, moderate intensity, reversible perfusion defect in the mid to apical anterior and anterolateral walls consistent with ischemia.  There is visual transient ischemic dilation  There was transient post-stress LV dysfunction, resting ejection fraction 40% and stress ejection fraction 36% with abnormal regional wall motion showing global hypokinesis and inferior akinesis.  The left ventricle was severely enlarged in size.  Stress electrocardiogram was abnormal due to ischemic ECG changes.  Kaaawa performed for attenuation correction showed coronary artery calcification in the left main artery (moderate), left anterior descending artery (severe), diagonal artery (severe), left circumflex artery (mild), and right coronary artery (mild).    Echo (06/29/20):Conclusions:- Limited TTE to evaluate ventricular function.- Normal left ventricular size and systolic function. No regional wall motion abnormalities.LVEF calculated by biplane Simpson's was 70%. No thrombus visualized in the left ventricle.- Suboptimal visualization of the right ventricle, but it appears to be normal size with normalfunction.- IVC diameter < 2.1 cm that collapses > 50% with a sniff suggests normal RAP (0-5 mmHg,mean 3 mmHg).- No significant pericardial effusion.- Compared with prior study dated 06/17/2020: Right ventricle was suboptimally visualized onboth studies but appears normal on current study.EKG (07/03/20):  Multiple PVCs.  Respiratory:  Patient has shortness of breath.-Obstructive Sleep apnea:   yes, CPAP use.  -Lung Disorders: -COPD:  yes-Comments: Chest X-ray (07/04/20): multiple bilateral pulmonary nodules. Hazy and reticular opacities appear to be slightly increased -Nicotine Dependence: yes, smoking Neuromuscular:    Patient has a history of decreased mental status.Skeletal/Skin:  NegativeGastrointestinal/Genitourinary: -Gastrointestinal Disorders:  Patient has GERD.-Nutritional Disorders: Patient has has increased body weight- obesity.Hematological/Lymphatic: -Anemia: Patient has anemia.  -Coagulopathy:  Patient has blood dyscrasia.Endocrine/Metabolic: -Diabetes mellitus:  Patient has diabetes mellitus.Behavioral/Psychiatric & Syndromes: NegativeAdditional Findings: Labs  (07/04/20)Na  143K  4.4Creatinine  1.33  HBUN  26   HCa   8.6  LPro-BNP  2,469  HTroponin T  0.02  HWBC   34.7  HHgb    9.9  LPlatelets   240 Physical ExamCardiovascular:    normal exam  Pulmonary:  normal exam  wheezingAirway:  Mallampati:  IITM distance: >3 FBNeck ROM: fullDental:  Dentition: missingAnesthesia PlanASA 3 The primary anesthesia plan is  general. tivaPerioperative Code Status confirmed: It is my understanding that the patient is currently designated as 'Full Code' and will remain so throughout the perioperative period.Anesthesia informed consent obtained. Consent obtained from: patientUse of blood products: consented    Consent Comment: Plan discussed with CRNA providerThe post operative pain plan is IV analgesics.Plan discussed with Attending and CRNA.Anesthesiologist's Pre Op NoteI personally evaluated and examined the patient prior to the intra-operative phase of care.

## 2020-07-05 NOTE — Plan of Care
[facility]		Spiritual Care NotePurpose: Referral Source: Palliative Care Observation: People present/Information Obtained From: Daughter, Patient, Other Family Member Emotional Mood: Sleepy, Anxious Quality of Relational Support: Familiy In-Town and Involved Types of Relational Support: Family, Sibling, Son, Daughter Family Dynamics: Pt is divorced and has a son and a daughter.  Pt recently sold his home in [place] and moved back to [place] to be closer to his family.  Pt lives with his niece.  Pt's primary support and HCR is [name], his former sister in Social worker. Subjective: Pt lying in bed, agitated, minimally engaged, sleepy.  Pt's daughter and her husband bedside, shared anticipatory grief and life review.  Pt identifies as RC and family would appreciate Last Rites as they come to terms with the seriousness of pt's illness.  Chaplain referred to hospital priest, provided spiritual and emotional support.Spiritual Assessment:Referral Source: Palliative Care Information Obtained From: Daughter, Patient, Other Family Member Mood: Sleepy, AnxiousRelational SupportTypes of Relational Support: Family, Sibling, Son, DaughterFamily Dynamics: Pt is divorced and has a son and a daughter.  Pt recently sold his home in [place] and moved back to [place] to be closer to his family.  Pt lives with his niece.  Pt's primary support and HCR is [name], his former sister in Social worker.Quality of Relational Support: Familiy In-Town and Involved Religious Affiliation: Designer, television/film set Aware/Contacted: Chaplain Will NotifySpiritual Resources: Faith, Family, Life Programmer, applications, Holiday representative Religious Practices: Surveyor, minerals, Life Story SharingRitual/Sacrament: Last Rites Type of Communion: CatholicFrequency of Communion: DailyIssues RaisedRaised by Patient: Adjustment to IllnessRaised by Family: Anticipatory Grief, Concern about Patient's Outcome, Disruption of Family Life, Strain of Hospitalization/Treatment Faith: Spiritual Beliefs: yes FICA: FICA Spiritual Assessment ToolFaith: Spiritual/Religious: yesFaith: Spiritual Beliefs: yesFaith: Life Meaning: his new grand babies, familyImportance: Faith/Belief: helpful for copingNeed for spiritual support : Yes   Spiritual Interventions:Spiritual Intervention Index**Date of Spiritual Visit: 11/09/21Visit Type: Follow Up VisitLanguage or special accommodation rendered?: NoIntervention Type: Spiritual VisitResponding Chaplain: Palliative CareConsulted With: Palliative Care, Other ChaplainReligious Needs: PrayerSpiritual/Religious Support Provided: Family Support, Spiritual Support, Life Review, Introduction to Chaplaincy ServicesReferrals Made To: Palliative Care, Hospital PriestFollow-Up Visit Needed: YesOutcome: OUTCOMES: Expressed Spiritual/Religious Resources or Distress: Utilized spiritual resources/practices, Expressed gratitude, Identified important values and beliefs, Identified priorities OUTCOMES: Expressed Emotional Resources or Distress: Emotions Expressed/Distress Reduced, Decreased anxiety, Expressed grief, Emotional resources utilized, Relational resources utilized OUTCOMES: Progressed Spiritually: Established chaplain relationship, Progressed toward trust, Received desired ritual or practice OUTCOMES: Progressed Emotionally or Physically: Increased satisfaction, Increased comfort, Progressed toward acceptanceOUTCOMES: Goals of Care: Improved communication with medical staff    Plan: Palliative chaplain will continue to provide spiritual support for pt and family as needed.Referrals Made To: Palliative Care, [facility]  Total Consult Time: 18 minutes	Signed: Rosana Hoes, D.Min., BCCChaplain: Palliative Care Pamala Hurry  8:00-4:30: [phone] other times: [phone]/9/20213:02 PMPlan of Care Overview/ Patient Status

## 2020-07-05 NOTE — Plan of Care
Problem: Adult Inpatient Plan of CareGoal: Readiness for Transition of CareOutcome: Interventions implemented as appropriatePlan of Care Overview/ Patient Status    PT recommendation STR. TC to send referrals. Case Management will continue to follow with team. Martisha Toulouse MSN, RN, CCM [facility] Case ManagerOffice [phone] [phone]

## 2020-07-05 NOTE — Plan of Care
Problem: Adult Inpatient Plan of CareGoal: Readiness for Transition of CareOutcome: Interventions implemented as appropriatePlan of Care Overview/ Patient Status    CM s/w MD and sister Verdis Frederickson [phone]. Family inquiring about [facility] with hospice. Family had no preference of hospice agency. Referral sent to [facility] and [facility]. [facility] to reach out to family. Sister does not want [facility]. Case Management will continue to follow with team. Jeanice Lim MSN, RN, CCM [facility] Case ManagerOffice [phone]

## 2020-07-05 NOTE — Plan of Care
Plan of Care Overview/ Patient Status    1900-0700Patient is Alert to self. Extremely restless and very anxious. Given ativan po with no effect. Praying to god, saying help me repeatedly. VSS on 3 L Nc. Goal is above 88%. Very tachypneic to the 40's, mouth breathing. Unable to tolerate CPAP, provider aware. Incontinent of 1 large Bm, foley in place. Draining darker urine with sediment. Frequently pulling off O2, pulled port needle out of chest. R chest port re-accessed, + blood return. Bilateral mitts and wrists in place. Specialty bed in place. Weight shift assistance provided. Ax1-2 in bed. Taking pills whole one at a time. Medicated as ordered. Safety maintained.

## 2020-07-05 NOTE — Other
Post Anesthesia Transfer of Care NotePatient: Nechemia Chiappetta AstorinoProcedure(s) Performed: * No procedures listed * Patient location: PACU Last Vitals: Vitals Value Taken Time BP 94/46 07/05/20 0944 Temp 37 ?C 07/05/20 0944 Pulse 102 07/05/20 0944 Resp 16 07/05/20 0944 SpO2 100 % 07/05/20 0944 Level of consciousness: awakeTransport Vital Signs:  Stable since the last set of recorded intra-operative vital signsComplications: noneIntra-operative Intake & Output and Antibiotics as per Anesthesia record and discussed with the RN.

## 2020-07-05 NOTE — Plan of Care
Plan of Care Overview/ Patient Status    0700-1900Pt A&O x 1, self only. VSS ex Tachypnea. On 4L NC, meeting goal.  Difficult to assess pain d/t pain confusion and alertness inconsistent. Pt restless majority of shift, anxious, throwing self around bed. Oxy & PO ativan given initially, no relief whatsoever. Covering PA contacted, stated they had to speak with palliative care. 0.5mg  IV push Morphine ordered, given w/minimal relief. Covering PA contacted again, 1mg  IV push morphine ordered, given w/moderate relief. Pt still restless through parts of shift after morphine. Family at bedside majority of shift, daughter states she wants her father to continue any drugs that relieve his pain and make him more comfortable no matter what. I assured her I would try and pass that on to next nurse and provider. Attending @ bedside this afternoon about patients deteriorating condition, stated she was going to speak with family and PA. Bilat mitts in place this shift to prevent pulling of lines. Pt went to MRI of brain this shift. Foley in place, catheter care completed this shift. Dark sediment urine as output. No BM this shift. Skin intact, assist T&R. Poor PO intake, attending notified @ bedside. Pt states he doesn't want to eat anything or take his meds. Medicated per MAR. Reached out to PA about updating family, she came to bedside this shift. EKG attempted @1700  pt restless and unable to stay still. [facility] nurses @ bedside , also concerned about patients state. Attending ordered IV ativan, given at end of this shift.

## 2020-07-05 NOTE — Other
Operative Diagnosis:Pre-op:    Lung ca with Brain metastisisPatient Coded Diagnosis   None  Patient Diagnosis   None    * No Diagnosis Codes entered *Operative Procedure(s) :* No procedures listed *Post-op Procedure & Diagnosis ConfirmationPost-op Diagnosis: Post-op Diagnosis updated (see notes)     - Lung ca with Brain metastisisPost-op Procedure: Post-op Procedure updated (see notes)     - MRI BRAIN W WO IV CONTRAST

## 2020-07-05 NOTE — Care Coordination-Inpatient
Referral placed to [facility] seeking [facility] per request of provider.

## 2020-07-05 NOTE — Other
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 Palliative care was consulted to assist with symptom management.     Chart Reviewed.  Spoke with covering provider.   Pt son at bedside this afternoon. Pt remains lethargic falling asleep during conversation and agitated. He is in 2 point wrist restraints and mitts with his legs hanging over side rail of bed. Pt bulling out lines and ivs last night.     Pt son notes that he is still not close to his normal baseline. Pt son asking about STR and when discharge will be. Spoke about further need for deeper discussion on pt wishes. Awaiting MRI results this afternoon.  Currently holding long acting pain medications. On going support provided.     Palliative Care Review of Systems  Unable to assess due to medical condition    General Review of Systems  Unable to assess    Review of Allergies/Hx/Meds   Allergies  Patient has No Known Allergies.      InPatient Medications  Current Facility-Administered Medications   Medication Dose Route Frequency Last Rate   ? acetaminophen  650 mg Oral Q6H PRN     ? albuterol  2.5 mg Nebulization Once     ? aspirin  81 mg Oral Daily     ? carboxymethylcellulose  1 drop Both Eyes 4x Daily     ? carboxymethylcellulose  1 drop Both Eyes TID PRN     ? cetirizine  5 mg Oral Daily     ? chlorhexidine gluconate   Topical (Top) Daily     ? clopidogreL  75 mg Oral Daily     ? escitalopram oxalate  10 mg Oral Nightly     ? gabapentin  300 mg Oral Nightly     ? heparin (porcine)  5,000 Units Subcutaneous Q8H     ? heparin PF in 0.9 % sodium chloride  5 mL Intra-Catheter PRN for Line Care     ? ipratropium-albuteroL  3 mL Nebulization Q6H WA     ? Lidocaine Patch  1 patch Transdermal DAILY PRN     ? LORazepam  0.5 mg Oral Q6H PRN     ? metoprolol succinate XL  25 mg Oral Daily     ? mycophenolate mofetil  1 g Oral BID     ? oxybutynin XL  5 mg Oral Daily     ? [Held by provider] oxyCODONE  10 mg Oral Q12H     ? oxyCODONE  5 mg Oral Q3H PRN     ? pantoprazole  40 mg Oral Daily     ? polyethylene glycol  17 g Oral DAILY PRN     ? [START ON 07/13/2020] predniSONE  90 mg Oral Daily     ? rosuvastatin  40 mg Oral Daily     ? simethicone  80 mg Oral Q6H PRN     ? sodium chloride  10 mL Intra-Catheter PRN for Line Care     ? sodium  chloride  20 mL Intra-Catheter PRN for Line Care     ? sodium chloride  3 mL IV Push Q8H     ? sodium chloride  3 mL IV Push PRN for Line Care     ? tamsulosin  0.4 mg Oral Nightly         [email or web address]    Palliative Performance Scale 50  (Modification of Karnofsky and was designed for measurement of physical status in Palliative Care. Only 10% of patients with PPS score <=50% would be expected to survive >6 months)      Physical Exam  Vitals and nursing note reviewed.   Constitutional:       Appearance: Ill appearance: critically ill, falling asleep, very fatigued    HENT:      Head: Normocephalic and atraumatic.   Pulmonary:      Comments: Intermittently appears sob  Musculoskeletal:         General: Normal range of motion.   Skin:     General: Skin is warm and dry.   Neurological:      Mental Status: He is alert. He is disoriented.      Comments: Lethargic, confused not at basline, having difficult time staying awake   Psychiatric:         Behavior: Behavior is agitated.        Vitals:  Temp:  [97.8 ?F (36.6 ?C)-99.5 ?F (37.5 ?C)] 98.4 ?F (36.9 ?C)  Pulse:  [49-103] 103  Resp:  [16-27] 27  BP: (92-134)/(46-79) 106/67  SpO2:  [92 %-100 %] 94 %     Wt Readings from Last 3 Encounters:   07/04/20 122.3 kg   06/23/20 133.6 kg   06/13/20 (!) 140.6 kg     Review of Labs/Diagnostics  Complete Blood Count:  Recent Labs   Lab 07/03/20  0534 07/04/20  0450 07/05/20  0644   WBC 36.5* 34.7* 28.6*   HGB 10.8* 9.9* 11.2*   HCT 33.3* 30.3* 31.3*   PLT 219 240 190     Comprehensive Metabolic Panel:  Recent Labs   Lab 07/03/20  2201 07/03/20  2235 07/04/20  0450 07/04/20  0450 07/05/20  0644   NA 142  --  143  --  145*   K 4.2  --  4.4  --  3.7   CL 104  --  105  --  106   CO2 25  --  25  --  26   BUN 25*  --  26*  --  30*   CREATININE 1.34*  --  1.33*  --  1.46*   GLU 123*   < > 98   < > 104*   CALCIUM 8.8  --  8.6*  --  8.6*    < > = values in this interval not displayed.     Recent Labs   Lab 07/02/20  1043   ALKPHOS 166*   BILITOT 0.3   PROT 6.1*   ALT 58   AST 26     Results for orders placed or performed during the hospital encounter of 06/28/20   EKG   Result Value Ref Range    ECG - HEART RATE 88 bpm    ECG - QRS Interval 111 ms    ECG - QT Interval 395 ms    ECG - QTC Interval 479 ms    ECG - P Axis 52 deg    ECG - QRS Axis 8 deg    ECG -  T Wave Axis 121 deg    ECG -- P-R Interval 154 msec    ECG - SEVERITY Abnormal ECG severity       Diagnostics:  XR Chest PA or AP (Portable)    Result Date: 07/04/2020  Unchanged bilateral pulmonary nodules. Hazy and reticular opacities slightly increased, possibly pulmonary edema. Reported And Signed By: Maudie Flakes, MD  [facility] Radiology and Biomedical Imaging    XR Chest PA or AP (Portable)    Result Date: 07/02/2020  Increasing interstitial opacities compared to study from 4 days prior which may represent increasing pulmonary edema. Innumerable scattered bilateral pulmonary nodules which are better characterized on the prior chest [place]. Reported And Signed By: Leilani Merl, MD  [facility] Radiology and Biomedical Imaging    [place] wo IV Contrast    Result Date: 07/03/2020  Pontine hypoattenuation without evidence of hemorrhage. No acute intracranial abnormality seen. Report Initiated By:  Allison Quarry, MD Reported And Signed By: Remer Macho, MD  [facility] Radiology and Biomedical Imaging    MRI Brain w wo IV Contrast    Result Date: 07/05/2020  Incomplete exam in addition to motion artifact on multiple obtained sequences. Focus of restricted diffusion in the left parietal lobe white matter, may represent an acute infarct. Without IV contrast metastatic disease cannot be completely excluded. Additional few foci of faint restricted diffusion as described above, may represent artifact, difficult to confirm on limited exam. Report Initiated By:  Sudie Grumbling, MD Reported And Signed By: Forrestine Him, MD  [facility] Radiology and Biomedical Imaging    US Renal    Result Date: 07/04/2020  Limited examination as described above. Stable mild left hydronephrosis. Report Initiated By:  Herbert Deaner, MD Reported And Signed By: Darrell Jewel, MD  [facility] Radiology and Biomedical Imaging    US Renal    Result Date: 06/29/2020  Significantly limited examination as described above. Mild to moderate left hydronephrosis, which was also present on 05/28/2020. 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    # Generalized pain in the setting of?chronic pain, fall, metastatic urothelial cancer  - Recommend Tylenol 650mg  PO q6h PRN for generalized pain   -?Currently on?gabapentin?300mg ?PO HS  -?can consider?lidocaine patch TD 12 hours on, 12 hours off   - Currently on oxycontin 10mg  PO BID -->Continue  holding long acting opioid in the setting of lethargy and AMS.  Can utilize PRNs in necessary.  He has only used 1 PRN in 24 hours.   - Continue oxycodone 5mg  PO Q3H PRN-->can continue for now, monitor renal function, and watch for lethargy or AMS.  Would hold in the setting of sedation, RR<10.  -trial low dose morphine 2 mg iv q 4 hrs prn pain   - Warm/ cool compresses as tolerated     # Dyspnea in the setting of metastatic urothelial cancer with mets to the lungs  - Recommend non-pharmacologic interventions: elevate HOB, relaxation/breathing techniques, bedside fan, balance rest with activity  - Supplemental 02 per  primary team- nasal cannula    # Agitation in the setting of advanced metastatic disease  -recheck EKG last qtc was elevate(497, 11/2)  -ativan 0.5 mg iv q 6 hrs prn   -after recheck of ekg if qtc is decreasing can consider tiral of haldol 0.5 mg iv q 6 hrs prn use with close monitoring as it could prolong qtc interval further  -if unable to use haldol and pt remains agitated consider trial of risperidone 0.25 mg po bid .       ?  #?Palliative care encounter?in the setting of advanced cancer, decline in functional status, ongoing symptom burden   - No Code-->see ACP note from Hospitalist PA [place]  -?HCP and living will are complete. ?His sister in law [name] is HCP and has copy of original  - Would consider scheduling GOC conversation if there is concern that patient is declining.  Palliative care would be happy to participate once arranged by primary team.   - Palliative care will continue to follow for ongoing symptom management and support. ?We remain available to assist with future GOC meetings as patient's condition evolves. ??    Patient was evaluated: In person    Time committed:  25  minutes>50% of time was spent in patient care/family counseling dealing with complex physical and emotional issues of symptm management and palliative care in the setting of serious and potentially lifethreatening illness.      Signed:  Nyra Capes, APRN   Palliative Care   Can be reached via Mobile Heartbeat  07/05/2020   4:47 PM

## 2020-07-05 NOTE — Progress Notes
[facility] Progress NoteAttending Provider: Coy Saunas, MD [phone]                                            Subjective: Interim History: Patient seen with his family at the bedside - son, Weston Brass, daughter, Sunny Schlein, and son-in-law, Brett Canales. Family also had patient's sister/POA, on the phone, [name]. Explained to family that despite having anesthesia for MRI, patient still did not tolerate it. Unable to get clear images due to motion artifact. I explained that likely due to patient's breathing and MS, anesthesia did not feel they could safely give him enough anesthesia to keep him still for the test. Family asking if MRI brain needed in order to pursue hospice. I explained that MRI is not needed to pursue hospice, and in fact, if that is the route they were to choose, I would recommend not pursuing repeat MRI, as it will not change management. I explained that MRI brain is being done to help determine the etiology of his AMS, to determine if there is something that can be done to correct this. Family understands that even if MRI brain has a cause, there may not be anything that can be done at this point. Family expresses understanding his poor prognosis, and do not want him to suffer. Daughter, Sunny Schlein, asked patient if he wanted to try to repeat MRI and patient reports he did. Family in agreement with repeat MRI, but ultimately want patient to be safe. Family also requesting Hospice evaluation - just to learn their options.I spoke with anesthesiologist over the phone. She explained that given patient's breathing patters and AMS, it limited the anesthesia choices. She reports trialing precedex, but patient was unable to tolerate laying flat on the table given lower back pain. Reports Precedex takes 5-10 minutes to take effect, and patient could not wait that long after being on the table. She did not want to give Fentanyl given respiratory status. Also explained Precedex can cause bradycardia and patient borderline bradycardic. Anesthesiologist reports she would be willing to try again if low back pain better controlled (although it may be a different anesthesiologist on when patient goes back down).Review of Allergies/Meds/Hx: Review of Allergies/Meds/Hx:I have reviewed the patient's: current scheduled medications, current infusions, current prn medications and past medical historyObjective: Vitals:I have reviewed the patient's current vital signs as documented in the patient's EMR.   and Last 24 hours: Temp:  [97.8 ?F (36.6 ?C)-99.5 ?F (37.5 ?C)] 98.6 ?F (37 ?C)Pulse:  [44-102] 75Resp:  [16-27] 22BP: (92-134)/(46-79) 120/53SpO2:  [88 %-100 %] 94 %I/O's:I have reviewed the patient's current I&O's as documented in the EMR.Gross Totals (Last 24 hours) at 07/05/2020 1425Last data filed at 07/05/2020 1300Intake 100 ml Output 2470 ml Net -2370 ml Procedures:None Physical Exam:Physical ExamConstitutional:     Comments: Sitting up in bed resting HENT:    Head: Normocephalic. Pulmonary:    Comments: TachpneicAbdominal:    General: There is distension.    Comments: Belly breathing Musculoskeletal:    Right lower leg: No edema.    Left lower leg: No edema.    Comments: Bilateral mitts and wrist restraints in place Neurological:    Comments: Opens his eyes to verbal stimuli but goes back to sleep. Does answer some questions Labs:I have reviewed the patient's labs within the last 24 hrs.Na 145K 3.7Cr 1.346Glucose 104BNP 2469 --> 2078WBC 28.6H&H 11.1//31.3Platelets  190Diagnostics:I have reviewed the patient's Radiology report(s) within the last 48 hrs. Significant findings are   . MRI Brain w wo IV Contrast [[id number]] Collected: 07/05/20 1006 Incomplete exam in addition to motion artifact on multiple obtained sequences. Focus of restricted diffusion in the left parietal lobe white matter, may represent an acute infarct. Without IV contrast metastatic disease cannot be completely excluded. Additional few foci of faint restricted diffusion as described above, may represent artifact, difficult to confirm on limited exam. 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 Course c/b AMS.Plan: Acute hypoxic respiratory failure - s/p 40 mg IV Lasix yesterday AM with positive effect- Today with slight bump in Cr- On 4 L NC- Will monitor for now - can give additional dose of Lasix PRN- Continue standing nebs- Monitor I/O and daily weightsMyocarditis- Continue Cellcept 1g BID- Continue prednisone 100 mg daily (day 7/7) - taper to 90 mg tomorrow (by 10 mg/week)- Continue metop 25 mg daily for PVC burdenAMS- Waxing and waning MS - restlessness worsened overnight requiring restraints- [place] with no acute findings to explain AMS. Appreciate neuro review of pontine hypo attenuation- MRI brain done this morning reviewed by neuro resident, Dr. Geoffery Spruce. Too much motion artifact - very poor study. Cannot make determination based on this MRI and would need repeat. Spoke with [name] again this afternoon. Explained my conversation with anesthesia. Expressed concerns that patient undergoing another MRI may worsen his MS given high amounts of sedation, or his body may not tolerate the anesthesia. [name] understands the safety risk and would like to await hospice eval before proceeding with repeat MRI. - Trialing morphine 1 mg IV once to determine - RN will trial patient off wrist restraints. Keep mitts in place for now, as daughter did not feel comfortable with patient out of both wrist restraints and mitts- Reorient frequently- Will monitor AKI on CKD- Appreciate urology consult- Resolved- Foley in place- Repeat renal US with stable hydroLeukocytosis- Likely iso steroid use- No signs of infection at this timeCAD- Continue ASA and Plavix- Continue statinUrothelial ca- Continue Flomax 0.4 mg QHS - Continue oxybutynin 5 mg daily - Holding standing Oxycontin given AMS- Continue PRN oxycodone - increased frequency to Q3H PRNOSA- Continue CPAPL traumatic iritis- Appreciate ophtho consult- Continue pred gtt day #5/5- Continue Refresh gttCode Status: DNR/DNIMed Rec: completeDiet: CardiacPPx: SQHDispo: pending improvement in MS/hospice eval Electronically Signed:Lindsey Romano, PA-CContact via MHB11/04/2020 2:25 PM-------------------Attending addendumI have independently examined and evaluated the patient and I agree with my PA Mardella Layman Romano's findings assessment and plan.Noted that he didn't tolerate the MRI. He continues to be dyspneic, restless, pulling out IVs. We were cautious about  treating his delirium with ativan and Morphine, for the concern of worsening his respiratory status which was already tenuous.He was more alert yesterday answering  Questions but today lethargic, hyperactive. Mitts and wrist restraints in placeLungs diminished sounds anteriorlys1s2 normalSoft abd BS+Labs noted rising crAs the day progressed family wanted to discuss options with  hospice team- I am told that they are considering it.In the meantime, I have ordered PRN IV Morphine & Lew Dawes, MD

## 2020-07-05 NOTE — Other
Patient completed MRI w/ anaesthesia, tolerated well. VSS. Patient restless, denies pain. Bilateral wrist restraints and mitts in place. Report given to RN on 6-7. Transported back to floor with transporter and PCA.  BP (!) 120/53  - Pulse 75  - Temp 98.6 ?F (37 ?C) (Temporal)  - Resp (!) 26  - Wt 122.3 kg  - SpO2 94%  - BMI 36.58 kg/m?

## 2020-07-05 NOTE — Anesthesia Post-Procedure Evaluation
Anesthesia Post-op NotePatient: Paul Else AstorinoProcedure(s):  * No procedures listed * Patient location: PACULast Vitals:  I have noted the vital signs as listed in the nursing notes.Mental status recovered: patient participates in evaluation: YesVital signs reviewed: YesRespiratory function stable:YesAirway is patent: YesCardiovascular function and hydration status stable: YesPain control satisfactory: YesNausea and vomiting control satisfactory:Yes

## 2020-07-06 DIAGNOSIS — C7931 Secondary malignant neoplasm of brain: Secondary | ICD-10-CM

## 2020-07-06 DIAGNOSIS — N400 Enlarged prostate without lower urinary tract symptoms: Secondary | ICD-10-CM

## 2020-07-06 DIAGNOSIS — Z7982 Long term (current) use of aspirin: Secondary | ICD-10-CM

## 2020-07-06 DIAGNOSIS — N179 Acute kidney failure, unspecified: Secondary | ICD-10-CM

## 2020-07-06 DIAGNOSIS — F419 Anxiety disorder, unspecified: Secondary | ICD-10-CM

## 2020-07-06 DIAGNOSIS — E1122 Type 2 diabetes mellitus with diabetic chronic kidney disease: Secondary | ICD-10-CM

## 2020-07-06 DIAGNOSIS — I5043 Acute on chronic combined systolic (congestive) and diastolic (congestive) heart failure: Secondary | ICD-10-CM

## 2020-07-06 DIAGNOSIS — Z20822 Contact with and (suspected) exposure to covid-19: Secondary | ICD-10-CM

## 2020-07-06 DIAGNOSIS — G629 Polyneuropathy, unspecified: Secondary | ICD-10-CM

## 2020-07-06 DIAGNOSIS — C7801 Secondary malignant neoplasm of right lung: Secondary | ICD-10-CM

## 2020-07-06 DIAGNOSIS — E669 Obesity, unspecified: Secondary | ICD-10-CM

## 2020-07-06 DIAGNOSIS — M419 Scoliosis, unspecified: Secondary | ICD-10-CM

## 2020-07-06 DIAGNOSIS — I409 Acute myocarditis, unspecified: Secondary | ICD-10-CM

## 2020-07-06 DIAGNOSIS — E785 Hyperlipidemia, unspecified: Secondary | ICD-10-CM

## 2020-07-06 DIAGNOSIS — N261 Atrophy of kidney (terminal): Secondary | ICD-10-CM

## 2020-07-06 DIAGNOSIS — Z9861 Coronary angioplasty status: Secondary | ICD-10-CM

## 2020-07-06 DIAGNOSIS — C7802 Secondary malignant neoplasm of left lung: Secondary | ICD-10-CM

## 2020-07-06 DIAGNOSIS — I493 Ventricular premature depolarization: Secondary | ICD-10-CM

## 2020-07-06 DIAGNOSIS — N1330 Unspecified hydronephrosis: Secondary | ICD-10-CM

## 2020-07-06 DIAGNOSIS — J9601 Acute respiratory failure with hypoxia: Secondary | ICD-10-CM

## 2020-07-06 DIAGNOSIS — Z8551 Personal history of malignant neoplasm of bladder: Secondary | ICD-10-CM

## 2020-07-06 DIAGNOSIS — I2510 Atherosclerotic heart disease of native coronary artery without angina pectoris: Secondary | ICD-10-CM

## 2020-07-06 DIAGNOSIS — K219 Gastro-esophageal reflux disease without esophagitis: Secondary | ICD-10-CM

## 2020-07-06 DIAGNOSIS — F32A Depression, unspecified: Secondary | ICD-10-CM

## 2020-07-06 DIAGNOSIS — I427 Cardiomyopathy due to drug and external agent: Secondary | ICD-10-CM

## 2020-07-06 DIAGNOSIS — Z7902 Long term (current) use of antithrombotics/antiplatelets: Secondary | ICD-10-CM

## 2020-07-06 DIAGNOSIS — Z66 Do not resuscitate: Secondary | ICD-10-CM

## 2020-07-06 DIAGNOSIS — J449 Chronic obstructive pulmonary disease, unspecified: Secondary | ICD-10-CM

## 2020-07-06 DIAGNOSIS — Z6839 Body mass index (BMI) 39.0-39.9, adult: Secondary | ICD-10-CM

## 2020-07-06 DIAGNOSIS — Z79899 Other long term (current) drug therapy: Secondary | ICD-10-CM

## 2020-07-06 DIAGNOSIS — R630 Anorexia: Secondary | ICD-10-CM

## 2020-07-06 DIAGNOSIS — G4733 Obstructive sleep apnea (adult) (pediatric): Secondary | ICD-10-CM

## 2020-07-06 DIAGNOSIS — F17210 Nicotine dependence, cigarettes, uncomplicated: Secondary | ICD-10-CM

## 2020-07-06 DIAGNOSIS — I951 Orthostatic hypotension: Secondary | ICD-10-CM

## 2020-07-06 DIAGNOSIS — C689 Malignant neoplasm of urinary organ, unspecified: Secondary | ICD-10-CM

## 2020-07-06 DIAGNOSIS — I252 Old myocardial infarction: Secondary | ICD-10-CM

## 2020-07-06 DIAGNOSIS — N1831 Chronic kidney disease, stage 3a: Secondary | ICD-10-CM

## 2020-07-06 DIAGNOSIS — I130 Hypertensive heart and chronic kidney disease with heart failure and stage 1 through stage 4 chronic kidney disease, or unspecified chronic kidney disease: Secondary | ICD-10-CM

## 2020-07-06 DIAGNOSIS — K5900 Constipation, unspecified: Secondary | ICD-10-CM

## 2020-07-06 DIAGNOSIS — G928 Other toxic encephalopathy: Secondary | ICD-10-CM

## 2020-07-06 DIAGNOSIS — H209 Unspecified iridocyclitis: Secondary | ICD-10-CM

## 2020-07-06 DIAGNOSIS — G8929 Other chronic pain: Secondary | ICD-10-CM

## 2020-07-06 DIAGNOSIS — G4700 Insomnia, unspecified: Secondary | ICD-10-CM

## 2020-07-06 DIAGNOSIS — D72829 Elevated white blood cell count, unspecified: Secondary | ICD-10-CM

## 2020-07-06 NOTE — Progress Notes
[facility] received. Report obtained from staff RN. Writer and fellow [facility] Admissions Nurse reviewed chart and went to bedside to evaluate Patient. Patient was visibly uncomfortable, moving back and forth as if in pain, uncomfortable, agitated. 02 in place via NC, Patient mouth breathing and became SOB easily. AMS, mitts in place to prevent pulling IVs and Foley. Family at bedside included his sister and adult children, all whom verbalized feeling upset to see their loved one in this manner.Writer provided presentation of services, discussed hospice philosophy,reviewed levels of care. POA, Patient's ex wife, was available via phone, and Clinical research associate provided presentation to her as well as family present at bedside. POA stated she would appreciate [facility] assistance in making Patient more comfortable and would like to pursue d/c to a SNF in [place], [place] or [place], where [facility] can provide routine hospice care in that setting. Due to POA driving agreed upon follow up phone call tomorrow morning to answer further questions and discuss needs further. Oncall CP contacted and situation of status explained. Orders placed for small amounts of prn Ativan and morphine. This [facility] Nurse advocates for utilization of the prn meds as ordered to provide some reduction in patient's symptoms.Thank you for this referral to [facility],Duron Meister RNAdmission NurseVitas [phone] (9am-8pm)

## 2020-07-06 NOTE — Plan of Care
Plan of Care Overview/ Patient Status    07:00-10:00 pt had expired on previous shift Family at bedside paying their respectPost mortem  care donePrior of leaving unit to morgue

## 2020-07-06 NOTE — Plan of Care
[facility]		Spiritual Care NotePurpose: Referral Source: Nurse, Support Staff Observation: People present/Information Obtained From: Other (Comment), Other Family Member Emotional Mood: Patient Unconscious Quality of Relational Support: Familiy In-Town and Involved, Family Out-of-Town but Involved Types of Relational Support: Other (Comment), Family   Subjective: RN paged notifying demise. Patient had been provided sacrament of sick recently. Large family (siblings) present and more family members including children are expected to come. Chaplain prayed with family and offered blessing. Spiritual Assessment:Referral Source: Nurse, Support Staff Information Obtained From: Other (Comment), Other Family Member Mood: Patient UnconsciousRelational SupportTypes of Relational Support: Other (Comment), FamilyQuality of Relational Support: Familiy In-Town and Involved, Family Out-of-Town but Involved Religious Affiliation: Other (Comment)  Spiritual Resources: God - Relationship, Other (Comment)Helpful Religious Practices: Surveyor, minerals, Life Story Sharing   Frequency of Communion: DailyIssues RaisedRaised by Patient: Other (Comment)Raised by Family: Other (Comment)   Spiritual Interventions:Spiritual Intervention Index**Date of Spiritual Visit: December 06, 2021Visit Type: New PatientIntervention Type: Crisis CallCrisis Call Type: Adult TraumaCrisis Call Location: Emergency DepartmentResponding Chaplain: Hospital PriestConsulted With: Nurse, Other (Comment)Type of Ritual/Sacrament: BlessingDate of Blessing Given: 2021/12/06Spiritual/Religious Support Provided: Companionship, Introduction to [employer], Prayer, Spiritual Support, Family SupportOutcome: OUTCOMES: Expressed Spiritual/Religious Resources or Distress: Expressed gratitude, Expressed feeling at peace OUTCOMES: Expressed Emotional Resources or Distress: Decreased anxiety, Expressed grief OUTCOMES: Progressed Spiritually: Knowledgeable about services of the Dept. of Spiritual Care, Knowledgeable about complimentary services, Received desired ritual or practice       Plan: Spiritual care support will be available if needed    Total Consult Time: 25 minutes	Signed: Chaplain Fr. Percival Spanish, [phone], Sheral Flow - Fri 11:30 pm - 8:00 amAfter 8:00 AM smartweb oncall chaplain 12-06-217:21 AM

## 2020-07-06 NOTE — Other
Called patient's sister, [name]. Left a VM to offer my condolences. Pollyann Savoy office phone number if she needs anything.Peri Jefferson, PA-C11/05/2020 4:05 PM

## 2020-07-06 NOTE — Discharge Summary
[facility]    Med/Surg Discharge Summary    Patient Data:    Patient Name: Paul Hunter Admit date: 06/28/2020   Age: 69 y.o. 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 He was diuresed with not much improvement but with worsening AKI. He also had Toxic metabolic encephalopathy and multifactorial delirium due to hypoxia, renal failure. He needed to be placed in restraints since he was agitated and pulled out lines.  Head [place] showed pontine attentuations and no other acute abnormalities. Concern for brainstem mets and MRI Brain under anesthesia was attempted but he couldn't tolerate it. Extensive goals of care discussions were held by the primary team, palliative care and hospice teams with the intention of moving towards comfort measures last evening . IV Morphine with Ativan was begun. He  Passed away at 446 AM .    Inpatient Consultants and summary of recommendations:  Endocrinology, Urology, palliative Care, Renal      Pertinent Procedures or Surgeries: none        Pertinent lab findings and test results:   Objective:     Recent Labs   Lab 07/03/20  0534 07/03/20  0534 07/04/20  0450 07/04/20  0450 07/05/20  0644   WBC 36.5*  --  34.7*  --  28.6*   HGB 10.8*   < > 9.9*   < > 11.2*   HCT 33.3*   < > 30.3*   < > 31.3*   PLT 219   < > 240   < > 190    < > = values in this interval not displayed.    Recent Labs   Lab 06/30/20  0523 07/01/20  0430 07/03/20  0534   NEUTROPHILS 89.8* 91.0* 90.2*      Recent Labs   Lab 07/03/20  2201 07/03/20  2235 07/04/20  0450 07/04/20  0450 07/05/20  0644   NA 142  --  143  --  145*   K 4.2   < > 4.4   < > 3.7   CL 104   < > 105   < > 106   CO2 25   < > 25   < > 26   BUN 25*   < > 26*   < > 30*   CREATININE 1.34*   < > 1.33*   < > 1.46*   GLU 123*   < > 98   < > 104*   ANIONGAP 13   < > 13   < > 13    < > = values in this interval not displayed.    Recent Labs   Lab 07/01/20  0430 07/03/20  2201 07/04/20  0450 07/05/20  0644   CALCIUM  --  8.8  8.6* 8.6*   MG   < >  --  2.3  --    PHOS  --   --  3.2  --     < > = values in this interval not displayed.      Recent Labs   Lab 07/02/20  1043   ALT 58   AST 26   ALKPHOS 166*   BILITOT 0.3    No results for input(s): PTT, LABPROT, INR in the last 168 hours.     Culture Information:  No results for input(s): LABBLOO, LABURIN, LOWERRESPIRA in the last 168 hours.    Imaging:   Imaging results available in Epic            Physical Exam     Discharge vitals:   Temp:  [97.9 ?F (36.6 ?C)-99 ?F (37.2 ?C)] 98.9 ?F (37.2 ?C)  Pulse:  [49-103] 60  Resp:  [16-41] 32  BP: (92-126)/(46-79) 126/78  SpO2:  [94 %-100 %] 98 %  Device (Oxygen Therapy): nasal cannula  O2 Flow (L/min):  [4-10] 4       Discharge Physical Exam:  Physical Exam  No heart sounds or breathsounds  Allergies   No Known Allergies     PMH PSH   Past Medical History:   Diagnosis Date   ? Anxiety    ? CAD (coronary artery disease)    ? Diabetes mellitus (HC Code) (HC CODE)    ? Difficult intubation 05/22/2020    Patient with significant soft tissue in oropharynx. Glidescope intubation revealed anterior cords with floppy soft tissue obstruction. Patient will require video laryngoscopy for airway management going forward.   ? GERD (gastroesophageal reflux disease)    ? Hyperlipidemia    ? Hypertension    ? Hypertrophy of prostate without urinary obstruction and other lower urinary tract symptoms (LUTS)    ? Other testicular hypofunction    ? Spinal stenosis 09/28/2018   ? Tobacco abuse    ? Vitamin D deficiency       Past Surgical History:   Procedure Laterality Date   ? CAROTID STENT  08/28/2003   ? CHOLECYSTECTOMY  11/07/2016   ? KNEE SURGERY      torn meniscus   ? LAMINECTOMY  10/07/2008   ? LUMBAR DISC SURGERY      herniated disc        Social History Family History   Social History     Tobacco Use   ? Smoking status: Current Every Day Smoker     Types: Cigarettes   Substance Use Topics   ? Alcohol use: Not on file      No family history on file.         There was at total of approximately30 minutes spent on the discharge of this patient    Electronically Signed:  Coy Saunas, MD   2020-07-27   7:53 AM  Best Contact Information: [phone]

## 2020-07-06 NOTE — Plan of Care
Plan of Care Overview/ Patient Status    1900-0700Patient was minimally alert throughout shift. Barely opening eyes and unable to follow commands. VSS on 4 L Nc. Patient extremely restless with bilateral mitts in place. Trying to climb over the bed rail multiple times. Reorientation unsuccessful. Patient given IVP morphine and ativan with no effect. MD Balica called to bedside to assess patient at 0126 due to increased restlessness. Bilateral wrist restraints ordered. At 0440, MD Wilfred Lacy called to pronounce patient. This writer heard no heart or lung sounds. Pronounced at 0446. Family notified by MD Wilfred Lacy. Organ donation called by writer, declined, case number [id number]. Restraint documentation completed Q2hrs.

## 2020-07-07 ENCOUNTER — Ambulatory Visit: Admit: 2020-07-07 | Payer: PRIVATE HEALTH INSURANCE

## 2020-07-07 ENCOUNTER — Encounter: Admit: 2020-07-07 | Payer: PRIVATE HEALTH INSURANCE | Attending: Medical Oncology

## 2020-07-08 ENCOUNTER — Ambulatory Visit: Admit: 2020-07-08 | Payer: PRIVATE HEALTH INSURANCE | Attending: Adult Health

## 2020-07-11 ENCOUNTER — Encounter: Admit: 2020-07-11 | Payer: PRIVATE HEALTH INSURANCE | Attending: Ophthalmology

## 2020-07-14 ENCOUNTER — Encounter: Admit: 2020-07-14 | Payer: PRIVATE HEALTH INSURANCE

## 2020-07-14 ENCOUNTER — Encounter: Admit: 2020-07-14 | Payer: PRIVATE HEALTH INSURANCE | Attending: Medical Oncology

## 2020-07-15 NOTE — ED Provider Notes
RESIDENT  MEDICAL DECISION MAKINGPatient is a 69 y.o. male  Hx of HTN, T2DM, CAD s/p PCI x3 (2014) and recent history of Stage IV NSCLC (dx 06/2019), worsening CM (CAD vs ICI toxicity) and new bladder malignancy (dx 04/2020) who was recently admitted 10/1-10/4/21 for presyncopal episode believed 2/2 NSTEMI (ICI Myocarditis vs iCM). He presents today as MEDICAL PATIENT for sudden onset SOB that started at 8PM while at rest. Per EMS, patient on intial examination was sat 93% on 2L (no home O2 use) , poor air movement initially. Given DUoneb x 2 and transient CPAP pre-hospital with improvement of WOB  and audible wheezing on repeatEXAMOn arrival patient with moderate WOB on NRB, weaned to room arir with sat 90%. Lungs w/ faint wheezing, b/l crackles, rrr abd distended, NT,4+ symmetric pitting edema to b/l LEs. ZOX:WRUEAVWU, but not limited to ACS  w/ pulm edema vs Myocarditis w/ Reduced EF. Consider PNA, COPD Exacerbation, PE PLAN/COURSEGiven findings discussed above, patient is currently ill-appearing and requires Additional Workup and Likely Admission  with the following plan:- CBC CMP Trop x2 - EKG, CXR- Bedside Echo - Lasix 80mg  x 1Updates/Reassessment:Trop +, EKG w/o ischemic change. Will send Trop T and d/w cardiology. Workup c/f volume overload state. Anticipate admission for ongoing care. Dimer + ---> will order CTA Cardiology w/ no recc for heparin, likely 2/2 myocarditis iso ICI. Trop T flat vs AM draw 0.03-->0.04. CTA negative for PEEvaluated and discussed with Dr. Deno Etienne  Management plan and disposition collaboratively agreed on except as documented in attending physician addendum.Kem Boroughs, [facility]- Emergency Medicine Available on MHBPlease contact covering provider with questionsHistoryChief Complaint Patient presents with ? Shortness of Breath   SOB with pitting edema, placed on cpap with albuterol nebs by EMS with some improvement.   The history is provided by the patient and the EMS personnel. OtherThis is a new problem. The current episode started 3 to 5 hours ago. The problem occurs constantly. The problem has been rapidly worsening. Associated symptoms include shortness of breath. Pertinent negatives include no chest pain. Nothing aggravates the symptoms. Nothing relieves the symptoms.  Past Medical History: Diagnosis Date ? Anxiety  ? CAD (coronary artery disease)  ? Diabetes mellitus (HC Code) (HC CODE)  ? Difficult intubation 05/22/2020  Patient with significant soft tissue in oropharynx. Glidescope intubation revealed anterior cords with floppy soft tissue obstruction. Patient will require video laryngoscopy for airway management going forward. ? GERD (gastroesophageal reflux disease)  ? Hyperlipidemia  ? Hypertension  ? Hypertrophy of prostate without urinary obstruction and other lower urinary tract symptoms (LUTS)  ? Other testicular hypofunction  ? Spinal stenosis 09/28/2018 ? Tobacco abuse  ? Vitamin D deficiency  Past Surgical History: Procedure Laterality Date ? CAROTID STENT  08/28/2003 ? CHOLECYSTECTOMY  11/07/2016 ? KNEE SURGERY    torn meniscus ? LAMINECTOMY  10/07/2008 ? LUMBAR DISC SURGERY    herniated disc No family history on file.Social History Socioeconomic History ? Marital status: Divorced   Spouse name: Not on file ? Number of children: Not on file ? Years of education: Not on file ? Highest education level: Not on file Tobacco Use ? Smoking status: Current Every Day Smoker   Types: Cigarettes Social History Narrative  As of 2021: Was living?in [place]?and moved back to [place] to be near family in 2021. He moved into his niece's in-law apartment in [place] while undergoing cancer treatment. ?He is currently self sufficient and independent.  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  -[facility] coming for med management and HHA assistance  -family very involved  -he is divorced and has 1 daughter with a 12 month old who lives 30 minutes away and a son with a 13 month old in [place]    Smoking hx 76 PYH  -current 1 PPD  -meeting with smoking cessation  EtOH - not much   ED Other Social History E-cigarette/Vaping Substances E-cigarette/Vaping Devices Review of Systems Constitutional: Positive for diaphoresis. Negative for chills and fever. HENT: Negative for rhinorrhea and sore throat.  Eyes: Negative for visual disturbance. Respiratory: Positive for chest tightness, shortness of breath and wheezing. Negative for cough.  Cardiovascular: Positive for leg swelling. Negative for chest pain. Gastrointestinal: Negative for nausea and vomiting. Genitourinary: Negative for decreased urine volume and dysuria. Skin: Negative for rash. All other systems reviewed and are negative. Physical ExamED Triage Vitals [06/16/20 2308]BP: 130/79Pulse: 73Pulse from  O2 sat: n/aResp: 20Temp: (!) 96.9 ?F (36.1 ?C)Temp src: TemporalSpO2: 97 % BP 123/76  - Pulse (!) 56  - Temp 97.8 ?F (36.6 ?C) (Oral)  - Resp 16  - Ht 6' (1.829 m)  - Wt 133.6 kg  - SpO2 96%  - BMI 39.95 kg/m? Physical ExamHENT:    Head: Normocephalic. Eyes:    Extraocular Movements: Extraocular movements intact. Cardiovascular:    Rate and Rhythm: Normal rate and regular rhythm. Pulmonary:    Effort: Tachypnea and respiratory distress present.    Breath sounds: Examination of the right-upper field reveals rales. Examination of the left-upper field reveals rales. Examination of the right-middle field reveals wheezing and rales. Examination of the left-middle field reveals wheezing and rales. Examination of the right-lower field reveals rales. Examination of the left-lower field reveals rales. Wheezing and rales present. Abdominal:    Tenderness: There is no abdominal tenderness. Musculoskeletal:    Right lower leg: Edema present.    Left lower leg: Edema present. Skin:   General: Skin is warm and dry. Neurological:    General: No focal deficit present.    Mental Status: He is alert. Psychiatric:       Behavior: Behavior normal.  ProceduresProcedures ED COURSEReviewed previous: previous chartInterpreted by ED Provider: labs and ECGPatient Reevaluation: Attending Supervised: ResidentI saw and examined the patient. I agree with the findings and plan of care as documented in the resident's note. Of note Presents with shortness of breath; Plan: labs, ekg, cxrRichard Greig Castilla TaylorPatient progress: stableClinical Impressions as of Jul 15 801 Shortness of breath Hypervolemia, unspecified hypervolemia type Elevated troponin Other subacute myocarditis Coronary artery disease involving native coronary artery of native heart with angina pectoris [facility] Code) [facility] CODE)  ED DispositionAdmit Kem Boroughs, MDResident10/22/21 0558 Cyndia Diver, MD11/19/21 [phone]

## 2020-07-18 ENCOUNTER — Encounter: Admit: 2020-07-18 | Payer: PRIVATE HEALTH INSURANCE | Attending: Vascular and Interventional Radiology

## 2020-07-20 ENCOUNTER — Ambulatory Visit: Admit: 2020-07-20 | Payer: PRIVATE HEALTH INSURANCE | Attending: Clinical

## 2020-07-25 ENCOUNTER — Encounter: Admit: 2020-07-25 | Payer: PRIVATE HEALTH INSURANCE | Attending: Vascular and Interventional Radiology

## 2020-07-26 ENCOUNTER — Ambulatory Visit: Admit: 2020-07-26 | Payer: PRIVATE HEALTH INSURANCE

## 2020-07-27 NOTE — Death Pronouncement
Death NoteI was called to Paul Hunter's bedside for evaluation of asystole. On exam, he was apneic,  unresponsive to painful stimulation, pupils were fixed and dilated, and he had no cardiac or respiratory sounds on auscultation.Hospital [facility] [address] [employer] notified: YesSocial work notified: Chief Operating Officer Physician Notified: YesAttending Physician Name: Medical laboratory scientific officer Provider Notified: [facility] Outpatient Provider Not Notified: None on filePhysician's Post Mortem ChecklistPronounced By: Paul Hunter, MDDate pronounced death: 11-21-2021Time pronounced death: 0446Date of Actual Death: November 21, 2021Time of Actual Death: 0446Family present at time of death: NoName of Next of Kin Notified of Death: Paul LindaRelationship of Next of Kin: Financial controller Next of Kin: Paul Hunter, MDPost-Mortem Examination (Medical Examiner & Autopsy Information) - Please refer to postmortem checklist for most updated information related to autopsy and medical examiner case information.Organ Donation (NEDS) - Please refer to postmortem checklist for most updated information related to NEDS notification and status of organ and tissue donation.[facility] notified, case nr [id number]. Declined for organ/tissue Paul Schmidt, MD

## 2020-07-27 DEATH — deceased

## 2020-08-01 ENCOUNTER — Encounter: Admit: 2020-08-01 | Payer: PRIVATE HEALTH INSURANCE | Attending: Hematology

## 2020-08-12 ENCOUNTER — Ambulatory Visit: Admit: 2020-08-12 | Payer: PRIVATE HEALTH INSURANCE | Attending: Internal Medicine

## 2020-08-18 ENCOUNTER — Encounter: Admit: 2020-08-18 | Payer: PRIVATE HEALTH INSURANCE | Attending: Vascular and Interventional Radiology

## 2020-09-01 ENCOUNTER — Ambulatory Visit: Admit: 2020-09-01 | Payer: PRIVATE HEALTH INSURANCE | Attending: Internal Medicine

## 2020-09-17 NOTE — Other
[facility]    [facility]     Endocrine Consult Note     Consult Information:     Endocrine Consultation requested by: Katy Apo, MD  Reason for consultation: Evaluation of panhypopit  Source of Information: Patient    Presentation History:     Mr. 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 Endocrinology was consulted for evaluation of panhypopit.     He is currently on Prednisone 100mg , which was prescribed by his cardiologist for myocarditis. He was started on pembro on 11/28/2019, now s/p 4 cycles. He had a Cardiac MRI on 03/2020 which was consistent with myocarditis and he was started on prednisone 130mg  initially, now currently on 100mg . His taper has been driven by the cardiology team outpatient. His most recent TSH was 2.3 (06/02/2020), however no other hormonal evaluation. Had a [place] head without contrast on 06/22/2020 which was negative for an acute pathology. No recent brain MRI to evaluate the pituitary. Had abdominal imaging which was negative for mets to the adrenals. At the moment no electrolytes abnormalities and his glucose  Has been between 90s-150. His urine output is not consistent with DI.     PMHx/Medications/Allergies:     PMHx: Past Medical History  has a past medical history of Anxiety, CAD (coronary artery disease), Diabetes mellitus (HC Code) (HC CODE), Difficult intubation (05/22/2020), GERD (gastroesophageal reflux disease), Hyperlipidemia, Hypertension, Hypertrophy of prostate without urinary obstruction and other lower urinary tract symptoms (LUTS), Other testicular hypofunction, Spinal stenosis (09/28/2018), Tobacco abuse, and Vitamin D deficiency.    PSurgHx: Past Surgical History  has a past surgical history that includes Carotid stent (08/28/2003); Cholecystectomy (11/07/2016); Knee surgery; Laminectomy (10/07/2008); and Lumbar disc surgery.    Family Hx: Family History family history is not on file.     Medications:   Prior to Admission Medications   Medications Prior to Admission   Medication Sig Dispense Refill Last Dose    albuterol sulfate 90 mcg/actuation HFA aerosol inhaler Inhale 2 puffs into the lungs every 6 (six) hours as needed for wheezing or shortness of breath. 6.7 g 0     aspirin 81 MG EC tablet Take 81 mg by mouth daily.       atorvastatin (LIPITOR) 80 mg tablet Take 1 tablet (80 mg total) by mouth nightly. 30 tablet 6     calcium carbonate-vitamin D3 500 mg(1,250mg ) -400 unit per tablet Take 1 tablet by mouth 2 (two) times daily. 60 tablet 2     carboxymethylcellulose (REFRESH PLUS) 0.5 % ophthalmic solution in dropperette Place 1 drop into both eyes 3 (three) times daily as needed.       clopidogreL (PLAVIX) 75 mg tablet Take 75 mg by mouth daily.       escitalopram oxalate (LEXAPRO) 10 mg tablet Take 1 tablet (10 mg total) by mouth nightly. 90 tablet 3     fexofenadine (ALLEGRA) 180 mg tablet Take 180 mg by mouth daily as needed.        furosemide (LASIX) 40 mg tablet Take 40 mg by mouth daily.       gabapentin (  NEURONTIN) 300 mg capsule Take 2 capsules (600 mg total) by mouth nightly. 60 capsule 0     lisinopriL (PRINIVIL,ZESTRIL) 2.5 mg tablet Take 1 tablet (2.5 mg total) by mouth daily. Hold until follow-up with Cardiology 30 tablet 11     LORazepam (ATIVAN) 0.5 mg tablet Take 1 tablet (0.5 mg total) by mouth every 6 (six) hours as needed for anxiety. 15 tablet 0     metoprolol succinate XL (TOPROL-XL) 25 mg 24 hr tablet Take 0.5 tablets (12.5 mg total) by mouth daily. Hold until follow-up with Cardiology 30 tablet 0 mycophenolate mofetil (CELLCEPT) 500 mg tablet Take 2 tablets (1 g total) by mouth 2 (two) times daily. 120 tablet 0     nicotine (NICODERM CQ) 21 mg transdermal patch APPLY 1 PATCH DAILY. ROTATE PATCH SITES. LEAVE ON FOR 24 HOURS UNLESS OTHERWISE DIRECTED.       nicotine polacrilex (COMMIT) 2 mg lozenge Place 1 lozenge (2 mg total) inside cheek every 2 (two) hours as needed for smoking cessation. 108 each 2     oxybutynin XL (DITROPAN-XL) 5 mg 24 hr tablet Take 1 tablet (5 mg total) by mouth daily. 90 tablet 0     oxyCODONE (OXYCONTIN) 10 mg 12 hr extended release tablet Take 1 tablet (10 mg total) by mouth every 12 (twelve) hours. 60 tablet 0     oxyCODONE (ROXICODONE) 5 mg Immediate Release tablet Take 1 tablet (5 mg total) by mouth every 3 (three) hours as needed for up to 10 doses. 200 tablet 0     pantoprazole (PROTONIX) 40 mg tablet Take 1 tablet (40 mg total) by mouth daily. 30 tablet 2     polyethylene glycol (MIRALAX) 17 gram packet Take 1 packet (17 g total) by mouth daily as needed for constipation. Mix in 8 ounces of water, juice, soda, coffee or tea prior to taking. 14 each 0     [START ON 08/09/2020] predniSONE (DELTASONE) 10 mg tablet Take 3 tablets (30 mg total) by mouth daily for 7 days, THEN 2 tablets (20 mg total) daily for 7 days, THEN 1 tablet (10 mg total) daily for 7 days, THEN 0.5 tablets (5 mg total) daily for 7 days, THEN 0.5 tablets (5 mg total) daily for 7 days. Take with food.. 49 tablet 0     prochlorperazine (COMPAZINE) 10 mg tablet Take 1 tablet (10 mg total) by mouth every 6 (six) hours as needed for nausea. 30 tablet 5     senna-docusate (SENNA-PLUS) 8.6-50 mg tablet Take 1 tablet by mouth nightly. 30 tablet 0     simethicone (MYLICON) 80 mg chewable tablet Take 1 tablet (80 mg total) by mouth every 6 (six) hours as needed for flatulence. 30 tablet 0     sulfamethoxazole-trimethoprim (BACTRIM DS;CO-TRIMOXAZOLE DS) 800-160 mg per tablet Take 1 tablet by mouth Every Monday, Wednesday, and Friday. 28 tablet 0     tamsulosin (FLOMAX) 0.4 mg 24 hr capsule Take 1 capsule (0.4 mg total) by mouth nightly. 30 capsule 2     torsemide (DEMADEX) 20 mg tablet Take 1 tablet (20 mg total) by mouth daily. 30 tablet 0     belladonna alkaloids-opium (B&O SUPPRETTES) 16.2-30 mg suppository Place 1 suppository rectally 2 (two) times daily as needed for bladder spasm. 12 suppository 0     walker Misc Use as directed. 1 each 0        Allergies:    Social: Social History  reports that he has been smoking cigarettes.  He does not have any smokeless tobacco history on file. No history on file for alcohol use and drug use.  - Alcohol  - Tobacco  - Illicit Drug    Endocrine Medications:     Inpatient Medications:  I have reviewed the patient's current medication orders.    Current Facility-Administered Medications:     acetaminophen (TYLENOL) tablet 650 mg, 650 mg, Oral, Q6H PRN, Axel Filler I, MD, 650 mg at 06/29/20 0449    aspirin EC delayed release tablet 81 mg, 81 mg, Oral, Daily, Adekolu, [name], MD, 81 mg at 06/29/20 1006    cetirizine (ZyrTEC) tablet 5 mg, 5 mg, Oral, Daily, Adekolu, [name], MD, 5 mg at 06/29/20 1007    clopidogreL (PLAVIX) tablet 75 mg, 75 mg, Oral, Daily, Adekolu, [name], MD, 75 mg at 06/29/20 1008    doxycycline TAB/CAP 100 mg, 100 mg, Oral, Q12H, Adekolu, [name], MD, 100 mg at 06/29/20 1009    escitalopram oxalate (LEXAPRO) tablet 10 mg, 10 mg, Oral, Nightly, Adekolu, [name], MD, 10 mg at 06/28/20 2149    gabapentin (NEURONTIN) capsule 600 mg, 600 mg, Oral, Nightly, Adekolu, [name], MD, 600 mg at 06/28/20 2149    heparin (porcine) injection 5,000 Units, 5,000 Units, Subcutaneous, Q8H, Adekolu, [name], MD, 5,000 Units at 06/29/20 1610    ipratropium-albuteroL (DUO-NEB) 0.5 mg-3 mg(2.5 mg base)/3 mL nebulizer solution 3 mL, 3 mL, Nebulization, Q6H WA, Adekolu, [name], MD, 3 mL at 06/29/20 0838    LORazepam (ATIVAN) tablet 0.5 mg, 0.5 mg, Oral, Q6H PRN, Adekolu, [name], MD    mycophenolate mofetil (CELLCEPT) tablet 1 g, 1 g, Oral, BID, Adekolu, [name], MD, 1 g at 06/29/20 0938    oxybutynin XL (DITROPAN-XL) 24 hr tablet 5 mg, 5 mg, Oral, Daily, Adekolu, [name], MD, 5 mg at 06/29/20 1009    oxyCODONE (OxyCONTIN) 12 hr tablet 10 mg, 10 mg, Oral, Q12H, Adekolu, [name], MD, 10 mg at 06/29/20 0648    pantoprazole (PROTONIX) EC tablet 40 mg, 40 mg, Oral, Daily, Adekolu, [name], MD, 40 mg at 06/29/20 1010    piperacillin-tazobactam (ZOSYN) 3.375 g in sodium chloride 0.9% 50 mL (mini-bag plus), 3.375 g, Intravenous, Q6H, Adekolu, [name], MD, Last Rate: 16.67 mL/hr at 06/29/20 0648, 3.375 g at 06/29/20 0648    polyethylene glycol (MIRALAX) packet 17 g, 17 g, Oral, DAILY PRN, Adekolu, [name], MD    predniSONE (DELTASONE) tablet 100 mg, 100 mg, Oral, Daily, Adekolu, [name], MD, 100 mg at 06/29/20 1010    prochlorperazine (COMPAZINE) tablet 10 mg, 10 mg, Oral, Q6H PRN, Adekolu, [name], MD    rosuvastatin (CRESTOR) tablet 40 mg, 40 mg, Oral, Daily, Adekolu, [name], MD, 40 mg at 06/29/20 1010    simethicone (MYLICON) chewable tablet 80 mg, 80 mg, Oral, Q6H PRN, Adekolu, [name], MD    sodium chloride 0.9 % flush 3 mL, 3 mL, IV Push, Q8H, Adekolu, [name], MD, 3 mL at 06/29/20 0648    sodium chloride 0.9 % flush 3 mL, 3 mL, IV Push, PRN for Line Care, Axel Filler I, MD    tamsulosin [facility]) 24 hr capsule 0.4 mg, 0.4 mg, Oral, Nightly, Adekolu, [name], MD, 0.4 mg at 06/28/20 2150    Vancomycin MAR Level, , Intravenous, Once **AND** Vancomycin, random     ([facility]), , , Once, Advani, [name], MD    Vancomycin Therapy Placeholder, , Intravenous, placeholder,  Link Snuffer, MD    Review of Systems:   12 point ROS completed and negative except as mentioned above.     Endocrine Physical Exam:     Vitals:  Vitals:    06/29/20 0040 06/29/20 0446 06/29/20 0645 06/29/20 0900   BP: 105/61 (!) 103/58 127/74 103/60   Pulse: 67 87 70 60   Resp: 15 16  17    Temp: 97.3 ?F (36.3 ?C) 97.6 ?F (36.4 ?C)  97.9 ?F (36.6 ?C)   TempSrc: Oral Oral  Oral   SpO2: 95% 95%  94%   Weight:           I/O's:  I/O last 3 completed shifts:  In: -   Out: 720 [Urine:720]  I/O this shift:  In: -   Out: 150 [Urine:150]    Gross Totals (Last 24 hours) at 06/29/2020 1139  Last data filed at 06/29/2020 1000  Intake    Output 870 ml   Net -870 ml       GENERAL: NAD, obese  HEENT: Head is normocephalic and atraumatic. PERRLA, EOMNo evidence of eyebrow hair wasting. No macrognathia or macroglossia. No myxedema facies.   NECK: Supple. No JVD. No Thyromegaly.   LUNGS: Clear to auscultation bilaterally.   HEART: Regular rate and rhythm without murmur.   ABDOMEN: Soft, nontender, and nondistended.  Active bowel sounds.   EXTREMITIES: +2 pitting edema   NEUROLOGIC: Muscle strength 5/5 in all extremities.  PSYCHIATRIC: Appropriate affect.  SKIN: Multiple bruising throughout his body, in his arms and in his belly    Review of Labs/Diagnostics:     Lab Review:    Component      Latest Ref Rng & Units 06/29/2020   Sodium      136 - 144 mmol/L 138   Potassium      3.3 - 5.3 mmol/L 3.8   Glucose      70 - 100 mg/dL 95   Creatinine      1.61 - 1.30 mg/dL 0.96 (H)   Calcium      8.8 - 10.2 mg/dL 8.5 (L)   eGFR (Afr Amer)      >60 mL/min/1.51m2 33   eGFR (NON African-American)      >60 mL/min/1.10m2 27     Component      Latest Ref Rng & Units 06/02/2020   Thyroid Stimulating Hormone, 3rd Gen.      See Comment ?IU/mL 2.350            Impression:     Mr. 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 Endocrinology was consulted for evaluation of panhypopit in the setting of hypotension.    From the endocrine standpoint it is hard to evaluate the pituitary adrenal axis because he is with such high doses of steroids. There's no utility in measuring his cortisol level or ACTH while on prednisone. His taper is manage by his cardiologist and will defer to them moving forward in terms of the taper, since it is for myocarditis. The other axis that can be evaluated is the pituitary-thyroid axis. His TSH is normal, and has been  normal for a while, would like to see the FT4 to further assess the axis. The other pituitary axis are usually not measured while a patient is acutely ill. At the moment, the etiology of his hypotension cannot be assess through an evaluation of the pituitary hormones, and our 2 differentials would be adrenal insufficiency, which he is already in such high doses of prednisone (20x more than what an individual needs) and thyroid dysfunction.     The other recommendation that we have is to monitor patient sugars and diet while on high dose steroids, and would recommend staying away from sugary meals/snacks.     Recommendations:     Send FT4 to finish workup  No further endocrine workup  Prednisone taper per Cardioloy    Will sign off at the moment, please re-consult as needed. Patient discussed with attending. Please refer to attending's addendum for final recommendations.      Electronically Signed by Kirke Corin, MD   Endocrine Fellow - June 29, 2020  Endocrine Pager: [phone]
